# Patient Record
Sex: Male | Born: 1950 | Race: White | Hispanic: No | Smoking: Former smoker
Health system: Southern US, Community
[De-identification: ages and names within clinical notes are randomized; demographics above are authoritative.]

## PROBLEM LIST (undated history)

## (undated) DIAGNOSIS — I2510 Atherosclerotic heart disease of native coronary artery without angina pectoris: Secondary | ICD-10-CM

## (undated) DIAGNOSIS — M545 Low back pain, unspecified: Secondary | ICD-10-CM

## (undated) DIAGNOSIS — D751 Secondary polycythemia: Secondary | ICD-10-CM

## (undated) DIAGNOSIS — D471 Chronic myeloproliferative disease: Secondary | ICD-10-CM

## (undated) DIAGNOSIS — I10 Essential (primary) hypertension: Secondary | ICD-10-CM

## (undated) DIAGNOSIS — T7840XA Allergy, unspecified, initial encounter: Secondary | ICD-10-CM

## (undated) DIAGNOSIS — G8929 Other chronic pain: Secondary | ICD-10-CM

## (undated) DIAGNOSIS — M542 Cervicalgia: Secondary | ICD-10-CM

## (undated) DIAGNOSIS — G4733 Obstructive sleep apnea (adult) (pediatric): Secondary | ICD-10-CM

## (undated) DIAGNOSIS — M5030 Other cervical disc degeneration, unspecified cervical region: Secondary | ICD-10-CM

## (undated) DIAGNOSIS — I739 Peripheral vascular disease, unspecified: Secondary | ICD-10-CM

## (undated) HISTORY — DX: Other chronic pain: G89.29

## (undated) HISTORY — DX: Chronic myeloproliferative disease: D47.1

## (undated) HISTORY — PX: OTHER SURGICAL HISTORY: SHX169

## (undated) HISTORY — DX: Atherosclerotic heart disease of native coronary artery without angina pectoris: I25.10

## (undated) HISTORY — DX: Peripheral vascular disease, unspecified: I73.9

## (undated) HISTORY — DX: Secondary polycythemia: D75.1

## (undated) HISTORY — PX: JOINT REPLACEMENT: SHX530

## (undated) HISTORY — DX: Other cervical disc degeneration, unspecified cervical region: M50.30

## (undated) HISTORY — DX: Cervicalgia: M54.2

## (undated) HISTORY — DX: Essential (primary) hypertension: I10

## (undated) HISTORY — DX: Low back pain, unspecified: M54.50

## (undated) HISTORY — PX: SPINE SURGERY: SHX786

## (undated) HISTORY — DX: Low back pain: M54.5

## (undated) HISTORY — DX: Obstructive sleep apnea (adult) (pediatric): G47.33

## (undated) HISTORY — DX: Allergy, unspecified, initial encounter: T78.40XA

---

## 1999-08-22 ENCOUNTER — Encounter: Payer: Self-pay | Admitting: Emergency Medicine

## 1999-08-22 ENCOUNTER — Emergency Department (HOSPITAL_COMMUNITY): Admission: EM | Admit: 1999-08-22 | Discharge: 1999-08-22 | Payer: Self-pay | Admitting: Emergency Medicine

## 2006-09-21 ENCOUNTER — Encounter: Payer: Self-pay | Admitting: Cardiology

## 2006-09-27 ENCOUNTER — Encounter: Payer: Self-pay | Admitting: Cardiology

## 2006-09-28 ENCOUNTER — Encounter: Payer: Self-pay | Admitting: Cardiology

## 2006-10-14 HISTORY — PX: OTHER SURGICAL HISTORY: SHX169

## 2007-08-14 DIAGNOSIS — IMO0002 Reserved for concepts with insufficient information to code with codable children: Secondary | ICD-10-CM | POA: Insufficient documentation

## 2008-02-13 HISTORY — PX: OTHER SURGICAL HISTORY: SHX169

## 2008-02-19 ENCOUNTER — Encounter: Payer: Self-pay | Admitting: Cardiology

## 2008-03-21 ENCOUNTER — Ambulatory Visit: Payer: Self-pay | Admitting: Hematology and Oncology

## 2008-03-27 LAB — COMPREHENSIVE METABOLIC PANEL
ALT: 33 U/L (ref 0–53)
BUN: 17 mg/dL (ref 6–23)
CO2: 24 mEq/L (ref 19–32)
Calcium: 9 mg/dL (ref 8.4–10.5)
Chloride: 102 mEq/L (ref 96–112)
Creatinine, Ser: 1.03 mg/dL (ref 0.40–1.50)

## 2008-03-27 LAB — CBC WITH DIFFERENTIAL/PLATELET
Basophils Absolute: 0.1 10*3/uL (ref 0.0–0.1)
HCT: 54.2 % — ABNORMAL HIGH (ref 38.7–49.9)
HGB: 17.3 g/dL — ABNORMAL HIGH (ref 13.0–17.1)
MONO#: 0.5 10*3/uL (ref 0.1–0.9)
NEUT%: 79.9 % — ABNORMAL HIGH (ref 40.0–75.0)
WBC: 15 10*3/uL — ABNORMAL HIGH (ref 4.0–10.0)
lymph#: 1.8 10*3/uL (ref 0.9–3.3)

## 2008-03-27 LAB — LACTATE DEHYDROGENASE: LDH: 159 U/L (ref 94–250)

## 2008-04-17 LAB — CBC WITH DIFFERENTIAL/PLATELET
BASO%: 0.4 % (ref 0.0–2.0)
HCT: 49.9 % (ref 38.7–49.9)
MCHC: 33.2 g/dL (ref 32.0–35.9)
MONO#: 0.3 10*3/uL (ref 0.1–0.9)
RBC: 5.38 10*6/uL (ref 4.20–5.71)
WBC: 10.6 10*3/uL — ABNORMAL HIGH (ref 4.0–10.0)
lymph#: 1.4 10*3/uL (ref 0.9–3.3)

## 2008-04-18 ENCOUNTER — Encounter: Payer: Self-pay | Admitting: Internal Medicine

## 2008-05-27 ENCOUNTER — Ambulatory Visit: Payer: Self-pay | Admitting: Hematology and Oncology

## 2008-06-06 ENCOUNTER — Encounter: Payer: Self-pay | Admitting: Internal Medicine

## 2008-06-06 LAB — CBC WITH DIFFERENTIAL/PLATELET
BASO%: 0.5 % (ref 0.0–2.0)
LYMPH%: 22.5 % (ref 14.0–49.0)
MCHC: 33.8 g/dL (ref 32.0–36.0)
MONO#: 0.2 10*3/uL (ref 0.1–0.9)
MONO%: 3 % (ref 0.0–14.0)
Platelets: 225 10*3/uL (ref 140–400)
RBC: 3.74 10*6/uL — ABNORMAL LOW (ref 4.20–5.82)
RDW: 26.4 % — ABNORMAL HIGH (ref 11.0–14.6)
WBC: 6.1 10*3/uL (ref 4.0–10.3)

## 2008-06-06 LAB — CONVERTED CEMR LAB
ALT: 20 units/L
AST: 18 units/L
Albumin: 4.1 g/dL
BUN: 25 mg/dL
CO2: 25 meq/L
Calcium: 8.9 mg/dL
Chloride: 105 meq/L
Cholesterol: 139 mg/dL
Creatinine, Ser: 1 mg/dL
Glucose, Bld: 95 mg/dL
HDL: 35 mg/dL
LDL Cholesterol: 89 mg/dL
Potassium: 4.3 meq/L
Sodium: 136 meq/L
Total Bilirubin: 1.4 mg/dL
Total Protein: 6.2 g/dL
Triglyceride fasting, serum: 84 mg/dL

## 2008-07-22 ENCOUNTER — Ambulatory Visit: Payer: Self-pay | Admitting: Hematology and Oncology

## 2008-07-25 LAB — CBC WITH DIFFERENTIAL/PLATELET
EOS%: 2.9 % (ref 0.0–7.0)
Eosinophils Absolute: 0.2 10*3/uL (ref 0.0–0.5)
LYMPH%: 22.8 % (ref 14.0–49.0)
MCH: 42.4 pg — ABNORMAL HIGH (ref 27.2–33.4)
MCHC: 35 g/dL (ref 32.0–36.0)
MCV: 120.9 fL — ABNORMAL HIGH (ref 79.3–98.0)
MONO%: 2.4 % (ref 0.0–14.0)
Platelets: 254 10*3/uL (ref 140–400)
RBC: 2.65 10*6/uL — ABNORMAL LOW (ref 4.20–5.82)

## 2008-09-05 ENCOUNTER — Encounter: Payer: Self-pay | Admitting: Internal Medicine

## 2008-09-05 ENCOUNTER — Ambulatory Visit: Payer: Self-pay | Admitting: Hematology and Oncology

## 2008-09-05 LAB — CBC WITH DIFFERENTIAL/PLATELET
Basophils Absolute: 0.1 10*3/uL (ref 0.0–0.1)
EOS%: 2.8 % (ref 0.0–7.0)
Eosinophils Absolute: 0.2 10*3/uL (ref 0.0–0.5)
HGB: 12.7 g/dL — ABNORMAL LOW (ref 13.0–17.1)
LYMPH%: 23.1 % (ref 14.0–49.0)
MCH: 44.7 pg — ABNORMAL HIGH (ref 27.2–33.4)
MCV: 124.7 fL — ABNORMAL HIGH (ref 79.3–98.0)
MONO%: 3.5 % (ref 0.0–14.0)
NEUT#: 5 10*3/uL (ref 1.5–6.5)
NEUT%: 69.5 % (ref 39.0–75.0)
Platelets: 372 10*3/uL (ref 140–400)

## 2008-10-14 ENCOUNTER — Ambulatory Visit: Payer: Self-pay | Admitting: Hematology and Oncology

## 2008-10-29 DIAGNOSIS — M545 Low back pain: Secondary | ICD-10-CM

## 2008-10-30 ENCOUNTER — Ambulatory Visit: Payer: Self-pay | Admitting: Internal Medicine

## 2008-10-30 DIAGNOSIS — G4733 Obstructive sleep apnea (adult) (pediatric): Secondary | ICD-10-CM | POA: Insufficient documentation

## 2008-10-30 DIAGNOSIS — I10 Essential (primary) hypertension: Secondary | ICD-10-CM

## 2008-10-30 DIAGNOSIS — D751 Secondary polycythemia: Secondary | ICD-10-CM | POA: Insufficient documentation

## 2008-10-30 DIAGNOSIS — I2510 Atherosclerotic heart disease of native coronary artery without angina pectoris: Secondary | ICD-10-CM | POA: Insufficient documentation

## 2008-10-30 LAB — CBC WITH DIFFERENTIAL/PLATELET
Basophils Absolute: 0 10*3/uL (ref 0.0–0.1)
EOS%: 3.1 % (ref 0.0–7.0)
HGB: 13.6 g/dL (ref 13.0–17.1)
MCH: 40.2 pg — ABNORMAL HIGH (ref 27.2–33.4)
MCV: 119.4 fL — ABNORMAL HIGH (ref 79.3–98.0)
MONO%: 3.4 % (ref 0.0–14.0)
NEUT#: 6 10*3/uL (ref 1.5–6.5)
RBC: 3.38 10*6/uL — ABNORMAL LOW (ref 4.20–5.82)
RDW: 14.6 % (ref 11.0–14.6)
lymph#: 1.4 10*3/uL (ref 0.9–3.3)

## 2008-11-13 HISTORY — PX: US ECHOCARDIOGRAPHY: HXRAD669

## 2008-11-14 ENCOUNTER — Encounter: Payer: Self-pay | Admitting: Internal Medicine

## 2008-11-26 ENCOUNTER — Ambulatory Visit: Payer: Self-pay | Admitting: Cardiology

## 2008-11-26 ENCOUNTER — Telehealth (INDEPENDENT_AMBULATORY_CARE_PROVIDER_SITE_OTHER): Payer: Self-pay | Admitting: *Deleted

## 2008-11-26 ENCOUNTER — Ambulatory Visit: Payer: Self-pay | Admitting: Hematology and Oncology

## 2008-11-26 DIAGNOSIS — E785 Hyperlipidemia, unspecified: Secondary | ICD-10-CM

## 2008-12-06 ENCOUNTER — Ambulatory Visit: Payer: Self-pay

## 2008-12-06 ENCOUNTER — Ambulatory Visit: Payer: Self-pay | Admitting: Cardiology

## 2008-12-06 ENCOUNTER — Encounter: Payer: Self-pay | Admitting: Cardiology

## 2008-12-06 ENCOUNTER — Encounter: Payer: Self-pay | Admitting: Internal Medicine

## 2008-12-06 LAB — CBC WITH DIFFERENTIAL/PLATELET
BASO%: 0.1 % (ref 0.0–2.0)
Basophils Absolute: 0 10*3/uL (ref 0.0–0.1)
EOS%: 2.8 % (ref 0.0–7.0)
HCT: 41.6 % (ref 38.4–49.9)
HGB: 14.5 g/dL (ref 13.0–17.1)
LYMPH%: 18 % (ref 14.0–49.0)
MCH: 40.9 pg — ABNORMAL HIGH (ref 27.2–33.4)
MCHC: 34.9 g/dL (ref 32.0–36.0)
NEUT%: 75.4 % — ABNORMAL HIGH (ref 39.0–75.0)
Platelets: 288 10*3/uL (ref 140–400)

## 2008-12-06 LAB — BASIC METABOLIC PANEL
BUN: 15 mg/dL (ref 6–23)
CO2: 23 mEq/L (ref 19–32)
Calcium: 9.4 mg/dL (ref 8.4–10.5)
Creatinine, Ser: 0.84 mg/dL (ref 0.40–1.50)

## 2008-12-11 ENCOUNTER — Telehealth: Payer: Self-pay | Admitting: Cardiology

## 2008-12-11 LAB — CONVERTED CEMR LAB
BUN: 13 mg/dL (ref 6–23)
CO2: 27 meq/L (ref 19–32)
Calcium: 9.1 mg/dL (ref 8.4–10.5)
Chloride: 102 meq/L (ref 96–112)
Cholesterol: 171 mg/dL (ref 0–200)
Creatinine, Ser: 0.9 mg/dL (ref 0.4–1.5)
GFR calc non Af Amer: 91.76 mL/min (ref >60)
Glucose, Bld: 92 mg/dL (ref 70–99)
HDL: 40.5 mg/dL (ref >39.00)
LDL Cholesterol: 113 mg/dL — ABNORMAL HIGH (ref 0–99)
Potassium: 4.4 meq/L (ref 3.5–5.1)
Sodium: 136 meq/L (ref 135–145)
Total CHOL/HDL Ratio: 4
Triglycerides: 89 mg/dL (ref 0.0–149.0)
VLDL: 17.8 mg/dL (ref 0.0–40.0)

## 2009-01-06 ENCOUNTER — Telehealth: Payer: Self-pay | Admitting: Internal Medicine

## 2009-01-07 ENCOUNTER — Ambulatory Visit: Payer: Self-pay | Admitting: Hematology and Oncology

## 2009-01-07 LAB — CBC WITH DIFFERENTIAL/PLATELET
Basophils Absolute: 0.1 10*3/uL (ref 0.0–0.1)
Eosinophils Absolute: 0.2 10*3/uL (ref 0.0–0.5)
HCT: 46.9 % (ref 38.4–49.9)
HGB: 15.9 g/dL (ref 13.0–17.1)
LYMPH%: 10.1 % — ABNORMAL LOW (ref 14.0–49.0)
MCHC: 33.8 g/dL (ref 32.0–36.0)
MONO#: 0.5 10*3/uL (ref 0.1–0.9)
NEUT#: 13.3 10*3/uL — ABNORMAL HIGH (ref 1.5–6.5)
NEUT%: 85.1 % — ABNORMAL HIGH (ref 39.0–75.0)
Platelets: 435 10*3/uL — ABNORMAL HIGH (ref 140–400)
WBC: 15.6 10*3/uL — ABNORMAL HIGH (ref 4.0–10.3)

## 2009-01-30 ENCOUNTER — Ambulatory Visit: Payer: Self-pay | Admitting: Internal Medicine

## 2009-01-30 LAB — CBC WITH DIFFERENTIAL/PLATELET
Eosinophils Absolute: 0.2 10*3/uL (ref 0.0–0.5)
LYMPH%: 15.3 % (ref 14.0–49.0)
MONO#: 0.2 10*3/uL (ref 0.1–0.9)
NEUT#: 8.2 10*3/uL — ABNORMAL HIGH (ref 1.5–6.5)
Platelets: 485 10*3/uL — ABNORMAL HIGH (ref 140–400)
RBC: 3.66 10*6/uL — ABNORMAL LOW (ref 4.20–5.82)
WBC: 10.1 10*3/uL (ref 4.0–10.3)
lymph#: 1.6 10*3/uL (ref 0.9–3.3)

## 2009-02-03 ENCOUNTER — Telehealth: Payer: Self-pay | Admitting: Internal Medicine

## 2009-02-14 ENCOUNTER — Telehealth: Payer: Self-pay | Admitting: Internal Medicine

## 2009-02-28 ENCOUNTER — Ambulatory Visit: Payer: Self-pay | Admitting: Hematology and Oncology

## 2009-02-28 LAB — CBC WITH DIFFERENTIAL/PLATELET
BASO%: 0.2 % (ref 0.0–2.0)
MCHC: 34.3 g/dL (ref 32.0–36.0)
MONO#: 0.4 10*3/uL (ref 0.1–0.9)
NEUT#: 7 10*3/uL — ABNORMAL HIGH (ref 1.5–6.5)
RBC: 3.93 10*6/uL — ABNORMAL LOW (ref 4.20–5.82)
RDW: 16 % — ABNORMAL HIGH (ref 11.0–14.6)
WBC: 8.9 10*3/uL (ref 4.0–10.3)
lymph#: 1.3 10*3/uL (ref 0.9–3.3)

## 2009-04-01 ENCOUNTER — Telehealth: Payer: Self-pay | Admitting: Internal Medicine

## 2009-04-09 ENCOUNTER — Ambulatory Visit: Payer: Self-pay | Admitting: Hematology and Oncology

## 2009-04-28 ENCOUNTER — Telehealth: Payer: Self-pay | Admitting: Internal Medicine

## 2009-04-30 ENCOUNTER — Ambulatory Visit: Payer: Self-pay | Admitting: Internal Medicine

## 2009-04-30 LAB — COMPREHENSIVE METABOLIC PANEL
ALT: 19 U/L (ref 0–53)
Albumin: 4.2 g/dL (ref 3.5–5.2)
CO2: 24 mEq/L (ref 19–32)
Calcium: 9 mg/dL (ref 8.4–10.5)
Chloride: 102 mEq/L (ref 96–112)
Creatinine, Ser: 0.87 mg/dL (ref 0.40–1.50)
Potassium: 3.8 mEq/L (ref 3.5–5.3)
Sodium: 138 mEq/L (ref 135–145)
Total Protein: 6.3 g/dL (ref 6.0–8.3)

## 2009-04-30 LAB — CBC WITH DIFFERENTIAL/PLATELET
BASO%: 0.4 % (ref 0.0–2.0)
HCT: 39.4 % (ref 38.4–49.9)
HGB: 13.6 g/dL (ref 13.0–17.1)
MCHC: 34.5 g/dL (ref 32.0–36.0)
MONO#: 0.1 10*3/uL (ref 0.1–0.9)
NEUT%: 73 % (ref 39.0–75.0)
RDW: 15.5 % — ABNORMAL HIGH (ref 11.0–14.6)
WBC: 4.8 10*3/uL (ref 4.0–10.3)
lymph#: 1 10*3/uL (ref 0.9–3.3)

## 2009-07-29 ENCOUNTER — Ambulatory Visit: Payer: Self-pay | Admitting: Hematology and Oncology

## 2009-07-30 ENCOUNTER — Ambulatory Visit: Payer: Self-pay | Admitting: Internal Medicine

## 2009-07-30 LAB — CONVERTED CEMR LAB
BUN: 21 mg/dL (ref 6–23)
Basophils Absolute: 0 10*3/uL (ref 0.0–0.1)
Basophils Relative: 0.3 % (ref 0.0–3.0)
CO2: 30 meq/L (ref 19–32)
Calcium: 9.6 mg/dL (ref 8.4–10.5)
Chloride: 104 meq/L (ref 96–112)
Cholesterol: 179 mg/dL (ref 0–200)
Creatinine, Ser: 0.8 mg/dL (ref 0.4–1.5)
Eosinophils Absolute: 0.2 10*3/uL (ref 0.0–0.7)
Eosinophils Relative: 3 % (ref 0.0–5.0)
GFR calc non Af Amer: 101.94 mL/min (ref >60)
Glucose, Bld: 94 mg/dL (ref 70–99)
HCT: 41.5 % (ref 39.0–52.0)
HDL: 38.8 mg/dL — ABNORMAL LOW (ref >39.00)
Hemoglobin: 14.3 g/dL (ref 13.0–17.0)
LDL Cholesterol: 125 mg/dL — ABNORMAL HIGH (ref 0–99)
Lymphocytes Relative: 19.1 % (ref 12.0–46.0)
Lymphs Abs: 1.4 10*3/uL (ref 0.7–4.0)
MCHC: 34.3 g/dL (ref 30.0–36.0)
MCV: 118.3 fL — ABNORMAL HIGH (ref 78.0–100.0)
Monocytes Absolute: 0.3 10*3/uL (ref 0.1–1.0)
Monocytes Relative: 3.8 % (ref 3.0–12.0)
Neutro Abs: 5.2 10*3/uL (ref 1.4–7.7)
Neutrophils Relative %: 73.8 % (ref 43.0–77.0)
Platelets: 353 10*3/uL (ref 150.0–400.0)
Potassium: 5.2 meq/L — ABNORMAL HIGH (ref 3.5–5.1)
RBC: 3.51 M/uL — ABNORMAL LOW (ref 4.22–5.81)
RDW: 14.9 % — ABNORMAL HIGH (ref 11.5–14.6)
Sodium: 141 meq/L (ref 135–145)
TSH: 2.01 microintl units/mL (ref 0.35–5.50)
Total CHOL/HDL Ratio: 5
Triglycerides: 76 mg/dL (ref 0.0–149.0)
VLDL: 15.2 mg/dL (ref 0.0–40.0)
WBC: 7.1 10*3/uL (ref 4.5–10.5)

## 2009-07-30 LAB — CBC WITH DIFFERENTIAL/PLATELET
BASO%: 0.1 % (ref 0.0–2.0)
LYMPH%: 19.8 % (ref 14.0–49.0)
MCHC: 34.3 g/dL (ref 32.0–36.0)
MONO#: 0.2 10*3/uL (ref 0.1–0.9)
RBC: 3.6 10*6/uL — ABNORMAL LOW (ref 4.20–5.82)
WBC: 6.1 10*3/uL (ref 4.0–10.3)
lymph#: 1.2 10*3/uL (ref 0.9–3.3)

## 2009-08-01 ENCOUNTER — Telehealth: Payer: Self-pay | Admitting: Internal Medicine

## 2009-08-05 ENCOUNTER — Telehealth: Payer: Self-pay | Admitting: Internal Medicine

## 2009-11-03 ENCOUNTER — Ambulatory Visit: Payer: Self-pay | Admitting: Hematology and Oncology

## 2009-11-05 LAB — BASIC METABOLIC PANEL
BUN: 13 mg/dL (ref 6–23)
Calcium: 9.2 mg/dL (ref 8.4–10.5)
Glucose, Bld: 94 mg/dL (ref 70–99)

## 2009-11-05 LAB — CBC WITH DIFFERENTIAL/PLATELET
Basophils Absolute: 0 10*3/uL (ref 0.0–0.1)
Eosinophils Absolute: 0.3 10*3/uL (ref 0.0–0.5)
HGB: 15.5 g/dL (ref 13.0–17.1)
LYMPH%: 16.6 % (ref 14.0–49.0)
MCV: 112.1 fL — ABNORMAL HIGH (ref 79.3–98.0)
MONO%: 3.7 % (ref 0.0–14.0)
NEUT#: 5.8 10*3/uL (ref 1.5–6.5)
Platelets: 329 10*3/uL (ref 140–400)

## 2009-12-23 ENCOUNTER — Encounter: Payer: Self-pay | Admitting: Internal Medicine

## 2009-12-30 ENCOUNTER — Ambulatory Visit: Payer: Self-pay | Admitting: Cardiology

## 2009-12-30 DIAGNOSIS — I739 Peripheral vascular disease, unspecified: Secondary | ICD-10-CM

## 2010-01-01 ENCOUNTER — Telehealth: Payer: Self-pay | Admitting: Internal Medicine

## 2010-01-01 ENCOUNTER — Ambulatory Visit: Payer: Self-pay | Admitting: Cardiology

## 2010-01-07 ENCOUNTER — Encounter: Payer: Self-pay | Admitting: Internal Medicine

## 2010-01-13 ENCOUNTER — Encounter: Payer: Self-pay | Admitting: Internal Medicine

## 2010-01-15 ENCOUNTER — Encounter: Payer: Self-pay | Admitting: Internal Medicine

## 2010-01-27 ENCOUNTER — Ambulatory Visit: Payer: Self-pay | Admitting: Hematology and Oncology

## 2010-01-28 ENCOUNTER — Ambulatory Visit: Payer: Self-pay | Admitting: Internal Medicine

## 2010-01-28 ENCOUNTER — Ambulatory Visit: Payer: Self-pay

## 2010-01-28 ENCOUNTER — Encounter: Payer: Self-pay | Admitting: Cardiology

## 2010-01-28 DIAGNOSIS — IMO0002 Reserved for concepts with insufficient information to code with codable children: Secondary | ICD-10-CM | POA: Insufficient documentation

## 2010-01-28 DIAGNOSIS — N529 Male erectile dysfunction, unspecified: Secondary | ICD-10-CM | POA: Insufficient documentation

## 2010-01-28 LAB — CBC WITH DIFFERENTIAL/PLATELET
BASO%: 0.3 % (ref 0.0–2.0)
EOS%: 2.5 % (ref 0.0–7.0)
MCH: 38.5 pg — ABNORMAL HIGH (ref 27.2–33.4)
MCHC: 34.2 g/dL (ref 32.0–36.0)
MONO#: 0.3 10*3/uL (ref 0.1–0.9)
RBC: 3.97 10*6/uL — ABNORMAL LOW (ref 4.20–5.82)
RDW: 15.2 % — ABNORMAL HIGH (ref 11.0–14.6)
WBC: 7.2 10*3/uL (ref 4.0–10.3)
lymph#: 1.2 10*3/uL (ref 0.9–3.3)
nRBC: 0 % (ref 0–0)

## 2010-02-02 LAB — CONVERTED CEMR LAB
Cholesterol: 143 mg/dL (ref 0–200)
HDL: 40.8 mg/dL (ref >39.00)
LDL Cholesterol: 94 mg/dL (ref 0–99)
Total CHOL/HDL Ratio: 4
Triglycerides: 39 mg/dL (ref 0.0–149.0)
VLDL: 7.8 mg/dL (ref 0.0–40.0)

## 2010-02-03 ENCOUNTER — Telehealth: Payer: Self-pay | Admitting: Cardiology

## 2010-03-25 ENCOUNTER — Telehealth: Payer: Self-pay | Admitting: Internal Medicine

## 2010-03-26 ENCOUNTER — Encounter: Payer: Self-pay | Admitting: Internal Medicine

## 2010-04-07 ENCOUNTER — Encounter: Payer: Self-pay | Admitting: Internal Medicine

## 2010-04-16 NOTE — Progress Notes (Signed)
  Phone Note Refill Request  on April 28, 2009 8:50 AM  Refills Requested: Medication #1:  MECLIZINE HCL 12.5 MG TABS 1 -2 by mouth three times a day as needed for dizziness.   Dosage confirmed as above?Dosage Confirmed   Notes: [facility] Initial call taken by: Scharlene Gloss,  April 28, 2009 8:50 AM    Prescriptions: MECLIZINE HCL 12.5 MG TABS (MECLIZINE HCL) 1 -2 by mouth three times a day as needed for dizziness  #50 x 1   Entered by:   Scharlene Gloss   Authorized by:   Corwin Levins MD   Signed by:   Scharlene Gloss on 04/28/2009   Method used:   Faxed to ...       [facility] (retail)       [address] [place]       [address]       [place], [place]  [address]       Ph: [phone]       Fax: [phone]   RxID:   [id number]

## 2010-04-16 NOTE — Letter (Signed)
Summary: [address]   Imported By: Sherian Rein 04/10/2010 10:10:19  _____________________________________________________________________  External Attachment:    Type:   Image     Comment:   External Document

## 2010-04-16 NOTE — Letter (Signed)
Summary: CMN/[facility]  CMN/[facility]   Imported By: Lester [place] 03/31/2010 07:03:25  _____________________________________________________________________  External Attachment:    Type:   Image     Comment:   External Document

## 2010-04-16 NOTE — Letter (Signed)
Summary: CMN for CPAP Supplies/[facility]  CMN for CPAP Supplies/[facility]   Imported By: Sherian Rein 01/15/2010 07:34:41  _____________________________________________________________________  External Attachment:    Type:   Image     Comment:   External Document

## 2010-04-16 NOTE — Assessment & Plan Note (Signed)
Summary: PER CHECKOUT/SF   Visit Type:  Follow-up Primary [address]:  Fernando Lukes MD   History of Present Illness: 60 yo with history of HTN, OSA, obesity presents for cardiology followup.  He has had no cardiac problems since I last saw him.  No chest pain.  He gets pain in his left shoulder at times when using his left arm but thinks this is arthritis.  No significant exertional dyspnea but exertion is limited by knee and back pain.  He can get up a flight of steps but it does bother his knee a lot.  Patient also reports that his feet feel cold all the time.  After last appointment in 9/10, we did an echo, showing normal LV systolic function and no significant valvular abnormalities.   ECG:  NSR, normal  Labs (3/10): creatinine 1.0, LDL 89 Labs (5/11): creatinine 0.8, LDL 125, HDL 39, TSH normal  Current Medications (verified): 1)  Bayer Low Strength 81 Mg Tbec (Aspirin) .Marland Kitchen.. 1 Tab Daily 2)  Ultram 50 Mg Tabs (Tramadol Hcl) .Marland Kitchen.. 1 By Mouth Every 6 Hours As Needed For Moderate Pain 3)  Hydrea 500 Mg Caps (Hydroxyurea) .Marland Kitchen.. 1 Capsule By Mouth Every Other Day Alternating With 2 Capsule By Mouth Every Other Day 4)  Naprosyn 500 Mg Tabs (Naproxen) .Marland Kitchen.. 1 Tab Qam 5)  Toprol Xl 25 Mg Xr24h-Tab (Metoprolol Succinate) .Marland Kitchen.. 1 Tab By Mouth  Every Morning 6)  Lisinopril-Hydrochlorothiazide 20-12.5 Mg Tabs (Lisinopril-Hydrochlorothiazide) .Marland Kitchen.. 1 By Mouth Once Daily 7)  Lortab 7.5 7.5-500 Mg Tabs (Hydrocodone-Acetaminophen) .Marland Kitchen.. 1 Tab By Mouth Q 6 Hours As Needed 8)  Meclizine Hcl 12.5 Mg Tabs (Meclizine Hcl) .Marland Kitchen.. 1 -2 By Mouth Three Times A Day As Needed For Dizziness  Allergies (verified): No Known Drug Allergies  Past History:  Past Medical History: 1. Low back pain 2. HTN  3. OSA - CPAP at bedtime 4. 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 Stress myoview (7/08) in [place], [place]: small  reversible inferior perfusion defect.  EF 65%.  Possible attenuation.  7. Chronic neck pain with history of c-spine surgery. 8. Obesity.  9. Lower extremity doppler US 2008: no evidence for PAD.  10.  Echo (9/10): EF 55-60%, normal valves, mild LAE  MD rooster: heme -odogwu  NSurg -[facility]  Family History: Reviewed history from 11/26/2008 and no changes required. mom - died late 5s dad - died late 63s or early 69s due to massive MI  no cancer hx  Social History: Reviewed history from 11/26/2008 and no changes required. lives alone (divorced).  daughter lives in town. disabled  former management/construction quit cigars/chew - 1990 quit cigarettes 1970s  Review of Systems       All systems reviewed and negative except as per HPI.   Vital Signs:  Patient profile:   60 year old male Height:      65 inches Weight:      253 pounds BMI:     42.25 Pulse rate:   67 / minute BP sitting:   118 / 72  (right arm)  Vitals Entered By: Laurance Flatten CMA (December 30, 2009 8:20 AM)  Physical Exam  General:  Well developed, well nourished, in no acute distress.  Obese.  Neck:  Neck thick, no JVD. No masses, thyromegaly or abnormal cervical nodes. Lungs:  Clear bilaterally to auscultation and percussion. Heart:  Non-displaced PMI, chest non-tender; regular rate and rhythm, S1, S2  without murmurs, rubs or gallops. Carotid upstroke normal, no bruit. Pedals difficult to palpate. No edema, no varicosities. Abdomen:  Bowel sounds positive; abdomen soft and non-tender without masses, organomegaly, or hernias noted. No hepatosplenomegaly. Extremities:  No clubbing or cyanosis. Neurologic:  Alert and oriented x 3. Psych:  Normal affect.   Impression & Recommendations:  Problem # 1:  HYPERLIPIDEMIA-MIXED (ICD-272.4) LDL 125.  Given family history of premature CAD, think it would be reasonable to further risk stratify him.  Will get a Lipomed profile to look more closely at his lipids.     Problem # 2:  PERIPHERAL VASCULAR DISEASE (ICD-443.9) Feet feel cold "all the time."  Difficult to palpate pedal pulses.  Will get arterial dopplers to screen for PAD.   Problem # 3:  CAD RISK Myoview with small reversible inferior defect in 2008 may have been due to attenuation.  He does have risk factors including a family history of premature CAD.  Continue ASA 81 mg daily.   Problem # 4:  HYPERTENSION (ICD-401.9) BP under good control today.   Other Orders: EKG w/ Interpretation (93000) Arterial Duplex Lower Extremity (Arterial Duplex Low)  Patient Instructions: 1)  Your physician recommends that you schedule a follow-up appointment in: 1 year 2)  You have been referred to--please call dr Bayard Hugger at [facility] and have them add lipo-profile to blood to be drawn thursday 10/20 at 800am 3)  Your physician has requested that you have a lower or upper extremity arterial duplex.  This test is an ultrasound of the arteries in the legs or arms.  It looks at arterial blood flow in the legs and arms.  Allow one hour for Lower and Upper Arterial scans. There are no restrictions or special instructions.

## 2010-04-16 NOTE — Letter (Signed)
Summary: LMN for CPAP Compliance/[facility]  LMN for CPAP Compliance/[facility]   Imported By: Sherian Rein 12/24/2009 09:30:45  _____________________________________________________________________  External Attachment:    Type:   Image     Comment:   External Document

## 2010-04-16 NOTE — Assessment & Plan Note (Signed)
Summary: 3 mos f/u #/cd   Vital Signs:  Patient profile:   61 year old male Height:      65 inches (165.10 cm) Weight:      250.12 pounds (113.69 kg) O2 Sat:      97 % on Room air Temp:     97.7 degrees F (36.50 degrees C) oral Pulse rate:   66 / minute BP sitting:   122 / 76  (left arm) Cuff size:   regular  Vitals Entered By: Orlan Leavens (Jul 30, 2009 8:36 AM)  O2 Flow:  Room air CC: 3 month follow-up Is Patient Diabetic? No Pain Assessment Patient in pain? no        Primary Care Provider:  Newt Lukes MD  CC:  3 month follow-up.  History of Present Illness: here for 3 month f/u:  HTN -  reports compliance with ongoing medical treatment and no changes in medication dose or frequency. denies adverse side effects related to current therapy.  still feels occ dizzy and reports BP variable readings at home - uses meclizine for same no syncope  low back pain, chronic - worse symptoms with notable weight gain in past 3 months (>15lbs!)  pain better s/p ESI by dr. Venetia Maxon fall 2010 - but no f/u since due to loss of insurance going back for opinion about neck and NCS once medicare card effective med use of lortab and ultram reviewed - requests 3 mo supply to hold until next OV  OSA - still on CPAP without change - sleeping reasonable well - no snoring or change in sleep -  PCV - taking hydroxyurea without change - no mucosal bleeding or rectal bleeding - no f/c or weight loss - labs done every 3 weeks (to tx) and OV q 6 mo   Clinical Review Panels:  Lipid Management   Cholesterol:  171 (12/06/2008)   LDL (bad choesterol):  113 (12/06/2008)   HDL (good cholesterol):  40.50 (12/06/2008)   Triglycerides:  84 (06/06/2008)  Complete Metabolic Panel   Glucose:  92 (12/06/2008)   Sodium:  136 (12/06/2008)   Potassium:  4.4 (12/06/2008)   Chloride:  102 (12/06/2008)   CO2:  27 (12/06/2008)   BUN:  13 (12/06/2008)   Creatinine:  0.9 (12/06/2008)   Albumin:   4.1 (06/06/2008)   Total Protein:  6.2 (06/06/2008)   Calcium:  9.1 (12/06/2008)   Total Bili:  1.4 (06/06/2008)   SGPT (ALT):  20 (06/06/2008)   SGOT (AST):  18 (06/06/2008)   Current Medications (verified): 1)  Bayer Low Strength 81 Mg Tbec (Aspirin) .Marland Kitchen.. 1 Tab Daily 2)  Ultram 50 Mg Tabs (Tramadol Hcl) .Marland Kitchen.. 1 By Mouth Every 6 Hours As Needed For Moderate Pain 3)  Hydrea 500 Mg Caps (Hydroxyurea) .Marland Kitchen.. 1 Capsule By Mouth Every Other Day Alternating With 2 Capsule By Mouth Every Other Day 4)  Naprosyn 500 Mg Tabs (Naproxen) .Marland Kitchen.. 1 Tab Qam 5)  Toprol Xl 25 Mg Xr24h-Tab (Metoprolol Succinate) .Marland Kitchen.. 1 Tab By Mouth  Every Morning 6)  Lisinopril-Hydrochlorothiazide 20-12.5 Mg Tabs (Lisinopril-Hydrochlorothiazide) .Marland Kitchen.. 1 By Mouth Once Daily 7)  Lortab 7.5 7.5-500 Mg Tabs (Hydrocodone-Acetaminophen) .Marland Kitchen.. 1 Tab By Mouth Q 6 Hours As Needed 8)  Meclizine Hcl 12.5 Mg Tabs (Meclizine Hcl) .Marland Kitchen.. 1 -2 By Mouth Three Times A Day As Needed For Dizziness  Allergies (verified): No Known Drug Allergies  Past History:  Past Medical History: 1. Low back pain 2. HTN  3. OSA -  CPAP at bedtime 4. 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 Nuclear stress test in [facility] in 1/10.  Per patient, study was normal.  7. Chronic neck pain with history of c-spine surgery. 8. Obesity.  9. Lower extremity doppler US 2008: no evidence for PAD.   MD rooster: heme -odogwu  NSurg -[facility]  Review of Systems       The patient complains of weight gain.  The patient denies chest pain, syncope, peripheral edema, headaches, and abdominal pain.    Physical Exam  General:  overweight-appearing.  alert, well-developed, well-nourished, and cooperative to examination.    Lungs:  normal respiratory effort, no intercostal retractions or use of accessory muscles; normal breath sounds bilaterally - no crackles and no wheezes.      Heart:  distant but normal rate, regular rhythm, no murmur, and no rub. BLE without edema.  Psych:  Oriented X3, memory intact for recent and remote, normally interactive, good eye contact, not anxious appearing, not depressed appearing, and not agitated.      Impression & Recommendations:  Problem # 1:  HYPERLIPIDEMIA-MIXED (ICD-272.4)  recheck labs today - expect may be up with noted weight gain - reminded of need to maiantain diet and weight control Labs Reviewed: SGOT: 18 (06/06/2008)   SGPT: 20 (06/06/2008)   HDL:40.50 (12/06/2008), 35 (06/06/2008)  LDL:113 (12/06/2008), 89 (16/12/9602)  Chol:171 (12/06/2008), 139 (06/06/2008)  Trig:89.0 (12/06/2008), 84 (06/06/2008)  Orders: TLB-Lipid Panel (80061-LIPID)  Problem # 2:  HYPERTENSION (ICD-401.9)  His updated medication list for this problem includes:    Toprol Xl 25 Mg Xr24h-tab (Metoprolol succinate) .Marland Kitchen... 1 tab by mouth  every morning    Lisinopril-hydrochlorothiazide 20-12.5 Mg Tabs (Lisinopril-hydrochlorothiazide) .Marland Kitchen... 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 1 tab daily    Ultram 50 Mg Tabs (Tramadol hcl) .Marland Kitchen... 1 by mouth every 6 hours as needed for moderate pain    Naprosyn 500 Mg Tabs (Naproxen) .Marland Kitchen... 1 tab qam    Lortab 7.5 7.5-500 Mg Tabs (Hydrocodone-acetaminophen) .Marland Kitchen... 1 tab by mouth q 6 hours as needed  will f/u with [facility] when medicare card "kicks in"  Complete Medication List: 1)  Bayer Low Strength 81 Mg Tbec (Aspirin) .Marland Kitchen.. 1 tab daily 2)  Ultram 50 Mg Tabs (Tramadol hcl) .Marland Kitchen.. 1 by mouth every 6 hours as needed for moderate pain 3)  Hydrea 500 Mg Caps (Hydroxyurea) .Marland Kitchen.. 1 capsule by mouth every other day alternating  with 2 capsule by mouth every other day 4)  Naprosyn 500 Mg Tabs (Naproxen) .Marland Kitchen.. 1 tab qam 5)  Toprol Xl 25 Mg Xr24h-tab (Metoprolol succinate) .Marland Kitchen.. 1 tab by mouth  every morning 6)  Lisinopril-hydrochlorothiazide 20-12.5 Mg Tabs (Lisinopril-hydrochlorothiazide) .Marland Kitchen.. 1 by mouth once daily 7)  Lortab 7.5 7.5-500 Mg Tabs (Hydrocodone-acetaminophen) .Marland Kitchen.. 1 tab by mouth q 6 hours as needed 8)  Meclizine Hcl 12.5 Mg Tabs (Meclizine hcl) .Marland Kitchen.. 1 -2 by mouth three times a day as needed for dizziness  Patient Instructions: 1)  it was good to see you today. 2)  test(s) ordered today - your results will be posted on the phone tree for review in 48-72 hours from the time of test completion; call [phone] and enter your 9 digit MRN (listed above on this page, just below your name); if any changes need to be made or there are abnormal results, you will be contacted directly.  3)  refill on medications as discussed - 4)  it is important that you work on losing weight - monitor your diet and consume fewer calories such as less carbohydrates (sugar) and less fat. you also need to increase your physical activity level - start by walking for 10-20 minutes 3 times per week and work up to 30 minutes 4-5 times each week. 5)  Please schedule a follow-up  appointment in 4 months, sooner if problems.  Prescriptions: LORTAB 7.5 7.5-500 MG TABS (HYDROCODONE-ACETAMINOPHEN) 1 tab by mouth q 6 hours as needed  #90 x 1   Entered and Authorized by:   Newt Lukes MD   Signed by:   Newt Lukes MD on 07/30/2009   Method used:   Print then Give to Patient   RxID:   [id number]

## 2010-04-16 NOTE — Progress Notes (Signed)
Summary: PT RTN YOUR CALL  Phone Note Call from Patient Call back at Home Phone [phone]   Caller: Patient Reason for Call: Talk to Nurse, Talk to Doctor Summary of Call: PT RTN YOUR CALL Initial call taken by: Omer Jack,  February 03, 2010 11:38 AM  Follow-up for Phone Call        I talked with pt yesterday about recent lipid panel--LMTCB Katina Dung, RN, BSN  February 03, 2010 11:54 AM   pt returned call -pls call [phone] Glynda Jaeger  February 03, 2010 11:57 AM--I talked with pt

## 2010-04-16 NOTE — Progress Notes (Signed)
Summary: Rx replacement  Phone Note Call from Patient Call back at Home Phone [phone]   Caller: Patient Summary of Call: pt called stating that his Rx for Lortab has been lost. I advised pt that he would need to report prescription lost and provide office with report if VAL would replace Rx. Pt then stated that he has [employer] transportation and it would very difficult to get to [employer]. After discussing with VAL, MD decided to allow replacement of Rx this thime ONLY and if this happened again, MD will NOT replace. Pt advised of same. Rx telephoned to pharmacy per pt request.  Initial call taken by: Margaret Pyle, CMA,  Aug 01, 2009 10:40 AM    Prescriptions: LORTAB 7.5 7.5-500 MG TABS (HYDROCODONE-ACETAMINOPHEN) 1 tab by mouth q 6 hours as needed  #90 x 1   Entered and Authorized by:   Margaret Pyle, CMA   Signed by:   Margaret Pyle, CMA on 08/01/2009   Method used:   Telephoned to ...       [facility]       [address]       [place], [place]  [address]       Ph: [phone]       Fax: [phone]   RxID:   [id number]

## 2010-04-16 NOTE — Progress Notes (Signed)
Summary: Rf Requests  Phone Note Call from Patient   Caller: Patient Summary of Call: pt is requesting Rf on Tramadol and Hydrocodone.  He states he had to R/S his appt to 01-28-2010.  Please advise on RF Initial call taken by: Lanier Prude, [facility]),  January 01, 2010 11:31 AM  Follow-up for Phone Call        ok to fill as prev rx'd - thanks Follow-up by: Newt Lukes MD,  January 01, 2010 12:30 PM    Prescriptions: LORTAB 7.5 7.5-500 MG TABS (HYDROCODONE-ACETAMINOPHEN) 1 tab by mouth q 6 hours as needed  #90 x 0   Entered by:   Lanier Prude, CMA(AAMA)   Authorized by:   Newt Lukes MD   Signed by:   Lanier Prude, CMA(AAMA) on 01/01/2010   Method used:   Telephoned to ...       [facility]       [address]       [place], [place]  [address]       Ph: [phone]       Fax: [phone]   RxID:   [id number] ULTRAM 50 MG TABS (TRAMADOL HCL) 1 by mouth every 6 hours as needed for moderate pain  #120 Each x 2   Entered by:   Lanier Prude, CMA(AAMA)   Authorized by:   Newt Lukes MD   Signed by:   Lanier Prude, CMA(AAMA) on 01/01/2010   Method used:   Telephoned to ...       [facility]       [address]       [place], [place]  [address]       Ph: [phone]       Fax: [phone]   RxID:   [id number]

## 2010-04-16 NOTE — Progress Notes (Signed)
Summary: walker req  Phone Note Call from Patient   Caller: Patient [phone] Summary of Call: Pt called requesting order for new walker, previous is broken. Pt is requesting a "Rollator with four wheels, brake handles and seat" to [employer]. Okay to generate order? Initial call taken by: Margaret Pyle, CMA,  March 25, 2010 1:56 PM  Follow-up for Phone Call        yes - thx -Newt Lukes MD  March 25, 2010 3:51 PM     New/Updated Medications: * ROLLATOR WALKER WITH 4 WHEELS, HAND BRAKES AND A SEAT use as directed Prescriptions: ROLLATOR WALKER WITH 4 WHEELS, HAND BRAKES AND A SEAT use as directed  #1 x 0   Entered by:   Margaret Pyle, CMA   Authorized by:   Newt Lukes MD   Signed by:   Margaret Pyle, CMA on 03/25/2010   Method used:   Faxed to ...       [facility] (retail)       [address]       [place], [place]  [address]       Ph: [phone]       Fax: [phone]   RxID:   [id number]

## 2010-04-16 NOTE — Letter (Signed)
Summary: CMN for CPAP Supplies/[facility]  CMN for CPAP Supplies/[facility]   Imported By: Sherian Rein 01/19/2010 09:25:03  _____________________________________________________________________  External Attachment:    Type:   Image     Comment:   External Document

## 2010-04-16 NOTE — Progress Notes (Signed)
Summary: pt request  Phone Note Call from Patient Call back at Home Phone [phone]   Caller: Patient Summary of Call: pt called requesting a Rx for Naproxen 500mg  be sent to [facility] pharmacy Initial call taken by: Margaret Pyle, CMA,  April 01, 2009 3:09 PM    Prescriptions: NAPROSYN 500 MG TABS (NAPROXEN) 1 tab qam  #30 x 10   Entered by:   Margaret Pyle, CMA   Authorized by:   Newt Lukes MD   Signed by:   Margaret Pyle, CMA on 04/01/2009   Method used:   Faxed to ...       [facility] (retail)       [address] [place]       [address]       [place], [place]  [address]       Ph: [phone]       Fax: [phone]   RxID:   [id number]

## 2010-04-16 NOTE — Assessment & Plan Note (Signed)
Summary: 3 MTH FU  STC   Vital Signs:  Patient profile:   60 year old Lane Height:      65 inches (165.10 cm) Weight:      232.8 pounds (105.82 kg) O2 Sat:      98 % on Room air Temp:     98.2 degrees F (36.78 degrees C) oral Pulse rate:   55 / minute BP sitting:   120 / 72  (left arm) Cuff size:   large  Vitals Entered By: Orlan Leavens (April 30, 2009 8:39 AM)  O2 Flow:  Room air CC: 3 month follow-up/ Need refills on all med pt want rx's printed out Is Patient Diabetic? No Pain Assessment Patient in pain? no        Primary Care Provider:  Newt Lukes MD  CC:  3 month follow-up/ Need refills on all med pt want rx's printed out.  History of Present Illness: here for 3 month f/u:  HTN -  reports compliance with ongoing medical treatment and no changes in medication dose or frequency. denies adverse side effects related to current therapy.  still feels occ dizzy and reports BP variable readings at home - uses meclizine for same no syncope  low back pain -  locally better s/p ESI by dr. Venetia Maxon last fall - but no f/u since due to loss of insurance unsure about PT needs going back for opinion about neck and  NCS once medicare card effective med use of lortab and ultram reviewed - requests 3 mo supply to hold until next OV  OSA - still on CPAP without change - sleeping reasonable well -  PCV - due for heme f/u today - taking hydroxyurea without change - +inc spont bruising on B forearms - deneis trauma - no mucosal bleeding or rectal bleeding - no f/c or weight changes - labs done yest for heme-- ?results   Clinical Review Panels:  Lipid Management   Cholesterol:  171 (12/06/2008)   LDL (bad choesterol):  113 (12/06/2008)   HDL (good cholesterol):  40.50 (12/06/2008)   Triglycerides:  84 (06/06/2008)  Complete Metabolic Panel   Glucose:  92 (12/06/2008)   Sodium:  136 (12/06/2008)   Potassium:  4.4 (12/06/2008)   Chloride:  102 (12/06/2008)  CO2:  27 (12/06/2008)   BUN:  13 (12/06/2008)   Creatinine:  0.9 (12/06/2008)   Albumin:  4.1 (06/06/2008)   Total Protein:  6.2 (06/06/2008)   Calcium:  9.1 (12/06/2008)   Total Bili:  1.4 (06/06/2008)   SGPT (ALT):  20 (06/06/2008)   SGOT (AST):  18 (06/06/2008)   Current Medications (verified): 1)  Bayer Low Strength 81 Mg Tbec (Aspirin) .Marland Kitchen.. 1 Tab Daily 2)  Ultram 50 Mg Tabs (Tramadol Hcl) .Marland Kitchen.. 1 By Mouth Every 6 Hours As Needed For Moderate Pain 3)  Hydrea 500 Mg Caps (Hydroxyurea) .Marland Kitchen.. 1 Capsule By Mouth Every Other Day Alternating With 2 Capsule By Mouth Every Other Day 4)  Naprosyn 500 Mg Tabs (Naproxen) .Marland Kitchen.. 1 Tab Qam 5)  Toprol Xl 25 Mg Xr24h-Tab (Metoprolol Succinate) .Marland Kitchen.. 1 Tab By Mouth  Every Morning 6)  Lisinopril-Hydrochlorothiazide 20-12.5 Mg Tabs (Lisinopril-Hydrochlorothiazide) .Marland Kitchen.. 1 By Mouth Once Daily 7)  Lortab 7.5 7.5-500 Mg Tabs (Hydrocodone-Acetaminophen) .Marland Kitchen.. 1 Tab By Mouth Q 6 Hours As Needed 8)  Meclizine Hcl 12.5 Mg Tabs (Meclizine Hcl) .Marland Kitchen.. 1 -2 By Mouth Three Times A Day As Needed For Dizziness  Allergies (verified): No Known Drug Allergies  Past  History:  Past Medical History: 1. Low back pain 2. HTN 3. OSA - CPAP at bedtime 4. 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 Nuclear stress test in [facility] in 1/10.  Per patient, study was normal.  7. Chronic neck pain with history of c-spine surgery. 8. Obesity.  9. Lower extremity doppler US 2008: no evidence for PAD.   MD rooster: heme -[facility] -[facility]  Past Surgical History: Reviewed history from 10/30/2008 and no changes required. Left knee replacement- 10/2006 cervical Spine surg- 02/2008  Review of Systems  The patient denies anorexia, fever, chest pain, syncope, peripheral edema, and headaches.    Physical Exam  General:  overweight-appearing.  alert, well-developed,  well-nourished, and cooperative to examination.    Lungs:  normal respiratory effort, no intercostal retractions or use of accessory muscles; normal breath sounds bilaterally - no crackles and no wheezes.    Heart:  distant but normal rate, regular rhythm, no murmur, and no rub. BLE without edema.  Neurologic:  alert & oriented X3 and cranial nerves II-XII symetrically intact.  strength normal in all extremities, sensation intact to light touch, and gait normal. speech fluent without dysarthria or aphasia; follows commands with good comprehension.  Skin:  brusing on B forearms - large and small petichea - no skin tears or hematomas   Impression & Recommendations:  Problem # 1:  HYPERLIPIDEMIA-MIXED (ICD-272.4) diet mgmt ongoing -  check annually Labs Reviewed: SGOT: 18 (06/06/2008)   SGPT: 20 (06/06/2008)   HDL:40.50 (12/06/2008), 35 (06/06/2008)  LDL:113 (12/06/2008), 89 (16/12/9602)  Chol:171 (12/06/2008), 139 (06/06/2008)  Trig:89.0 (12/06/2008), 84 (06/06/2008)  Problem # 2:  HYPERTENSION (ICD-401.9) Assessment: Unchanged  His updated medication list for this problem includes:    Toprol Xl 25 Mg Xr24h-tab (Metoprolol succinate) .Marland Kitchen... 1 tab by mouth  every morning    Lisinopril-hydrochlorothiazide 20-12.5 Mg Tabs (Lisinopril-hydrochlorothiazide) .Marland Kitchen... 1 by mouth once daily  BP today: 120/72 Prior BP: 92/70 (01/30/2009)  Labs Reviewed: K+: 4.4 (12/06/2008) Creat: : 0.9 (12/06/2008)   Chol: 171 (12/06/2008)   HDL: 40.50 (12/06/2008)   LDL: 113 (12/06/2008)   TG: 89.0 (12/06/2008)  Problem # 3:  POLYCYTHEMIA (ICD-289.0) for f/u with heme today - labd and med mgmt per [facility] ?relation to new bruising/petichea  Problem # 4:  LOW BACK PAIN (ICD-724.2) will f/u with [facility] when medicare card "kicks in" cont med mgmt as ongoing until that time  His updated medication list for this problem includes:    Bayer Low Strength 81 Mg Tbec (Aspirin) .Marland Kitchen... 1 tab daily    Ultram 50 Mg Tabs  (Tramadol hcl) .Marland Kitchen... 1 by mouth every 6 hours as needed for moderate pain    Naprosyn 500 Mg Tabs (Naproxen) .Marland Kitchen... 1 tab qam    Lortab 7.5 7.5-500 Mg Tabs (Hydrocodone-acetaminophen) .Marland Kitchen... 1 tab by mouth q 6 hours as needed  Problem # 5:  OBSTRUCTIVE SLEEP APNEA (ICD-327.23) cont CPAP as ongoing  Complete Medication List: 1)  Bayer Low Strength 81 Mg Tbec (Aspirin) .Marland Kitchen.. 1 tab daily 2)  Ultram 50 Mg Tabs (Tramadol hcl) .Marland Kitchen.. 1 by mouth every 6 hours as needed for moderate pain 3)  Hydrea 500 Mg Caps (Hydroxyurea) .Marland Kitchen.. 1 capsule by mouth every other day alternating with 2 capsule by mouth every other day 4)  Naprosyn 500 Mg Tabs (Naproxen) .Marland Kitchen.. 1 tab qam 5)  Toprol Xl 25 Mg Xr24h-tab (Metoprolol succinate) .Marland Kitchen.. 1 tab by mouth  every morning 6)  Lisinopril-hydrochlorothiazide 20-12.5 Mg Tabs (Lisinopril-hydrochlorothiazide) .Marland Kitchen.. 1 by mouth once daily 7)  Lortab 7.5 7.5-500 Mg Tabs (Hydrocodone-acetaminophen) .Marland Kitchen.. 1 tab by mouth q 6 hours as needed 8)  Meclizine Hcl 12.5 Mg Tabs (Meclizine hcl) .Marland Kitchen.. 1 -2 by mouth three times a day as needed for dizziness  Patient Instructions: 1)  it was good to see you today. 2)  no medication changes recommmended today - keep doing what you are doing - 3)  3 month supply of meds written and given to you today as requested - 4)  Please schedule a follow-up appointment in 3-4 months, sooner if problems.  Prescriptions: MECLIZINE HCL 12.5 MG TABS (MECLIZINE HCL) 1 -2 by mouth three times a day as needed for dizziness  #120 x 0   Entered and Authorized by:   Newt Lukes MD   Signed by:   Newt Lukes MD on 04/30/2009   Method used:   Print then Give to Patient   RxID:   [id number] LORTAB 7.5 7.5-500 MG TABS (HYDROCODONE-ACETAMINOPHEN) 1 tab by mouth q 6 hours as needed  #90 x 0   Entered and Authorized by:   Newt Lukes MD   Signed by:   Newt Lukes MD on 04/30/2009   Method used:   Print then Give to Patient   RxID:    [id number] NAPROSYN 500 MG TABS (NAPROXEN) 1 tab qam  #90 x 0   Entered and Authorized by:   Newt Lukes MD   Signed by:   Newt Lukes MD on 04/30/2009   Method used:   Print then Give to Patient   RxID:   [id number] ULTRAM 50 MG TABS (TRAMADOL HCL) 1 by mouth every 6 hours as needed for moderate pain  #120 x 0   Entered and Authorized by:   Newt Lukes MD   Signed by:   Newt Lukes MD on 04/30/2009   Method used:   Print then Give to Patient   RxID:   [id number] LISINOPRIL-HYDROCHLOROTHIAZIDE 20-12.5 MG TABS (LISINOPRIL-HYDROCHLOROTHIAZIDE) 1 by mouth once daily  #90 x 3   Entered by:   Orlan Leavens   Authorized by:   Newt Lukes MD   Signed by:   Orlan Leavens on 04/30/2009   Method used:   Print then Give to Patient   RxID:   [id number] TOPROL XL 25 MG XR24H-TAB (METOPROLOL SUCCINATE) 1 tab by mouth  every morning  #90 x 3   Entered by:   Orlan Leavens   Authorized by:   Newt Lukes MD   Signed by:   Orlan Leavens on 04/30/2009   Method used:   Print then Give to Patient   RxID:   [id number]

## 2010-04-16 NOTE — Progress Notes (Signed)
Summary: Original Rx  Phone Note Call from Patient Call back at Home Phone [phone]   Caller: Patient Summary of Call: pt called stating that he found original Rx for Lortab written by MD 07/30/2009. I advised pt to bring Rx into office to be discarded but pt states that he has transportation problem. I then told pt to hold on to Rx and bring it in next OV for VAL. Pt agreed. Initial call taken by: Margaret Pyle, CMA,  Aug 05, 2009 3:48 PM  Follow-up for Phone Call        noted - thanks Follow-up by: Newt Lukes MD,  Aug 05, 2009 4:04 PM

## 2010-04-16 NOTE — Assessment & Plan Note (Signed)
Summary: ANNUAL WELLNESS/WILL COME FASTING/#/CD   Vital Signs:  Patient profile:   60 year old male Height:      65 inches (165.10 cm) Weight:      255.12 pounds (115.96 kg) O2 Sat:      97 % on Room air Temp:     97.0 degrees F (36.11 degrees C) oral Pulse rate:   70 / minute BP sitting:   128 / 78  (left arm) Cuff size:   large  Vitals Entered By: Orlan Leavens RMA (January 28, 2010 8:20 AM)  O2 Flow:  Room air CC: Yearly CPX Is Patient Diabetic? No Comments Req refill on pain meds. Did'nt do EKG pt states already had one done last month    Primary Care Provider:  Newt Lukes MD  CC:  Yearly CPX.  History of Present Illness: I have personally reviewed the Medicare Annual Wellness questionnaire and have noted 1.   The patient's medical and social history 2.   Their use of alcohol, tobacco or illicit drugs 3.   Their current medications and supplements 4.   The patient's functional ability including ADL's, fall risks, home safety risks and hearing or visual impairment. 5.   Diet and physical activities 6.   Evidence for depression or mood disorders  The patients weight, height, BMI and visual acuity have been recorded in the chart I have made referrals, counseling and provided education to the patient based review of the above and I have provided the pt with a written personalized care plan for preventive services.   also reviewed chronic med issues today: HTN -  reports compliance with ongoing medical treatment and no changes in medication dose or frequency. denies adverse side effects related to current therapy.  still feels occ dizzy and reports BP variable readings at home - uses meclizine for same- no syncope  low back pain, chronic - worse symptoms with notable weight gain pain better s/p ESI by dr. Venetia Maxon fall 2010 - but no f/u since due to loss of insurance med use of lortab and ultram reviewed - requests 3 mo supply to hold until next OV  OSA - still on CPAP  without change - sleeping reasonable well - no snoring or change in sleep -  PCV - taking hydroxyurea without change - no mucosal bleeding or rectal bleeding - no f/c or weight loss - labs done every 3 weeks (to tx) and OV q 6 mo  dyslipidemia - in setting of CAD/PVD cards would like tighter LDL - fasting for recheck today  c/o knee wound left patella s/p fall 4 weeks ago - healing but slow, very painful - requiring addl pain med use  c/o ED - no angina or CP - would like trial of viagra   Preventive Screening-Counseling & Management  Alcohol-Tobacco     Alcohol drinks/day: 0     Alcohol Counseling: not indicated; patient does not drink     Smoking Status: quit     Year Quit: 1974     Tobacco Counseling: to remain off tobacco products  Caffeine-Diet-Exercise     Does Patient Exercise: no     Exercise Counseling: to improve exercise regimen     Depression Counseling: not indicated; screening negative for depression  Safety-Violence-Falls     Seat Belt Counseling: not indicated; patient wears seat belts     Helmet Counseling: not applicable     Violence Counseling: not indicated; no violence risk noted     Fall  Risk Counseling: counseling provided; falls with injury noted  Clinical Review Panels:  Prevention   Last Colonoscopy:  Adenomatous Polyp (04/17/2004)  Immunizations   Last Tetanus Booster:  Historical (03/17/2007)   Last Flu Vaccine:  Fluvax 3+ (01/28/2010)  Lipid Management   Cholesterol:  179 (07/30/2009)   LDL (bad choesterol):  125 (07/30/2009)   HDL (good cholesterol):  38.80 (07/30/2009)   Triglycerides:  84 (06/06/2008)  CBC   WBC:  7.1 (07/30/2009)   RBC:  3.51 (07/30/2009)   Hgb:  14.3 (07/30/2009)   Hct:  41.5 (07/30/2009)   Platelets:  353.0 (07/30/2009)   MCV  118.3 (07/30/2009)   MCHC  34.3 (07/30/2009)   RDW  14.9 (07/30/2009)   PMN:  73.8 (07/30/2009)   Lymphs:  19.1 (07/30/2009)   Monos:  3.8 (07/30/2009)   Eosinophils:  3.0  (07/30/2009)   Basophil:  0.3 (07/30/2009)  Complete Metabolic Panel   Glucose:  94 (07/30/2009)   Sodium:  141 (07/30/2009)   Potassium:  5.2 (07/30/2009)   Chloride:  104 (07/30/2009)   CO2:  30 (07/30/2009)   BUN:  21 (07/30/2009)   Creatinine:  0.8 (07/30/2009)   Albumin:  4.1 (06/06/2008)   Total Protein:  6.2 (06/06/2008)   Calcium:  9.6 (07/30/2009)   Total Bili:  1.4 (06/06/2008)   SGPT (ALT):  20 (06/06/2008)   SGOT (AST):  18 (06/06/2008)   Current Medications (verified): 1)  Bayer Low Strength 81 Mg Tbec (Aspirin) .Marland Kitchen.. 1 Tab Daily 2)  Ultram 50 Mg Tabs (Tramadol Hcl) .Marland Kitchen.. 1 By Mouth Every 6 Hours As Needed For Moderate Pain 3)  Hydrea 500 Mg Caps (Hydroxyurea) .Marland Kitchen.. 1 Capsule By Mouth Every Other Day Alternating With 2 Capsule By Mouth Every Other Day 4)  Naprosyn 500 Mg Tabs (Naproxen) .Marland Kitchen.. 1 Tab Qam 5)  Toprol Xl 25 Mg Xr24h-Tab (Metoprolol Succinate) .Marland Kitchen.. 1 Tab By Mouth  Every Morning 6)  Lisinopril-Hydrochlorothiazide 20-12.5 Mg Tabs (Lisinopril-Hydrochlorothiazide) .Marland Kitchen.. 1 By Mouth Once Daily 7)  Lortab 7.5 7.5-500 Mg Tabs (Hydrocodone-Acetaminophen) .Marland Kitchen.. 1 Tab By Mouth Q 6 Hours As Needed 8)  Meclizine Hcl 12.5 Mg Tabs (Meclizine Hcl) .Marland Kitchen.. 1 -2 By Mouth Three Times A Day As Needed For Dizziness  Allergies (verified): No Known Drug Allergies  Past History:  Past medical, surgical, family and social histories (including risk factors) reviewed, and no changes noted (except as noted below).  Past Medical History: 1. Low back pain, chronic 2. HTN  3. OSA - CPAP at bedtime 4. 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 Stress myoview (7/08) in [place], [place]: small reversible inferior perfusion defect.  EF 65%.  Possible attenuation.  7. Chronic neck pain with history of c-spine surgery. 8. Obesity 9. Lower extremity doppler US 2008: no evidence for PAD.  10.  Echo (9/10): EF  55-60%, normal valves, mild LAE  MD roster: heme -odogwu  NSurg -stern cards- [facility]  Past Surgical History: Reviewed history from 10/30/2008 and no changes required. Left knee replacement- 10/2006 cervical Spine surg- 02/2008  Family History: Reviewed history from 11/26/2008 and no changes required. mom - died late 37s dad - died late 45s or early 13s due to massive MI  no cancer hx  Social History: Reviewed history from 11/26/2008 and no changes required. lives alone (divorced).  daughter lives in town. disabled  former management/construction quit cigars/chew - 1990 quit cigarettes 1970sSmoking Status:  quit Does Patient Exercise:  no  Review of Systems       see HPI above. I have reviewed all other systems and they were negative.   Physical Exam  General:  overweight-appearing.  alert, well-developed, well-nourished, and cooperative to examination.    Head:  Normocephalic and atraumatic without obvious abnormalities. No apparent alopecia or balding. Eyes:  vision grossly intact; pupils equal, round and reactive to light.  conjunctiva and lids normal.    Ears:  normal pinnae bilaterally, without erythema, swelling, or tenderness to palpation. TMs clear, without effusion, or cerumen impaction. Hearing grossly normal bilaterally  Mouth:  teeth and gums in good repair; mucous membranes moist, without lesions or ulcers. oropharynx clear without exudate, no erythema.  Neck:  thick, supple, full ROM, no masses, no thyromegaly; no thyroid nodules or tenderness. no JVD or carotid bruits.   Lungs:  normal respiratory effort, no intercostal retractions or use of accessory muscles; normal breath sounds bilaterally - no crackles and no wheezes.    Heart:  distant but normal rate, regular rhythm, no murmur, and no rub. BLE without edema.  Abdomen:  obeses but soft, non-tender, normal bowel sounds, no distention; no masses and no appreciable hepatomegaly or splenomegaly.   Msk:   chonic mild effusion L knee with scarring - not hot or erythematous. +crepitus but FROM. no gross deformities Neurologic:  alert & oriented X3 and cranial nerves II-XII symetrically intact.  strength normal in all extremities, sensation intact to light touch, and gait normal. speech fluent without dysarthria or aphasia; follows commands with good comprehension.  Skin:  2cm round ulceration, shallow base from subacute abrasion - no cellullitis or drainage -  Psych:  Oriented X3, memory intact for recent and remote, normally interactive, good eye contact, not anxious appearing, not depressed appearing, and not agitated.      Impression & Recommendations:  Problem # 1:  PREVENTIVE HEALTH CARE (ICD-V70.0) Patient has been counseled on age-appropriate routine health concerns for screening and prevention. These are reviewed and up-to-date. Immunizations are up-to-date or declined. Labs ordered and reviewed.   Problem # 2:  ERECTILE DYSFUNCTION, ORGANIC (ICD-607.84) ok for cialis trial - rx provided His updated medication list for this problem includes:    Cialis 5 Mg Tabs (Tadalafil) .Marland Kitchen... 1 by mouth once daily as needed 1 hour before sexual activity  Problem # 3:  HYPERLIPIDEMIA-MIXED (ICD-272.4)  Orders: TLB-Lipid Panel (80061-LIPID) per cards 12/2009 OV: LDL 125 07/2009.  Given family history of premature CAD, think it would be reasonable to further risk stratify him.   Labs Reviewed: SGOT: 18 (06/06/2008)   SGPT: 20 (06/06/2008)   HDL:38.80 (07/30/2009), 40.50 (12/06/2008)  LDL:125 (07/30/2009), 113 (12/06/2008)  Chol:179 (07/30/2009), 171 (12/06/2008)  Trig:76.0 (07/30/2009), 89.0 (12/06/2008)  Problem # 4:  CORONARY ARTERY DISEASE, VESSEL TYPE UNSPECIFIED (ICD-414.00)  His updated medication list for this problem includes:    Bayer Low Strength 81 Mg Tbec (Aspirin) .Marland Kitchen... 1 tab daily    Toprol Xl 25 Mg Xr24h-tab (Metoprolol succinate) .Marland Kitchen... 1 tab by mouth  every morning     Lisinopril-hydrochlorothiazide 20-12.5 Mg Tabs (Lisinopril-hydrochlorothiazide) .Marland Kitchen... 1 by mouth once daily  Labs Reviewed: Chol: 179 (07/30/2009)   HDL: 38.80 (07/30/2009)   LDL: 125 (07/30/2009)   TG: 76.0 (07/30/2009)  Problem # 5:  HYPERTENSION (ICD-401.9)  His updated medication list for this problem includes:    Toprol Xl 25 Mg Xr24h-tab (Metoprolol succinate) .Marland Kitchen... 1 tab by mouth  every morning    Lisinopril-hydrochlorothiazide 20-12.5 Mg Tabs (Lisinopril-hydrochlorothiazide) .Marland KitchenMarland KitchenMarland KitchenMarland Kitchen 1  by mouth once daily  BP today: 128/78 Prior BP: 118/72 (12/30/2009)  Labs Reviewed: K+: 5.2 (07/30/2009) Creat: : 0.8 (07/30/2009)   Chol: 179 (07/30/2009)   HDL: 38.80 (07/30/2009)   LDL: 125 (07/30/2009)   TG: 76.0 (07/30/2009)  Problem # 6:  ABRASION, KNEE, LEFT (ICD-916.0)  bactroban and gauze rec to cover until healed - noted addl use or lortab due to this pain  Orders: Prescription Created Electronically [phone])  Problem # 7:  DEGENERATIVE DISC DISEASE (ICD-722.6) pt requests letter to [facility] medical excusing him from required seatbelt use due to pressure it places on neck and subsquent pain - i will investigate this possiblity but no letter was given today - advised use of lapbelt without shoulder strap touching neck  Complete Medication List: 1)  Bayer Low Strength 81 Mg Tbec (Aspirin) .Marland Kitchen.. 1 tab daily 2)  Ultram 50 Mg Tabs (Tramadol hcl) .Marland Kitchen.. 1 by mouth every 6 hours as needed for moderate pain 3)  Hydrea 500 Mg Caps (Hydroxyurea) .Marland Kitchen.. 1 capsule by mouth every other day alternating with 2 capsule by mouth every other day 4)  Naprosyn 500 Mg Tabs (Naproxen) .Marland Kitchen.. 1 tab qam 5)  Toprol Xl 25 Mg Xr24h-tab (Metoprolol succinate) .Marland Kitchen.. 1 tab by mouth  every morning 6)  Lisinopril-hydrochlorothiazide 20-12.5 Mg Tabs (Lisinopril-hydrochlorothiazide) .Marland Kitchen.. 1 by mouth once daily 7)  Lortab 7.5 7.5-500 Mg Tabs (Hydrocodone-acetaminophen) .Marland Kitchen.. 1 tab by mouth q 6 hours as needed 8)  Meclizine Hcl 12.5  Mg Tabs (Meclizine hcl) .Marland Kitchen.. 1 -2 by mouth three times a day as needed for dizziness 9)  Cialis 5 Mg Tabs (Tadalafil) .Marland Kitchen.. 1 by mouth once daily as needed 1 hour before sexual activity 10)  Bactroban 2 % Oint (Mupirocin) .... Apply to affeted skin once daily and cover with gauze until healed  Other Orders: Flu Vaccine 70yrs + MEDICARE PATIENTS (F6213) Administration Flu vaccine - MCR (Y8657)  Patient Instructions: 1)  it was good to see you today. 2)  I have provided you with a copy of your personalized plan for preventive services. Please take the time to review along with your updated medication list.  3)  test(s) ordered today - your results will be posted on the phone tree for review in 48-72 hours from the time of test completion; call [phone] and enter your 9 digit MRN (listed above on this page, just below your name); if any changes need to be made or there are abnormal results, you will be contacted directly.  4)  trial cialis as discussed 5)  keep followup with Dr. Shirlee Latch as planned 6)  flu shot today 7)  refill on pain medications as discussed - 8)  ointment for your knee - keep covered with gauze until healed 9)  will look into letter for [facility] about shoulder strap and neck pain but should minimally wear your lapbelt for protection while driving 10)  it is important that you work on losing weight - monitor your diet and consume fewer calories such as less carbohydrates (sugar) and less fat. you also need to increase your physical activity level - start by walking for 10-20 minutes 3 times per week and work up to 30 minutes 4-5 times each week. 11)  Please schedule a follow-up appointment in 4-6 months, sooner if problems.  Prescriptions: BACTROBAN 2 % OINT (MUPIROCIN) apply to affeted skin once daily and cover with gauze until healed  #1 x 1   Entered and Authorized by:   Newt Lukes  MD   Signed by:   Newt Lukes MD on 01/28/2010   Method used:   Electronically to          [facility] (retail)       [address]       [place], [place]  [address]       Ph: [phone]       Fax: [phone]   RxID:   [id number] LORTAB 7.5 7.5-500 MG TABS (HYDROCODONE-ACETAMINOPHEN) 1 tab by mouth q 6 hours as needed  #90 x 1   Entered and Authorized by:   Newt Lukes MD   Signed by:   Newt Lukes MD on 01/28/2010   Method used:   Print then Give to Patient   RxID:   [id number] CIALIS 5 MG TABS (TADALAFIL) 1 by mouth once daily as needed 1 hour before sexual activity  #30 x 0   Entered and Authorized by:   Newt Lukes MD   Signed by:   Newt Lukes MD on 01/28/2010   Method used:   Print then Give to Patient   RxID:   [id number]    Orders Added: 1)  TLB-Lipid Panel [80061-LIPID] 2)  Flu Vaccine 47yrs + MEDICARE PATIENTS [Q2039] 3)  Administration Flu vaccine - MCR [G0008] 4)  Est. Patient 65& > [99397] 5)  Est. Patient Level III [29518] 6)  Prescription Created Electronically [A4166] Flu Vaccine Consent Questions     Do you have a history of severe allergic reactions to this vaccine? no    Any prior history of allergic reactions to egg and/or gelatin? no    Do you have a sensitivity to the preservative Thimersol? no    Do you have a past history of Guillan-Barre Syndrome? no    Do you currently have an acute febrile illness? no    Have you ever had a severe reaction to latex? no    Vaccine information given and explained to patient? yes    Are you currently pregnant? no    Lot Number:[id number]   Exp Date:09/12/2010   Site Given  Left Deltoid IMtration Flu vaccine - MCR [G0008]     .lbmedflu

## 2010-04-16 NOTE — Progress Notes (Signed)
  Phone Note Refill Request   Refills Requested: Medication #1:  HAND BRAKES AND A SEAT use as directed.  Method Requested: Fax to Mail Away Pharmacy Initial call taken by: Margaret Pyle, CMA,  March 25, 2010 4:27 PM    Prescriptions: ROLLATOR WALKER WITH 4 WHEELS, HAND BRAKES AND A SEAT use as directed  #1 x 0   Entered by:   Margaret Pyle, CMA   Authorized by:   Newt Lukes MD   Signed by:   Margaret Pyle, CMA on 03/25/2010   Method used:   Faxed to ...       [facility] (retail)       [address]       [place], [place]  [address]       Ph: [phone]       Fax: [phone]   RxID:   [id number]

## 2010-05-04 ENCOUNTER — Telehealth: Payer: Self-pay | Admitting: Internal Medicine

## 2010-05-06 ENCOUNTER — Other Ambulatory Visit: Payer: Self-pay | Admitting: Hematology and Oncology

## 2010-05-06 ENCOUNTER — Encounter: Payer: Self-pay | Admitting: Internal Medicine

## 2010-05-06 ENCOUNTER — Ambulatory Visit (INDEPENDENT_AMBULATORY_CARE_PROVIDER_SITE_OTHER): Payer: Medicare Other | Admitting: Internal Medicine

## 2010-05-06 ENCOUNTER — Encounter (HOSPITAL_BASED_OUTPATIENT_CLINIC_OR_DEPARTMENT_OTHER): Payer: Medicare Other | Admitting: Hematology and Oncology

## 2010-05-06 DIAGNOSIS — D473 Essential (hemorrhagic) thrombocythemia: Secondary | ICD-10-CM

## 2010-05-06 DIAGNOSIS — D47Z9 Other specified neoplasms of uncertain behavior of lymphoid, hematopoietic and related tissue: Secondary | ICD-10-CM

## 2010-05-06 DIAGNOSIS — IMO0002 Reserved for concepts with insufficient information to code with codable children: Secondary | ICD-10-CM

## 2010-05-06 DIAGNOSIS — D45 Polycythemia vera: Secondary | ICD-10-CM

## 2010-05-06 LAB — CBC WITH DIFFERENTIAL/PLATELET
Basophils Absolute: 0 10*3/uL (ref 0.0–0.1)
EOS%: 2.7 % (ref 0.0–7.0)
Eosinophils Absolute: 0.2 10*3/uL (ref 0.0–0.5)
HGB: 16.3 g/dL (ref 13.0–17.1)
NEUT#: 6 10*3/uL (ref 1.5–6.5)
RBC: 4.04 10*6/uL — ABNORMAL LOW (ref 4.20–5.82)
RDW: 14.9 % — ABNORMAL HIGH (ref 11.0–14.6)
lymph#: 1.2 10*3/uL (ref 0.9–3.3)

## 2010-05-06 LAB — BASIC METABOLIC PANEL
CO2: 23 mEq/L (ref 19–32)
Calcium: 10 mg/dL (ref 8.4–10.5)
Chloride: 98 mEq/L (ref 96–112)
Creatinine, Ser: 0.94 mg/dL (ref 0.40–1.50)
Sodium: 134 mEq/L — ABNORMAL LOW (ref 135–145)

## 2010-05-11 ENCOUNTER — Encounter (HOSPITAL_BASED_OUTPATIENT_CLINIC_OR_DEPARTMENT_OTHER): Payer: Medicare Other

## 2010-05-12 NOTE — Progress Notes (Signed)
Summary: Wound?  Phone Note Call from Patient   Caller: Patient [phone] Summary of Call: Pt called stating the wound on his leg had started draining after he re-injured it. Pt has been using cream Rxd but was told by his neighbor who is a retired Engineer, civil (consulting) that he should contact physician regarding thus. Initial call taken by: Margaret Pyle, CMA,  May 04, 2010 2:04 PM  Follow-up for Phone Call        OV please to eval and tx same - thx Follow-up by: Newt Lukes MD,  May 04, 2010 2:26 PM  Additional Follow-up for Phone Call Additional follow up Details #1::        Pt informed and transferred to scheduled appt. Additional Follow-up by: Margaret Pyle, CMA,  May 04, 2010 2:36 PM

## 2010-05-12 NOTE — Assessment & Plan Note (Signed)
Summary: KNEE INJURY /NWS   Vital Signs:  Patient profile:   60 year old male Weight:      247.12 pounds (112.33 kg) O2 Sat:      98 % on Room air Temp:     98.5 degrees F (36.94 degrees C) oral Pulse rate:   65 / minute BP sitting:   130 / 80  (left arm) Cuff size:   large  Vitals Entered By: Orlan Leavens RMA (May 06, 2010 8:41 AM)  O2 Flow:  Room air CC: Knee injury Is Patient Diabetic? No Pain Assessment Patient in pain? yes     Location: both knees Type: aching Onset of pain  Pt states he fell at hurt both knees   Primary Care Provider:  Newt Lukes MD  CC:  Knee injury.  History of Present Illness:  c/o knee wound located left patella  trauma s/p initial fall 01/2010 - was healing but slowly, very painful requiring addl pain meds now s/p repeat fall, injury and new abrasion to same knee 4 days ago wound with open draining and increasing redness around wound over knee cap no odor, no fever  also reviewed chronic med issues today: HTN -  reports compliance with ongoing medical treatment and no changes in medication dose or frequency. denies adverse side effects related to current therapy.  still feels occ dizzy and reports BP variable readings at home - uses meclizine for same- no syncope  low back pain, chronic - worse symptoms with notable weight gain pain better s/p ESI by dr. Venetia Maxon fall 2010 - but no f/u since due to loss of insurance med use of lortab and ultram reviewed - requests 3 mo supply to hold until next OV  OSA - still on CPAP without change - sleeping reasonable well - no snoring or change in sleep -  PCV - taking hydroxyurea without change - no mucosal bleeding or rectal bleeding - no f/c or weight loss - labs done every 3 weeks (to tx) and OV q 6 mo  dyslipidemia - in setting of CAD/PVD cards would like tighter LDL - fasting for recheck today  c/o ED - no angina or CP - would like trial of viagra   Current Medications  (verified): 1)  Bayer Low Strength 81 Mg Tbec (Aspirin) .Marland Kitchen.. 1 Tab Daily 2)  Ultram 50 Mg Tabs (Tramadol Hcl) .Marland Kitchen.. 1 By Mouth Every 6 Hours As Needed For Moderate Pain 3)  Hydrea 500 Mg Caps (Hydroxyurea) .Marland Kitchen.. 1 Capsule By Mouth Every Other Day Alternating With 2 Capsule By Mouth Every Other Day 4)  Naprosyn 500 Mg Tabs (Naproxen) .Marland Kitchen.. 1 Tab Qam 5)  Toprol Xl 25 Mg Xr24h-Tab (Metoprolol Succinate) .Marland Kitchen.. 1 Tab By Mouth  Every Morning 6)  Lisinopril-Hydrochlorothiazide 20-12.5 Mg Tabs (Lisinopril-Hydrochlorothiazide) .Marland Kitchen.. 1 By Mouth Once Daily 7)  Lortab 7.5 7.5-500 Mg Tabs (Hydrocodone-Acetaminophen) .Marland Kitchen.. 1 Tab By Mouth Q 6 Hours As Needed 8)  Meclizine Hcl 12.5 Mg Tabs (Meclizine Hcl) .Marland Kitchen.. 1 -2 By Mouth Three Times A Day As Needed For Dizziness 9)  Cialis 5 Mg Tabs (Tadalafil) .Marland Kitchen.. 1 By Mouth Once Daily As Needed 1 Hour Before Sexual Activity 10)  Bactroban 2 % Oint (Mupirocin) .... Apply To Affeted Skin Once Daily and Cover With Gauze Until Healed 11)  Rollator Walker With 4 Wheels, Hand Brakes and A Seat .... Use As Directed  Allergies (verified): No Known Drug Allergies  Past History:  Past Medical History: 1. Low back pain,  chronic  2. HTN  3. OSA - CPAP at bedtime 4. 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 Stress myoview (7/08) in [place], [place]: small reversible inferior perfusion defect.  EF 65%.  Possible attenuation.  7. Chronic neck pain with history of c-spine surgery. 8. Obesity 9. Lower extremity doppler US 2008: no evidence for PAD.  10.  Echo (9/10): EF 55-60%, normal valves, mild LAE  MD roster: heme -odogwu  NSurg -stern cards- mclean  Review of Systems       The patient complains of suspicious skin lesions and difficulty walking.  The patient denies fever and weight loss.    Physical Exam  General:  overweight-appearing.  alert, well-developed, well-nourished, and  cooperative to examination.    Msk:  chonic mild effusion L knee with scarring - not hot or erythematous. +crepitus but FROM. no gross deformities Skin:  4cmx4.5cm round ulceration from abrasion over left patella, prev (01/29/11) 2cm round ulceration, shallow base from subacute abrasion - now with surrounding cellullitis and scant yellow drainage - no odor -- mild scabbed abrasions to right patella region, no cellulitis Psych:  Oriented X3, memory intact for recent and remote, normally interactive, good eye contact, not anxious appearing, not depressed appearing, and not agitated.      Impression & Recommendations:  Problem # 1:  ABRASION, KNEE, LEFT (ICD-916.0)  injury 01/2010, now reinjured 04/2010 with repeated falls - reviewed wound care - wash and cover with bactroban and gauze 2-3x/d add septra due to new cellulitis and refer to wound clinic given progressive inc in wound size cont same pain pills - increase dose per mo given 01/2010 for same - no further increase now  Orders: Prescription Created Electronically [phone]) Wound Care Center Referral (Wound Care)  Complete Medication List: 1)  Bayer Low Strength 81 Mg Tbec (Aspirin) .Marland Kitchen.. 1 tab daily 2)  Ultram 50 Mg Tabs (Tramadol hcl) .Marland Kitchen.. 1 by mouth every 6 hours as needed for moderate pain 3)  Hydrea 500 Mg Caps (Hydroxyurea) .Marland Kitchen.. 1 capsule by mouth every other day alternating with 2 capsule by mouth every other day 4)  Naprosyn 500 Mg Tabs (Naproxen) .Marland Kitchen.. 1 tab qam 5)  Toprol Xl 25 Mg Xr24h-tab (Metoprolol succinate) .Marland Kitchen.. 1 tab by mouth  every morning 6)  Lisinopril-hydrochlorothiazide 20-12.5 Mg Tabs (Lisinopril-hydrochlorothiazide) .Marland Kitchen.. 1 by mouth once daily 7)  Lortab 7.5 7.5-500 Mg Tabs (Hydrocodone-acetaminophen) .Marland Kitchen.. 1 tab by mouth q 6 hours as needed 8)  Meclizine Hcl 12.5 Mg Tabs (Meclizine hcl) .Marland Kitchen.. 1 -2 by mouth three times a day as needed for dizziness 9)  Cialis 5 Mg Tabs (Tadalafil) .Marland Kitchen.. 1 by mouth once daily as needed 1  hour before sexual activity 10)  Bactroban 2 % Oint (Mupirocin) .... Apply to affeted skin once daily and cover with gauze until healed 11)  Rollator Walker With 4 Wheels, Dance movement psychotherapist and A Seat  .... Use as directed 12)  Septra Ds 800-160 Mg Tabs (Sulfamethoxazole-trimethoprim) .Marland Kitchen.. 1 by mouth two times a day x 10days  Patient Instructions: 1)  it was good to see you today 2)  wash and clean wound 2-3x/day with warm soapy water, then cover with bactroban ointment and gauze - 3)  use septa antibiotics two times a day for infection - your prescription has been electronically submitted to your pharmacy. Please take as directed. Contact our office if you believe you're having problems with the medication(s).  4)  we'll make referral to wound care clinic for furether evaluation and treatment of wound. Our office will contact you regarding this appointment once made.  5)  Please keep scheduled follow-up appointment as planned to review, call sooner if problems.  Prescriptions: SEPTRA DS 800-160 MG TABS (SULFAMETHOXAZOLE-TRIMETHOPRIM) 1 by mouth two times a day x 10days  #20 x 0   Entered and Authorized by:   Newt Lukes MD   Signed by:   Newt Lukes MD on 05/06/2010   Method used:   Electronically to        [facility] (retail)       [address]       [place], [place]  [address]       Ph: [phone]       Fax: [phone]   RxID:   [id number]    Orders Added: 1)  Est. Patient Level IV [401[address]] 2)  Prescription Created Electronically [phone] 3)  Wound Care Center Referral [Wound Care]

## 2010-05-18 ENCOUNTER — Encounter (HOSPITAL_BASED_OUTPATIENT_CLINIC_OR_DEPARTMENT_OTHER): Payer: Medicare Other | Admitting: Hematology and Oncology

## 2010-05-18 ENCOUNTER — Encounter (HOSPITAL_BASED_OUTPATIENT_CLINIC_OR_DEPARTMENT_OTHER): Payer: Medicare Other | Attending: Internal Medicine

## 2010-05-18 DIAGNOSIS — Z79899 Other long term (current) drug therapy: Secondary | ICD-10-CM | POA: Insufficient documentation

## 2010-05-18 DIAGNOSIS — D45 Polycythemia vera: Secondary | ICD-10-CM

## 2010-05-18 DIAGNOSIS — L97809 Non-pressure chronic ulcer of other part of unspecified lower leg with unspecified severity: Secondary | ICD-10-CM | POA: Insufficient documentation

## 2010-05-18 DIAGNOSIS — Z9181 History of falling: Secondary | ICD-10-CM | POA: Insufficient documentation

## 2010-05-18 DIAGNOSIS — E669 Obesity, unspecified: Secondary | ICD-10-CM | POA: Insufficient documentation

## 2010-05-18 DIAGNOSIS — M4800 Spinal stenosis, site unspecified: Secondary | ICD-10-CM | POA: Insufficient documentation

## 2010-05-18 DIAGNOSIS — I10 Essential (primary) hypertension: Secondary | ICD-10-CM | POA: Insufficient documentation

## 2010-05-18 DIAGNOSIS — Z7982 Long term (current) use of aspirin: Secondary | ICD-10-CM | POA: Insufficient documentation

## 2010-05-26 ENCOUNTER — Encounter: Payer: Self-pay | Admitting: Internal Medicine

## 2010-05-26 NOTE — Letter (Signed)
Summary: [facility]  [facility]   Imported By: Lennie Odor 05/20/2010 11:19:40  _____________________________________________________________________  External Attachment:    Type:   Image     Comment:   External Document

## 2010-05-28 ENCOUNTER — Ambulatory Visit: Payer: Self-pay | Admitting: Internal Medicine

## 2010-06-02 ENCOUNTER — Encounter: Payer: Self-pay | Admitting: Internal Medicine

## 2010-06-03 ENCOUNTER — Encounter: Payer: Self-pay | Admitting: Internal Medicine

## 2010-06-03 ENCOUNTER — Ambulatory Visit (INDEPENDENT_AMBULATORY_CARE_PROVIDER_SITE_OTHER): Payer: Medicare Other | Admitting: Internal Medicine

## 2010-06-03 DIAGNOSIS — N529 Male erectile dysfunction, unspecified: Secondary | ICD-10-CM

## 2010-06-03 DIAGNOSIS — S81009A Unspecified open wound, unspecified knee, initial encounter: Secondary | ICD-10-CM

## 2010-06-03 DIAGNOSIS — D751 Secondary polycythemia: Secondary | ICD-10-CM

## 2010-06-03 DIAGNOSIS — E785 Hyperlipidemia, unspecified: Secondary | ICD-10-CM

## 2010-06-03 DIAGNOSIS — S81809A Unspecified open wound, unspecified lower leg, initial encounter: Secondary | ICD-10-CM

## 2010-06-03 DIAGNOSIS — I10 Essential (primary) hypertension: Secondary | ICD-10-CM

## 2010-06-03 DIAGNOSIS — G4733 Obstructive sleep apnea (adult) (pediatric): Secondary | ICD-10-CM

## 2010-06-03 DIAGNOSIS — M545 Low back pain: Secondary | ICD-10-CM

## 2010-06-03 MED ORDER — HYDROCODONE-ACETAMINOPHEN 7.5-500 MG PO TABS: 1.0000 | ORAL_TABLET | Freq: Four times a day (QID) | ORAL | Status: DC | PRN

## 2010-06-03 MED ORDER — METOPROLOL SUCCINATE ER 25 MG PO TB24: 25.0000 mg | ORAL_TABLET | ORAL | Status: DC

## 2010-06-03 MED ORDER — TADALAFIL 5 MG PO TABS: 5.0000 mg | ORAL_TABLET | Freq: Every day | ORAL | Status: DC | PRN

## 2010-06-03 MED ORDER — LISINOPRIL-HYDROCHLOROTHIAZIDE 20-12.5 MG PO TABS: 1.0000 | ORAL_TABLET | Freq: Every day | ORAL | Status: DC

## 2010-06-03 MED ORDER — MECLIZINE HCL 12.5 MG PO TABS: 12.5000 mg | ORAL_TABLET | ORAL | Status: DC | PRN

## 2010-06-03 MED ORDER — TRAMADOL HCL 50 MG PO TABS: 50.0000 mg | ORAL_TABLET | Freq: Four times a day (QID) | ORAL | Status: DC | PRN

## 2010-06-03 MED ORDER — MUPIROCIN 2 % EX OINT: TOPICAL_OINTMENT | Freq: Three times a day (TID) | CUTANEOUS | Status: DC

## 2010-06-03 MED ORDER — COLLAGENASE 250 UNIT/GM EX OINT: TOPICAL_OINTMENT | CUTANEOUS | Status: DC

## 2010-06-03 NOTE — Assessment & Plan Note (Signed)
meds and labs reviewed - at goal - continue same Filed Vitals:   06/03/10 0900  BP: 130/80  Pulse: 65  Temp: 98.8 F (37.1 C)

## 2010-06-03 NOTE — Assessment & Plan Note (Signed)
Chronic narcotic use to control same symptoms C subst registry reviewed - cont same - refills done

## 2010-06-03 NOTE — Assessment & Plan Note (Signed)
Last lipids reviewed - acceptable per cards review 01/2010 Cont diet/lifestyle control of same Lab Results  Component Value Date   LDLCALC 94 01/28/2010

## 2010-06-03 NOTE — Assessment & Plan Note (Signed)
Cont CPAP as ongoing, weight loss encouragde

## 2010-06-03 NOTE — Patient Instructions (Signed)
Good to see you today.  Refill on medication(s) as discussed today.

## 2010-06-03 NOTE — Assessment & Plan Note (Signed)
Follows with heme for same - blood donation every 4-6 weeks as needed, labs q2w and OV q42mo - cont hydroxyurea Lab Results  Component Value Date   WBC 7.1 07/30/2009   HGB 16.3 05/06/2010   HCT 48.3 05/06/2010   MCV 119.6* 05/06/2010   PLT 353 05/06/2010

## 2010-06-03 NOTE — Progress Notes (Signed)
Subjective:    Patient ID: Fernando Lane, male    DOB: 09-30-1950, 60 y.o.   MRN: [id number]  HPI Here for followup   knee wound Located over left patella  trauma s/p initial fall 01/2010 - was healing but slowly, very painful requiring addl pain meds then s/p repeat fall, injury and new abrasion to same knee 04/2010. wound without open draining and increasing redness around wound over knee cap no odor, no fever - seen by wound care clinic 05/26/10  HTN -  reports compliance with ongoing medical treatment and no changes in medication dose or frequency. denies adverse side effects related to current therapy.  still feels occ dizzy and reports BP variable readings at home - uses meclizine for same- no syncope  low back pain, chronic - worse symptoms with notable weight gain pain better s/p ESI by dr. Venetia Maxon fall 2010 - but no f/u since due to loss of insurance med use of lortab and ultram reviewed - requests supply to hold until next OV  OSA - still on CPAP without change - sleeping reasonable well - no snoring or change in sleep -  PCV - taking hydroxyurea without change - no mucosal bleeding or rectal bleeding - no f/c or weight loss - labs done every 3 weeks (to tx) and OV q 6 mo  dyslipidemia - not on statin - watching closely with cards due to hx of CAD/PVD cards -?tighter LDL -  Lab Results  Component Value Date   LDLCALC 94 01/28/2010     c/o ED - no angina or CP - would like trial of viagra   Past Medical History  Diagnosis Date  . Hypertension   . Chronic low back pain   . OSA (obstructive sleep apnea)     CPAP @ bedtime  . 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 Chronic neck pain    History   Social History  . Marital Status: Single    Spouse Name: N/A    Number of Children: N/A  . Years of Education: N/A   Occupational History  . Not on file.   Social History Main Topics  . Smoking status: Former Smoker      Types: Cigarettes, Pipe    Quit date: 03/15/1988  . Smokeless tobacco: Not on file   Comment: Pt quit Cigarettes 1970's- and cigars 1990  . Alcohol Use: No  . Drug Use: No  . Sexually Active: Not on file   Other Topics Concern  . Not on file   Social History Narrative   Divorced, lives alone. Daughter lives in town. Disable/Former management/construction   No family history on file.  Review of Systems Patient denies chest pain, edema or dyspnea on exertion. No headache, rash or abdominal pain. No specific complaints in a complete review of systems (except as listed above).     Objective:   Physical Exam  Constitutional: He is oriented to person, place, and time. He appears well-developed and well-nourished. No distress.  HENT:  Head: Normocephalic and atraumatic.  Nose: Nose normal.  Mouth/Throat: Oropharynx is clear and moist.       Hearing grossly normal.  Eyes: Conjunctivae and EOM are normal. Pupils are equal, round, and reactive to light. No scleral icterus.  Neck: Normal range of motion. Neck supple. No JVD present. No thyromegaly present.  Cardiovascular: Normal rate, regular rhythm, normal heart sounds and intact distal pulses.  Exam reveals no friction rub.  No murmur heard.      No edema.  Pulmonary/Chest: Effort normal and breath sounds normal. No respiratory distress. He has no wheezes.  Abdominal: Soft. Bowel sounds are normal. He exhibits no distension and no mass. There is no tenderness. There is no guarding.  Genitourinary:       defer  Musculoskeletal: Normal range of motion. He exhibits no edema and no tenderness.  Lymphadenopathy:    He has no cervical adenopathy.  Neurological: He is alert and oriented to person, place, and time. He has normal reflexes. No cranial nerve deficit.  Skin: Skin is warm and dry. No rash noted. No erythema.  Psychiatric: He has a normal mood and affect. His behavior is normal. Thought content normal.           Assessment & Plan:  See problem list; last labs reviewed. Time spent with patient today 35 minutes, greater than 50% time spent counseling patient on chronic back/neck pain, wound care and medication review. Also review of prior records

## 2010-06-08 ENCOUNTER — Ambulatory Visit (HOSPITAL_BASED_OUTPATIENT_CLINIC_OR_DEPARTMENT_OTHER): Payer: Medicare Other

## 2010-06-08 ENCOUNTER — Encounter (HOSPITAL_BASED_OUTPATIENT_CLINIC_OR_DEPARTMENT_OTHER): Payer: Medicare Other | Attending: Internal Medicine

## 2010-06-08 ENCOUNTER — Encounter (HOSPITAL_BASED_OUTPATIENT_CLINIC_OR_DEPARTMENT_OTHER): Payer: Medicare Other

## 2010-06-08 DIAGNOSIS — Z7982 Long term (current) use of aspirin: Secondary | ICD-10-CM | POA: Insufficient documentation

## 2010-06-08 DIAGNOSIS — E669 Obesity, unspecified: Secondary | ICD-10-CM | POA: Insufficient documentation

## 2010-06-08 DIAGNOSIS — W19XXXA Unspecified fall, initial encounter: Secondary | ICD-10-CM | POA: Insufficient documentation

## 2010-06-08 DIAGNOSIS — M4800 Spinal stenosis, site unspecified: Secondary | ICD-10-CM | POA: Insufficient documentation

## 2010-06-08 DIAGNOSIS — Y929 Unspecified place or not applicable: Secondary | ICD-10-CM | POA: Insufficient documentation

## 2010-06-08 DIAGNOSIS — Z79899 Other long term (current) drug therapy: Secondary | ICD-10-CM | POA: Insufficient documentation

## 2010-06-08 DIAGNOSIS — S81009A Unspecified open wound, unspecified knee, initial encounter: Secondary | ICD-10-CM | POA: Insufficient documentation

## 2010-06-08 DIAGNOSIS — S91009A Unspecified open wound, unspecified ankle, initial encounter: Secondary | ICD-10-CM | POA: Insufficient documentation

## 2010-06-08 DIAGNOSIS — D45 Polycythemia vera: Secondary | ICD-10-CM | POA: Insufficient documentation

## 2010-06-08 DIAGNOSIS — Z96659 Presence of unspecified artificial knee joint: Secondary | ICD-10-CM | POA: Insufficient documentation

## 2010-06-09 ENCOUNTER — Telehealth: Payer: Self-pay | Admitting: Internal Medicine

## 2010-06-09 NOTE — Telephone Encounter (Signed)
Pt advised that per VAL, MD is not comfortable stating that pt does not need to wear a seat belt due to "medical reasons. Pt understood

## 2010-06-11 NOTE — Letter (Signed)
Summary: Wound Care/[place]  [facility]/[place]   Imported By: Lester Tuttle 06/01/2010 08:11:19  _____________________________________________________________________  External Attachment:    Type:   Image     Comment:   External Document

## 2010-06-22 ENCOUNTER — Encounter (HOSPITAL_BASED_OUTPATIENT_CLINIC_OR_DEPARTMENT_OTHER): Payer: Medicare Other | Attending: Internal Medicine

## 2010-06-22 DIAGNOSIS — I10 Essential (primary) hypertension: Secondary | ICD-10-CM | POA: Insufficient documentation

## 2010-06-22 DIAGNOSIS — D45 Polycythemia vera: Secondary | ICD-10-CM | POA: Insufficient documentation

## 2010-06-22 DIAGNOSIS — M4800 Spinal stenosis, site unspecified: Secondary | ICD-10-CM | POA: Insufficient documentation

## 2010-06-22 DIAGNOSIS — E669 Obesity, unspecified: Secondary | ICD-10-CM | POA: Insufficient documentation

## 2010-06-22 DIAGNOSIS — L97809 Non-pressure chronic ulcer of other part of unspecified lower leg with unspecified severity: Secondary | ICD-10-CM | POA: Insufficient documentation

## 2010-06-22 DIAGNOSIS — Z9181 History of falling: Secondary | ICD-10-CM | POA: Insufficient documentation

## 2010-06-22 DIAGNOSIS — Z7982 Long term (current) use of aspirin: Secondary | ICD-10-CM | POA: Insufficient documentation

## 2010-06-22 DIAGNOSIS — Z79899 Other long term (current) drug therapy: Secondary | ICD-10-CM | POA: Insufficient documentation

## 2010-07-06 ENCOUNTER — Ambulatory Visit (HOSPITAL_BASED_OUTPATIENT_CLINIC_OR_DEPARTMENT_OTHER): Payer: Medicare Other

## 2010-07-20 ENCOUNTER — Other Ambulatory Visit: Payer: Self-pay | Admitting: *Deleted

## 2010-07-20 DIAGNOSIS — M545 Low back pain: Secondary | ICD-10-CM

## 2010-07-20 MED ORDER — HYDROCODONE-ACETAMINOPHEN 7.5-500 MG PO TABS: 1.0000 | ORAL_TABLET | Freq: Four times a day (QID) | ORAL | Status: DC | PRN

## 2010-07-20 NOTE — Telephone Encounter (Signed)
Faxed script back to [facility]...07/20/10@4 :33pm..07/20/10@4 :34pm/LMB

## 2010-07-27 ENCOUNTER — Encounter (HOSPITAL_BASED_OUTPATIENT_CLINIC_OR_DEPARTMENT_OTHER): Payer: Medicare Other | Attending: Internal Medicine

## 2010-07-27 ENCOUNTER — Encounter (HOSPITAL_BASED_OUTPATIENT_CLINIC_OR_DEPARTMENT_OTHER): Payer: Medicare Other | Admitting: Hematology and Oncology

## 2010-07-27 ENCOUNTER — Other Ambulatory Visit: Payer: Self-pay | Admitting: Hematology and Oncology

## 2010-07-27 DIAGNOSIS — L97809 Non-pressure chronic ulcer of other part of unspecified lower leg with unspecified severity: Secondary | ICD-10-CM | POA: Insufficient documentation

## 2010-07-27 DIAGNOSIS — I10 Essential (primary) hypertension: Secondary | ICD-10-CM | POA: Insufficient documentation

## 2010-07-27 DIAGNOSIS — D473 Essential (hemorrhagic) thrombocythemia: Secondary | ICD-10-CM

## 2010-07-27 DIAGNOSIS — Z96659 Presence of unspecified artificial knee joint: Secondary | ICD-10-CM | POA: Insufficient documentation

## 2010-07-27 DIAGNOSIS — D47Z9 Other specified neoplasms of uncertain behavior of lymphoid, hematopoietic and related tissue: Secondary | ICD-10-CM

## 2010-07-27 DIAGNOSIS — Z79899 Other long term (current) drug therapy: Secondary | ICD-10-CM | POA: Insufficient documentation

## 2010-07-27 DIAGNOSIS — D45 Polycythemia vera: Secondary | ICD-10-CM

## 2010-07-27 LAB — CBC WITH DIFFERENTIAL/PLATELET
BASO%: 0.3 % (ref 0.0–2.0)
LYMPH%: 17.5 % (ref 14.0–49.0)
MCHC: 34.7 g/dL (ref 32.0–36.0)
MCV: 115.1 fL — ABNORMAL HIGH (ref 79.3–98.0)
MONO%: 4.1 % (ref 0.0–14.0)
Platelets: 300 10*3/uL (ref 140–400)
RBC: 3.91 10*6/uL — ABNORMAL LOW (ref 4.20–5.82)
RDW: 15 % — ABNORMAL HIGH (ref 11.0–14.6)
WBC: 6.6 10*3/uL (ref 4.0–10.3)

## 2010-08-17 ENCOUNTER — Encounter (HOSPITAL_BASED_OUTPATIENT_CLINIC_OR_DEPARTMENT_OTHER): Payer: Medicare Other | Attending: Internal Medicine

## 2010-08-17 DIAGNOSIS — L97809 Non-pressure chronic ulcer of other part of unspecified lower leg with unspecified severity: Secondary | ICD-10-CM | POA: Insufficient documentation

## 2010-08-17 DIAGNOSIS — I10 Essential (primary) hypertension: Secondary | ICD-10-CM | POA: Insufficient documentation

## 2010-08-17 DIAGNOSIS — Z79899 Other long term (current) drug therapy: Secondary | ICD-10-CM | POA: Insufficient documentation

## 2010-08-17 DIAGNOSIS — Z96659 Presence of unspecified artificial knee joint: Secondary | ICD-10-CM | POA: Insufficient documentation

## 2010-08-30 ENCOUNTER — Other Ambulatory Visit: Payer: Self-pay | Admitting: Internal Medicine

## 2010-09-04 ENCOUNTER — Other Ambulatory Visit: Payer: Self-pay | Admitting: Internal Medicine

## 2010-09-21 ENCOUNTER — Encounter (HOSPITAL_BASED_OUTPATIENT_CLINIC_OR_DEPARTMENT_OTHER): Payer: Medicare Other | Attending: Internal Medicine

## 2010-09-21 DIAGNOSIS — L97809 Non-pressure chronic ulcer of other part of unspecified lower leg with unspecified severity: Secondary | ICD-10-CM | POA: Insufficient documentation

## 2010-09-30 ENCOUNTER — Ambulatory Visit: Payer: Medicare Other | Admitting: Internal Medicine

## 2010-10-05 ENCOUNTER — Encounter: Payer: Self-pay | Admitting: Internal Medicine

## 2010-10-05 ENCOUNTER — Ambulatory Visit (INDEPENDENT_AMBULATORY_CARE_PROVIDER_SITE_OTHER): Payer: Medicare Other | Admitting: Internal Medicine

## 2010-10-05 DIAGNOSIS — N529 Male erectile dysfunction, unspecified: Secondary | ICD-10-CM

## 2010-10-05 DIAGNOSIS — IMO0002 Reserved for concepts with insufficient information to code with codable children: Secondary | ICD-10-CM

## 2010-10-05 DIAGNOSIS — M25562 Pain in left knee: Secondary | ICD-10-CM

## 2010-10-05 DIAGNOSIS — I10 Essential (primary) hypertension: Secondary | ICD-10-CM

## 2010-10-05 DIAGNOSIS — M25569 Pain in unspecified knee: Secondary | ICD-10-CM

## 2010-10-05 DIAGNOSIS — D751 Secondary polycythemia: Secondary | ICD-10-CM

## 2010-10-05 DIAGNOSIS — E785 Hyperlipidemia, unspecified: Secondary | ICD-10-CM

## 2010-10-05 MED ORDER — MUPIROCIN 2 % EX OINT: TOPICAL_OINTMENT | Freq: Two times a day (BID) | CUTANEOUS | Status: DC

## 2010-10-05 MED ORDER — TADALAFIL 5 MG PO TABS: 5.0000 mg | ORAL_TABLET | Freq: Every day | ORAL | Status: DC | PRN

## 2010-10-05 NOTE — Assessment & Plan Note (Signed)
Last lipids reviewed - acceptable per cards review 01/2010 Resume work on diet/lifestyle control of same Lab Results  Component Value Date   LDLCALC 94 01/28/2010

## 2010-10-05 NOTE — Patient Instructions (Signed)
It was good to see you today. Medications reviewed, no changes at this time. Work on lifestyle changes as discussed (low fat, low carb, increased protein diet; improved exercise efforts; weight loss) to control sugar, blood pressure and cholesterol levels and/or reduce risk of developing other medical problems. Look into [email or web address] or other type of food journal to assist you in this process. we'll make referral to physical therapy for your knee and possible brace. Our office will contact you regarding appointment(s) once made. Please schedule followup in 4-6 months, call sooner if problems.

## 2010-10-05 NOTE — Assessment & Plan Note (Signed)
Follows with heme for same - blood donation every 4-6 weeks as needed, labs q12w and OV q61mo - cont hydroxyurea  Lab Results  Component Value Date   WBC 7.1 07/30/2009   HGB 15.6 07/27/2010   HCT 45.0 07/27/2010   MCV 115.1* 07/27/2010   PLT 300 07/27/2010

## 2010-10-05 NOTE — Assessment & Plan Note (Signed)
Initial injury 01/2010, then re-injury 04/2010 Following with wound care center since 05/2010 - slow progress due to hydroxyurea use (per pt) Will refer to PT for asst with left knee stiffness, ?brace

## 2010-10-05 NOTE — Progress Notes (Signed)
Subjective:    Patient ID: Fernando Lane, male    DOB: June 20, 1950, 60 y.o.   MRN: [id number]  HPI  Here for follow up - reviewed chronic medical issues:  knee wound - Located over left patella  trauma s/p initial fall 01/2010 - was healing but slowly, very painful requiring addl pain meds then s/p repeat fall, injury and new abrasion to same knee 04/2010. no odor, no fever - following with wound care clinic since 05/26/10 - slow healing  HTN - reports compliance with ongoing medical treatment and no changes in medication dose or frequency. denies adverse side effects related to current therapy.  still feels occ dizzy and reports BP variable readings at home - uses meclizine for same- no syncope  low back pain, chronic - worse symptoms with notable weight gain pain better s/p ESI by dr. Venetia Maxon fall 2010 - but no follow up due to loss of insurance med use of lortab and ultram reviewed - requests supply to hold until next OV  OSA - continues qhs CPAP without change - sleeping reasonable well - no snoring or change in sleep - no daytime fatigue  PCV - taking hydroxyurea without change - no mucosal bleeding or rectal bleeding - no f/c or weight loss - labs done every 12 weeks and OV q 6 mo  dyslipidemia - not on statin - watching closely with cards due to hx of CAD/PVD cards -   Past Medical History  Diagnosis Date  . Chronic low back pain   . OSA (obstructive sleep apnea)     CPAP @ bedtime  . 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 Chronic neck pain   . Hypertension     Review of Systems  Respiratory: Negative for shortness of breath.   Cardiovascular: Negative for chest pain.  Neurological: Negative for headaches.        Objective:   Physical Exam BP 132/80  Pulse 72  Temp(Src) 97.8 F (36.6 C) (Oral)  Ht 5\' 5"  (1.651 m)  Wt 259 lb 12.8 oz (117.845 kg)  BMI 43.23 kg/m2  SpO2 97% Wt Readings from Last 3 Encounters:    10/05/10 259 lb 12.8 oz (117.845 kg)  06/03/10 254 lb (115.214 kg)  05/06/10 247 lb 1.9 oz (112.092 kg)   Constitutional: Obese; oriented to person, place, and time. appears well-developed and well-nourished. No distress.  Neck: Normal range of motion. Neck supple. No JVD present. No thyromegaly present.  Cardiovascular: Normal rate, regular rhythm and normal heart sounds.  No murmur heard.  Chronic 1+ BLE edema Pulmonary/Chest: Effort normal and breath sounds normal. No respiratory distress. no wheezes.  Psychiatric: he has a normal mood and affect. behavior is normal. Judgment and thought content normal.   Lab Results  Component Value Date   WBC 7.1 07/30/2009   HGB 15.6 07/27/2010   HCT 45.0 07/27/2010   PLT 300 07/27/2010   CHOL 143 01/28/2010   TRIG 39.0 01/28/2010   HDL 40.80 01/28/2010   ALT 19 04/30/2009   AST 15 04/30/2009   NA 134* 05/06/2010   K 5.1 05/06/2010   CL 98 05/06/2010   CREATININE 0.94 05/06/2010   BUN 15 05/06/2010   CO2 23 05/06/2010   TSH 2.01 07/30/2009   Lab Results  Component Value Date   LDLCALC 94 01/28/2010       Assessment & Plan:  See problem list; last labs reviewed. Time spent with patient today 25 minutes,  greater than 50% time spent counseling patient on chronic back/neck pain, wound care L knee and medication review. Also review of prior records

## 2010-10-05 NOTE — Assessment & Plan Note (Signed)
The current medical regimen is effective;  continue present plan and medications.  Encouraged to also work on diet and exercise  BP Readings from Last 3 Encounters:  10/05/10 132/80  06/03/10 130/80  05/06/10 130/80

## 2010-10-13 ENCOUNTER — Telehealth: Payer: Self-pay

## 2010-10-13 NOTE — Telephone Encounter (Signed)
A user error has taken place: encounter opened in error, closed for administrative reasons.

## 2010-10-19 ENCOUNTER — Encounter (HOSPITAL_BASED_OUTPATIENT_CLINIC_OR_DEPARTMENT_OTHER): Payer: Medicare Other | Attending: Internal Medicine

## 2010-10-19 DIAGNOSIS — L97809 Non-pressure chronic ulcer of other part of unspecified lower leg with unspecified severity: Secondary | ICD-10-CM | POA: Insufficient documentation

## 2010-11-06 ENCOUNTER — Other Ambulatory Visit: Payer: Self-pay | Admitting: *Deleted

## 2010-11-06 DIAGNOSIS — M545 Low back pain: Secondary | ICD-10-CM

## 2010-11-06 MED ORDER — HYDROCODONE-ACETAMINOPHEN 7.5-500 MG PO TABS: 1.0000 | ORAL_TABLET | Freq: Four times a day (QID) | ORAL | Status: DC | PRN

## 2010-11-06 NOTE — Telephone Encounter (Signed)
Faxed script back to [facility] pharmacy @ [phone]....11/06/10@11 :26am/LMB

## 2010-11-20 ENCOUNTER — Encounter (HOSPITAL_BASED_OUTPATIENT_CLINIC_OR_DEPARTMENT_OTHER): Payer: Medicare Other | Admitting: Hematology and Oncology

## 2010-11-20 ENCOUNTER — Other Ambulatory Visit: Payer: Self-pay | Admitting: Hematology and Oncology

## 2010-11-20 DIAGNOSIS — D47Z9 Other specified neoplasms of uncertain behavior of lymphoid, hematopoietic and related tissue: Secondary | ICD-10-CM

## 2010-11-20 DIAGNOSIS — D473 Essential (hemorrhagic) thrombocythemia: Secondary | ICD-10-CM

## 2010-11-20 DIAGNOSIS — D45 Polycythemia vera: Secondary | ICD-10-CM

## 2010-11-20 LAB — CBC WITH DIFFERENTIAL/PLATELET
BASO%: 0.4 % (ref 0.0–2.0)
Basophils Absolute: 0 10*3/uL (ref 0.0–0.1)
EOS%: 3.5 % (ref 0.0–7.0)
HCT: 45.4 % (ref 38.4–49.9)
HGB: 15.4 g/dL (ref 13.0–17.1)
MCH: 38.1 pg — ABNORMAL HIGH (ref 27.2–33.4)
MCHC: 33.9 g/dL (ref 32.0–36.0)
MONO#: 0.3 10*3/uL (ref 0.1–0.9)
NEUT%: 77.6 % — ABNORMAL HIGH (ref 39.0–75.0)
RDW: 13.6 % (ref 11.0–14.6)
WBC: 8.1 10*3/uL (ref 4.0–10.3)
lymph#: 1.2 10*3/uL (ref 0.9–3.3)

## 2010-11-20 LAB — BASIC METABOLIC PANEL
CO2: 21 mEq/L (ref 19–32)
Chloride: 103 mEq/L (ref 96–112)
Potassium: 4.7 mEq/L (ref 3.5–5.3)

## 2010-11-23 ENCOUNTER — Encounter (HOSPITAL_BASED_OUTPATIENT_CLINIC_OR_DEPARTMENT_OTHER): Payer: Medicare Other

## 2010-11-23 ENCOUNTER — Telehealth: Payer: Self-pay | Admitting: *Deleted

## 2010-11-23 DIAGNOSIS — M545 Low back pain: Secondary | ICD-10-CM

## 2010-11-23 NOTE — Telephone Encounter (Signed)
Pt left msg requesting another referral to see Neurosurgeon was seeing Dr. Venetia Maxon. Want to see Dr. Marikay Alar.Fernando KitchenMarland KitchenMarland Kitchen9/10/12@11 :40am/LMB

## 2010-11-23 NOTE — Telephone Encounter (Signed)
Notified pt with md response. Did inform pt once referrral has been set-up will be contacted by [facility] with appt, date and time.Marland KitchenMarland Kitchen9/10/12@1 :43pm/LMB

## 2010-11-23 NOTE — Telephone Encounter (Signed)
Ok - refer to dr. Yetta Barre nsurg done as requested

## 2010-12-01 ENCOUNTER — Other Ambulatory Visit: Payer: Self-pay | Admitting: *Deleted

## 2010-12-01 DIAGNOSIS — I10 Essential (primary) hypertension: Secondary | ICD-10-CM

## 2010-12-01 MED ORDER — TRAMADOL HCL 50 MG PO TABS: 50.0000 mg | ORAL_TABLET | Freq: Four times a day (QID) | ORAL | Status: DC | PRN

## 2010-12-01 MED ORDER — METOPROLOL SUCCINATE ER 25 MG PO TB24: 25.0000 mg | ORAL_TABLET | ORAL | Status: DC

## 2010-12-01 MED ORDER — LISINOPRIL-HYDROCHLOROTHIAZIDE 20-12.5 MG PO TABS: 1.0000 | ORAL_TABLET | Freq: Every day | ORAL | Status: DC

## 2010-12-01 MED ORDER — NAPROXEN 500 MG PO TABS: 500.0000 mg | ORAL_TABLET | Freq: Every day | ORAL | Status: DC

## 2010-12-14 ENCOUNTER — Ambulatory Visit: Payer: Medicare Other | Admitting: Cardiology

## 2010-12-21 ENCOUNTER — Encounter (HOSPITAL_BASED_OUTPATIENT_CLINIC_OR_DEPARTMENT_OTHER): Payer: Medicare Other

## 2011-01-04 ENCOUNTER — Telehealth: Payer: Self-pay | Admitting: *Deleted

## 2011-01-04 NOTE — Telephone Encounter (Signed)
I will review once here - thanks

## 2011-01-04 NOTE — Telephone Encounter (Signed)
Received vm from pt stating md will be getting some forms from [employer]. Wanted to inform md that he is wanting her to fill-out...01/04/11@4 :28pm/LMB

## 2011-01-05 ENCOUNTER — Other Ambulatory Visit: Payer: Self-pay | Admitting: Internal Medicine

## 2011-01-26 ENCOUNTER — Other Ambulatory Visit: Payer: Self-pay | Admitting: *Deleted

## 2011-01-26 MED ORDER — NYSTATIN 100000 UNIT/GM EX POWD: CUTANEOUS | Status: DC

## 2011-01-26 NOTE — Telephone Encounter (Signed)
Nystatin powder - erx done

## 2011-01-26 NOTE — Telephone Encounter (Signed)
Notified pt with md recommendation. rx already sent to pharmacy...01/26/11@3 :54pm/LMB

## 2011-01-26 NOTE — Telephone Encounter (Signed)
Pt states he has jock itch between testicles area. Was told by pharmacist he can use Tinactn spray ovc, but pt states rash is burning like fire, Recommending md to rx something else...01/26/11@3 :12pm/LMB

## 2011-02-01 ENCOUNTER — Ambulatory Visit: Payer: Medicare Other | Admitting: Cardiology

## 2011-02-03 ENCOUNTER — Other Ambulatory Visit: Payer: Self-pay | Admitting: *Deleted

## 2011-02-03 DIAGNOSIS — D45 Polycythemia vera: Secondary | ICD-10-CM

## 2011-02-03 MED ORDER — HYDROXYUREA 500 MG PO CAPS: 500.0000 mg | ORAL_CAPSULE | Freq: Every day | ORAL | Status: DC

## 2011-02-08 ENCOUNTER — Other Ambulatory Visit: Payer: Self-pay | Admitting: Internal Medicine

## 2011-02-08 ENCOUNTER — Other Ambulatory Visit: Payer: Self-pay | Admitting: Neurological Surgery

## 2011-02-08 DIAGNOSIS — M545 Low back pain, unspecified: Secondary | ICD-10-CM

## 2011-02-08 DIAGNOSIS — M542 Cervicalgia: Secondary | ICD-10-CM

## 2011-02-09 ENCOUNTER — Other Ambulatory Visit: Payer: Self-pay | Admitting: *Deleted

## 2011-02-09 DIAGNOSIS — M545 Low back pain, unspecified: Secondary | ICD-10-CM

## 2011-02-09 NOTE — Telephone Encounter (Signed)
Pt states he has switch his pharmacy from [facility] to [facility] on [address]. Requesting refill on his lortab for December. He states his f/u appt is not until 04/01/11....02/09/11@2 :17pm/LMB

## 2011-02-10 ENCOUNTER — Other Ambulatory Visit: Payer: Self-pay | Admitting: Internal Medicine

## 2011-02-10 MED ORDER — HYDROCODONE-ACETAMINOPHEN 7.5-500 MG PO TABS: 1.0000 | ORAL_TABLET | Freq: Three times a day (TID) | ORAL | Status: DC | PRN

## 2011-02-10 NOTE — Telephone Encounter (Signed)
Caleld pt no answer x's 10 rings....02/10/11@10 :28am/LMB

## 2011-02-10 NOTE — Telephone Encounter (Signed)
Patient should not yet be out of Lortab as last prescription had 4 refills provided August 23 - remind patient that he is not to take more than 3 daily and no additional refills will be provided prior to his office visit in January.

## 2011-02-10 NOTE — Telephone Encounter (Signed)
Notified pt with md response...02/10/11@3 :47pm/LMB

## 2011-02-17 ENCOUNTER — Telehealth: Payer: Self-pay | Admitting: *Deleted

## 2011-02-17 ENCOUNTER — Other Ambulatory Visit: Payer: Self-pay | Admitting: Internal Medicine

## 2011-02-17 NOTE — Telephone Encounter (Signed)
Notified pt with md response. Pt states they are going to fax [employer] forms back to md...02/17/11@2 :55pm/LMB

## 2011-02-17 NOTE — Telephone Encounter (Signed)
I am not certain what forms he is specifically requesting. I have completed and signed the orders for his orthopedic back supplies

## 2011-02-17 NOTE — Telephone Encounter (Signed)
Pt is requesting [employer] forms to be completed...02/17/11@9 :12am/LMB

## 2011-02-19 ENCOUNTER — Other Ambulatory Visit: Payer: Medicare Other | Admitting: Lab

## 2011-02-19 ENCOUNTER — Telehealth: Payer: Self-pay | Admitting: *Deleted

## 2011-02-19 NOTE — Telephone Encounter (Signed)
patient called in and rescheduled his lab appointment to 03-04-2011 at 8:30am patient confirmed over the phone the new date and time

## 2011-03-02 ENCOUNTER — Other Ambulatory Visit: Payer: Self-pay | Admitting: *Deleted

## 2011-03-02 DIAGNOSIS — I10 Essential (primary) hypertension: Secondary | ICD-10-CM

## 2011-03-02 DIAGNOSIS — G4733 Obstructive sleep apnea (adult) (pediatric): Secondary | ICD-10-CM

## 2011-03-02 MED ORDER — MECLIZINE HCL 12.5 MG PO TABS: 12.5000 mg | ORAL_TABLET | ORAL | Status: DC | PRN

## 2011-03-02 MED ORDER — MUPIROCIN 2 % EX OINT: TOPICAL_OINTMENT | Freq: Two times a day (BID) | CUTANEOUS | Status: DC

## 2011-03-03 ENCOUNTER — Other Ambulatory Visit: Payer: Medicare Other

## 2011-03-04 ENCOUNTER — Ambulatory Visit: Payer: Medicare Other | Admitting: Cardiology

## 2011-03-04 ENCOUNTER — Other Ambulatory Visit: Payer: Medicare Other | Admitting: Lab

## 2011-03-06 ENCOUNTER — Other Ambulatory Visit: Payer: Medicare Other

## 2011-03-08 ENCOUNTER — Other Ambulatory Visit: Payer: Self-pay

## 2011-03-08 DIAGNOSIS — M545 Low back pain: Secondary | ICD-10-CM

## 2011-03-08 MED ORDER — HYDROCODONE-ACETAMINOPHEN 7.5-500 MG PO TABS: 1.0000 | ORAL_TABLET | Freq: Three times a day (TID) | ORAL | Status: DC | PRN

## 2011-03-08 NOTE — Telephone Encounter (Signed)
Rx faxed to pharmacy  

## 2011-03-13 ENCOUNTER — Other Ambulatory Visit: Payer: Medicare Other

## 2011-03-15 ENCOUNTER — Telehealth: Payer: Self-pay | Admitting: Hematology and Oncology

## 2011-03-15 NOTE — Telephone Encounter (Signed)
Returned pt's call and r/s 12/20 lb to 1/17 per pt request. Pt also given march appts for 3/20 adn 3/22. Dates/times per pt due to transportation and money situation.

## 2011-03-30 ENCOUNTER — Other Ambulatory Visit: Payer: Medicare Other

## 2011-04-01 ENCOUNTER — Encounter: Payer: Self-pay | Admitting: Internal Medicine

## 2011-04-01 ENCOUNTER — Other Ambulatory Visit (HOSPITAL_BASED_OUTPATIENT_CLINIC_OR_DEPARTMENT_OTHER): Payer: Medicare Other | Admitting: Lab

## 2011-04-01 ENCOUNTER — Ambulatory Visit (INDEPENDENT_AMBULATORY_CARE_PROVIDER_SITE_OTHER): Payer: Medicare Other | Admitting: Internal Medicine

## 2011-04-01 ENCOUNTER — Other Ambulatory Visit (INDEPENDENT_AMBULATORY_CARE_PROVIDER_SITE_OTHER): Payer: Medicare Other

## 2011-04-01 DIAGNOSIS — E785 Hyperlipidemia, unspecified: Secondary | ICD-10-CM

## 2011-04-01 DIAGNOSIS — D47Z9 Other specified neoplasms of uncertain behavior of lymphoid, hematopoietic and related tissue: Secondary | ICD-10-CM

## 2011-04-01 DIAGNOSIS — M545 Low back pain, unspecified: Secondary | ICD-10-CM

## 2011-04-01 DIAGNOSIS — D45 Polycythemia vera: Secondary | ICD-10-CM

## 2011-04-01 DIAGNOSIS — I10 Essential (primary) hypertension: Secondary | ICD-10-CM

## 2011-04-01 DIAGNOSIS — N529 Male erectile dysfunction, unspecified: Secondary | ICD-10-CM | POA: Insufficient documentation

## 2011-04-01 DIAGNOSIS — D473 Essential (hemorrhagic) thrombocythemia: Secondary | ICD-10-CM

## 2011-04-01 DIAGNOSIS — IMO0002 Reserved for concepts with insufficient information to code with codable children: Secondary | ICD-10-CM

## 2011-04-01 LAB — CBC WITH DIFFERENTIAL/PLATELET
Basophils Absolute: 0 10*3/uL (ref 0.0–0.1)
EOS%: 2.3 % (ref 0.0–7.0)
Eosinophils Absolute: 0.2 10*3/uL (ref 0.0–0.5)
HGB: 16.2 g/dL (ref 13.0–17.1)
LYMPH%: 12.4 % — ABNORMAL LOW (ref 14.0–49.0)
MCH: 38.6 pg — ABNORMAL HIGH (ref 27.2–33.4)
MCV: 112.1 fL — ABNORMAL HIGH (ref 79.3–98.0)
MONO%: 3.2 % (ref 0.0–14.0)
NEUT#: 7.9 10*3/uL — ABNORMAL HIGH (ref 1.5–6.5)
Platelets: 452 10*3/uL — ABNORMAL HIGH (ref 140–400)
RBC: 4.2 10*6/uL (ref 4.20–5.82)

## 2011-04-01 LAB — LIPID PANEL
Cholesterol: 193 mg/dL (ref 0–200)
Triglycerides: 148 mg/dL (ref 0.0–149.0)
VLDL: 29.6 mg/dL (ref 0.0–40.0)

## 2011-04-01 MED ORDER — TADALAFIL 5 MG PO TABS: 5.0000 mg | ORAL_TABLET | Freq: Every day | ORAL | Status: AC | PRN

## 2011-04-01 MED ORDER — HYDROCODONE-ACETAMINOPHEN 7.5-500 MG PO TABS: 1.0000 | ORAL_TABLET | Freq: Three times a day (TID) | ORAL | Status: DC | PRN

## 2011-04-01 NOTE — Assessment & Plan Note (Signed)
Uses Cialis prn with good results

## 2011-04-01 NOTE — Assessment & Plan Note (Signed)
The current medical regimen is effective;  continue present plan and medications.  Encouraged to also work on diet and exercise  BP Readings from Last 3 Encounters:  04/01/11 110/62  10/05/10 132/80  06/03/10 130/80

## 2011-04-01 NOTE — Assessment & Plan Note (Signed)
Initial injury 01/2010, then re-injury 04/2010 Following with wound care center since 05/2010-12/2010 - slow progress due to hydroxyurea use (per pt) Continue home self care of wound as ongoing

## 2011-04-01 NOTE — Assessment & Plan Note (Signed)
Last lipids reviewed, check annually - acceptable per cards review 01/2010 Resume work on diet/lifestyle control of same Lab Results  Component Value Date   LDLCALC 94 01/28/2010

## 2011-04-01 NOTE — Progress Notes (Signed)
Subjective:    Patient ID: Fernando Lane, male    DOB: 1950/10/31, 61 y.o.   MRN: [id number]  HPI  Here for follow up - reviewed chronic medical issues:  knee wound - Located over left patella  trauma s/p initial fall 01/2010 - was healing but slowly, very painful requiring addl pain meds then s/p repeat fall, injury and new abrasion to same knee 04/2010. no odor, no fever - following with wound care clinic since 05/26/10 - slow healing  HTN - reports compliance with ongoing medical treatment and no changes in medication dose or frequency. denies adverse side effects related to current therapy.  still feels occ dizzy and reports BP variable readings at home - uses meclizine for same- no syncope  low back pain, chronic - worse symptoms with notable weight gain pain better s/p ESI by dr. Venetia Maxon fall 2010 -  Saw Dr Yetta Barre 01/2011 > planning repeat MRI c-spine this month for increase L shoulder pain symptoms  med use of lortab and ultram reviewed -   OSA - continues qhs CPAP without change - sleeping reasonable well - no snoring or change in sleep - no daytime fatigue  PCV - taking hydroxyurea without change - no mucosal bleeding or rectal bleeding - no f/c or weight loss - labs done every 12 weeks and OV q 6 mo  dyslipidemia - not on statin - watching closely with cards due to hx of CAD/PVD cards -   Past Medical History  Diagnosis Date  . Chronic low back pain   . OSA (obstructive sleep apnea)     CPAP @ bedtime  . 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 Chronic neck pain   . Hypertension     Review of Systems  Respiratory: Negative for cough and shortness of breath.   Cardiovascular: Negative for chest pain and leg swelling.  Neurological: Negative for headaches.        Objective:   Physical Exam  BP 110/62  Pulse 84  Temp(Src) 98.9 F (37.2 C) (Oral)  Wt 254 lb (115.214 kg)  SpO2 96% Wt Readings from Last 3  Encounters:  04/01/11 254 lb (115.214 kg)  10/05/10 259 lb 12.8 oz (117.845 kg)  06/03/10 254 lb (115.214 kg)   Constitutional: Obese; appears well-developed and well-nourished. No distress.  Neck: Normal range of motion. Neck supple. No JVD present. No thyromegaly present.  Cardiovascular: Normal rate, regular rhythm and normal heart sounds.  No murmur heard.  Chronic 1+ BLE edema Pulmonary/Chest: Effort normal and breath sounds normal. No respiratory distress. no wheezes.  Skin: L knee stage 1 ulceration over patella - 3.6cm round, no eschar or infection Psychiatric: he has a normal mood and affect. behavior is normal. Judgment and thought content normal.   Lab Results  Component Value Date   WBC 8.1 11/20/2010   HGB 15.4 11/20/2010   HCT 45.4 11/20/2010   PLT 348 11/20/2010   CHOL 143 01/28/2010   TRIG 39.0 01/28/2010   HDL 40.80 01/28/2010   ALT 19 04/30/2009   AST 15 04/30/2009   NA 137 11/20/2010   K 4.7 11/20/2010   CL 103 11/20/2010   CREATININE 0.87 11/20/2010   BUN 21 11/20/2010   CO2 21 11/20/2010   TSH 2.01 07/30/2009   Lab Results  Component Value Date   LDLCALC 94 01/28/2010       Assessment & Plan:  See problem list. Medications and labs reviewed today.

## 2011-04-01 NOTE — Patient Instructions (Signed)
It was good to see you today. Medications reviewed, no changes at this time. Refill on medication(s) as discussed today.  Test(s) ordered today. Your results will be called to you after review (48-72hours after test completion). If any changes need to be made, you will be notified at that time. Continue work on lifestyle changes as discussed (low fat, low carb, increased protein diet; improved exercise efforts; weight loss) to control sugar, blood pressure and cholesterol levels and/or reduce risk of developing other medical problems.  Please schedule followup in 6 months, call sooner if problems.

## 2011-04-01 NOTE — Assessment & Plan Note (Signed)
Chronic narcotic use to control same symptoms C subst registry reviewed - cont same - refills done 

## 2011-04-02 ENCOUNTER — Telehealth: Payer: Self-pay

## 2011-04-02 ENCOUNTER — Telehealth: Payer: Self-pay | Admitting: *Deleted

## 2011-04-02 NOTE — Telephone Encounter (Signed)
[facility] pharmacy called stating pt is transferring Rx from [facility] to their pharmacy and they needed clarification on Lortab direction. Pt told pharmacy 1q6h prn. Pharmacy [facility] advised that directions per MD at OV 04/01/2011 1q8h prn

## 2011-04-02 NOTE — Telephone Encounter (Signed)
Dr. Dalene Carrow reviewed lab results from 04/01/11.   Attempted to contact pt  Several times  Unsuccessfully.    Unable to leave message on home voice mail due to no voice mail set up.   Called pt on cell phone as well as daughter's phone listed  -  Incorrect  Phone numbers.

## 2011-04-05 ENCOUNTER — Ambulatory Visit: Payer: Medicare Other | Admitting: Internal Medicine

## 2011-04-05 ENCOUNTER — Other Ambulatory Visit: Payer: Self-pay | Admitting: *Deleted

## 2011-04-05 ENCOUNTER — Telehealth: Payer: Self-pay | Admitting: *Deleted

## 2011-04-05 DIAGNOSIS — D751 Secondary polycythemia: Secondary | ICD-10-CM

## 2011-04-05 NOTE — Telephone Encounter (Signed)
Spoke with pt today and instructed pt to continue with  Hydrea  500 mg  Alternate with  1000 mg  As per md's instructions.   Gave pt date and time for next lab on 05/05/11  At  0800 am.    Pt voiced understanding. Pt's   Phone     [phone].

## 2011-04-06 ENCOUNTER — Ambulatory Visit
Admission: RE | Admit: 2011-04-06 | Discharge: 2011-04-06 | Disposition: A | Discharge status: Home / Self Care | Payer: Medicare Other | Source: Ambulatory Visit | Attending: Neurological Surgery | Admitting: Neurological Surgery

## 2011-04-06 DIAGNOSIS — M542 Cervicalgia: Secondary | ICD-10-CM

## 2011-04-06 DIAGNOSIS — M545 Low back pain: Secondary | ICD-10-CM

## 2011-04-07 ENCOUNTER — Ambulatory Visit: Payer: Medicare Other | Admitting: Cardiology

## 2011-04-08 ENCOUNTER — Ambulatory Visit: Payer: Medicare Other | Admitting: Cardiology

## 2011-04-23 ENCOUNTER — Other Ambulatory Visit: Payer: Self-pay | Admitting: Internal Medicine

## 2011-04-26 ENCOUNTER — Other Ambulatory Visit: Payer: Self-pay | Admitting: Internal Medicine

## 2011-04-27 NOTE — Telephone Encounter (Signed)
Faxed script back to [facility]... 04/27/11@2 :10pm/LMB

## 2011-04-29 ENCOUNTER — Encounter: Payer: Self-pay | Admitting: *Deleted

## 2011-05-05 ENCOUNTER — Other Ambulatory Visit: Payer: Medicare Other | Admitting: Lab

## 2011-05-05 ENCOUNTER — Ambulatory Visit: Payer: Medicare Other | Admitting: Cardiology

## 2011-05-06 ENCOUNTER — Other Ambulatory Visit: Payer: Self-pay | Admitting: Internal Medicine

## 2011-05-07 ENCOUNTER — Ambulatory Visit: Payer: Medicare Other | Admitting: Cardiology

## 2011-05-07 ENCOUNTER — Ambulatory Visit: Payer: Self-pay | Admitting: Family Medicine

## 2011-05-07 ENCOUNTER — Other Ambulatory Visit: Payer: Medicare Other | Admitting: Lab

## 2011-05-10 ENCOUNTER — Other Ambulatory Visit: Payer: Medicare Other | Admitting: Lab

## 2011-05-14 ENCOUNTER — Other Ambulatory Visit: Payer: Medicare Other | Admitting: Lab

## 2011-05-14 ENCOUNTER — Telehealth: Payer: Self-pay | Admitting: Hematology and Oncology

## 2011-05-14 ENCOUNTER — Telehealth: Payer: Self-pay | Admitting: Nurse Practitioner

## 2011-05-14 NOTE — Telephone Encounter (Signed)
cx'd 3/1 lab per pt due to transport issues - per pt will call back monday to r/s when he gets his own personal car back - pt is aware of 3/20 lb/fu appt - lm for desk nurse.

## 2011-05-14 NOTE — Telephone Encounter (Signed)
Received notice from Melissa that pt has rescheduled lab appointment several times.  Lab appt originally scheduled for 2/22, now schedule for 3/20 per pt request.  States pt reports transportation and financial difficulties.  RN attempted to call pt to relay importance of keeping lab appointments as scheduled.  Was unable to reach patient.  No answer at number listed.

## 2011-05-27 ENCOUNTER — Ambulatory Visit (INDEPENDENT_AMBULATORY_CARE_PROVIDER_SITE_OTHER): Payer: Medicare Other | Admitting: Cardiology

## 2011-05-27 ENCOUNTER — Encounter: Payer: Self-pay | Admitting: Cardiology

## 2011-05-27 VITALS — BP 120/78 | HR 70 | Ht 66.0 in | Wt 259.0 lb

## 2011-05-27 DIAGNOSIS — E785 Hyperlipidemia, unspecified: Secondary | ICD-10-CM

## 2011-05-27 DIAGNOSIS — E7800 Pure hypercholesterolemia, unspecified: Secondary | ICD-10-CM

## 2011-05-27 DIAGNOSIS — I10 Essential (primary) hypertension: Secondary | ICD-10-CM

## 2011-05-27 MED ORDER — ATORVASTATIN CALCIUM 20 MG PO TABS: 20.0000 mg | ORAL_TABLET | Freq: Every day | ORAL | Status: DC

## 2011-05-27 NOTE — Patient Instructions (Signed)
Start atorvastatin (lipitor) 20mg  daily for your cholesterol.   Your physician recommends that you return for a FASTING lipid profile /liver profile in 2 months.    Your physician wants you to follow-up in: 1 year with Dr Shirlee Latch. You will receive a reminder letter in the mail two months in advance. If you don't receive a letter, please call our office to schedule the follow-up appointment.

## 2011-05-31 NOTE — Assessment & Plan Note (Signed)
BP is under good control.  

## 2011-05-31 NOTE — Assessment & Plan Note (Signed)
Elevated LDL. I spoke to Mr Eckert for a while today about his cardiac risk and lipid level.  I am most concerned that his father had an MI in his 73s.  We decided to start atorvastatin 20 mg daily.  Would like to see LDL < 70.  Check lipids/LFTs in 2 months.

## 2011-05-31 NOTE — Progress Notes (Signed)
PCP: Dr. Felicity Coyer  61 yo with history of HTN, OSA,and polycythemia vera presents for cardiology followup. He has had no cardiac problems since I last saw him. No chest pain.  No significant exertional dyspnea but exertion is limited by knee and back pain. He can get up a flight of steps but it does bother his knee a lot. BP has been well-controlled on current regimen.    ECG: NSR, normal   Labs (3/10): creatinine 1.0, LDL 89  Labs (5/11): creatinine 0.8, LDL 125, HDL 39, TSH normal  Labs (1/61): K 4.7, creatinine 0.87 Labs (1/13): LDL 125, HDL 38  No Known Drug Allergies   Past Medical History:  1. Low back pain  2. HTN  3. OSA - CPAP at bedtime  4. 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 L knee replacement in 2007 with chronic L knee pain  6. Stress myoview (7/08) in [place], [place]: small reversible inferior perfusion defect. EF 65%. Possible attenuation.  7. Chronic neck pain with history of c-spine surgery.  8. Obesity.  9. Lower extremity doppler US 2008: no evidence for PAD.  ABIs (11/11): normal.  10. Echo (9/10): EF 55-60%, normal valves, mild LAE  11. S/p left TKR  Family History:  mom - died late 20s  dad - died late 54s or early 41s due to massive MI  no cancer hx   Social History:  lives alone (divorced).  daughter lives in town.  disabled  former management/construction  quit cigars/chew - 1990  quit cigarettes 1970s   Review of Systems  All systems reviewed and negative except as per HPI.   Current Outpatient Prescriptions  Medication Sig Dispense Refill  . aspirin 81 MG tablet Take 81 mg by mouth daily.        Marland Kitchen HYDROcodone-acetaminophen (LORTAB) 7.5-500 MG per tablet TAKE ONE TABLET BY MOUTH EVERY 8 HOURS AS NEEDED FOR PAIN  90 tablet  3  . hydroxyurea (HYDREA) 500 MG capsule Take 1 capsule (500 mg total) by mouth daily. Take 1 capsule by mouth every other day alternating with 2 capsule every other day. May take with food to minimize GI side  effects.  100 capsule  2  . lisinopril-hydrochlorothiazide (PRINZIDE,ZESTORETIC) 20-12.5 MG per tablet TAKE ONE TABLET BY MOUTH EVERY DAY  90 tablet  3  . meclizine (ANTIVERT) 12.5 MG tablet Take 1 tablet (12.5 mg total) by mouth every 4 (four) hours as needed.  30 tablet  5  . metoprolol succinate (TOPROL-XL) 25 MG 24 hr tablet TAKE ONE TABLET BY MOUTH EVERY DAY IN THE MORNING  30 tablet  5  . mupirocin ointment (BACTROBAN) 2 % APPLY TOPICALLY TWICE DAILY  22 g  1  . naproxen (NAPROSYN) 500 MG tablet TAKE ONE TABLET BY MOUTH EVERY DAY  30 tablet  1  . nystatin (MYCOSTATIN) powder Apply to affected area 3 times daily  15 g  0  . traMADol (ULTRAM) 50 MG tablet TAKE ONE TABLET BY MOUTH EVERY 6 HOURS AS NEEDED FOR MODERATE PAIN  120 tablet  0  . atorvastatin (LIPITOR) 20 MG tablet Take 1 tablet (20 mg total) by mouth daily.  30 tablet  3   BP 120/78  Pulse 70  Ht 5\' 6"  (1.676 m)  Wt 259 lb (117.482 kg)  BMI 41.80 kg/m2 General: NAD Neck: Thick, no JVD, no thyromegaly or thyroid nodule.  Lungs: Clear to auscultation bilaterally with normal respiratory effort. CV: Nondisplaced PMI.  Heart regular S1/S2, no  S3/S4, no murmur.  1+ ankle edema bilaterally.  No carotid bruit.  Normal pedal pulses.  Abdomen: Soft, nontender, no hepatosplenomegaly, no distention.  Neurologic: Alert and oriented x 3.  Psych: Normal affect. Extremities: No clubbing or cyanosis.

## 2011-06-02 ENCOUNTER — Telehealth: Payer: Self-pay | Admitting: *Deleted

## 2011-06-02 ENCOUNTER — Other Ambulatory Visit (HOSPITAL_BASED_OUTPATIENT_CLINIC_OR_DEPARTMENT_OTHER): Payer: Medicare Other | Admitting: Lab

## 2011-06-02 DIAGNOSIS — D45 Polycythemia vera: Secondary | ICD-10-CM

## 2011-06-02 LAB — BASIC METABOLIC PANEL
BUN: 12 mg/dL (ref 6–23)
CO2: 26 mEq/L (ref 19–32)
Chloride: 100 mEq/L (ref 96–112)
Glucose, Bld: 107 mg/dL — ABNORMAL HIGH (ref 70–99)
Potassium: 5.4 mEq/L — ABNORMAL HIGH (ref 3.5–5.3)

## 2011-06-02 LAB — CBC WITH DIFFERENTIAL/PLATELET
Basophils Absolute: 0 10*3/uL (ref 0.0–0.1)
Eosinophils Absolute: 0.2 10*3/uL (ref 0.0–0.5)
HGB: 15.2 g/dL (ref 13.0–17.1)
MCV: 114.5 fL — ABNORMAL HIGH (ref 79.3–98.0)
MONO#: 0.2 10*3/uL (ref 0.1–0.9)
MONO%: 2.9 % (ref 0.0–14.0)
NEUT#: 4.7 10*3/uL (ref 1.5–6.5)
RDW: 14.8 % — ABNORMAL HIGH (ref 11.0–14.6)
WBC: 6.1 10*3/uL (ref 4.0–10.3)

## 2011-06-02 NOTE — Telephone Encounter (Signed)
Spoke with pt today and confirmed date and time for f/u appt on 3/22  At  0930 am.   Informed pt that he needed to keep this appt .   Pt voiced understanding.

## 2011-06-04 ENCOUNTER — Telehealth: Payer: Self-pay | Admitting: Hematology and Oncology

## 2011-06-04 ENCOUNTER — Ambulatory Visit (HOSPITAL_BASED_OUTPATIENT_CLINIC_OR_DEPARTMENT_OTHER): Payer: Medicare Other | Admitting: Physician Assistant

## 2011-06-04 ENCOUNTER — Ambulatory Visit: Payer: Medicare Other | Admitting: Hematology and Oncology

## 2011-06-04 VITALS — BP 148/83 | HR 75 | Temp 98.2°F | Ht 66.0 in | Wt 262.9 lb

## 2011-06-04 DIAGNOSIS — D696 Thrombocytopenia, unspecified: Secondary | ICD-10-CM

## 2011-06-04 DIAGNOSIS — D45 Polycythemia vera: Secondary | ICD-10-CM

## 2011-06-04 DIAGNOSIS — D751 Secondary polycythemia: Secondary | ICD-10-CM

## 2011-06-04 DIAGNOSIS — D47Z9 Other specified neoplasms of uncertain behavior of lymphoid, hematopoietic and related tissue: Secondary | ICD-10-CM

## 2011-06-04 NOTE — Progress Notes (Signed)
CC:   Valerie A. Felicity Coyer, MD Marca Ancona, MD  IDENTIFYING STATEMENT:  Mr. Keymarion Bearman is a 61 year old white male with polycythemia vera who presents for followup.  INTERIM HISTORY:  Mr. Fiumara reports since his last clinic visit in September of 2012 he has had occasional fatigue, but able to complete ADLs.  He continues to have issues with unsteady gait and uses a cane and walker for assistance with ambulation.  He also states he has some intermittent knee discomfort and this is controlled with p.r.n. pain medications.  He was recently evaluated by his cardiologist about a week ago and was started on Lipitor for cholesterol.  He states she will have repeat labs in a couple of months for further evaluation after starting this new medication.  He has had no other changes in medication.  He reports normal appetite, and is having no issues with nausea, vomiting, constipation, or diarrhea.  No dysuria, no frequency, or hematuria.  No increased swelling of extremities.  He does continue to report occasional lower extremity swelling bilaterally, and he elevates his extremities whenever he is at home resting with some relief of symptoms.  PHYSICAL EXAM:  Temperature is 98.2, heart rate 75, respirations 20, blood pressure 148/83, weight is 262.9 pounds.  General:  This is a well- developed, well-nourished, white male in no acute distress.  HEENT: Sclerae nonicteric.  There is no oral thrush or mucositis.  Skin:  No rashes or lesions.  He does have a few excoriated areas noted on his arms.  Lymph:  No cervical, supraclavicular, axillary, or inguinal lymphadenopathy.  Cardiac:  Regular rate and rhythm without murmurs or gallops.  Peripheral pulses are palpable.  Chest:  Lungs clear to auscultation.  Abdomen:  Positive bowel sounds, soft, nontender, nondistended.  No organomegaly.  Extremities:  Bilateral dependent lower extremity edema without cyanosis or calf tenderness.  Neurologic:   Alert and oriented x3 with unsteady gait noted.  Strength and sensation is intact.  LABORATORY DATA:  Laboratory data from June 02, 2011:  CBC with diff reveals white blood count of 6.1, hemoglobin 15.2, hematocrit 44.2, platelets of 383, ANC of 4.7, MCV of 114.5.  Chemistries reveal a sodium of 135, potassium 5.4, chloride 100, BUN of 12, creatinine 0.87, glucose of 107.  IMPRESSION/PLAN: 1. Mr. Reichen Hutzler is a 61 year old white male with     myeloproliferative disorder with polycythemia vera and essential     thrombocytosis with JAK2 positivity.  He is currently maintained on     hydroxyurea 500 mg alternating with 1000 mg p.o. daily as well as     daily baby aspirin.  He has not required phlebotomy since March of     2012 and has continued to have stable labs on current regimen.  We     will continue current dose of hydroxyurea.  The patient will be     scheduled for followup labs consisting of CBC with diff in 3     month's time and will follow up with Dr. Dalene Carrow in 6 month's time     at which time we will reassess CBC with diff and BMET.  He is     advised to call in the interim if any questions or problems. 2. Also of note, the patient had slightly elevated potassium on his     last labs.  He is not on any oral potassium at this time and will     follow up with his primary for further evaluation and  management.   ______________________________ Michail Sermon, MSN, ANP, BC RH/MEDQ  D:  06/04/2011  T:  06/04/2011  Job:  [id number]

## 2011-06-04 NOTE — Progress Notes (Signed)
This office note has been dictated.

## 2011-06-04 NOTE — Telephone Encounter (Signed)
Gv pt appt for [id number]

## 2011-06-11 ENCOUNTER — Other Ambulatory Visit: Payer: Self-pay | Admitting: Internal Medicine

## 2011-07-10 ENCOUNTER — Other Ambulatory Visit: Payer: Self-pay | Admitting: Internal Medicine

## 2011-07-13 ENCOUNTER — Telehealth: Payer: Self-pay | Admitting: *Deleted

## 2011-07-13 NOTE — Telephone Encounter (Signed)
Left msg on vm want to let md know should be receiving order from [employer] for a (L) knee brace... 07/13/11@4 :31pm/LMB

## 2011-07-14 NOTE — Telephone Encounter (Signed)
Will review when it arrives

## 2011-07-19 ENCOUNTER — Telehealth: Payer: Self-pay | Admitting: *Deleted

## 2011-07-19 NOTE — Telephone Encounter (Signed)
Pt call back [facility] apologizing and states he spoke with the office manager... 07/19/11@4 :53pm/LMB

## 2011-07-19 NOTE — Telephone Encounter (Signed)
I will complete forms for knee brace when i receive them - (prior wound from accidental fall/injury) thanks for the msg and update

## 2011-07-19 NOTE — Telephone Encounter (Signed)
Pt called [facility] wanting to know has md received knee brace order. Called pt back inform he md hasn't received order. Gave fax # to have them to fax brace order to md. Then patient stated have something to say can relay to md if i want. He states he call about leg being affected and was told that he would need appt. Pt states he couldn't make appt had to wait until he get is disability so he come pay. The doctors is more concern about money than helping the patient. Patient became very aggressive and argumentive. He states when he call he always get someone with an attitude. Ask pt would he like to speak with manager because he wasn't going to be yelling and talking to me. Transferred pt to [place]....07/19/11@3 :47pm/LMB

## 2011-07-28 ENCOUNTER — Ambulatory Visit: Payer: Medicare Other | Admitting: Internal Medicine

## 2011-08-02 ENCOUNTER — Other Ambulatory Visit: Payer: Self-pay | Admitting: Neurological Surgery

## 2011-08-02 ENCOUNTER — Other Ambulatory Visit (INDEPENDENT_AMBULATORY_CARE_PROVIDER_SITE_OTHER): Payer: Medicare Other

## 2011-08-02 DIAGNOSIS — E7800 Pure hypercholesterolemia, unspecified: Secondary | ICD-10-CM

## 2011-08-02 DIAGNOSIS — I10 Essential (primary) hypertension: Secondary | ICD-10-CM

## 2011-08-02 LAB — HEPATIC FUNCTION PANEL
Alkaline Phosphatase: 55 U/L (ref 39–117)
Bilirubin, Direct: 0.1 mg/dL (ref 0.0–0.3)
Total Bilirubin: 0.9 mg/dL (ref 0.3–1.2)

## 2011-08-02 LAB — LIPID PANEL
LDL Cholesterol: 64 mg/dL (ref 0–99)
Total CHOL/HDL Ratio: 3
VLDL: 23.4 mg/dL (ref 0.0–40.0)

## 2011-08-18 ENCOUNTER — Telehealth: Payer: Self-pay | Admitting: Cardiology

## 2011-08-18 NOTE — Telephone Encounter (Signed)
Pt to have surgery, spinal surgery, does pt need stress test prior?

## 2011-08-19 NOTE — Telephone Encounter (Signed)
Will forward to Dr McLean for review. 

## 2011-08-19 NOTE — Telephone Encounter (Signed)
Does not need a stress test prior unless he has been having chest pain.

## 2011-08-20 ENCOUNTER — Telehealth: Payer: Self-pay | Admitting: *Deleted

## 2011-08-20 NOTE — Telephone Encounter (Signed)
Pt was advised he did not need stress test before surgery and denies any recent chest pain

## 2011-08-20 NOTE — Telephone Encounter (Signed)
NA

## 2011-08-20 NOTE — Telephone Encounter (Signed)
Unable to get ahold of pt, as # has changed--called daughter's # and LM for them to tell Fernando Lane to give [place] a call

## 2011-08-23 NOTE — Telephone Encounter (Signed)
Pt notified. See phone note dated 08/20/11.

## 2011-08-26 ENCOUNTER — Inpatient Hospital Stay (HOSPITAL_COMMUNITY): Admission: RE | Admit: 2011-08-26 | Payer: Medicare Other | Source: Ambulatory Visit

## 2011-08-26 ENCOUNTER — Encounter (HOSPITAL_COMMUNITY): Payer: Self-pay | Admitting: *Deleted

## 2011-08-26 NOTE — Progress Notes (Signed)
Pt can't remember the name of the hospital he had a Sleep Study done. Stated it was done in [place]. He uses a CPAP at night.

## 2011-08-26 NOTE — Pre-Procedure Instructions (Signed)
Fernando Lane  08/26/2011   Your procedure is scheduled on:  September 30, 2011 Thursday at 0730 AM  Report to [facility] at 0530 AM.  Call this number if you have problems the morning of surgery: [phone]   Remember:   Do not eat food or drink.:After Midnight.Wednesday    Take these medicines the morning of surgery with A SIP OF WATER: Hydrea, Antivert, Toprol-XL, Ultram or Lortab   Do not wear jewelry,   Do not wear lotions, powders, or perfumes. You may wear deodorant.  Do not shave 48 hours prior to surgery. Men may shave face and neck.  Do not bring valuables to the hospital.  Contacts, dentures or bridgework may not be worn into surgery.  Leave suitcase in the car. After surgery it may be brought to your room.  For patients admitted to the hospital, checkout time is 11:00 AM the day of discharge.   Patients discharged the day of surgery will not be allowed to drive home.    Special Instructions: CHG Shower Use Special Wash: 1/2 bottle night before surgery and 1/2 bottle morning of surgery.   Please read over the following fact sheets that you were given: Pain Booklet, Coughing and Deep Breathing, MRSA Information and Surgical Site Infection Prevention

## 2011-08-30 ENCOUNTER — Other Ambulatory Visit (HOSPITAL_COMMUNITY): Payer: Medicare Other

## 2011-09-03 ENCOUNTER — Other Ambulatory Visit: Payer: Medicare Other

## 2011-09-06 ENCOUNTER — Other Ambulatory Visit: Payer: Medicare Other | Admitting: Lab

## 2011-09-07 ENCOUNTER — Other Ambulatory Visit: Payer: Self-pay | Admitting: *Deleted

## 2011-09-07 DIAGNOSIS — I10 Essential (primary) hypertension: Secondary | ICD-10-CM

## 2011-09-07 DIAGNOSIS — E7800 Pure hypercholesterolemia, unspecified: Secondary | ICD-10-CM

## 2011-09-07 DIAGNOSIS — G4733 Obstructive sleep apnea (adult) (pediatric): Secondary | ICD-10-CM

## 2011-09-07 MED ORDER — ATORVASTATIN CALCIUM 20 MG PO TABS: 20.0000 mg | ORAL_TABLET | Freq: Every day | ORAL | Status: DC

## 2011-09-07 MED ORDER — NAPROXEN 500 MG PO TABS: 500.0000 mg | ORAL_TABLET | Freq: Every day | ORAL | Status: DC

## 2011-09-07 MED ORDER — MECLIZINE HCL 12.5 MG PO TABS: 12.5000 mg | ORAL_TABLET | ORAL | Status: DC | PRN

## 2011-09-10 ENCOUNTER — Other Ambulatory Visit: Payer: Medicare Other | Admitting: Lab

## 2011-09-15 ENCOUNTER — Other Ambulatory Visit: Payer: Medicare Other | Admitting: Lab

## 2011-09-20 ENCOUNTER — Other Ambulatory Visit: Payer: Medicare Other | Admitting: Lab

## 2011-09-22 ENCOUNTER — Telehealth: Payer: Self-pay | Admitting: Hematology and Oncology

## 2011-09-22 NOTE — Telephone Encounter (Signed)
pt called in to r/s missed june labs    [facility]

## 2011-09-23 ENCOUNTER — Inpatient Hospital Stay (HOSPITAL_COMMUNITY): Admission: RE | Admit: 2011-09-23 | Payer: Medicare Other | Source: Ambulatory Visit

## 2011-09-24 ENCOUNTER — Other Ambulatory Visit: Payer: Medicare Other | Admitting: Lab

## 2011-09-27 ENCOUNTER — Other Ambulatory Visit: Payer: Medicare Other | Admitting: Lab

## 2011-09-29 ENCOUNTER — Other Ambulatory Visit: Payer: Medicare Other | Admitting: Lab

## 2011-09-30 ENCOUNTER — Encounter (HOSPITAL_COMMUNITY): Admission: RE | Payer: Self-pay | Source: Ambulatory Visit

## 2011-09-30 ENCOUNTER — Inpatient Hospital Stay (HOSPITAL_COMMUNITY): Admission: RE | Admit: 2011-09-30 | Payer: Medicare Other | Source: Ambulatory Visit | Admitting: Neurological Surgery

## 2011-09-30 ENCOUNTER — Ambulatory Visit: Payer: Medicare Other | Admitting: Internal Medicine

## 2011-09-30 SURGERY — POSTERIOR CERVICAL FUSION/FORAMINOTOMY LEVEL 3
Anesthesia: General

## 2011-10-08 ENCOUNTER — Telehealth: Payer: Self-pay | Admitting: Hematology and Oncology

## 2011-10-08 ENCOUNTER — Telehealth: Payer: Self-pay | Admitting: *Deleted

## 2011-10-08 ENCOUNTER — Other Ambulatory Visit (HOSPITAL_BASED_OUTPATIENT_CLINIC_OR_DEPARTMENT_OTHER): Payer: Medicare Other

## 2011-10-08 DIAGNOSIS — D751 Secondary polycythemia: Secondary | ICD-10-CM

## 2011-10-08 LAB — CBC WITH DIFFERENTIAL/PLATELET
Basophils Absolute: 0.1 10*3/uL (ref 0.0–0.1)
EOS%: 1.7 % (ref 0.0–7.0)
Eosinophils Absolute: 0.2 10*3/uL (ref 0.0–0.5)
HCT: 44.6 % (ref 38.4–49.9)
HGB: 14.9 g/dL (ref 13.0–17.1)
MCH: 37.3 pg — ABNORMAL HIGH (ref 27.2–33.4)
MCV: 111.8 fL — ABNORMAL HIGH (ref 79.3–98.0)
MONO%: 3.3 % (ref 0.0–14.0)
NEUT#: 8.8 10*3/uL — ABNORMAL HIGH (ref 1.5–6.5)
NEUT%: 86.5 % — ABNORMAL HIGH (ref 39.0–75.0)

## 2011-10-08 NOTE — Telephone Encounter (Signed)
Dr. Arline Asp reviewed lab results today.  Spoke with pt and instructed pt to continue   Hydrea  500 mg  Alternate  With   1000 mg  Daily,  And continue with  Aspirin 81 mg  Daily  As per Dr. Arline Asp.   Confirmed next appts on  12/07/11 .   Pt voiced understanding.

## 2011-10-08 NOTE — Telephone Encounter (Signed)
Pt came in today after lb to moved 9/20 lb to 9/24. Pt has had trouble getting to appts and needs lb/fu same day. Pt given new schedule for lb/fu 9/24.

## 2011-11-08 ENCOUNTER — Telehealth: Payer: Self-pay | Admitting: *Deleted

## 2011-11-08 NOTE — Telephone Encounter (Signed)
Received call from pt wanting to know re:  If pt had any indications of being a diabetic at his last office visit.   Spoke with pt and was informed that pt developed sore on both sides of legs  with burning sensation.  Pt noticed the sores for about 2 months ;  Pt was advised by his primary to apply  Silvadene cream to the sores.   Instructed pt to f/u with his primary soon due to sores burning more.   Pt stated he has appt with his primary in am on 11/09/11. Message to md for review. Pt's   Phone    [phone].

## 2011-11-09 ENCOUNTER — Telehealth: Payer: Self-pay

## 2011-11-09 NOTE — Telephone Encounter (Signed)
S/w pt that we do not test for DM here. His sugars on cmet were good but there are more specific tests for DM that his PCP can order. His appt to PCP is postponed to next mon b/c his PCP is out of office. Pt did not want to see other MD in office. I instructed if legs get worse to go in and see other MD in the office.

## 2011-11-19 ENCOUNTER — Ambulatory Visit (INDEPENDENT_AMBULATORY_CARE_PROVIDER_SITE_OTHER): Payer: Medicare Other | Admitting: Family Medicine

## 2011-11-19 ENCOUNTER — Ambulatory Visit: Payer: Self-pay

## 2011-11-19 ENCOUNTER — Other Ambulatory Visit: Payer: Self-pay | Admitting: *Deleted

## 2011-11-19 VITALS — BP 130/82 | HR 74 | Temp 98.0°F | Resp 16 | Ht <= 58 in | Wt 245.0 lb

## 2011-11-19 DIAGNOSIS — S81009A Unspecified open wound, unspecified knee, initial encounter: Secondary | ICD-10-CM

## 2011-11-19 DIAGNOSIS — S81809A Unspecified open wound, unspecified lower leg, initial encounter: Secondary | ICD-10-CM

## 2011-11-19 MED ORDER — CEPHALEXIN 500 MG PO CAPS: 500.0000 mg | ORAL_CAPSULE | Freq: Three times a day (TID) | ORAL | Status: AC

## 2011-11-19 NOTE — Progress Notes (Signed)
[facility] [address], [place] [address] [phone]  Date:  11/19/2011   Name:  Fernando Lane   DOB:  [birth year]   MRN:  [id number]  PCP:  Dema Severin, NP    Chief Complaint: Leg Swelling   History of Present Illness:  Fernando Lane is a 61 y.o. very pleasant male patient who presents with the following:  He has not been to see [place] in some time.   He has a chronic wound on his left knee- he went to wound care for over a year but it never did heal.  However, he fell repeatedly on the knee which may be why it never healed.  He now uses a walker so he does not fall as much.   He then fell outdoors about 5 months ago and cut his right leg on the lateral calf- it has failed to heal.  He used silvadene cream but it has not healed.    He visits the cancer center every 3 months for therapeutic phlebotomy due to polycythemia.  He also uses hydroxyurea for this problem.    Patient Active Problem List  Diagnosis  . HYPERLIPIDEMIA-MIXED  . MORBID OBESITY  . POLYCYTHEMIA  . OBSTRUCTIVE SLEEP APNEA  . HYPERTENSION  . CORONARY ARTERY DISEASE, VESSEL TYPE UNSPECIFIED  . PERIPHERAL VASCULAR DISEASE  . ERECTILE DYSFUNCTION, ORGANIC  . DEGENERATIVE DISC DISEASE  . LOW BACK PAIN  . ABRASION, KNEE, LEFT    Past Medical History  Diagnosis Date  . Chronic low back pain   . 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 Chronic neck pain   . Hypertension   . OSA (obstructive sleep apnea)     CPAP @ bedtime    Past Surgical History  Procedure Date  . Lower extrmity doppler     [place] 2008; no evidence for PAD  . US echocardiography 11/2008    Normal valves, mild LAE  . Left knee replacement 10/2006  . Cervical spine surgery 02/2008  . Joint replacement     History  Substance Use Topics  . Smoking status: Former Smoker -- 0 years    Types: Cigarettes, Pipe  . Smokeless tobacco: Former Neurosurgeon   Comment: Pt quit  Cigarettes 1970's- and cigars & chew 1990  . Alcohol Use: No    Family History  Problem Relation Age of Onset  . Heart attack Father     No Known Allergies  Medication list has been reviewed and updated.  Current Outpatient Prescriptions on File Prior to Visit  Medication Sig Dispense Refill  . aspirin 81 MG tablet Take 81 mg by mouth daily.        Marland Kitchen atorvastatin (LIPITOR) 20 MG tablet Take 1 tablet (20 mg total) by mouth daily.  30 tablet  1  . HYDROcodone-acetaminophen (LORTAB) 7.5-500 MG per tablet TAKE ONE TABLET BY MOUTH EVERY 8 HOURS AS NEEDED FOR PAIN  90 tablet  3  . hydroxyurea (HYDREA) 500 MG capsule Take 1 capsule (500 mg total) by mouth daily. Take 1 capsule by mouth every other day alternating with 2 capsule every other day. May take with food to minimize GI side effects.  100 capsule  2  . lisinopril-hydrochlorothiazide (PRINZIDE,ZESTORETIC) 20-12.5 MG per tablet TAKE ONE TABLET BY MOUTH EVERY DAY  90 tablet  3  . meclizine (ANTIVERT) 12.5 MG tablet Take 1 tablet (12.5 mg total) by mouth every 4 (four) hours as  needed.  30 tablet  1  . metoprolol succinate (TOPROL-XL) 25 MG 24 hr tablet TAKE ONE TABLET BY MOUTH EVERY DAY IN THE MORNING  30 tablet  5  . naproxen (NAPROSYN) 500 MG tablet Take 1 tablet (500 mg total) by mouth daily.  30 tablet  1  . nystatin (MYCOSTATIN) powder Apply to affected area 3 times daily  15 g  0  . traMADol (ULTRAM) 50 MG tablet TAKE ONE TABLET BY MOUTH EVERY 6 HOURS AS NEEDED FOR MODERATE PAIN  120 tablet  0  . mupirocin ointment (BACTROBAN) 2 % APPLY TO AFFECTED AREA(S) TWICE DAILY  22 g  1    Review of Systems:  As per HPI- otherwise negative.   Physical Examination: Filed Vitals:   11/19/11 1630  BP: 130/82  Pulse: 74  Temp: 98 F (36.7 C)  Resp: 16   Filed Vitals:   11/19/11 1630  Height: 4\' 9"  (1.448 m)  Weight: 245 lb (111.131 kg)   Body mass index is 53.02 kg/(m^2). Ideal Body Weight: Weight in (lb) to have BMI = 25: 115.3    GEN: WDWN, NAD, Non-toxic, A & O x 3 HEENT: Atraumatic, Normocephalic. Neck supple. No masses, No LAD. Ears and Nose: No external deformity. CV: RRR, No M/G/R. No JVD. No thrill. No extra heart sounds. PULM: CTA B, no wheezes, crackles, rhonchi. No retractions. No resp. distress. No accessory muscle use. ABD: S, NT, ND, +BS. No rebound. No HSM. EXTR: No c/c/e NEURO Normal gait.  PSYCH: Normally interactive. Conversant. Not depressed or anxious appearing.  Calm demeanor.  Left knee; anterior knee with chronic appearing wound, weeping.  Does not appear acutely infected Right leg: there is a weeping, ulcerated wound on the lateral lower leg, and a smaller wound on the anteriomedial part of the leg.    Right leg wound cleaned with peroxide, placed non- stick pad with silvadene over wound and dressed with tube gauze and coban.    Results for orders placed in visit on 11/19/11  POCT GLYCOSYLATED HEMOGLOBIN (HGB A1C)      Component Value Range   Hemoglobin A1C 5.2      Assessment and Plan: Results for orders placed in visit on 11/19/11  POCT GLYCOSYLATED HEMOGLOBIN (HGB A1C)      Component Value Range   Hemoglobin A1C 5.2      Fernando Lane has ulcerated wound on his right leg and over his left knee- likely related to poor circulation.  Appear to be mildly superinfected at this time.  Will add keflex TID, and he will follow- up by phone (or RTC if not better) on Monday   Abbe Amsterdam, MD

## 2011-11-23 ENCOUNTER — Other Ambulatory Visit: Payer: Self-pay | Admitting: Neurological Surgery

## 2011-11-23 ENCOUNTER — Telehealth: Payer: Self-pay | Admitting: Hematology and Oncology

## 2011-11-23 NOTE — Telephone Encounter (Signed)
appt for 9/24 moved to 9/27 w/JH per 9/6 pof. S/w pt he is aware.

## 2011-12-03 ENCOUNTER — Other Ambulatory Visit: Payer: Medicare Other | Admitting: Lab

## 2011-12-04 ENCOUNTER — Telehealth: Payer: Self-pay

## 2011-12-04 NOTE — Telephone Encounter (Signed)
Pt states that his leg is still burning and would like a call back from Dr.Copland regarding this issue. Best# [phone]

## 2011-12-05 NOTE — Telephone Encounter (Signed)
Has finished abx given - states his leg is still burning, slightly red. Since pt was seen on 9/6 I advised pt that he needed to return due to him still having the redness and burning.  Pt understood and will be returning with Copland.  He also wanted to know about getting medication refills (i.e. Cholesterol, bp med) I explained to him not to eat after midnight on the day he chooses to come in, if he comes in the morning. Pt will be coming in Monday 12/06/11 and see Copland.

## 2011-12-07 ENCOUNTER — Other Ambulatory Visit: Payer: Medicare Other | Admitting: Lab

## 2011-12-07 ENCOUNTER — Ambulatory Visit: Payer: Medicare Other | Admitting: Hematology and Oncology

## 2011-12-10 ENCOUNTER — Encounter: Payer: Self-pay | Admitting: Family Medicine

## 2011-12-10 ENCOUNTER — Ambulatory Visit (INDEPENDENT_AMBULATORY_CARE_PROVIDER_SITE_OTHER): Payer: Medicare Other | Admitting: Family Medicine

## 2011-12-10 ENCOUNTER — Ambulatory Visit: Payer: Medicare Other | Admitting: Family

## 2011-12-10 ENCOUNTER — Other Ambulatory Visit: Payer: Medicare Other | Admitting: Lab

## 2011-12-10 VITALS — BP 140/90 | HR 72 | Temp 97.9°F | Resp 18 | Ht 65.5 in | Wt 243.0 lb

## 2011-12-10 DIAGNOSIS — I10 Essential (primary) hypertension: Secondary | ICD-10-CM

## 2011-12-10 DIAGNOSIS — G894 Chronic pain syndrome: Secondary | ICD-10-CM

## 2011-12-10 DIAGNOSIS — Z760 Encounter for issue of repeat prescription: Secondary | ICD-10-CM

## 2011-12-10 DIAGNOSIS — Z5189 Encounter for other specified aftercare: Secondary | ICD-10-CM

## 2011-12-10 MED ORDER — HYDROCODONE-ACETAMINOPHEN 7.5-500 MG PO TABS: 1.0000 | ORAL_TABLET | Freq: Three times a day (TID) | ORAL | Status: DC | PRN

## 2011-12-10 MED ORDER — NYSTATIN 100000 UNIT/GM EX POWD: CUTANEOUS | Status: DC

## 2011-12-10 MED ORDER — LISINOPRIL-HYDROCHLOROTHIAZIDE 20-12.5 MG PO TABS: 1.0000 | ORAL_TABLET | Freq: Every day | ORAL | Status: DC

## 2011-12-10 MED ORDER — TRAMADOL HCL 50 MG PO TABS: 50.0000 mg | ORAL_TABLET | Freq: Four times a day (QID) | ORAL | Status: DC | PRN

## 2011-12-10 MED ORDER — MUPIROCIN 2 % EX OINT: TOPICAL_OINTMENT | Freq: Two times a day (BID) | CUTANEOUS | Status: DC

## 2011-12-10 MED ORDER — NAPROXEN 500 MG PO TABS: 500.0000 mg | ORAL_TABLET | Freq: Every day | ORAL | Status: DC

## 2011-12-10 MED ORDER — ATORVASTATIN CALCIUM 20 MG PO TABS: 20.0000 mg | ORAL_TABLET | Freq: Every day | ORAL | Status: DC

## 2011-12-10 NOTE — Patient Instructions (Signed)
Continue wound care as instructed at home; finish the antibiotic. You will return to our clinic in 10 days (sooner if wound looks worse). If you need another round of antibiotics, I will prescribe it at that time.  Re: chronic pain medication- as discussed, you will be referred to Pain Management for continued use of Narcotics. That is our clinic policy.

## 2011-12-14 ENCOUNTER — Telehealth: Payer: Self-pay

## 2011-12-14 ENCOUNTER — Other Ambulatory Visit: Payer: Medicare Other | Admitting: Lab

## 2011-12-14 ENCOUNTER — Ambulatory Visit: Payer: Medicare Other | Admitting: Family

## 2011-12-14 NOTE — Telephone Encounter (Signed)
PT NEEDS REFILLS ON THE FOLLOWING MEDICATIONS WAS SUPPOSED TO BE REFILLED Friday BUT WAS NOT CALLED IN PHARMACY ALSO HAS CALLED AND SENT OVER FAX REQUESTING THESE MEDS   NAPROXEN, MECLIZINE ALSO WOULD LIKE AMPICILLIN REFILLED AS LEG IS STILL INFLAMED OR HE HAS SOME LEFT OVER CEPHALEXIN AND HE CAN JUST TAKE THAT PLEASE ADVISE     [facility] [phone]    PT PH# [phone]

## 2011-12-15 ENCOUNTER — Other Ambulatory Visit: Payer: Self-pay | Admitting: Radiology

## 2011-12-15 ENCOUNTER — Other Ambulatory Visit: Payer: Self-pay | Admitting: Family Medicine

## 2011-12-15 ENCOUNTER — Telehealth: Payer: Self-pay | Admitting: Family Medicine

## 2011-12-15 ENCOUNTER — Encounter: Payer: Self-pay | Admitting: Family Medicine

## 2011-12-15 DIAGNOSIS — G4733 Obstructive sleep apnea (adult) (pediatric): Secondary | ICD-10-CM

## 2011-12-15 DIAGNOSIS — I10 Essential (primary) hypertension: Secondary | ICD-10-CM

## 2011-12-15 MED ORDER — MECLIZINE HCL 12.5 MG PO TABS: 12.5000 mg | ORAL_TABLET | ORAL | Status: DC | PRN

## 2011-12-15 MED ORDER — AMPICILLIN 500 MG PO CAPS: 500.0000 mg | ORAL_CAPSULE | Freq: Four times a day (QID) | ORAL | Status: DC

## 2011-12-15 MED ORDER — NAPROXEN 500 MG PO TABS: 500.0000 mg | ORAL_TABLET | Freq: Every day | ORAL | Status: DC

## 2011-12-15 MED ORDER — METOPROLOL SUCCINATE ER 25 MG PO TB24: 25.0000 mg | ORAL_TABLET | Freq: Every day | ORAL | Status: DC

## 2011-12-15 NOTE — Telephone Encounter (Signed)
I routed refills on requested meds to [facility].

## 2011-12-15 NOTE — Progress Notes (Signed)
S: This 61 y.o. Cauc male is new to me at [facility], having been seen at [facility] on 11/19/11 for treatment of leg wound (this is a chronic issue). Since that time, he sought care elsewhere and was prescribed Ampicillin which he is still taking.  He has PAD with chronic swelling and skin breakdown requiring antibiotics and follow-up with vascular specialist. He has DDD and is s/p C-spine surgery; he in taking Tramadol and Hydrocodone. He is requesting that responsibility for narcotic refills be assumed by this practice. He needs refills for HTN and statin medication (labs last checked in May 2013).  ROS: Negative for diaphoresis, fatigue, unexplained weight change, fever/chills, CP or tightness, cough or SOB, palpitations, HA, dizziness/lightheadedness, numbness, weakness or syncope. Activity  Is limited by chronic neck and back pain.  O:  Filed Vitals:   12/10/11 1127  BP: 140/90                                          Weight is down 2 lbs from 11/19/11  Pulse: 72  Temp: 97.9 F (36.6 C)  Resp: 18   GEN: In NAD; WN,WD (obese). HENT: [place]/AT; EOMI with clear conj/scl NECK: well healed anterior scar; No JVD or LAN. COR: RRR; pretibial edema. LUNGS: Normal resp rate and effort. SKIN: Lower ext erythema with superficial ulcerative lesion on medial calf; 3 x 4 cm ulcerative wound with scant purulent matter at edges (healthy granulation tissue in middle) NEURO: A&O x 3; CNs intact; otherwise grossly nonfocal.   A/P: 1. Unspecified essential hypertension  RF: Lisinopril-HCTZ and Metoprolol Advised elevation of legs for 30 minutes twice daily to help reduce swelling.  2. Encounter for wound care  Wound redressed; finish antibiotic  3. Encounter for medication refill  As above; also refilled Atorvastatin.  4. Chronic pain disorder  Continue Tramadol RF: Hydrocodone-APAP x 1; advised pt that he will be referred to Pain Management for long term treatment of chronic pain; he voiced understanding.

## 2011-12-16 ENCOUNTER — Other Ambulatory Visit: Payer: Self-pay | Admitting: Radiology

## 2011-12-21 ENCOUNTER — Ambulatory Visit: Payer: Medicare Other | Admitting: Family Medicine

## 2011-12-22 ENCOUNTER — Ambulatory Visit: Payer: Self-pay | Admitting: Family Medicine

## 2011-12-23 ENCOUNTER — Encounter (HOSPITAL_COMMUNITY): Payer: Self-pay | Admitting: Pharmacy Technician

## 2011-12-31 ENCOUNTER — Encounter (HOSPITAL_COMMUNITY): Payer: Medicare Other

## 2012-01-04 ENCOUNTER — Other Ambulatory Visit (HOSPITAL_COMMUNITY): Payer: Medicare Other

## 2012-01-05 ENCOUNTER — Telehealth: Payer: Self-pay | Admitting: *Deleted

## 2012-01-05 ENCOUNTER — Ambulatory Visit: Payer: Medicare Other | Admitting: Hematology and Oncology

## 2012-01-05 ENCOUNTER — Other Ambulatory Visit: Payer: Medicare Other | Admitting: Lab

## 2012-01-05 NOTE — Telephone Encounter (Signed)
Received message from pt wanting to reschedule f/u appt.   Pt  FTKA for appt today with md.   Spoke with pt , and was informed that pt was at urgent care this am for infection of right leg.   Pt wished to reschedule appt with md.   Informed pt that pt will be contacted with further instructions from md. Pt's  Phone     [phone].

## 2012-01-06 ENCOUNTER — Inpatient Hospital Stay (HOSPITAL_COMMUNITY): Admission: RE | Admit: 2012-01-06 | Payer: Medicare Other | Source: Ambulatory Visit | Admitting: Neurological Surgery

## 2012-01-06 ENCOUNTER — Other Ambulatory Visit: Payer: Self-pay | Admitting: *Deleted

## 2012-01-06 ENCOUNTER — Encounter (HOSPITAL_COMMUNITY): Admission: RE | Payer: Self-pay | Source: Ambulatory Visit

## 2012-01-06 SURGERY — POSTERIOR CERVICAL FUSION/FORAMINOTOMY LEVEL 3
Anesthesia: General

## 2012-01-07 ENCOUNTER — Telehealth: Payer: Self-pay | Admitting: *Deleted

## 2012-01-07 NOTE — Telephone Encounter (Signed)
Mailed out calendar to inform the patient of the new date and time on 01-18-2012 starting at 2:30pm  Called the patient's cell phone and the voicemail has not been set up yet was unable to leave a message

## 2012-01-10 ENCOUNTER — Telehealth: Payer: Self-pay | Admitting: Hematology and Oncology

## 2012-01-10 NOTE — Telephone Encounter (Signed)
Pt called today re new d/t for f/u. Returned pt's call and gv him new appt for 11/5.

## 2012-01-16 ENCOUNTER — Ambulatory Visit (INDEPENDENT_AMBULATORY_CARE_PROVIDER_SITE_OTHER): Payer: Medicare Other | Admitting: Family Medicine

## 2012-01-16 ENCOUNTER — Ambulatory Visit: Payer: Medicare Other

## 2012-01-16 VITALS — BP 124/78 | HR 67 | Temp 97.4°F | Resp 16 | Ht 66.0 in | Wt 245.6 lb

## 2012-01-16 DIAGNOSIS — G894 Chronic pain syndrome: Secondary | ICD-10-CM

## 2012-01-16 DIAGNOSIS — M545 Low back pain: Secondary | ICD-10-CM

## 2012-01-16 DIAGNOSIS — L97919 Non-pressure chronic ulcer of unspecified part of right lower leg with unspecified severity: Secondary | ICD-10-CM

## 2012-01-16 DIAGNOSIS — L97909 Non-pressure chronic ulcer of unspecified part of unspecified lower leg with unspecified severity: Secondary | ICD-10-CM

## 2012-01-16 MED ORDER — CYCLOBENZAPRINE HCL 5 MG PO TABS: 5.0000 mg | ORAL_TABLET | Freq: Three times a day (TID) | ORAL | Status: DC | PRN

## 2012-01-16 MED ORDER — LEVOFLOXACIN 500 MG PO TABS: 500.0000 mg | ORAL_TABLET | Freq: Every day | ORAL | Status: DC

## 2012-01-16 NOTE — Progress Notes (Signed)
[address]   [place], [place]  [address]   [phone]  Subjective:    Patient ID: Fernando Lane, male    DOB: 1950-04-14, 61 y.o.   MRN: [id number]  HPIThis 61 y.o. male presents for evaluation of the following:  1. Low back pain:  Onset of R lower back pain; occurred overnight; awoke this morning in pain.  Radiation all over; radiates into shoulder; radiates into R leg.  No n/t.  Last night did notice that R foot was swollen.  No weakness in legs; normal b/b function.  No saddle paresthesias.  S/p C-spine surgery 2009.  Scheduled to have cervical surgery before end of year.   Last low back xray at [facility] a few months ago.  Spine Specialist is Marikay Alar, MD with [facility].  Disability for DDD lumbar spine 2009.  Dr. Yetta Barre also follows lower back.  Will need lower back surgery after neck surgery.  Dr. Audria Nine referred pt to Pain Clinic but would like rx for pain medication.  2.  Red rash leg R: has suffered with leg redness for several months.  Cannot get rid of it.  Previous wound L knee; followed by [facility] Dr. Ned Clines for 15 months; wound on L still present.  Lives in [place]; started seeing physician in [place].  S/p evaluation by Dr. Orson Ape; prescribed Ampicillin; just completed Ampicillin.  Hot poker in it.  Wound improves; pain goes away but pain will recur.  Keeps covered normally; applying Bactroban on it daily.  Has Silvadene and applies it.  S/p evaluation by vascular surgery to evaluate for PAD; studies looked good.  S/p culture of wound by Dr. Patsy Lager; no staph in wound.  Worried about MRSA.  S/p culture by Dr. Elyn Peers in [place]; no MRSA.  Reluctant to undergo evaluation by Wound Center due to prolonged healing with L knee wound.      Review of Systems  Constitutional: Negative for fever, chills, diaphoresis and fatigue.  Respiratory: Negative for shortness of breath.   Cardiovascular: Positive for leg swelling. Negative for chest pain and palpitations.    Genitourinary: Negative for decreased urine volume.  Musculoskeletal: Positive for myalgias, back pain, arthralgias and gait problem.  Skin: Positive for color change and wound. Negative for pallor.  Neurological: Negative for weakness and numbness.        Past Medical History  Diagnosis Date  . Chronic low back pain   . 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 Chronic neck pain   . Hypertension   . OSA (obstructive sleep apnea)     CPAP @ bedtime  . Allergy     Past Surgical History  Procedure Date  . Lower extrmity doppler     [place] 2008; no evidence for PAD  . US echocardiography 11/2008    Normal valves, mild LAE  . Left knee replacement 10/2006  . Cervical spine surgery 02/2008  . Joint replacement   . Spine surgery     Prior to Admission medications   Medication Sig Start Date End Date Taking? Authorizing Provider  aspirin 81 MG tablet Take 81 mg by mouth daily.     Yes Historical Provider, MD  hydroxyurea (HYDREA) 500 MG capsule Take 1 capsule (500 mg total) by mouth daily. Take 1 capsule by mouth every other day alternating with 2 capsule every other day. May take with food to minimize GI side effects. 02/03/11  Yes Quita Skye, NP  lisinopril-hydrochlorothiazide (  PRINZIDE,ZESTORETIC) 20-12.5 MG per tablet Take 1 tablet by mouth daily. 12/10/11  Yes Maurice March, MD  ampicillin (PRINCIPEN) 500 MG capsule Take 1 capsule (500 mg total) by mouth 4 (four) times daily. 01/28/12   Newt Lukes, MD  cyclobenzaprine (FLEXERIL) 5 MG tablet Take 1 tablet (5 mg total) by mouth 3 (three) times daily as needed for muscle spasms. 01/16/12   Ethelda Chick, MD  HYDROcodone-acetaminophen (LORTAB) 7.5-500 MG per tablet Take 1 tablet by mouth every 8 (eight) hours as needed for pain. 04/17/12   Newt Lukes, MD  meclizine (ANTIVERT) 12.5 MG tablet Take 1 tablet (12.5 mg total) by mouth every 4 (four) hours as needed. 04/10/12    Newt Lukes, MD  metoprolol succinate (TOPROL-XL) 25 MG 24 hr tablet Take 1 tablet (25 mg total) by mouth daily. 04/10/12   Newt Lukes, MD  mupirocin ointment (BACTROBAN) 2 % APPLY TO AFFECTED AREA TOPICALLY TWICE ADAY 03/31/12   Newt Lukes, MD  naproxen (NAPROSYN) 500 MG tablet Take 500 mg by mouth 2 (two) times daily with a meal. Take with food. 12/15/11   Maurice March, MD  pravastatin (PRAVACHOL) 20 MG tablet Take 1 tablet (20 mg total) by mouth daily. 01/28/12   Newt Lukes, MD  traMADol (ULTRAM) 50 MG tablet TAKE ONE (1) TABLET BY MOUTH EVERY 6 HOURS AS NEEDED FOR PAIN 04/03/12   Newt Lukes, MD    No Known Allergies  History   Social History  . Marital Status: Single    Spouse Name: N/A    Number of Children: 1  . Years of Education: N/A   Occupational History  .     Social History Main Topics  . Smoking status: Former Smoker -- 0 years    Types: Cigarettes, Pipe  . Smokeless tobacco: Former Neurosurgeon     Comment: Pt quit Cigarettes 1970's- and cigars & chew 1990  . Alcohol Use: 3.6 oz/week    6 Cans of beer per week  . Drug Use: No  . Sexually Active: Not on file   Other Topics Concern  . Not on file   Social History Narrative   Divorced, lives alone. Daughter lives in town. Disable/Former management/construction    Family History  Problem Relation Age of Onset  . Heart attack Father     Objective:   Physical Exam  Nursing note and vitals reviewed. Constitutional: He is oriented to person, place, and time. He appears well-developed and well-nourished. No distress.  Cardiovascular: Normal rate and regular rhythm.   Pulmonary/Chest: Effort normal and breath sounds normal. He has no wheezes. He has no rales.  Musculoskeletal: He exhibits edema.       Lumbar back: He exhibits decreased range of motion, tenderness, pain and spasm. He exhibits no bony tenderness, no swelling and no edema.       LUMBAR SPINE: NO MIDLINE TTP;  +PARASPINAL TTP; STRAIGHT LEG RAISE NEGATIVE; MOTOR 5/5 BLE; TOE AND HEEL WALKING INTACT; MARCHING INTACT.  Neurological: He is alert and oriented to person, place, and time. No cranial nerve deficit. He exhibits normal muscle tone. Coordination normal.  Skin: He is not diaphoretic.       RLE:  1+ PITTING EDEMA R>L LEG; diffuse erythema RLE at ankle; +large 1 cm ulcerative lesion without active drainage, odor.  No fluctuants or streaking.    Psychiatric: He has a normal mood and affect. His behavior is normal. Judgment and thought content normal.  [facility] reading (PRIMARY) by  Dr. Katrinka Blazing.  LS SPINE: NAD; diffuse degenerative changes with spurring.    Assessment & Plan:   1. Ulcer of leg, chronic, right  Wound culture, AMB referral to wound care center, DISCONTINUED: levofloxacin (LEVAQUIN) 500 MG tablet  2. Low back pain  DG Lumbar Spine Complete, cyclobenzaprine (FLEXERIL) 5 MG tablet, DISCONTINUED: cyclobenzaprine (FLEXERIL) 5 MG tablet  3. Chronic pain disorder      1.  RLE chronic ulcer:  Persistent without change since last visit; refer to wound center.  Obtain wound culture.  Rx for Levaquin provided.  Keep clean and covered with daily activity; can remove bandage at night only. 2.  Low back pain: New.  With known DDD.  Rx for Flexeril 5mg  tid; recommend home exercise program. Avoid heavy lifting or repetitive bending, twisting, rotating for the next two weeks.  If no improvement,will need follow-up with NS.   Refused to provide with narcotic prescription; needs to obtain from NS or pain specialist as advised by Dr. Audria Nine.  Pt non-compliant with consultation with Pain Specialist. 3. Chronic pain disorder: Persistent; warrants management by pain specialist.   Meds ordered this encounter  Medications  . DISCONTD: levofloxacin (LEVAQUIN) 500 MG tablet    Sig: Take 1 tablet (500 mg total) by mouth daily.    Dispense:  10 tablet    Refill:  0  . DISCONTD: cyclobenzaprine (FLEXERIL) 5  MG tablet    Sig: Take 1 tablet (5 mg total) by mouth 3 (three) times daily as needed for muscle spasms.    Dispense:  30 tablet    Refill:  1  . cyclobenzaprine (FLEXERIL) 5 MG tablet    Sig: Take 1 tablet (5 mg total) by mouth 3 (three) times daily as needed for muscle spasms.    Dispense:  30 tablet    Refill:  1

## 2012-01-16 NOTE — Patient Instructions (Addendum)
1. Ulcer of leg, chronic, right  Wound culture, levofloxacin (LEVAQUIN) 500 MG tablet, AMB referral to wound care center  2. Low back pain  DG Lumbar Spine Complete, cyclobenzaprine (FLEXERIL) 5 MG tablet, DISCONTINUED: cyclobenzaprine (FLEXERIL) 5 MG tablet   Low Back Sprain with Rehab  A sprain is an injury in which a ligament is torn. The ligaments of the lower back are vulnerable to sprains. However, they are strong and require great force to be injured. These ligaments are important for stabilizing the spinal column. Sprains are classified into three categories. Grade 1 sprains cause pain, but the tendon is not lengthened. Grade 2 sprains include a lengthened ligament, due to the ligament being stretched or partially ruptured. With grade 2 sprains there is still function, although the function may be decreased. Grade 3 sprains involve a complete tear of the tendon or muscle, and function is usually impaired. SYMPTOMS   Severe pain in the lower back.  Sometimes, a feeling of a "pop," "snap," or tear, at the time of injury.  Tenderness and sometimes swelling at the injury site.  Uncommonly, bruising (contusion) within 48 hours of injury.  Muscle spasms in the back. CAUSES  Low back sprains occur when a force is placed on the ligaments that is greater than they can handle. Common causes of injury include:  Performing a stressful act while off-balance.  Repetitive stressful activities that involve movement of the lower back.  Direct hit (trauma) to the lower back. RISK INCREASES WITH:  Contact sports (football, wrestling).  Collisions (major skiing accidents).  Sports that require throwing or lifting (baseball, weightlifting).  Sports involving twisting of the spine (gymnastics, diving, tennis, golf).  Poor strength and flexibility.  Inadequate protection.  Previous back injury or surgery (especially fusion). PREVENTION  Wear properly fitted and padded protective  equipment.  Warm up and stretch properly before activity.  Allow for adequate recovery between workouts.  Maintain physical fitness:  Strength, flexibility, and endurance.  Cardiovascular fitness.  Maintain a healthy body weight. PROGNOSIS  If treated properly, low back sprains usually heal with non-surgical treatment. The length of time for healing depends on the severity of the injury.  RELATED COMPLICATIONS   Recurring symptoms, resulting in a chronic problem.  Chronic inflammation and pain in the low back.  Delayed healing or resolution of symptoms, especially if activity is resumed too soon.  Prolonged impairment.  Unstable or arthritic joints of the low back. TREATMENT  Treatment first involves the use of ice and medicine, to reduce pain and inflammation. The use of strengthening and stretching exercises may help reduce pain with activity. These exercises may be performed at home or with a therapist. Severe injuries may require referral to a therapist for further evaluation and treatment, such as ultrasound. Your caregiver may advise that you wear a back brace or corset, to help reduce pain and discomfort. Often, prolonged bed rest results in greater harm then benefit. Corticosteroid injections may be recommended. However, these should be reserved for the most serious cases. It is important to avoid using your back when lifting objects. At night, sleep on your back on a firm mattress, with a pillow placed under your knees. If non-surgical treatment is unsuccessful, surgery may be needed.  MEDICATION   If pain medicine is needed, nonsteroidal anti-inflammatory medicines (aspirin and ibuprofen), or other minor pain relievers (acetaminophen), are often advised.  Do not take pain medicine for 7 days before surgery.  Prescription pain relievers may be given, if your caregiver  thinks they are needed. Use only as directed and only as much as you need.  Ointments applied to the skin  may be helpful.  Corticosteroid injections may be given by your caregiver. These injections should be reserved for the most serious cases, because they may only be given a certain number of times. HEAT AND COLD  Cold treatment (icing) should be applied for 10 to 15 minutes every 2 to 3 hours for inflammation and pain, and immediately after activity that aggravates your symptoms. Use ice packs or an ice massage.  Heat treatment may be used before performing stretching and strengthening activities prescribed by your caregiver, physical therapist, or athletic trainer. Use a heat pack or a warm water soak. SEEK MEDICAL CARE IF:   Symptoms get worse or do not improve in 2 to 4 weeks, despite treatment.  You develop numbness or weakness in either leg.  You lose bowel or bladder function.  Any of the following occur after surgery: fever, increased pain, swelling, redness, drainage of fluids, or bleeding in the affected area.  New, unexplained symptoms develop. (Drugs used in treatment may produce side effects.) EXERCISES  RANGE OF MOTION (ROM) AND STRETCHING EXERCISES - Low Back Sprain Most people with lower back pain will find that their symptoms get worse with excessive bending forward (flexion) or arching at the lower back (extension). The exercises that will help resolve your symptoms will focus on the opposite motion.  Your physician, physical therapist or athletic trainer will help you determine which exercises will be most helpful to resolve your lower back pain. Do not complete any exercises without first consulting with your caregiver. Discontinue any exercises which make your symptoms worse, until you speak to your caregiver. If you have pain, numbness or tingling which travels down into your buttocks, leg or foot, the goal of the therapy is for these symptoms to move closer to your back and eventually resolve. Sometimes, these leg symptoms will get better, but your lower back pain may  worsen. This is often an indication of progress in your rehabilitation. Be very alert to any changes in your symptoms and the activities in which you participated in the 24 hours prior to the change. Sharing this information with your caregiver will allow him or her to most efficiently treat your condition. These exercises may help you when beginning to rehabilitate your injury. Your symptoms may resolve with or without further involvement from your physician, physical therapist or athletic trainer. While completing these exercises, remember:   Restoring tissue flexibility helps normal motion to return to the joints. This allows healthier, less painful movement and activity.  An effective stretch should be held for at least 30 seconds.  A stretch should never be painful. You should only feel a gentle lengthening or release in the stretched tissue. FLEXION RANGE OF MOTION AND STRETCHING EXERCISES: STRETCH  Flexion, Single Knee to Chest   Lie on a firm bed or floor with both legs extended in front of you.  Keeping one leg in contact with the floor, bring your opposite knee to your chest. Hold your leg in place by either grabbing behind your thigh or at your knee.  Pull until you feel a gentle stretch in your low back. Hold __________ seconds.  Slowly release your grasp and repeat the exercise with the opposite side. Repeat __________ times. Complete this exercise __________ times per day.  STRETCH  Flexion, Double Knee to Chest  Lie on a firm bed or floor with both  legs extended in front of you.  Keeping one leg in contact with the floor, bring your opposite knee to your chest.  Tense your stomach muscles to support your back and then lift your other knee to your chest. Hold your legs in place by either grabbing behind your thighs or at your knees.  Pull both knees toward your chest until you feel a gentle stretch in your low back. Hold __________ seconds.  Tense your stomach muscles and  slowly return one leg at a time to the floor. Repeat __________ times. Complete this exercise __________ times per day.  STRETCH  Low Trunk Rotation  Lie on a firm bed or floor. Keeping your legs in front of you, bend your knees so they are both pointed toward the ceiling and your feet are flat on the floor.  Extend your arms out to the side. This will stabilize your upper body by keeping your shoulders in contact with the floor.  Gently and slowly drop both knees together to one side until you feel a gentle stretch in your low back. Hold for __________ seconds.  Tense your stomach muscles to support your lower back as you bring your knees back to the starting position. Repeat the exercise to the other side. Repeat __________ times. Complete this exercise __________ times per day  EXTENSION RANGE OF MOTION AND FLEXIBILITY EXERCISES: STRETCH  Extension, Prone on Elbows   Lie on your stomach on the floor, a bed will be too soft. Place your palms about shoulder width apart and at the height of your head.  Place your elbows under your shoulders. If this is too painful, stack pillows under your chest.  Allow your body to relax so that your hips drop lower and make contact more completely with the floor.  Hold this position for __________ seconds.  Slowly return to lying flat on the floor. Repeat __________ times. Complete this exercise __________ times per day.  RANGE OF MOTION  Extension, Prone Press Ups  Lie on your stomach on the floor, a bed will be too soft. Place your palms about shoulder width apart and at the height of your head.  Keeping your back as relaxed as possible, slowly straighten your elbows while keeping your hips on the floor. You may adjust the placement of your hands to maximize your comfort. As you gain motion, your hands will come more underneath your shoulders.  Hold this position __________ seconds.  Slowly return to lying flat on the floor. Repeat __________  times. Complete this exercise __________ times per day.  RANGE OF MOTION- Quadruped, Neutral Spine   Assume a hands and knees position on a firm surface. Keep your hands under your shoulders and your knees under your hips. You may place padding under your knees for comfort.  Drop your head and point your tailbone toward the ground below you. This will round out your lower back like an angry cat. Hold this position for __________ seconds.  Slowly lift your head and release your tail bone so that your back sags into a large arch, like an old horse.  Hold this position for __________ seconds.  Repeat this until you feel limber in your low back.  Now, find your "sweet spot." This will be the most comfortable position somewhere between the two previous positions. This is your neutral spine. Once you have found this position, tense your stomach muscles to support your low back.  Hold this position for __________ seconds. Repeat __________ times. Complete  this exercise __________ times per day.  STRENGTHENING EXERCISES - Low Back Sprain These exercises may help you when beginning to rehabilitate your injury. These exercises should be done near your "sweet spot." This is the neutral, low-back arch, somewhere between fully rounded and fully arched, that is your least painful position. When performed in this safe range of motion, these exercises can be used for people who have either a flexion or extension based injury. These exercises may resolve your symptoms with or without further involvement from your physician, physical therapist or athletic trainer. While completing these exercises, remember:   Muscles can gain both the endurance and the strength needed for everyday activities through controlled exercises.  Complete these exercises as instructed by your physician, physical therapist or athletic trainer. Increase the resistance and repetitions only as guided.  You may experience muscle soreness  or fatigue, but the pain or discomfort you are trying to eliminate should never worsen during these exercises. If this pain does worsen, stop and make certain you are following the directions exactly. If the pain is still present after adjustments, discontinue the exercise until you can discuss the trouble with your caregiver. STRENGTHENING Deep Abdominals, Pelvic Tilt   Lie on a firm bed or floor. Keeping your legs in front of you, bend your knees so they are both pointed toward the ceiling and your feet are flat on the floor.  Tense your lower abdominal muscles to press your low back into the floor. This motion will rotate your pelvis so that your tail bone is scooping upwards rather than pointing at your feet or into the floor. With a gentle tension and even breathing, hold this position for __________ seconds. Repeat __________ times. Complete this exercise __________ times per day.  STRENGTHENING  Abdominals, Crunches   Lie on a firm bed or floor. Keeping your legs in front of you, bend your knees so they are both pointed toward the ceiling and your feet are flat on the floor. Cross your arms over your chest.  Slightly tip your chin down without bending your neck.  Tense your abdominals and slowly lift your trunk high enough to just clear your shoulder blades. Lifting higher can put excessive stress on the lower back and does not further strengthen your abdominal muscles.  Control your return to the starting position. Repeat __________ times. Complete this exercise __________ times per day.  STRENGTHENING  Quadruped, Opposite UE/LE Lift   Assume a hands and knees position on a firm surface. Keep your hands under your shoulders and your knees under your hips. You may place padding under your knees for comfort.  Find your neutral spine and gently tense your abdominal muscles so that you can maintain this position. Your shoulders and hips should form a rectangle that is parallel with the  floor and is not twisted.  Keeping your trunk steady, lift your right hand no higher than your shoulder and then your left leg no higher than your hip. Make sure you are not holding your breath. Hold this position for __________ seconds.  Continuing to keep your abdominal muscles tense and your back steady, slowly return to your starting position. Repeat with the opposite arm and leg. Repeat __________ times. Complete this exercise __________ times per day.  STRENGTHENING  Abdominals and Quadriceps, Straight Leg Raise   Lie on a firm bed or floor with both legs extended in front of you.  Keeping one leg in contact with the floor, bend the other knee so  that your foot can rest flat on the floor.  Find your neutral spine, and tense your abdominal muscles to maintain your spinal position throughout the exercise.  Slowly lift your straight leg off the floor about 6 inches for a count of 15, making sure to not hold your breath.  Still keeping your neutral spine, slowly lower your leg all the way to the floor. Repeat this exercise with each leg __________ times. Complete this exercise __________ times per day. POSTURE AND BODY MECHANICS CONSIDERATIONS - Low Back Sprain Keeping correct posture when sitting, standing or completing your activities will reduce the stress put on different body tissues, allowing injured tissues a chance to heal and limiting painful experiences. The following are general guidelines for improved posture. Your physician or physical therapist will provide you with any instructions specific to your needs. While reading these guidelines, remember:  The exercises prescribed by your provider will help you have the flexibility and strength to maintain correct postures.  The correct posture provides the best environment for your joints to work. All of your joints have less wear and tear when properly supported by a spine with good posture. This means you will experience a  healthier, less painful body.  Correct posture must be practiced with all of your activities, especially prolonged sitting and standing. Correct posture is as important when doing repetitive low-stress activities (typing) as it is when doing a single heavy-load activity (lifting). RESTING POSITIONS Consider which positions are most painful for you when choosing a resting position. If you have pain with flexion-based activities (sitting, bending, stooping, squatting), choose a position that allows you to rest in a less flexed posture. You would want to avoid curling into a fetal position on your side. If your pain worsens with extension-based activities (prolonged standing, working overhead), avoid resting in an extended position such as sleeping on your stomach. Most people will find more comfort when they rest with their spine in a more neutral position, neither too rounded nor too arched. Lying on a non-sagging bed on your side with a pillow between your knees, or on your back with a pillow under your knees will often provide some relief. Keep in mind, being in any one position for a prolonged period of time, no matter how correct your posture, can still lead to stiffness. PROPER SITTING POSTURE In order to minimize stress and discomfort on your spine, you must sit with correct posture. Sitting with good posture should be effortless for a healthy body. Returning to good posture is a gradual process. Many people can work toward this most comfortably by using various supports until they have the flexibility and strength to maintain this posture on their own. When sitting with proper posture, your ears will fall over your shoulders and your shoulders will fall over your hips. You should use the back of the chair to support your upper back. Your lower back will be in a neutral position, just slightly arched. You may place a small pillow or folded towel at the base of your lower back for  support.  When  working at a desk, create an environment that supports good, upright posture. Without extra support, muscles tire, which leads to excessive strain on joints and other tissues. Keep these recommendations in mind: CHAIR:  A chair should be able to slide under your desk when your back makes contact with the back of the chair. This allows you to work closely.  The chair's height should allow your eyes to  be level with the upper part of your monitor and your hands to be slightly lower than your elbows. BODY POSITION  Your feet should make contact with the floor. If this is not possible, use a foot rest.  Keep your ears over your shoulders. This will reduce stress on your neck and low back. INCORRECT SITTING POSTURES  If you are feeling tired and unable to assume a healthy sitting posture, do not slouch or slump. This puts excessive strain on your back tissues, causing more damage and pain. Healthier options include:  Using more support, like a lumbar pillow.  Switching tasks to something that requires you to be upright or walking.  Talking a brief walk.  Lying down to rest in a neutral-spine position. PROLONGED STANDING WHILE SLIGHTLY LEANING FORWARD  When completing a task that requires you to lean forward while standing in one place for a long time, place either foot up on a stationary 2-4 inch high object to help maintain the best posture. When both feet are on the ground, the lower back tends to lose its slight inward curve. If this curve flattens (or becomes too large), then the back and your other joints will experience too much stress, tire more quickly, and can cause pain. CORRECT STANDING POSTURES Proper standing posture should be assumed with all daily activities, even if they only take a few moments, like when brushing your teeth. As in sitting, your ears should fall over your shoulders and your shoulders should fall over your hips. You should keep a slight tension in your abdominal  muscles to brace your spine. Your tailbone should point down to the ground, not behind your body, resulting in an over-extended swayback posture.  INCORRECT STANDING POSTURES  Common incorrect standing postures include a forward head, locked knees and/or an excessive swayback. WALKING Walk with an upright posture. Your ears, shoulders and hips should all line-up. PROLONGED ACTIVITY IN A FLEXED POSITION When completing a task that requires you to bend forward at your waist or lean over a low surface, try to find a way to stabilize 3 out of 4 of your limbs. You can place a hand or elbow on your thigh or rest a knee on the surface you are reaching across. This will provide you more stability, so that your muscles do not tire as quickly. By keeping your knees relaxed, or slightly bent, you will also reduce stress across your lower back. CORRECT LIFTING TECHNIQUES DO :  Assume a wide stance. This will provide you more stability and the opportunity to get as close as possible to the object which you are lifting.  Tense your abdominals to brace your spine. Bend at the knees and hips. Keeping your back locked in a neutral-spine position, lift using your leg muscles. Lift with your legs, keeping your back straight.  Test the weight of unknown objects before attempting to lift them.  Try to keep your elbows locked down at your sides in order get the best strength from your shoulders when carrying an object.  Always ask for help when lifting heavy or awkward objects. INCORRECT LIFTING TECHNIQUES DO NOT:   Lock your knees when lifting, even if it is a small object.  Bend and twist. Pivot at your feet or move your feet when needing to change directions.  Assume that you can safely pick up even a paperclip without proper posture. Document Released: 03/01/2005 Document Revised: 05/24/2011 Document Reviewed: 06/13/2008 [facility] Patient Information 2013 [place], [place].

## 2012-01-18 ENCOUNTER — Telehealth: Payer: Self-pay | Admitting: Hematology and Oncology

## 2012-01-18 ENCOUNTER — Ambulatory Visit (HOSPITAL_BASED_OUTPATIENT_CLINIC_OR_DEPARTMENT_OTHER): Payer: Medicare Other | Admitting: Hematology and Oncology

## 2012-01-18 ENCOUNTER — Other Ambulatory Visit (HOSPITAL_BASED_OUTPATIENT_CLINIC_OR_DEPARTMENT_OTHER): Payer: Medicare Other

## 2012-01-18 ENCOUNTER — Ambulatory Visit (HOSPITAL_BASED_OUTPATIENT_CLINIC_OR_DEPARTMENT_OTHER): Payer: Medicare Other

## 2012-01-18 ENCOUNTER — Encounter: Payer: Self-pay | Admitting: Hematology and Oncology

## 2012-01-18 VITALS — BP 125/66 | HR 81 | Temp 97.5°F | Resp 20 | Ht 66.0 in | Wt 239.5 lb

## 2012-01-18 DIAGNOSIS — D45 Polycythemia vera: Secondary | ICD-10-CM

## 2012-01-18 DIAGNOSIS — D751 Secondary polycythemia: Secondary | ICD-10-CM

## 2012-01-18 LAB — CBC WITH DIFFERENTIAL/PLATELET
Eosinophils Absolute: 0.2 10*3/uL (ref 0.0–0.5)
HCT: 51.8 % — ABNORMAL HIGH (ref 38.4–49.9)
LYMPH%: 12.9 % — ABNORMAL LOW (ref 14.0–49.0)
MCHC: 33.8 g/dL (ref 32.0–36.0)
MCV: 104.9 fL — ABNORMAL HIGH (ref 79.3–98.0)
MONO%: 3.4 % (ref 0.0–14.0)
NEUT#: 6.2 10*3/uL (ref 1.5–6.5)
NEUT%: 81.1 % — ABNORMAL HIGH (ref 39.0–75.0)
Platelets: 413 10*3/uL — ABNORMAL HIGH (ref 140–400)
RBC: 4.94 10*6/uL (ref 4.20–5.82)
nRBC: 0 % (ref 0–0)

## 2012-01-18 LAB — BASIC METABOLIC PANEL (CC13)
BUN: 20 mg/dL (ref 7.0–26.0)
CO2: 25 mEq/L (ref 22–29)
Calcium: 9.5 mg/dL (ref 8.4–10.4)
Creatinine: 0.8 mg/dL (ref 0.7–1.3)

## 2012-01-18 NOTE — Progress Notes (Signed)
Therapeutic phlebotomy performed over 15 minutes. 575 mls obtained (90 mls in bag prior to tx). Pt tolerated well. Ate snacks after.

## 2012-01-18 NOTE — Patient Instructions (Signed)
Evelina Dun  [id number]   [facility] - AFTER VISIT SUMMARY   **RECOMMENDATIONS MADE BY THE CONSULTANT AND ANY TEST    RESULTS WILL BE SENT TO YOUR REFERRING DOCTORS.   YOUR EXAM FINDINGS, LABS AND RESULTS WERE DISCUSSED BY YOUR MD TODAY.  YOU CAN GO TO THE [facility] WEB SITE FOR INSTRUCTIONS ON HOW TO ASSESS MY CHART FOR ADDITIONAL INFORMATION AS NEEDED.  Your Updated drug allergies are: Allergies as of 01/18/2012  . (No Known Allergies)    Your current list of medications are: Current Outpatient Prescriptions  Medication Sig Dispense Refill  . aspirin 81 MG tablet Take 81 mg by mouth daily.        Marland Kitchen atorvastatin (LIPITOR) 20 MG tablet Take 1 tablet (20 mg total) by mouth daily.  30 tablet  3  . cyclobenzaprine (FLEXERIL) 5 MG tablet Take 1 tablet (5 mg total) by mouth 3 (three) times daily as needed for muscle spasms.  30 tablet  1  . HYDROcodone-acetaminophen (LORTAB) 7.5-500 MG per tablet Take 1 tablet by mouth every 8 (eight) hours as needed for pain.  90 tablet  0  . hydroxyurea (HYDREA) 500 MG capsule Take 1 capsule (500 mg total) by mouth daily. Take 1 capsule by mouth every other day alternating with 2 capsule every other day. May take with food to minimize GI side effects.  100 capsule  2  . levofloxacin (LEVAQUIN) 500 MG tablet Take 1 tablet (500 mg total) by mouth daily.  10 tablet  0  . lisinopril-hydrochlorothiazide (PRINZIDE,ZESTORETIC) 20-12.5 MG per tablet Take 1 tablet by mouth daily.  90 tablet  1  . meclizine (ANTIVERT) 12.5 MG tablet Take 1 tablet (12.5 mg total) by mouth every 4 (four) hours as needed.  40 tablet  1  . metoprolol succinate (TOPROL-XL) 25 MG 24 hr tablet Take 1 tablet (25 mg total) by mouth daily.  30 tablet  2  . mupirocin ointment (BACTROBAN) 2 % Apply topically 2 (two) times daily.  22 g  1  . naproxen (NAPROSYN) 500 MG tablet Take 1 tablet (500 mg total) by mouth daily. Take with food.  60 tablet  2  . traMADol (ULTRAM) 50 MG  tablet Take 1 tablet (50 mg total) by mouth every 6 (six) hours as needed for pain.  120 tablet  0     INSTRUCTIONS GIVEN AND DISCUSSED:  See attached schedule   SPECIAL INSTRUCTIONS/FOLLOW-UP:  See above.  I acknowledge that I have been informed and understand all the instructions given to me and received a copy.I know to contact the clinic, my physician, or go to the emergency Department if any problems should occur.   I do not have any more questions at this time, but understand that I may call the [facility] at [phone] during business hours should I have any further questions or need assistance in obtaining follow-up care.

## 2012-01-18 NOTE — Telephone Encounter (Signed)
gv and printed pt appt schedule for Dec and August.

## 2012-01-18 NOTE — Patient Instructions (Signed)

## 2012-01-18 NOTE — Progress Notes (Signed)
This office note has been dictated.

## 2012-01-19 NOTE — Progress Notes (Signed)
CC:   Valerie A. 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  INTERVAL HISTORY:  The patient was seen 9 months ago.  In that time he has had a skin infection.  He has been to the Wound Center.  He is also on antibiotics.  He is afebrile.  He denies chills.  He is tolerating Hydrea.  Denies mucositis.  Denies GI complaints specifically diarrhea.  MEDICATIONS:  Hydrea 500 mg alternating with 1000 mg.  PHYSICAL EXAM:  General:  The patient is alert and oriented x3.  Vitals: Pulse 81, blood pressure 125/66, temperature 97.5, respirations 20, weight 239.5 pounds.  HEENT:  Head is atraumatic, normocephalic. Sclerae anicteric.  Mouth moist.  Neck:  Supple.  Chest:  Clear. Abdomen:  Soft.  Extremities:  Plus/plus edema with erythema involving the right lower leg.  LAB DATA:  01/18/2012:  White cell count 7.7, hemoglobin 17.5, hematocrit 52, platelets 413.  BMET pending.  IMPRESSION AND PLAN:  Mr. Mangiaracina is a 61 year old man with myeloproliferative disorder with polycythemia vera, and essential thrombocytosis with JAK2  mutation positivity.  He is currently on Hydrea 500 mg alternating with 1000 mg p.o. daily.  He also takes a baby aspirin.  His last phlebotomy was in March 2012.  His hemoglobin and hematocrit are slightly higher than usual, so I will have him proceed with a phlebotomy after the clinic visit.  I have asked that he continue with the current dose of Hydrea.  He will be scheduled for labs in a month's time.  He has a clinic visit in 9 months' time.  We will be telephoning him with results of his upcoming CBC.    ______________________________ Laurice Record, M.D. LIO/MEDQ  D:  01/18/2012  T:  01/19/2012  Job:  [id number]

## 2012-01-20 LAB — WOUND CULTURE
Gram Stain: NONE SEEN
Gram Stain: NONE SEEN

## 2012-01-24 ENCOUNTER — Telehealth: Payer: Self-pay

## 2012-01-24 NOTE — Telephone Encounter (Signed)
Called pt, voice mail box not set up yet. Unable to leave mssg. Need to know the nature of the call, to see if I can help or get mssg to Dr Katrinka Blazing.
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
## 2012-01-25 ENCOUNTER — Telehealth: Payer: Self-pay

## 2012-01-25 NOTE — Telephone Encounter (Signed)
Please see MRN [id number]...this account is a duplicate.

## 2012-01-25 NOTE — Telephone Encounter (Signed)
Patient wanted to let Dr. Katrinka Blazing know that he is still having burning sensation in his legs. Please  Call [phone]

## 2012-01-25 NOTE — Telephone Encounter (Signed)
Still unable to leave message/ VM not set up
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
## 2012-01-25 NOTE — Telephone Encounter (Signed)
Duplicate chart, how do we deal with this issue?

## 2012-01-25 NOTE — Telephone Encounter (Signed)
Dr. Katrinka Blazing - looks like he has been seen at the wound care center, do you think he should go back? Or would you like to refill his antibiotic?
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
## 2012-01-26 NOTE — Telephone Encounter (Signed)
Patient called back to advise wound care center has called back and offered another appt.

## 2012-01-26 NOTE — Telephone Encounter (Signed)
Thanks, called patient about wound center. Left message for him to call me back.

## 2012-01-26 NOTE — Telephone Encounter (Signed)
Patient has appt in Dec. I will call wound center and see if they can move the appt up sooner.

## 2012-01-28 ENCOUNTER — Ambulatory Visit (INDEPENDENT_AMBULATORY_CARE_PROVIDER_SITE_OTHER): Payer: Medicare Other | Admitting: Internal Medicine

## 2012-01-28 ENCOUNTER — Encounter: Payer: Self-pay | Admitting: Internal Medicine

## 2012-01-28 VITALS — BP 110/60 | HR 64 | Temp 97.6°F | Resp 12 | Ht 65.5 in | Wt 238.0 lb

## 2012-01-28 DIAGNOSIS — L02419 Cutaneous abscess of limb, unspecified: Secondary | ICD-10-CM

## 2012-01-28 DIAGNOSIS — M545 Low back pain: Secondary | ICD-10-CM

## 2012-01-28 DIAGNOSIS — S81801A Unspecified open wound, right lower leg, initial encounter: Secondary | ICD-10-CM

## 2012-01-28 DIAGNOSIS — S81009A Unspecified open wound, unspecified knee, initial encounter: Secondary | ICD-10-CM

## 2012-01-28 DIAGNOSIS — E785 Hyperlipidemia, unspecified: Secondary | ICD-10-CM

## 2012-01-28 MED ORDER — HYDROCODONE-ACETAMINOPHEN 7.5-500 MG PO TABS: 1.0000 | ORAL_TABLET | Freq: Three times a day (TID) | ORAL | Status: DC | PRN

## 2012-01-28 MED ORDER — AMPICILLIN 500 MG PO CAPS: 500.0000 mg | ORAL_CAPSULE | Freq: Four times a day (QID) | ORAL | Status: DC

## 2012-01-28 MED ORDER — PRAVASTATIN SODIUM 20 MG PO TABS: 20.0000 mg | ORAL_TABLET | Freq: Every day | ORAL | Status: DC

## 2012-01-28 MED ORDER — MUPIROCIN 2 % EX OINT: TOPICAL_OINTMENT | Freq: Two times a day (BID) | CUTANEOUS | Status: DC

## 2012-01-28 MED ORDER — TRAMADOL HCL 50 MG PO TABS: 50.0000 mg | ORAL_TABLET | Freq: Four times a day (QID) | ORAL | Status: DC | PRN

## 2012-01-28 NOTE — Assessment & Plan Note (Signed)
Last lipids reviewed, check annually - acceptable per cards review 01/2010 Pt requests change in statin - change atorva to prava Encouraged to resume work on diet/lifestyle control of same Lab Results  Component Value Date   LDLCALC 64 08/02/2011
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
## 2012-01-28 NOTE — Assessment & Plan Note (Signed)
Chronic narcotic use to control same symptoms  controlled subst registry reviewed - cont same - refills done

## 2012-01-28 NOTE — Patient Instructions (Signed)
It was good to see you today. We have reviewed your prior records including labs and tests today Resume ampicillin antibiotics - continue until your wound care appointment in December we'll make referral to home health nurse for wound care . Our office will contact you regarding appointment(s) once made. Change Lipitor to Pravastatin for cholesterol Refill on pain meds Your prescription(s) have been submitted to your pharmacy. Please take as directed and contact our office if you believe you are having problem(s) with the medication(s). Please schedule followup in 3-4 months, call sooner if problems.

## 2012-02-02 NOTE — Telephone Encounter (Signed)
Call to have merged.

## 2012-02-07 ENCOUNTER — Telehealth: Payer: Self-pay | Admitting: *Deleted

## 2012-02-07 NOTE — Telephone Encounter (Signed)
Wanting to let md know been trying to get in contact with patient for a week now. He left msg today on her vm stating he think his leg is ok & his daughter was taking care of it, and he was going back to work. Will be discharging pt...Raechel Chute

## 2012-02-07 NOTE — Telephone Encounter (Signed)
Noted - thanks for update - apparently no need for [facility]

## 2012-02-09 ENCOUNTER — Other Ambulatory Visit: Payer: Self-pay | Admitting: Family Medicine

## 2012-02-15 ENCOUNTER — Encounter (HOSPITAL_BASED_OUTPATIENT_CLINIC_OR_DEPARTMENT_OTHER): Payer: Medicare Other | Attending: General Surgery

## 2012-02-15 ENCOUNTER — Other Ambulatory Visit (HOSPITAL_BASED_OUTPATIENT_CLINIC_OR_DEPARTMENT_OTHER): Payer: Medicare Other | Admitting: Lab

## 2012-02-15 DIAGNOSIS — D751 Secondary polycythemia: Secondary | ICD-10-CM

## 2012-02-15 DIAGNOSIS — L02419 Cutaneous abscess of limb, unspecified: Secondary | ICD-10-CM | POA: Insufficient documentation

## 2012-02-15 DIAGNOSIS — D45 Polycythemia vera: Secondary | ICD-10-CM

## 2012-02-15 DIAGNOSIS — Z79899 Other long term (current) drug therapy: Secondary | ICD-10-CM | POA: Insufficient documentation

## 2012-02-15 DIAGNOSIS — L03119 Cellulitis of unspecified part of limb: Secondary | ICD-10-CM | POA: Insufficient documentation

## 2012-02-15 DIAGNOSIS — L97909 Non-pressure chronic ulcer of unspecified part of unspecified lower leg with unspecified severity: Secondary | ICD-10-CM | POA: Insufficient documentation

## 2012-02-15 DIAGNOSIS — I872 Venous insufficiency (chronic) (peripheral): Secondary | ICD-10-CM | POA: Insufficient documentation

## 2012-02-15 DIAGNOSIS — E669 Obesity, unspecified: Secondary | ICD-10-CM | POA: Insufficient documentation

## 2012-02-15 LAB — CBC WITH DIFFERENTIAL/PLATELET
BASO%: 0.3 % (ref 0.0–2.0)
EOS%: 2.7 % (ref 0.0–7.0)
HCT: 49.7 % (ref 38.4–49.9)
MCH: 35.3 pg — ABNORMAL HIGH (ref 27.2–33.4)
MCHC: 33.7 g/dL (ref 32.0–36.0)
MONO#: 0.2 10*3/uL (ref 0.1–0.9)
RBC: 4.75 10*6/uL (ref 4.20–5.82)
RDW: 14.6 % (ref 11.0–14.6)
WBC: 9.1 10*3/uL (ref 4.0–10.3)
lymph#: 0.9 10*3/uL (ref 0.9–3.3)

## 2012-02-16 NOTE — Progress Notes (Cosign Needed)
[facility]  NAME:  Fernando Lane, Fernando Lane NO.:  [id number]  MEDICAL RECORD NO.:  [id number]      DATE OF BIRTH:  02-Jul-1950  PHYSICIAN:  Ardath Sax, M.D.           VISIT DATE:                                  OFFICE VISIT   This is a 61 year old gentleman who is not a stranger to our Wound Clinic.  He is come here before for treatment of leg ulcers that he has in largely due to trauma in the past and chronic venous stasis, plus he has polycythemia rubra vera and is on hydroxyurea as a chemotherapeutic agent.  On his right lateral and right medial leg, he has ulcers which look all to be like regular venous stasis ulcers, but I am sure they are influenced by the fact he is on hydroxyurea.  He also has a same ulcer over his left patella that he has had and has been treated here many times for in the past.  His blood pressure was 130/80, pulse 80, respirations 18, temperature 98.6.  He is only 5 feet and 4 inches and weighs 235 pounds.  He really other than these wounds and his obesity, his abdomen is soft and his lungs are okay and he does complain of back pain and leg pain, and this was due to spinal stenosis he has had and he has had surgery on his back also.  I debrided these wounds today and we are treating him with Santyl and Unna boots and we will have him come back in a week, hopefully we can start putting on something like Oasis in the future.  His diagnosis is chronic venous stasis with traumatic ulcerations of both of his legs and we are going to treat him with Santyl for now.  Other diagnoses are spinal stenosis and polycythemia rubra vera.     Ardath Sax, M.D.     PP/MEDQ  D:  02/15/2012  T:  02/16/2012  Job:  [id number]

## 2012-02-18 ENCOUNTER — Other Ambulatory Visit: Payer: Medicare Other | Admitting: Lab

## 2012-02-24 ENCOUNTER — Other Ambulatory Visit: Payer: Self-pay | Admitting: Family Medicine

## 2012-02-24 ENCOUNTER — Other Ambulatory Visit: Payer: Self-pay | Admitting: *Deleted

## 2012-02-24 MED ORDER — HYDROCODONE-ACETAMINOPHEN 7.5-500 MG PO TABS: 1.0000 | ORAL_TABLET | Freq: Three times a day (TID) | ORAL | Status: DC | PRN

## 2012-02-24 NOTE — Telephone Encounter (Signed)
[facility]ed [facility].../lmb 

## 2012-03-20 ENCOUNTER — Encounter (HOSPITAL_BASED_OUTPATIENT_CLINIC_OR_DEPARTMENT_OTHER): Payer: Medicare Other | Attending: General Surgery

## 2012-03-20 ENCOUNTER — Telehealth: Payer: Self-pay | Admitting: Internal Medicine

## 2012-03-20 DIAGNOSIS — I87319 Chronic venous hypertension (idiopathic) with ulcer of unspecified lower extremity: Secondary | ICD-10-CM | POA: Insufficient documentation

## 2012-03-20 DIAGNOSIS — L97909 Non-pressure chronic ulcer of unspecified part of unspecified lower leg with unspecified severity: Secondary | ICD-10-CM | POA: Insufficient documentation

## 2012-03-20 DIAGNOSIS — L02419 Cutaneous abscess of limb, unspecified: Secondary | ICD-10-CM | POA: Insufficient documentation

## 2012-03-20 NOTE — Telephone Encounter (Signed)
A fax is coming from [facility] for a Knee Brace for his left knee.  He wanted [place] to be aware.

## 2012-03-20 NOTE — Telephone Encounter (Signed)
Md already received form & has been fax back...lmb

## 2012-03-21 ENCOUNTER — Encounter (HOSPITAL_BASED_OUTPATIENT_CLINIC_OR_DEPARTMENT_OTHER): Payer: Medicare Other

## 2012-03-23 ENCOUNTER — Other Ambulatory Visit: Payer: Self-pay | Admitting: *Deleted

## 2012-03-23 MED ORDER — HYDROCODONE-ACETAMINOPHEN 7.5-500 MG PO TABS: 1.0000 | ORAL_TABLET | Freq: Three times a day (TID) | ORAL | Status: DC | PRN

## 2012-03-23 NOTE — Telephone Encounter (Signed)
Faxed script back to [facility]...lmb

## 2012-03-23 NOTE — Telephone Encounter (Signed)
Ok to refill Fernando Lane 

## 2012-03-30 ENCOUNTER — Telehealth: Payer: Self-pay | Admitting: *Deleted

## 2012-03-30 NOTE — Telephone Encounter (Signed)
Pt called regarding lab appointment and follow up with new MD. Pt was seen by Dr Dalene Carrow on 01/18/12, he did have a phlebotomy on 01/18/12. I gave pt his lab results from 02/15/12- Hct 49.7 Dr Lonell Face note indicates another office visit in "9 months" which would be ~ August 2014. Told pt we will be in touch to schedule that appointment, if he has questions in the meantime-asked him to call us back.

## 2012-03-31 ENCOUNTER — Other Ambulatory Visit: Payer: Self-pay | Admitting: Internal Medicine

## 2012-04-03 ENCOUNTER — Other Ambulatory Visit: Payer: Self-pay | Admitting: Internal Medicine

## 2012-04-03 ENCOUNTER — Other Ambulatory Visit: Payer: Self-pay | Admitting: Physician Assistant

## 2012-04-10 ENCOUNTER — Other Ambulatory Visit: Payer: Self-pay | Admitting: *Deleted

## 2012-04-10 MED ORDER — MECLIZINE HCL 12.5 MG PO TABS: 12.5000 mg | ORAL_TABLET | ORAL | Status: DC | PRN

## 2012-04-10 MED ORDER — METOPROLOL SUCCINATE ER 25 MG PO TB24: 25.0000 mg | ORAL_TABLET | Freq: Every day | ORAL | Status: DC

## 2012-04-17 ENCOUNTER — Other Ambulatory Visit: Payer: Self-pay | Admitting: *Deleted

## 2012-04-17 ENCOUNTER — Encounter (HOSPITAL_BASED_OUTPATIENT_CLINIC_OR_DEPARTMENT_OTHER): Payer: Medicare Other | Attending: General Surgery

## 2012-04-17 DIAGNOSIS — L97909 Non-pressure chronic ulcer of unspecified part of unspecified lower leg with unspecified severity: Secondary | ICD-10-CM | POA: Insufficient documentation

## 2012-04-17 NOTE — Telephone Encounter (Signed)
R'cd fax from [facility] for refill of Hydrocodone-APAP 7.5-500mg -last written 03/23/2012 #90 with 0 refills-please advise.

## 2012-04-18 MED ORDER — HYDROCODONE-ACETAMINOPHEN 7.5-500 MG PO TABS: 1.0000 | ORAL_TABLET | Freq: Three times a day (TID) | ORAL | Status: DC | PRN

## 2012-04-18 NOTE — Telephone Encounter (Signed)
[facility]ed [facility].../lmb 

## 2012-04-20 ENCOUNTER — Telehealth: Payer: Self-pay | Admitting: Medical Oncology

## 2012-04-20 ENCOUNTER — Other Ambulatory Visit: Payer: Self-pay | Admitting: Medical Oncology

## 2012-04-20 DIAGNOSIS — D751 Secondary polycythemia: Secondary | ICD-10-CM

## 2012-04-20 NOTE — Telephone Encounter (Signed)
I spoke with pt to let him that I spoke with Dr.Murinson regarding lab appointments and follow up. Because he is taking Hydrea Dr. Arline Asp would like to check monthly labs and then move his follow up visit to May. He stated he was worried because he was getting labs every 3 months and MD visit every 6 months. He is pleased and is aware the schedulers will call him with dates and times.

## 2012-04-24 NOTE — Progress Notes (Signed)
Reviewed and agree.

## 2012-05-15 ENCOUNTER — Encounter (HOSPITAL_BASED_OUTPATIENT_CLINIC_OR_DEPARTMENT_OTHER): Payer: Medicare Other | Attending: General Surgery

## 2012-05-15 ENCOUNTER — Ambulatory Visit: Payer: Self-pay | Admitting: Internal Medicine

## 2012-05-15 ENCOUNTER — Other Ambulatory Visit: Payer: Self-pay | Admitting: Internal Medicine

## 2012-05-15 DIAGNOSIS — X58XXXA Exposure to other specified factors, initial encounter: Secondary | ICD-10-CM | POA: Insufficient documentation

## 2012-05-15 DIAGNOSIS — L97909 Non-pressure chronic ulcer of unspecified part of unspecified lower leg with unspecified severity: Secondary | ICD-10-CM | POA: Insufficient documentation

## 2012-05-15 DIAGNOSIS — IMO0002 Reserved for concepts with insufficient information to code with codable children: Secondary | ICD-10-CM | POA: Insufficient documentation

## 2012-06-05 ENCOUNTER — Ambulatory Visit: Payer: Medicare Other | Admitting: Internal Medicine

## 2012-06-06 ENCOUNTER — Ambulatory Visit: Payer: Medicare Other | Admitting: Internal Medicine

## 2012-06-08 ENCOUNTER — Other Ambulatory Visit: Payer: Self-pay | Admitting: Family Medicine

## 2012-06-08 ENCOUNTER — Other Ambulatory Visit: Payer: Self-pay | Admitting: Internal Medicine

## 2012-06-08 NOTE — Telephone Encounter (Signed)
From chart review, looks like pt has established with Dr. Felicity Coyer, is this correct?  If so, pt should get RFs from that office

## 2012-06-09 ENCOUNTER — Other Ambulatory Visit: Payer: Self-pay

## 2012-06-09 MED ORDER — LISINOPRIL-HYDROCHLOROTHIAZIDE 20-12.5 MG PO TABS: 1.0000 | ORAL_TABLET | Freq: Every day | ORAL | Status: DC

## 2012-06-09 NOTE — Telephone Encounter (Signed)
Pt verified that Dr Felicity Coyer is managing his BP and the RF request should have been sent there. He will contact pharmacy.

## 2012-06-12 ENCOUNTER — Ambulatory Visit: Payer: Medicare Other | Admitting: Internal Medicine

## 2012-06-13 ENCOUNTER — Other Ambulatory Visit: Payer: Self-pay | Admitting: *Deleted

## 2012-06-13 MED ORDER — LISINOPRIL-HYDROCHLOROTHIAZIDE 20-12.5 MG PO TABS: 1.0000 | ORAL_TABLET | Freq: Every day | ORAL | Status: DC

## 2012-06-14 ENCOUNTER — Encounter (HOSPITAL_BASED_OUTPATIENT_CLINIC_OR_DEPARTMENT_OTHER): Payer: Medicare Other | Attending: General Surgery

## 2012-06-14 DIAGNOSIS — L97809 Non-pressure chronic ulcer of other part of unspecified lower leg with unspecified severity: Secondary | ICD-10-CM | POA: Insufficient documentation

## 2012-06-14 DIAGNOSIS — L97909 Non-pressure chronic ulcer of unspecified part of unspecified lower leg with unspecified severity: Secondary | ICD-10-CM | POA: Insufficient documentation

## 2012-06-14 DIAGNOSIS — I87319 Chronic venous hypertension (idiopathic) with ulcer of unspecified lower extremity: Secondary | ICD-10-CM | POA: Insufficient documentation

## 2012-06-15 ENCOUNTER — Telehealth: Payer: Self-pay | Admitting: Oncology

## 2012-06-15 NOTE — Telephone Encounter (Signed)
In checking over reassignments list for patients that were unable be contacted the 2/6 phone from the nurse indicated that DM wanted to change orders to have pt come monthly for lbs and see him in May. Phone note also indicated that pof had been sent. Checked for pof and no pof in inbox for pt check flowsheets from chart and found 2/6 pof listed requesting monthly lb and f/u w/DM 5/30. S/w pt today re appts for 4/7, 5/5, and 5/30. Pt can get new schedule when she comes in 4/7.

## 2012-06-19 ENCOUNTER — Telehealth: Payer: Self-pay | Admitting: Oncology

## 2012-06-19 ENCOUNTER — Other Ambulatory Visit: Payer: Medicare Other | Admitting: Lab

## 2012-06-19 ENCOUNTER — Ambulatory Visit: Payer: Medicare Other | Admitting: Internal Medicine

## 2012-06-26 ENCOUNTER — Ambulatory Visit (INDEPENDENT_AMBULATORY_CARE_PROVIDER_SITE_OTHER): Payer: Medicare Other | Admitting: Internal Medicine

## 2012-06-26 ENCOUNTER — Other Ambulatory Visit (HOSPITAL_BASED_OUTPATIENT_CLINIC_OR_DEPARTMENT_OTHER): Payer: Medicare Other | Admitting: Lab

## 2012-06-26 ENCOUNTER — Encounter: Payer: Self-pay | Admitting: Internal Medicine

## 2012-06-26 ENCOUNTER — Other Ambulatory Visit: Payer: Self-pay | Admitting: Medical Oncology

## 2012-06-26 ENCOUNTER — Other Ambulatory Visit: Payer: Self-pay | Admitting: Oncology

## 2012-06-26 VITALS — BP 118/62 | HR 75 | Temp 97.9°F | Wt 253.8 lb

## 2012-06-26 DIAGNOSIS — D751 Secondary polycythemia: Secondary | ICD-10-CM

## 2012-06-26 DIAGNOSIS — N529 Male erectile dysfunction, unspecified: Secondary | ICD-10-CM

## 2012-06-26 DIAGNOSIS — F3289 Other specified depressive episodes: Secondary | ICD-10-CM

## 2012-06-26 DIAGNOSIS — D45 Polycythemia vera: Secondary | ICD-10-CM

## 2012-06-26 DIAGNOSIS — F329 Major depressive disorder, single episode, unspecified: Secondary | ICD-10-CM | POA: Insufficient documentation

## 2012-06-26 DIAGNOSIS — IMO0002 Reserved for concepts with insufficient information to code with codable children: Secondary | ICD-10-CM

## 2012-06-26 DIAGNOSIS — E785 Hyperlipidemia, unspecified: Secondary | ICD-10-CM

## 2012-06-26 DIAGNOSIS — F32A Depression, unspecified: Secondary | ICD-10-CM | POA: Insufficient documentation

## 2012-06-26 LAB — CBC WITH DIFFERENTIAL/PLATELET
Basophils Absolute: 0 10*3/uL (ref 0.0–0.1)
Eosinophils Absolute: 0.3 10*3/uL (ref 0.0–0.5)
HGB: 15.8 g/dL (ref 13.0–17.1)
MCV: 106.9 fL — ABNORMAL HIGH (ref 79.3–98.0)
MONO#: 0.2 10*3/uL (ref 0.1–0.9)
MONO%: 3.3 % (ref 0.0–14.0)
NEUT#: 5.1 10*3/uL (ref 1.5–6.5)
RDW: 17.6 % — ABNORMAL HIGH (ref 11.0–14.6)
WBC: 6.7 10*3/uL (ref 4.0–10.3)
lymph#: 1.1 10*3/uL (ref 0.9–3.3)

## 2012-06-26 MED ORDER — SILDENAFIL CITRATE 100 MG PO TABS: 50.0000 mg | ORAL_TABLET | Freq: Every day | ORAL | Status: DC | PRN

## 2012-06-26 MED ORDER — DULOXETINE HCL 30 MG PO CPEP: 30.0000 mg | ORAL_CAPSULE | Freq: Every day | ORAL | Status: DC

## 2012-06-26 MED ORDER — AMPICILLIN 500 MG PO CAPS: 500.0000 mg | ORAL_CAPSULE | Freq: Four times a day (QID) | ORAL | Status: DC

## 2012-06-26 NOTE — Assessment & Plan Note (Signed)
Last lipids reviewed, check annually - acceptable per cards review 01/2010 changed atorva to prava 01/2012 Encouraged to resume work on diet/lifestyle control of same Lab Results  Component Value Date   LDLCALC 64 08/02/2011

## 2012-06-26 NOTE — Assessment & Plan Note (Signed)
Previously on Cialis prn with good results , but now "not working" Requests Viagra trial - rx done

## 2012-06-26 NOTE — Assessment & Plan Note (Signed)
Chronic low grade symptoms - exac by med illness and chronic pain Start Cymbalta now - we reviewed potential risk/benefit and possible side effects - pt understands and agrees to same  Verified no SI/HI follow up 6 weeks on same, sooner if worse

## 2012-06-26 NOTE — Progress Notes (Signed)
Subjective:    Patient ID: Fernando Lane, male    DOB: [birth year], 62 y.o.   MRN: [id number]  HPI  Here for follow up - reviewed chronic medical issues:  HTN - reports compliance with ongoing medical treatment and no changes in medication dose or frequency. denies adverse side effects related to current therapy.    low back pain, chronic - worse symptoms with notable weight gain pain better s/p ESI by dr. Venetia Maxon fall 2010 -   neck pain, chronic Saw Dr Yetta Barre 01/2011 > MRI c-spine with cervical radiculopathy with L shoulder pain symptoms  chronic use of lortab and ultram reviewed -   OSA - continues qhs CPAP without change - sleeping reasonable well - no snoring or change in sleep - no daytime fatigue  PCV - taking hydroxyurea without change - no mucosal bleeding or rectal bleeding - no fevers or weight loss - labs and OV with heme q 6 mo  dyslipidemia - variable compliance with statin due to myalgia and ?memory loss - follows closely with cards due to hx of CAD/PVD cards -  Increase in depression symptoms - ?meds for same - denies SI/HI   Past Medical History  Diagnosis Date  . Chronic low back pain   . 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 Chronic neck pain   . Hypertension   . OSA (obstructive sleep apnea)     CPAP @ bedtime  . Allergy     Review of Systems  Constitutional: Positive for fatigue. Negative for unexpected weight change.  Respiratory: Negative for cough and shortness of breath.   Cardiovascular: Negative for chest pain and palpitations.  Musculoskeletal: Positive for back pain.  Neurological: Negative for dizziness and headaches.       Objective:   Physical Exam  BP 118/62  Pulse 75  Temp(Src) 97.9 F (36.6 C) (Oral)  Wt 253 lb 12.8 oz (115.123 kg)  BMI 41.58 kg/m2  SpO2 98% Wt Readings from Last 3 Encounters:  06/26/12 253 lb 12.8 oz (115.123 kg)  01/28/12 238 lb (107.956 kg)  01/18/12 239  lb 8 oz (108.636 kg)   Constitutional: Obese; appears well-developed and well-nourished. No distress.  Neck: Thick. Normal range of motion. Neck supple. No JVD present. No thyromegaly present.  Cardiovascular: Normal rate, regular rhythm and normal heart sounds.  No murmur heard.  Chronic 1+ BLE edema Pulmonary/Chest: Effort normal and breath sounds normal. No respiratory distress. no wheezes.  Skin: distal RLE with redness (sunburn, not cellulitis) below knee to foot: red, swollen and warm to touch. RLE with 3 ulcers, stage 1: largest 5 cm round lateral posterior side of calf, 2nd on posterior calf 15mm and smallest 5 mm on anterior shin -L knee with chronic stage 1 ulceration over patella - 5mm round, no eschar or infection Psychiatric: he has a dysphoric mood and affect. behavior is normal. Judgment and thought content normal.   Lab Results  Component Value Date   WBC 9.1 02/15/2012   HGB 16.8 02/15/2012   HCT 49.7 02/15/2012   PLT 391 02/15/2012   CHOL 134 08/02/2011   TRIG 117.0 08/02/2011   HDL 47.10 08/02/2011   ALT 34 08/02/2011   AST 23 08/02/2011   NA 134* 01/18/2012   K 4.0 01/18/2012   CL 101 01/18/2012   CREATININE 0.8 01/18/2012   BUN 20.0 01/18/2012   CO2 25 01/18/2012   TSH 2.01 07/30/2009   HGBA1C 5.2 11/19/2011  Assessment & Plan:   see problem list. Medications and labs reviewed today.  RLE cellulitis and open leg wounds -  resume ampicillin antibiotics given poor response to [facility] for wound care ongoing with [facility] clinic since 02/15/12 continue pain mgmt - same as ongoing for chronic back and neck pain symptoms
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
## 2012-06-26 NOTE — Patient Instructions (Signed)
It was good to see you today. We have reviewed your prior records including labs and tests today Resume ampicillin antibiotics -and take Pravastatin for cholesterol Try Viagra in place of Cialis Start Cymbalta for depression symptoms an pain management  Your prescription(s) have been given to you to submit to your pharmacy. Please take as directed and contact our office if you believe you are having problem(s) with the medication(s). Please schedule followup in 6 months, call sooner if problems.

## 2012-06-27 ENCOUNTER — Telehealth: Payer: Self-pay

## 2012-06-27 ENCOUNTER — Telehealth: Payer: Self-pay | Admitting: *Deleted

## 2012-06-27 ENCOUNTER — Telehealth: Payer: Self-pay | Admitting: Oncology

## 2012-06-27 ENCOUNTER — Other Ambulatory Visit: Payer: Self-pay

## 2012-06-27 NOTE — Telephone Encounter (Signed)
S/w pt and answered many questions about phlebotomy and procedure here. Confirmed appt dates and times.

## 2012-06-27 NOTE — Telephone Encounter (Signed)
Pt called re message left for him about his appts and wanted to confirm. Confirmed appts for 5/12 and 5/30. Per pt message mentioned a phleb. Per 4/14 pof monthly cbc w/poss phleb. Added phleb to 5/12 lb appt. Pt aware. Remaining appt if needed can be added after pt see DM 5/30.

## 2012-06-27 NOTE — Telephone Encounter (Signed)
lvm that Dr Arline Asp wants pt to have a phlebotomy this week from labs drawn 06/26/12. The appt is 115 pm on Thursday the 17th.

## 2012-06-27 NOTE — Telephone Encounter (Signed)
Pt already spoke with Dr Murinson's nurse, had left VM for Dr Precious Reel nurse.Marland KitchenMarland KitchenMarland Kitchen

## 2012-06-28 ENCOUNTER — Telehealth: Payer: Self-pay | Admitting: *Deleted

## 2012-06-28 NOTE — Telephone Encounter (Signed)
Patient called and moved his appt from tomorrow to Monday.  JMW

## 2012-06-29 ENCOUNTER — Telehealth: Payer: Self-pay | Admitting: Internal Medicine

## 2012-06-29 NOTE — Telephone Encounter (Signed)
Patient wants a call back informing him of his blood type

## 2012-07-03 ENCOUNTER — Ambulatory Visit (HOSPITAL_BASED_OUTPATIENT_CLINIC_OR_DEPARTMENT_OTHER): Payer: Medicare Other

## 2012-07-03 DIAGNOSIS — D45 Polycythemia vera: Secondary | ICD-10-CM

## 2012-07-03 NOTE — Telephone Encounter (Signed)
Called pt no answer LMOM with regina response...lmb

## 2012-07-03 NOTE — Patient Instructions (Signed)
Therapeutic Phlebotomy Therapeutic phlebotomy is the controlled removal of blood from your body for the purpose of treating a medical condition. It is similar to donating blood. Usually, about a pint (470 mL) of blood is removed. The average adult has 9 to 12 pints (4.3 to 5.7 L) of blood. Therapeutic phlebotomy may be used to treat the following medical conditions:  Hemochromatosis. This is a condition in which there is too much iron in the blood.  Polycythemia vera. This is a condition in which there are too many red cells in the blood.  Porphyria cutanea tarda. This is a disease usually passed from one generation to the next (inherited). It is a condition in which an important part of hemoglobin is not made properly. This results in the build up of abnormal amounts of porphyrins in the body.  Sickle cell disease. This is an inherited disease. It is a condition in which the red blood cells form an abnormal crescent shape rather than a round shape. LET YOUR CAREGIVER KNOW ABOUT:  Allergies.  Medicines taken including herbs, eyedrops, over-the-counter medicines, and creams.  Use of steroids (by mouth or creams).  Previous problems with anesthetics or numbing medicine.  History of blood clots.  History of bleeding or blood problems.  Previous surgery.  Possibility of pregnancy, if this applies. RISKS AND COMPLICATIONS This is a simple and safe procedure. Problems are unlikely. However, problems can occur and may include:  Nausea or lightheadedness.  Low blood pressure.  Soreness, bleeding, swelling, or bruising at the needle insertion site.  Infection. BEFORE THE PROCEDURE  This is a procedure that can be done as an outpatient. Confirm the time that you need to arrive for your procedure. Confirm whether there is a need to fast or withhold any medications. It is helpful to wear clothing with sleeves that can be raised above the elbow. A blood sample may be done to determine the  amount of red blood cells or iron in your blood. Plan ahead of time to have someone drive you home after the procedure. PROCEDURE The entire procedure from preparation through recovery takes about 1 hour. The actual collection takes about 10 to 15 minutes.  A needle will be inserted into your vein.  Tubing and a collection bag will be attached to that needle.  Blood will flow through the needle and tubing into the collection bag.  You may be asked to open and close your hand slowly and continuously during the entire collection.  Once the specified amount of blood has been removed from your body, the collection bag and tubing will be clamped.  The needle will be removed.  Pressure will be held on the site of the needle insertion to stop the bleeding. Then a bandage will be placed over the needle insertion site. AFTER THE PROCEDURE  Your recovery will be assessed and monitored. If there are no problems, as an outpatient, you should be able to go home shortly after the procedure.  Document Released: 08/03/2010 Document Revised: 05/24/2011 Document Reviewed: 08/03/2010 ExitCare Patient Information 2013 [employer].  

## 2012-07-03 NOTE — Telephone Encounter (Signed)
Lucy, I can't see where he has has his blood type drawn. If he would like I can put an order in for him and he can come to the lab and we can call him with the results. Rene Kocher

## 2012-07-03 NOTE — Progress Notes (Signed)
503 units removed using a 16 gauge phlebotomy set. Patient tolerated well. Nourishment provided.

## 2012-07-05 ENCOUNTER — Other Ambulatory Visit: Payer: Self-pay | Admitting: Family Medicine

## 2012-07-06 ENCOUNTER — Other Ambulatory Visit: Payer: Self-pay | Admitting: Internal Medicine

## 2012-07-17 ENCOUNTER — Ambulatory Visit: Payer: Medicare Other | Admitting: Internal Medicine

## 2012-07-17 ENCOUNTER — Encounter (HOSPITAL_BASED_OUTPATIENT_CLINIC_OR_DEPARTMENT_OTHER): Payer: Medicare Other | Attending: General Surgery

## 2012-07-17 ENCOUNTER — Other Ambulatory Visit: Payer: Medicare Other

## 2012-07-17 DIAGNOSIS — I872 Venous insufficiency (chronic) (peripheral): Secondary | ICD-10-CM | POA: Insufficient documentation

## 2012-07-17 DIAGNOSIS — L97909 Non-pressure chronic ulcer of unspecified part of unspecified lower leg with unspecified severity: Secondary | ICD-10-CM | POA: Insufficient documentation

## 2012-07-17 DIAGNOSIS — I87309 Chronic venous hypertension (idiopathic) without complications of unspecified lower extremity: Secondary | ICD-10-CM | POA: Insufficient documentation

## 2012-07-18 ENCOUNTER — Telehealth: Payer: Self-pay | Admitting: Cardiology

## 2012-07-18 NOTE — Telephone Encounter (Signed)
New Prob     Calling in inquiring about arterial specialist and Dr. Shirlee Latch deals with that. Would like to speak to nurse.

## 2012-07-18 NOTE — Telephone Encounter (Signed)
Spoke with patient about a referral the wound doctor made for him to Dr Tresa Endo.

## 2012-07-24 ENCOUNTER — Other Ambulatory Visit: Payer: Medicare Other | Admitting: Lab

## 2012-07-26 ENCOUNTER — Ambulatory Visit: Payer: Medicare Other | Admitting: Cardiovascular Disease

## 2012-07-28 ENCOUNTER — Telehealth: Payer: Self-pay | Admitting: Oncology

## 2012-07-28 NOTE — Telephone Encounter (Signed)
Returned pt's call and lmonvm w/new appt d/t for lb/phleb 5/19. appt r/s from 5/12 per pt.

## 2012-07-31 ENCOUNTER — Other Ambulatory Visit: Payer: Medicare Other | Admitting: Lab

## 2012-07-31 ENCOUNTER — Ambulatory Visit: Payer: Medicare Other

## 2012-07-31 NOTE — Progress Notes (Signed)
NO SHOW

## 2012-08-01 ENCOUNTER — Telehealth: Payer: Self-pay | Admitting: *Deleted

## 2012-08-01 ENCOUNTER — Other Ambulatory Visit: Payer: Self-pay | Admitting: Internal Medicine

## 2012-08-01 NOTE — Telephone Encounter (Signed)
The hydrocodone 7.5 / 500 is no longer made.  Could it be changed to 7.5 / 325.

## 2012-08-01 NOTE — Telephone Encounter (Signed)
Left msg on triage stating he has a heat rash under his arm. Wanting to know can he use the nystatin cream that was rx for his leg...lmb

## 2012-08-01 NOTE — Telephone Encounter (Signed)
Called pt no answer LMOM md response...lmb 

## 2012-08-01 NOTE — Telephone Encounter (Signed)
I would recommend over-the-counter cortisone cream Okay to try nystatin cream as well

## 2012-08-02 ENCOUNTER — Telehealth: Payer: Self-pay | Admitting: *Deleted

## 2012-08-02 ENCOUNTER — Ambulatory Visit: Payer: Medicare Other | Admitting: Cardiology

## 2012-08-02 MED ORDER — HYDROCODONE-ACETAMINOPHEN 7.5-325 MG PO TABS: 1.0000 | ORAL_TABLET | Freq: Three times a day (TID) | ORAL | Status: DC | PRN

## 2012-08-02 NOTE — Telephone Encounter (Signed)
Received fax stating pt wanting refill on his hydrocodone 7.5/500 mg. That strength no longer available. Requesting to change to 7.5/325 mg...Raechel Chute

## 2012-08-02 NOTE — Telephone Encounter (Signed)
Ok done

## 2012-08-02 NOTE — Telephone Encounter (Signed)
sw pt gv appt for lab and phlebotomy on 08/04/12 @ 1pm. Pt is aware...td

## 2012-08-02 NOTE — Telephone Encounter (Signed)
[facility]ed [facility].../lmb 

## 2012-08-04 ENCOUNTER — Other Ambulatory Visit: Payer: Self-pay | Admitting: Medical Oncology

## 2012-08-04 ENCOUNTER — Other Ambulatory Visit (HOSPITAL_BASED_OUTPATIENT_CLINIC_OR_DEPARTMENT_OTHER): Payer: Medicare Other

## 2012-08-04 ENCOUNTER — Ambulatory Visit (HOSPITAL_BASED_OUTPATIENT_CLINIC_OR_DEPARTMENT_OTHER): Payer: Medicare Other

## 2012-08-04 ENCOUNTER — Telehealth: Payer: Self-pay | Admitting: Medical Oncology

## 2012-08-04 VITALS — BP 120/64 | HR 80 | Temp 97.6°F | Resp 18

## 2012-08-04 DIAGNOSIS — D45 Polycythemia vera: Secondary | ICD-10-CM

## 2012-08-04 DIAGNOSIS — D751 Secondary polycythemia: Secondary | ICD-10-CM

## 2012-08-04 LAB — CBC WITH DIFFERENTIAL/PLATELET
EOS%: 4.4 % (ref 0.0–7.0)
Eosinophils Absolute: 0.3 10*3/uL (ref 0.0–0.5)
MCV: 108.5 fL — ABNORMAL HIGH (ref 79.3–98.0)
MONO%: 1.6 % (ref 0.0–14.0)
NEUT#: 6.5 10*3/uL (ref 1.5–6.5)
RBC: 4.31 10*6/uL (ref 4.20–5.82)
RDW: 17.6 % — ABNORMAL HIGH (ref 11.0–14.6)
WBC: 7.7 10*3/uL (ref 4.0–10.3)
lymph#: 0.8 10*3/uL — ABNORMAL LOW (ref 0.9–3.3)

## 2012-08-04 LAB — COMPREHENSIVE METABOLIC PANEL (CC13)
AST: 20 U/L (ref 5–34)
Albumin: 3.6 g/dL (ref 3.5–5.0)
Alkaline Phosphatase: 100 U/L (ref 40–150)
Glucose: 164 mg/dl — ABNORMAL HIGH (ref 70–99)
Potassium: 3.4 mEq/L — ABNORMAL LOW (ref 3.5–5.1)
Sodium: 139 mEq/L (ref 136–145)
Total Protein: 6.7 g/dL (ref 6.4–8.3)

## 2012-08-04 NOTE — Patient Instructions (Addendum)
Therapeutic Phlebotomy Therapeutic phlebotomy is the controlled removal of blood from your body for the purpose of treating a medical condition. It is similar to donating blood. Usually, about a pint (470 mL) of blood is removed. The average adult has 9 to 12 pints (4.3 to 5.7 L) of blood. Therapeutic phlebotomy may be used to treat the following medical conditions:  Hemochromatosis. This is a condition in which there is too much iron in the blood.  Polycythemia vera. This is a condition in which there are too many red cells in the blood.  Porphyria cutanea tarda. This is a disease usually passed from one generation to the next (inherited). It is a condition in which an important part of hemoglobin is not made properly. This results in the build up of abnormal amounts of porphyrins in the body.  Sickle cell disease. This is an inherited disease. It is a condition in which the red blood cells form an abnormal crescent shape rather than a round shape. LET YOUR CAREGIVER KNOW ABOUT:  Allergies.  Medicines taken including herbs, eyedrops, over-the-counter medicines, and creams.  Use of steroids (by mouth or creams).  Previous problems with anesthetics or numbing medicine.  History of blood clots.  History of bleeding or blood problems.  Previous surgery.  Possibility of pregnancy, if this applies. RISKS AND COMPLICATIONS This is a simple and safe procedure. Problems are unlikely. However, problems can occur and may include:  Nausea or lightheadedness.  Low blood pressure.  Soreness, bleeding, swelling, or bruising at the needle insertion site.  Infection. BEFORE THE PROCEDURE  This is a procedure that can be done as an outpatient. Confirm the time that you need to arrive for your procedure. Confirm whether there is a need to fast or withhold any medications. It is helpful to wear clothing with sleeves that can be raised above the elbow. A blood sample may be done to determine the  amount of red blood cells or iron in your blood. Plan ahead of time to have someone drive you home after the procedure. PROCEDURE The entire procedure from preparation through recovery takes about 1 hour. The actual collection takes about 10 to 15 minutes.  A needle will be inserted into your vein.  Tubing and a collection bag will be attached to that needle.  Blood will flow through the needle and tubing into the collection bag.  You may be asked to open and close your hand slowly and continuously during the entire collection.  Once the specified amount of blood has been removed from your body, the collection bag and tubing will be clamped.  The needle will be removed.  Pressure will be held on the site of the needle insertion to stop the bleeding. Then a bandage will be placed over the needle insertion site. AFTER THE PROCEDURE  Your recovery will be assessed and monitored. If there are no problems, as an outpatient, you should be able to go home shortly after the procedure.  Document Released: 08/03/2010 Document Revised: 05/24/2011 Document Reviewed: 08/03/2010 ExitCare Patient Information 2014 [employer].  

## 2012-08-04 NOTE — Progress Notes (Signed)
Hct  46.7 ;  Hgb  15.8  Today.   Phlebotomy performed in right anterior hand with 18G x 1.16" angiocath without difficulty.   532 gms of blood obtained and wasted.  Pt tolerated procedure without problems.  Nourishments given to pt.  AVS printed and gave to pt.  Pt was stable at discharge via ambulation with walker. Pt did not eat prior to phlebotomy procedure.  Nourishments given to pt prior to phlebotomy.

## 2012-08-08 NOTE — Telephone Encounter (Signed)
Opened in error

## 2012-08-09 ENCOUNTER — Other Ambulatory Visit: Payer: Self-pay | Admitting: Oncology

## 2012-08-09 DIAGNOSIS — D751 Secondary polycythemia: Secondary | ICD-10-CM

## 2012-08-10 ENCOUNTER — Telehealth: Payer: Self-pay | Admitting: Oncology

## 2012-08-10 ENCOUNTER — Other Ambulatory Visit: Payer: Self-pay

## 2012-08-10 NOTE — Telephone Encounter (Signed)
Pt called today to r/s 5/30 appt to next wk. No avail and pt forwarded to desk nurse to lm. Per pt appt for 5/30 cx'd. Also s/w desk nurse re this and when pt can be seen and does he need additional lb f/u appts.

## 2012-08-10 NOTE — Progress Notes (Signed)
S/w Dr Arline Asp, he reviewed pt chart and said to schedule CBC and phlebotomy monthly and pt will be seen by another MD in a few months. Prime importance is the lab/phlebotomy appts. POF sent and phone message left with Melissa.

## 2012-08-11 ENCOUNTER — Ambulatory Visit: Payer: Medicare Other | Admitting: Oncology

## 2012-08-11 ENCOUNTER — Telehealth: Payer: Self-pay | Admitting: *Deleted

## 2012-08-11 ENCOUNTER — Other Ambulatory Visit: Payer: Medicare Other

## 2012-08-11 NOTE — Telephone Encounter (Signed)
Per staff phone call and POF I have schedueld appts.  JMW  

## 2012-08-14 ENCOUNTER — Telehealth: Payer: Self-pay | Admitting: Oncology

## 2012-08-14 ENCOUNTER — Encounter (HOSPITAL_BASED_OUTPATIENT_CLINIC_OR_DEPARTMENT_OTHER): Payer: Medicare Other | Attending: General Surgery

## 2012-08-14 DIAGNOSIS — L97809 Non-pressure chronic ulcer of other part of unspecified lower leg with unspecified severity: Secondary | ICD-10-CM | POA: Insufficient documentation

## 2012-08-14 DIAGNOSIS — I87309 Chronic venous hypertension (idiopathic) without complications of unspecified lower extremity: Secondary | ICD-10-CM | POA: Insufficient documentation

## 2012-08-14 DIAGNOSIS — I872 Venous insufficiency (chronic) (peripheral): Secondary | ICD-10-CM | POA: Insufficient documentation

## 2012-08-16 ENCOUNTER — Telehealth: Payer: Self-pay

## 2012-08-16 NOTE — Telephone Encounter (Signed)
Pt called asking about glucose level. He is concerned b/c he has a wound that is not healing. He is seeing wound clinic regularly for debridement. I encouraged him to talk with wound clinic MD and maybe ask for a second opinion. He stated that Tammi H did a wonderful phlebotomy last visit and wished he could request her every time.  I sent copy of POF to [facility] to get him OV scheduled in July because the May 30 appt was cancelled by RN.

## 2012-08-31 ENCOUNTER — Telehealth: Payer: Self-pay

## 2012-08-31 ENCOUNTER — Telehealth: Payer: Self-pay | Admitting: Oncology

## 2012-08-31 NOTE — Telephone Encounter (Signed)
Patient called lmovm requesting a call back,  has question

## 2012-09-01 ENCOUNTER — Other Ambulatory Visit: Payer: Medicare Other | Admitting: Lab

## 2012-09-01 ENCOUNTER — Telehealth: Payer: Self-pay

## 2012-09-01 DIAGNOSIS — D751 Secondary polycythemia: Secondary | ICD-10-CM

## 2012-09-01 MED ORDER — PRAVASTATIN SODIUM 20 MG PO TABS: 20.0000 mg | ORAL_TABLET | Freq: Every day | ORAL | Status: DC

## 2012-09-01 MED ORDER — NAPROXEN 500 MG PO TABS: 500.0000 mg | ORAL_TABLET | Freq: Two times a day (BID) | ORAL | Status: DC

## 2012-09-01 MED ORDER — HYDROCODONE-ACETAMINOPHEN 7.5-325 MG PO TABS: 1.0000 | ORAL_TABLET | Freq: Three times a day (TID) | ORAL | Status: DC | PRN

## 2012-09-01 MED ORDER — TRAMADOL HCL 50 MG PO TABS: ORAL_TABLET | ORAL | Status: DC

## 2012-09-01 MED ORDER — MUPIROCIN 2 % EX OINT: TOPICAL_OINTMENT | CUTANEOUS | Status: DC

## 2012-09-01 MED ORDER — MECLIZINE HCL 12.5 MG PO TABS: ORAL_TABLET | ORAL | Status: DC

## 2012-09-01 MED ORDER — METOPROLOL SUCCINATE ER 25 MG PO TB24: 25.0000 mg | ORAL_TABLET | Freq: Every day | ORAL | Status: DC

## 2012-09-01 MED ORDER — HYDROXYUREA 500 MG PO CAPS: ORAL_CAPSULE | ORAL | Status: DC

## 2012-09-01 NOTE — Telephone Encounter (Signed)
Patient called advising that he no longer uses [facility] and request 8 medication to be filled at [facility]. I advised that I will send  supply due to being out this weekend and MD not here. Called [facility] to requested all remaining refills cancelled, pt last filled hydrocone on 08/02/12 per rep

## 2012-09-06 ENCOUNTER — Telehealth: Payer: Self-pay | Admitting: Oncology

## 2012-09-07 ENCOUNTER — Telehealth: Payer: Self-pay | Admitting: *Deleted

## 2012-09-07 NOTE — Telephone Encounter (Signed)
Per staff message I have moved appt from 7/2 to 7/7.  JMW

## 2012-09-08 ENCOUNTER — Telehealth: Payer: Self-pay | Admitting: Oncology

## 2012-09-13 ENCOUNTER — Encounter (HOSPITAL_BASED_OUTPATIENT_CLINIC_OR_DEPARTMENT_OTHER): Payer: Medicare Other | Attending: General Surgery

## 2012-09-13 DIAGNOSIS — L97909 Non-pressure chronic ulcer of unspecified part of unspecified lower leg with unspecified severity: Secondary | ICD-10-CM | POA: Insufficient documentation

## 2012-09-13 DIAGNOSIS — L97809 Non-pressure chronic ulcer of other part of unspecified lower leg with unspecified severity: Secondary | ICD-10-CM | POA: Insufficient documentation

## 2012-09-13 DIAGNOSIS — I87339 Chronic venous hypertension (idiopathic) with ulcer and inflammation of unspecified lower extremity: Secondary | ICD-10-CM | POA: Insufficient documentation

## 2012-09-18 ENCOUNTER — Other Ambulatory Visit: Payer: Self-pay | Admitting: *Deleted

## 2012-09-24 ENCOUNTER — Other Ambulatory Visit: Payer: Self-pay | Admitting: Internal Medicine

## 2012-09-26 ENCOUNTER — Telehealth: Payer: Self-pay | Admitting: *Deleted

## 2012-09-26 NOTE — Telephone Encounter (Signed)
sw pt gv appts for 10/02/12. Pt is aware that i will mail him a schedule as well...td

## 2012-09-29 ENCOUNTER — Other Ambulatory Visit: Payer: Medicare Other | Admitting: Lab

## 2012-10-02 ENCOUNTER — Other Ambulatory Visit: Payer: Medicare Other

## 2012-10-02 ENCOUNTER — Other Ambulatory Visit: Payer: Medicare Other | Admitting: Lab

## 2012-10-17 ENCOUNTER — Ambulatory Visit: Payer: Medicare Other | Admitting: Hematology and Oncology

## 2012-10-17 ENCOUNTER — Ambulatory Visit: Payer: Self-pay | Admitting: Oncology

## 2012-10-17 ENCOUNTER — Other Ambulatory Visit: Payer: Self-pay | Admitting: Lab

## 2012-10-18 ENCOUNTER — Encounter (HOSPITAL_BASED_OUTPATIENT_CLINIC_OR_DEPARTMENT_OTHER): Payer: Medicare Other | Attending: General Surgery

## 2012-10-18 DIAGNOSIS — L97909 Non-pressure chronic ulcer of unspecified part of unspecified lower leg with unspecified severity: Secondary | ICD-10-CM | POA: Insufficient documentation

## 2012-10-18 DIAGNOSIS — L97809 Non-pressure chronic ulcer of other part of unspecified lower leg with unspecified severity: Secondary | ICD-10-CM | POA: Insufficient documentation

## 2012-10-19 ENCOUNTER — Other Ambulatory Visit: Payer: Self-pay | Admitting: Internal Medicine

## 2012-10-27 ENCOUNTER — Other Ambulatory Visit: Payer: Medicare Other

## 2012-10-27 ENCOUNTER — Ambulatory Visit: Payer: Medicare Other

## 2012-10-30 ENCOUNTER — Telehealth: Payer: Self-pay | Admitting: Hematology and Oncology

## 2012-10-30 ENCOUNTER — Other Ambulatory Visit: Payer: Medicare Other | Admitting: Lab

## 2012-10-30 ENCOUNTER — Telehealth: Payer: Self-pay | Admitting: *Deleted

## 2012-10-30 ENCOUNTER — Ambulatory Visit: Payer: Medicare Other

## 2012-10-30 ENCOUNTER — Other Ambulatory Visit: Payer: Self-pay | Admitting: *Deleted

## 2012-10-30 NOTE — Telephone Encounter (Signed)
Per staff message and POF I have scheduled appts.  JMW  

## 2012-10-30 NOTE — Telephone Encounter (Signed)
pt called to r/s appt....pt could not come to nxt week.Marland KitchenMarland KitchenDone

## 2012-10-30 NOTE — Telephone Encounter (Signed)
Patient called leaving  message that he has car issues and can't make appt. Patient wants to reschedule. Patient has MD appt, message forwarded to the desk RN.   JMW

## 2012-10-31 ENCOUNTER — Ambulatory Visit: Payer: Medicare Other | Admitting: Cardiology

## 2012-11-01 ENCOUNTER — Telehealth: Payer: Self-pay | Admitting: *Deleted

## 2012-11-01 NOTE — Telephone Encounter (Signed)
Ok to use OTC generic neosporin in place of Bactroban as needed (does not come in a larger size!)

## 2012-11-01 NOTE — Telephone Encounter (Signed)
Pt called states the Bactroban Ointment 22g tube is not enough.  Pt states he receives 2 tubes and they last for 2 dressing changes and he is only prescribed 2 a month.  Please advise

## 2012-11-01 NOTE — Telephone Encounter (Signed)
Spoke with pt advised of MDs message 

## 2012-11-08 ENCOUNTER — Ambulatory Visit: Payer: Medicare Other

## 2012-11-08 ENCOUNTER — Other Ambulatory Visit: Payer: Medicare Other | Admitting: Lab

## 2012-11-08 ENCOUNTER — Telehealth: Payer: Self-pay | Admitting: *Deleted

## 2012-11-08 NOTE — Telephone Encounter (Signed)
Patietn called and canceled appts for today. He wants to reschedule to next week. The message forwarded to the desk RN. Patient wants to be called back at [phone].  JMW

## 2012-11-14 ENCOUNTER — Other Ambulatory Visit: Payer: Self-pay | Admitting: Internal Medicine

## 2012-11-15 ENCOUNTER — Other Ambulatory Visit: Payer: Self-pay | Admitting: *Deleted

## 2012-11-15 ENCOUNTER — Telehealth: Payer: Self-pay | Admitting: Internal Medicine

## 2012-11-15 ENCOUNTER — Encounter (HOSPITAL_BASED_OUTPATIENT_CLINIC_OR_DEPARTMENT_OTHER): Payer: Medicare Other | Attending: General Surgery

## 2012-11-15 DIAGNOSIS — I87339 Chronic venous hypertension (idiopathic) with ulcer and inflammation of unspecified lower extremity: Secondary | ICD-10-CM | POA: Insufficient documentation

## 2012-11-15 DIAGNOSIS — L97809 Non-pressure chronic ulcer of other part of unspecified lower leg with unspecified severity: Secondary | ICD-10-CM | POA: Insufficient documentation

## 2012-11-15 MED ORDER — MUPIROCIN 2 % EX OINT: TOPICAL_OINTMENT | CUTANEOUS | Status: DC

## 2012-11-15 MED ORDER — TRAMADOL HCL 50 MG PO TABS: ORAL_TABLET | ORAL | Status: DC

## 2012-11-15 NOTE — Telephone Encounter (Signed)
Pt called to r/s 8/27 f/ua ppt. Not abel to add f/u to 9/15 lb/phleb due to pt restrictions and other appts. F/u added w/10/13 lb/phleb and pt has appt d/t's for 9/15 and 10/13 both @ 10:30am

## 2012-11-15 NOTE — Telephone Encounter (Signed)
Faxed script back to [facility].../lmb 

## 2012-11-24 ENCOUNTER — Other Ambulatory Visit: Payer: Medicare Other | Admitting: Lab

## 2012-11-27 ENCOUNTER — Other Ambulatory Visit: Payer: Medicare Other | Admitting: Lab

## 2012-11-27 ENCOUNTER — Telehealth: Payer: Self-pay | Admitting: *Deleted

## 2012-11-27 NOTE — Telephone Encounter (Signed)
Patient called and moved his appts from today to Wednesday

## 2012-11-28 ENCOUNTER — Other Ambulatory Visit: Payer: Self-pay

## 2012-11-28 DIAGNOSIS — D751 Secondary polycythemia: Secondary | ICD-10-CM

## 2012-11-29 ENCOUNTER — Other Ambulatory Visit: Payer: Self-pay

## 2012-11-29 ENCOUNTER — Other Ambulatory Visit (HOSPITAL_BASED_OUTPATIENT_CLINIC_OR_DEPARTMENT_OTHER): Payer: Medicare Other | Admitting: Lab

## 2012-11-29 DIAGNOSIS — D751 Secondary polycythemia: Secondary | ICD-10-CM

## 2012-11-29 DIAGNOSIS — D45 Polycythemia vera: Secondary | ICD-10-CM

## 2012-11-29 LAB — CBC WITH DIFFERENTIAL/PLATELET
Basophils Absolute: 0 10*3/uL (ref 0.0–0.1)
Eosinophils Absolute: 0.1 10*3/uL (ref 0.0–0.5)
HCT: 44.8 % (ref 38.4–49.9)
HGB: 15 g/dL (ref 13.0–17.1)
LYMPH%: 15.9 % (ref 14.0–49.0)
MCHC: 33.5 g/dL (ref 32.0–36.0)
MONO#: 0.1 10*3/uL (ref 0.1–0.9)
NEUT%: 78 % — ABNORMAL HIGH (ref 39.0–75.0)
Platelets: 323 10*3/uL (ref 140–400)
WBC: 4.1 10*3/uL (ref 4.0–10.3)
lymph#: 0.7 10*3/uL — ABNORMAL LOW (ref 0.9–3.3)

## 2012-12-04 ENCOUNTER — Telehealth: Payer: Self-pay | Admitting: Internal Medicine

## 2012-12-11 ENCOUNTER — Ambulatory Visit: Payer: Medicare Other | Admitting: Internal Medicine

## 2012-12-13 ENCOUNTER — Encounter (HOSPITAL_BASED_OUTPATIENT_CLINIC_OR_DEPARTMENT_OTHER): Payer: Medicare Other | Attending: General Surgery

## 2012-12-13 DIAGNOSIS — L97809 Non-pressure chronic ulcer of other part of unspecified lower leg with unspecified severity: Secondary | ICD-10-CM | POA: Insufficient documentation

## 2012-12-13 DIAGNOSIS — I87339 Chronic venous hypertension (idiopathic) with ulcer and inflammation of unspecified lower extremity: Secondary | ICD-10-CM | POA: Insufficient documentation

## 2012-12-13 DIAGNOSIS — L97909 Non-pressure chronic ulcer of unspecified part of unspecified lower leg with unspecified severity: Secondary | ICD-10-CM | POA: Insufficient documentation

## 2012-12-18 ENCOUNTER — Other Ambulatory Visit (HOSPITAL_COMMUNITY): Payer: Self-pay | Admitting: General Surgery

## 2012-12-18 DIAGNOSIS — I739 Peripheral vascular disease, unspecified: Secondary | ICD-10-CM

## 2012-12-19 ENCOUNTER — Other Ambulatory Visit: Payer: Self-pay | Admitting: Internal Medicine

## 2012-12-19 DIAGNOSIS — D751 Secondary polycythemia: Secondary | ICD-10-CM

## 2012-12-20 ENCOUNTER — Ambulatory Visit (HOSPITAL_BASED_OUTPATIENT_CLINIC_OR_DEPARTMENT_OTHER): Payer: Medicare Other | Admitting: Internal Medicine

## 2012-12-20 ENCOUNTER — Ambulatory Visit: Payer: Medicare Other | Admitting: Internal Medicine

## 2012-12-20 ENCOUNTER — Other Ambulatory Visit: Payer: Medicare Other | Admitting: Lab

## 2012-12-20 ENCOUNTER — Encounter: Payer: Self-pay | Admitting: Internal Medicine

## 2012-12-20 ENCOUNTER — Telehealth: Payer: Self-pay | Admitting: Internal Medicine

## 2012-12-20 ENCOUNTER — Ambulatory Visit (INDEPENDENT_AMBULATORY_CARE_PROVIDER_SITE_OTHER): Payer: Medicare Other | Admitting: Internal Medicine

## 2012-12-20 ENCOUNTER — Other Ambulatory Visit (HOSPITAL_BASED_OUTPATIENT_CLINIC_OR_DEPARTMENT_OTHER): Payer: Medicare Other | Admitting: Lab

## 2012-12-20 ENCOUNTER — Other Ambulatory Visit (INDEPENDENT_AMBULATORY_CARE_PROVIDER_SITE_OTHER): Payer: Medicare Other

## 2012-12-20 VITALS — BP 120/67 | HR 74 | Temp 98.3°F | Resp 19 | Ht 65.5 in | Wt 233.8 lb

## 2012-12-20 VITALS — BP 120/68 | HR 72 | Temp 98.0°F | Wt 233.0 lb

## 2012-12-20 DIAGNOSIS — D45 Polycythemia vera: Secondary | ICD-10-CM

## 2012-12-20 DIAGNOSIS — M545 Low back pain, unspecified: Secondary | ICD-10-CM

## 2012-12-20 DIAGNOSIS — R7309 Other abnormal glucose: Secondary | ICD-10-CM

## 2012-12-20 DIAGNOSIS — I739 Peripheral vascular disease, unspecified: Secondary | ICD-10-CM

## 2012-12-20 DIAGNOSIS — L97913 Non-pressure chronic ulcer of unspecified part of right lower leg with necrosis of muscle: Secondary | ICD-10-CM

## 2012-12-20 DIAGNOSIS — L97909 Non-pressure chronic ulcer of unspecified part of unspecified lower leg with unspecified severity: Secondary | ICD-10-CM | POA: Insufficient documentation

## 2012-12-20 DIAGNOSIS — R739 Hyperglycemia, unspecified: Secondary | ICD-10-CM

## 2012-12-20 DIAGNOSIS — Z23 Encounter for immunization: Secondary | ICD-10-CM

## 2012-12-20 DIAGNOSIS — E785 Hyperlipidemia, unspecified: Secondary | ICD-10-CM

## 2012-12-20 DIAGNOSIS — D751 Secondary polycythemia: Secondary | ICD-10-CM

## 2012-12-20 LAB — CBC WITH DIFFERENTIAL/PLATELET
BASO%: 0.7 % (ref 0.0–2.0)
Basophils Absolute: 0 10*3/uL (ref 0.0–0.1)
EOS%: 2.6 % (ref 0.0–7.0)
HCT: 44.2 % (ref 38.4–49.9)
HGB: 14.9 g/dL (ref 13.0–17.1)
LYMPH%: 14 % (ref 14.0–49.0)
MCH: 36.2 pg — ABNORMAL HIGH (ref 27.2–33.4)
MCHC: 33.7 g/dL (ref 32.0–36.0)
MCV: 107.2 fL — ABNORMAL HIGH (ref 79.3–98.0)
MONO%: 2.5 % (ref 0.0–14.0)
NEUT%: 80.2 % — ABNORMAL HIGH (ref 39.0–75.0)
Platelets: 369 10*3/uL (ref 140–400)
lymph#: 0.9 10*3/uL (ref 0.9–3.3)

## 2012-12-20 LAB — LIPID PANEL
Cholesterol: 133 mg/dL (ref 0–200)
HDL: 44.1 mg/dL (ref >39.00)
Total CHOL/HDL Ratio: 3
Triglycerides: 71 mg/dL (ref 0.0–149.0)

## 2012-12-20 LAB — BASIC METABOLIC PANEL (CC13)
Anion Gap: 9 mEq/L (ref 3–11)
BUN: 11.7 mg/dL (ref 7.0–26.0)
Creatinine: 0.8 mg/dL (ref 0.7–1.3)
Glucose: 97 mg/dl (ref 70–140)
Potassium: 4.4 mEq/L (ref 3.5–5.1)

## 2012-12-20 NOTE — Assessment & Plan Note (Addendum)
Chronic narcotic use to control same symptoms  ESI with [facility] 02/2009 for same -   Also chronic neck pain - prior ACDF reviewed ?myelopathy contributing to gait and balance problems with frequent falls - follows with Dr Yetta Barre NS for same but "not planning surgery until these ulcers heal"

## 2012-12-20 NOTE — Telephone Encounter (Signed)
Gave pt appt for lab and md until 11/12 ok per Felicita Gage, pt prefers Wednesday am only

## 2012-12-20 NOTE — Patient Instructions (Signed)
Polycythemia Vera  Polycythemia Vera is a condition in which the body makes too many red blood cells and there is no known cause. The red blood cells (erythrocytes) are the cells which carry the oxygen in your blood stream to the cells of your body. Because of the increased red blood cells, the blood becomes thicker and does not circulate as well. It would be similar to your car having oil which is too thick so it cannot start and circulate as well. When the blood is too thick it often causes headaches and dizziness. It may also cause blood clots. Even though the blood clots easier, these patients bleed easier. The bleeding is caused because the blood cells which help stop bleeding (platelets) do not function normally. It occurs in all age groups but is more common in the 50 to 70 year age range. TREATMENT  The treatment of polycythemia vera for many years has been blood removal (phlebotomy) which is similar to blood removal in a blood bank, however this blood is not used for donation. Hydroxyurea is used to supplement phlebotomy. Aspirin is commonly given to thin the blood as long as the patient does not have a problem with bleeding. Other drugs are used based on the progression of the disease. Document Released: 11/24/2000 Document Revised: 05/24/2011 Document Reviewed: 05/31/2008 ExitCare Patient Information 2014 [employer]. Hydroxyurea capsules What is this medicine? HYDROXYUREA (hye drox ee yoor EE a) is a chemotherapy drug. It slows the growth of cancer cells. This medicine is used to treat certain leukemias, skin cancer, head and neck cancer, and advanced ovarian cancer. It is also used to control the painful crises of sickle cell anemia. This medicine may be used for other purposes; ask your health care provider or pharmacist if you have questions. What should I tell my health care provider before I take this medicine? They need to know if you have any of these conditions: -immune system  problems -infection (especially a virus infection such as chickenpox, cold sores, or herpes) -kidney disease -low blood counts, like low white cell, platelet, or red cell counts -previous or ongoing radiation therapy -an unusual or allergic reaction to hydroxyurea, other chemotherapy, other medicines, foods, dyes, or preservatives -pregnant or trying to get pregnant -breast-feeding How should I use this medicine? Take this medicine by mouth with a glass of water. Follow the directions on the prescription label. Take your medicine at regular intervals. Do not take it more often than directed. Do not stop taking except on your doctor's advice. People who are not taking this medicine should not be exposed to it. Wash your hands before and after handling your bottle or medicine. Caregivers should wear disposable gloves if they must touch the bottle or medicine. Clean up any medicine powder that spills with a damp disposable towel and throw the towel away in a closed container, such as a plastic bag. Talk to your pediatrician regarding the use of this medicine in children. Special care may be needed. Patients over 65 years old may have a stronger reaction and need a smaller dose. Overdosage: If you think you have taken too much of this medicine contact a poison control center or emergency room at once. NOTE: This medicine is only for you. Do not share this medicine with others. What if I miss a dose? If you miss a dose, take it as soon as you can. If it is almost time for your next dose, take only that dose. Do not take double or   extra doses. What may interact with this medicine? -didanosine -other chemotherapy agents -stavudine -tenofovir -vaccines This list may not describe all possible interactions. Give your health care provider a list of all the medicines, herbs, non-prescription drugs, or dietary supplements you use. Also tell them if you smoke, drink alcohol, or use illegal drugs. Some items  may interact with your medicine. What should I watch for while using this medicine? This drug may make you feel generally unwell. This is not uncommon, as chemotherapy can affect healthy cells as well as cancer cells. Report any side effects. Continue your course of treatment even though you feel ill unless your doctor tells you to stop. You will receive regular blood tests during your treatment. Call your doctor or health care professional for advice if you get a fever, chills or sore throat, or other symptoms of a cold or flu. Do not treat yourself. This drug decreases your body's ability to fight infections. Try to avoid being around people who are sick. This medicine may increase your risk to bruise or bleed. Call your doctor or health care professional if you notice any unusual bleeding. Be careful brushing and flossing your teeth or using a toothpick because you may get an infection or bleed more easily. If you have any dental work done, tell your dentist you are receiving this medicine. Avoid taking products that contain aspirin, acetaminophen, ibuprofen, naproxen, or ketoprofen unless instructed by your doctor. These medicines may hide a fever. Do not become pregnant while taking this medicine. Women should inform their doctor if they wish to become pregnant or think they might be pregnant. There is a potential for serious side effects to an unborn child. Men should inform their doctors if they wish to father a child. This medicine may lower sperm counts. Talk to your health care professional or pharmacist for more information. Do not breast-feed an infant while taking this medicine. What side effects may I notice from receiving this medicine? Side effects that you should report to your doctor or health care professional as soon as possible: -allergic reactions like skin rash, itching or hives, swelling of the face, lips, or tongue -low blood counts - this medicine may decrease the number of white  blood cells, red blood cells and platelets. You may be at increased risk for infections and bleeding. -signs of infection - fever or chills, cough, sore throat, pain or difficulty passing urine -signs of decreased platelets or bleeding - bruising, pinpoint red spots on the skin, black, tarry stools, blood in the urine -signs of decreased red blood cells - unusually weak or tired, fainting spells, lightheadedness -breathing problems -burning, redness or pain at the site of any radiation therapy -changes in skin color -confusion -mouth sores -pain, tingling, numbness in the hands or feet -seizures -skin ulcers -trouble passing urine or change in the amount of urine -vomiting Side effects that usually do not require medical attention (report to your doctor or health care professional if they continue or are bothersome): -headache -loss of appetite -red color to the face This list may not describe all possible side effects. Call your doctor for medical advice about side effects. You may report side effects to FDA at 1-800-FDA-1088. Where should I keep my medicine? Keep out of the reach of children. Store at room temperature between 15 and 30 degrees C (59 and 86 degrees F). Keep tightly closed. Throw away any unused medicine after the expiration date. NOTE: This sheet is a summary. It may not   cover all possible information. If you have questions about this medicine, talk to your doctor, pharmacist, or health care provider.  2013, Elsevier/Gold Standard. (07/14/2007 3:03:29 PM)  

## 2012-12-20 NOTE — Assessment & Plan Note (Signed)
Last lipids reviewed, check annually -  acceptable per cards review 01/2010 changed atorva to prava 01/2012 Encouraged to resume work on diet/lifestyle control of same Lab Results  Component Value Date   LDLCALC 64 08/02/2011

## 2012-12-20 NOTE — Patient Instructions (Signed)
It was good to see you today. We have reviewed your prior records including labs and tests today Medications reviewed and updated, no changes recommended at this time. Refill on medication(s) as discussed today. Test(s) ordered today. Your results will be released to MyChart (or called to you) after review, usually within 72hours after test completion. If any changes need to be made, you will be notified at that same time. Please schedule followup in 6 months, call sooner if problems.

## 2012-12-20 NOTE — Progress Notes (Signed)
  Subjective:    Patient ID: Fernando Lane, male    DOB: 07-28-1950, 62 y.o.   MRN: [id number]  HPI Here for follow up - reviewed chronic medical issues and interval medical events   Past Medical History  Diagnosis Date  . Chronic low back pain   . 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 Chronic neck pain   . OSA (obstructive sleep apnea)     CPAP @ bedtime  . Allergy   . Unspecified essential hypertension     Resistant, 2D Echo - [id number]  . Polycythemia   . CAD (coronary artery disease)     Vessel type unspecified  . PVD (peripheral vascular disease)   . DDD (degenerative disc disease), cervical     Review of Systems  Constitutional: Positive for fatigue. Negative for fever and unexpected weight change.  Respiratory: Negative for cough and shortness of breath.   Cardiovascular: Negative for chest pain and palpitations.  Musculoskeletal: Positive for back pain and gait problem.  Skin: Positive for wound (L knee, RLE - in [facility] clinic since 02/2012). Negative for rash.  Neurological: Negative for dizziness and headaches.  Hematological: Does not bruise/bleed easily.  Psychiatric/Behavioral: Negative for dysphoric mood. The patient is not nervous/anxious.        Objective:   Physical Exam BP 120/68  Pulse 72  Temp(Src) 98 F (36.7 C) (Oral)  Wt 233 lb (105.688 kg)  BMI 38.17 kg/m2  SpO2 96% Wt Readings from Last 3 Encounters:  12/20/12 233 lb (105.688 kg)  12/20/12 233 lb 12.8 oz (106.051 kg)  06/26/12 253 lb 12.8 oz (115.123 kg)   Constitutional: Obese; appears well-developed and well-nourished. No distress.  Neck: Thick. Normal range of motion. Neck supple. No JVD present. No thyromegaly present.  Cardiovascular: Normal rate, regular rhythm and normal heart sounds.  No murmur heard.  Chronic 1+ BLE edema Pulmonary/Chest: Effort normal and breath sounds normal. No respiratory distress. no wheezes.  Skin:  RLE wrapped  and not examined today -L knee with chronic stage 1 ulceration over patella - 5mm round, no eschar or infection Psychiatric: he has a dysphoric mood and affect. behavior is normal. Judgment and thought content normal.   Lab Results  Component Value Date   WBC 6.2 12/20/2012   HGB 14.9 12/20/2012   HCT 44.2 12/20/2012   PLT 369 12/20/2012   CHOL 134 08/02/2011   TRIG 117.0 08/02/2011   HDL 47.10 08/02/2011   ALT 46 08/04/2012   AST 20 08/04/2012   NA 138 12/20/2012   K 4.4 12/20/2012   CL 108* 08/04/2012   CREATININE 0.8 12/20/2012   BUN 11.7 12/20/2012   CO2 28 12/20/2012   TSH 2.01 07/30/2009   HGBA1C 5.2 11/19/2011       Assessment & Plan:   see problem list. Medications and labs reviewed today.  Reported hyperglycemia at wound care clinic - random check 58 -  Will check A1c now to exclude diabetes

## 2012-12-20 NOTE — Assessment & Plan Note (Signed)
RLE open leg wounds since 10/2011 -  Wound care clinic mgmt reviewed - Dr Jimmey Ralph - weekly OV Recent change ampicillin to doxy antibiotics given poor response to I&D Heme to DC hydroxyurea due to possible side effects causing ulcers wound care ongoing with [facility] clinic since 02/15/12 continue pain mgmt - same as ongoing for chronic back and neck pain symptoms

## 2012-12-20 NOTE — Progress Notes (Signed)
[facility] OFFICE PROGRESS NOTE  [name], MD [address] [place] [address]  DIAGNOSIS: POLYCYTHEMIA - Plan: CBC with Differential, CBC with Differential, CBC with Differential in 1 month, Comprehensive metabolic panel  Morbid obesity - Plan: CBC with Differential, CBC with Differential, CBC with Differential in 1 month, Comprehensive metabolic panel  Leg ulcer, right, with necrosis of muscle  PERIPHERAL VASCULAR DISEASE  Chief Complaint  Patient presents with  . Polycythemia    CURRENT THERAPY:  Hydrea 500 mg alternating with 1000 mg  INTERVAL HISTORY: Fernando Lane 62 y.o. male with a history of polycythemia vera presents for followup. He was last seen by Dr. Dalene Carrow. On 01/18/2012. At time he was having ongoing skin infections to his right lower extremity and being followed by home center reports that he has been on multiple antibiotics including ampicillin, doxycycline, clindamycin without resolution. When this time he reports he is always taking Hydrea. He remains afebrile he denies any chills. Also denies mucositis or any GI complaints such as diarrhea. His last phlebotomy was 2 months ago. He also reports that he had a lower extremity vascular study done that was reported to be within normal limits. He is scheduled to see the wound center today. He denies any history of strokes or myocardial infarctions or blood clots. He has his complete blood count checked every month and is generally followed every 6 months. His last phlebotomy was 2 months ago.  MEDICAL HISTORY: Past Medical History  Diagnosis Date  . Chronic low back pain   . 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 Chronic neck pain   . OSA (obstructive sleep apnea)     CPAP @ bedtime  . Allergy   . Unspecified essential hypertension     Resistant, 2D Echo - [id number]  . Polycythemia   . CAD (coronary artery disease)      Vessel type unspecified  . PVD (peripheral vascular disease)   . DDD (degenerative disc disease), cervical     INTERIM HISTORY: has HYPERLIPIDEMIA-MIXED; MORBID OBESITY; POLYCYTHEMIA; OBSTRUCTIVE SLEEP APNEA; HYPERTENSION; CORONARY ARTERY DISEASE, VESSEL TYPE UNSPECIFIED; PERIPHERAL VASCULAR DISEASE; ERECTILE DYSFUNCTION, ORGANIC; DEGENERATIVE DISC DISEASE; LOW BACK PAIN; ABRASION, KNEE, LEFT; Depression; and Leg ulcer on his problem list.    ALLERGIES:  has No Known Allergies.  MEDICATIONS: has a current medication list which includes the following prescription(s): aspirin, doxycycline, hydrocodone-acetaminophen, meclizine, mupirocin ointment, tramadol, cyclobenzaprine, duloxetine, lisinopril-hydrochlorothiazide, metoprolol succinate, naproxen, pravastatin, and sildenafil.  SURGICAL HISTORY:  Past Surgical History  Procedure Laterality Date  . Lower extrmity doppler      [place] 2008; no evidence for PAD  . US echocardiography  11/2008    Normal valves, mild LAE  . Left knee replacement  10/2006  . Cervical spine surgery  02/2008  . Joint replacement    . Spine surgery      REVIEW OF SYSTEMS:   Constitutional: Denies fevers, chills or abnormal weight loss Eyes: Denies blurriness of vision Ears, nose, mouth, throat, and face: Denies mucositis or sore throat Respiratory: Denies cough, dyspnea or wheezes Cardiovascular: Denies palpitation, chest discomfort or lower extremity swelling Gastrointestinal:  Denies nausea, heartburn or change in bowel habits Skin: Denies abnormal skin rashes Lymphatics: Denies new lymphadenopathy or easy bruising Neurological:Denies numbness, tingling or new weaknesses Behavioral/Psych: Mood is stable, no new changes  All other systems were reviewed with the patient and are negative.  PHYSICAL EXAMINATION: ECOG  PERFORMANCE STATUS: 1 - Symptomatic but completely ambulatory  Blood pressure 120/67, pulse 74, temperature 98.3 F (36.8 C), temperature  source Oral, resp. rate 19, height 5' 5.5" (1.664 m), weight 233 lb 12.8 oz (106.051 kg), SpO2 99.00%.  GENERAL:alert, no distress and comfortable; moderately obese gentleman SKIN: skin color, texture, turgor are normal, no rashes or significant lesions EYES: normal, Conjunctiva are pink and non-injected, sclera clear OROPHARYNX:no exudate, no erythema and lips, buccal mucosa, and tongue normal  NECK: supple, thyroid normal size, non-tender, without nodularity LYMPH:  no palpable lymphadenopathy in the cervical, axillary or supraclavicular LUNGS: clear to auscultation and percussion with normal breathing effort HEART: regular rate & rhythm and no murmurs and no lower extremity edema ABDOMEN:abdomen soft, non-tender and normal bowel sounds Musculoskeletal:no cyanosis of digits and no clubbing  NEURO: alert & oriented x 3 with fluent speech, no focal motor/sensory deficits EXTREMITIES: One large ulcer on the right lateral surface of his right lower extremity covered with dressing; one smaller half a dollar sized ulcer in the medial surface of his right lower extremity.   LABORATORY DATA: Results for orders placed in visit on 12/20/12 (from the past 48 hour(s))  CBC WITH DIFFERENTIAL     Status: Abnormal   Collection Time    12/20/12  9:23 AM      Result Value Range   WBC 6.2  4.0 - 10.3 10e3/uL   NEUT# 5.0  1.5 - 6.5 10e3/uL   HGB 14.9  13.0 - 17.1 g/dL   HCT 11.9  14.7 - 82.9 %   Platelets 369  140 - 400 10e3/uL   MCV 107.2 (*) 79.3 - 98.0 fL   MCH 36.2 (*) 27.2 - 33.4 pg   MCHC 33.7  32.0 - 36.0 g/dL   RBC 5.62 (*) 1.30 - 8.65 10e6/uL   RDW 18.3 (*) 11.0 - 14.6 %   lymph# 0.9  0.9 - 3.3 10e3/uL   MONO# 0.2  0.1 - 0.9 10e3/uL   Eosinophils Absolute 0.2  0.0 - 0.5 10e3/uL   Basophils Absolute 0.0  0.0 - 0.1 10e3/uL   NEUT% 80.2 (*) 39.0 - 75.0 %   LYMPH% 14.0  14.0 - 49.0 %   MONO% 2.5  0.0 - 14.0 %   EOS% 2.6  0.0 - 7.0 %   BASO% 0.7  0.0 - 2.0 %  FERRITIN [facility]     Status: None    Collection Time    12/20/12  9:23 AM      Result Value Range   Ferritin 155  22 - 316 ng/ml  BASIC METABOLIC PANEL ([facility])     Status: None   Collection Time    12/20/12  9:24 AM      Result Value Range   Sodium 138  136 - 145 mEq/L   Potassium 4.4  3.5 - 5.1 mEq/L   Chloride 101  98 - 109 mEq/L   CO2 28  22 - 29 mEq/L   Glucose 97  70 - 140 mg/dl   BUN 78.4  7.0 - 69.6 mg/dL   Creatinine 0.8  0.7 - 1.3 mg/dL   Calcium 9.3  8.4 - 29.5 mg/dL   Anion Gap 9  3 - 11 mEq/L       Labs:  Lab Results  Component Value Date   WBC 6.2 12/20/2012   HGB 14.9 12/20/2012   HCT 44.2 12/20/2012   MCV 107.2* 12/20/2012   PLT 369 12/20/2012   NEUTROABS 5.0 12/20/2012  Chemistry      Component Value Date/Time   NA 138 12/20/2012 0924   NA 135 06/02/2011 1020   K 4.4 12/20/2012 0924   K 5.4* 06/02/2011 1020   CL 108* 08/04/2012 1302   CL 100 06/02/2011 1020   CO2 28 12/20/2012 0924   CO2 26 06/02/2011 1020   BUN 11.7 12/20/2012 0924   BUN 12 06/02/2011 1020   CREATININE 0.8 12/20/2012 0924   CREATININE 0.87 06/02/2011 1020      Component Value Date/Time   CALCIUM 9.3 12/20/2012 0924   CALCIUM 9.2 06/02/2011 1020   ALKPHOS 100 08/04/2012 1302   ALKPHOS 55 08/02/2011 0919   AST 20 08/04/2012 1302   AST 23 08/02/2011 0919   ALT 46 08/04/2012 1302   ALT 34 08/02/2011 0919   BILITOT 0.86 08/04/2012 1302   BILITOT 0.9 08/02/2011 0919      CBC:  Recent Labs Lab 12/20/12 0923  WBC 6.2  NEUTROABS 5.0  HGB 14.9  HCT 44.2  MCV 107.2*  PLT 369    Recent Labs  12/20/12 0923  FERRITIN 155    RADIOGRAPHIC STUDIES: No results found.  ASSESSMENT: Fernando Lane 62 y.o. male with a history of POLYCYTHEMIA - Plan: CBC with Differential, CBC with Differential, CBC with Differential in 1 month, Comprehensive metabolic panel  Morbid obesity - Plan: CBC with Differential, CBC with Differential, CBC with Differential in 1 month, Comprehensive metabolic panel  Leg ulcer, right, with necrosis of  muscle  PERIPHERAL VASCULAR DISEASE   PLAN:  1. Polycythemia vera --Patient counseled to stop Hydrea given #2. He will continue phlebotomy to maintain hematocrit less than 45% and his daily aspirin 81 mg.  He was advised  on symptoms of polycythemia including blood clots, strokes, myocardial infarction. Given the persistence of his leg ulcers and the risk of further infection which could be limb threatening, he agreed to discontinue the Hydrea. He will have his CBC checked biweekly and he will followup for CBC and chemistries in one month.  2. Multiple chronic R leg ulcers. -- His chronic leg ulcers can in part be explained by hydroxyurea. Other considerations include diabetes and or peripheral vascular disease. However, he reports normal vascular studies.   Some patients on hydroxyurea for greater than 5 years had developed chronic leg ulcers despite no underlying vascular disease (Arch Dermatology. 1999 Jul; 135(7):818-20.  In this retrospective multi-institutional study,  41 patients developed chronic leg ulcers while on Hydrea,  80% of them had complete resolution upon discontinuation of hydroxyurea.  As a result, I feel we should stop Hydrea.  We will continue to support wound care to the effected areas.  We will continue close monitoring for #1 with phlebotomy to maintain a hematocrit less than 45% and aspirin 81 mg daily.  He will check CBCs every 2 weeks.   All questions were answered. The patient knows to call the clinic with any problems, questions or concerns. We can certainly see the patient much sooner if necessary. He was provided a handout on polycythemia and Hydrea-- which we are now discontinuing. He was also provided and after visit summary. He will followup in one month for symptom visit.   I spent 15 minutes counseling the patient face to face. The total time spent in the appointment was 25 minutes.    Sharlie Shreffler, MD 12/21/2012 4:08 PM

## 2012-12-21 ENCOUNTER — Other Ambulatory Visit: Payer: Self-pay | Admitting: *Deleted

## 2012-12-21 DIAGNOSIS — M545 Low back pain: Secondary | ICD-10-CM

## 2012-12-21 MED ORDER — DULOXETINE HCL 30 MG PO CPEP: 30.0000 mg | ORAL_CAPSULE | Freq: Every day | ORAL | Status: DC

## 2012-12-21 MED ORDER — NAPROXEN 500 MG PO TABS: ORAL_TABLET | ORAL | Status: DC

## 2012-12-21 MED ORDER — SILDENAFIL CITRATE 100 MG PO TABS: 50.0000 mg | ORAL_TABLET | Freq: Every day | ORAL | Status: DC | PRN

## 2012-12-21 MED ORDER — METOPROLOL SUCCINATE ER 25 MG PO TB24: 25.0000 mg | ORAL_TABLET | Freq: Every day | ORAL | Status: DC

## 2012-12-21 MED ORDER — PRAVASTATIN SODIUM 20 MG PO TABS: 20.0000 mg | ORAL_TABLET | Freq: Every day | ORAL | Status: DC

## 2012-12-21 MED ORDER — CYCLOBENZAPRINE HCL 5 MG PO TABS: 5.0000 mg | ORAL_TABLET | Freq: Three times a day (TID) | ORAL | Status: DC | PRN

## 2012-12-21 MED ORDER — LISINOPRIL-HYDROCHLOROTHIAZIDE 20-12.5 MG PO TABS: 1.0000 | ORAL_TABLET | Freq: Every day | ORAL | Status: DC

## 2012-12-21 NOTE — Telephone Encounter (Signed)
Pt states he is needing refills on all his medicine. He has change pharmacy again. Pt is using [facility] now. Inform pt will have to pick hydrocodone script up due to state changes...lmb

## 2012-12-22 ENCOUNTER — Other Ambulatory Visit: Payer: Medicare Other | Admitting: Lab

## 2012-12-22 MED ORDER — TRAMADOL HCL 50 MG PO TABS: ORAL_TABLET | ORAL | Status: DC

## 2012-12-22 MED ORDER — HYDROCODONE-ACETAMINOPHEN 7.5-325 MG PO TABS: 1.0000 | ORAL_TABLET | Freq: Three times a day (TID) | ORAL | Status: DC | PRN

## 2012-12-22 NOTE — Telephone Encounter (Signed)
Printed & signed for pickup

## 2012-12-22 NOTE — Telephone Encounter (Signed)
Notified pt rx ready for pick-up.../lmb 

## 2012-12-22 NOTE — Telephone Encounter (Signed)
Tried calling pt no answer can't leave msg due to vm not being set-up.Marland Kitchenlmb

## 2012-12-25 ENCOUNTER — Other Ambulatory Visit: Payer: Medicare Other | Admitting: Lab

## 2012-12-25 ENCOUNTER — Ambulatory Visit: Payer: Medicare Other

## 2012-12-25 ENCOUNTER — Other Ambulatory Visit: Payer: Self-pay | Admitting: *Deleted

## 2012-12-25 ENCOUNTER — Telehealth: Payer: Self-pay | Admitting: *Deleted

## 2012-12-25 ENCOUNTER — Telehealth: Payer: Self-pay

## 2012-12-25 NOTE — Telephone Encounter (Signed)
Pt called to let us know that he is having no side effects, no leg swelling. He stopped his hydrea per Dr Rosie Fate

## 2012-12-25 NOTE — Telephone Encounter (Signed)
I have moved appt from Monday to Wednesday. Patient request and MD order

## 2012-12-27 ENCOUNTER — Encounter (HOSPITAL_COMMUNITY): Payer: Medicare Other

## 2012-12-27 ENCOUNTER — Other Ambulatory Visit: Payer: Medicare Other

## 2012-12-27 ENCOUNTER — Telehealth: Payer: Self-pay | Admitting: Internal Medicine

## 2012-12-27 NOTE — Telephone Encounter (Signed)
pt called to r/s appts to tomorrow...lights are out and alarm clock did not come on.  done

## 2012-12-28 ENCOUNTER — Encounter (HOSPITAL_COMMUNITY): Payer: Self-pay | Admitting: *Deleted

## 2012-12-28 ENCOUNTER — Other Ambulatory Visit: Payer: Medicare Other

## 2013-01-03 ENCOUNTER — Other Ambulatory Visit: Payer: Self-pay | Admitting: Internal Medicine

## 2013-01-03 ENCOUNTER — Other Ambulatory Visit (HOSPITAL_BASED_OUTPATIENT_CLINIC_OR_DEPARTMENT_OTHER): Payer: Medicare Other | Admitting: Lab

## 2013-01-03 ENCOUNTER — Inpatient Hospital Stay (HOSPITAL_COMMUNITY): Admission: RE | Admit: 2013-01-03 | Payer: Medicare Other | Source: Ambulatory Visit

## 2013-01-03 ENCOUNTER — Telehealth: Payer: Self-pay | Admitting: Internal Medicine

## 2013-01-03 DIAGNOSIS — D751 Secondary polycythemia: Secondary | ICD-10-CM

## 2013-01-03 DIAGNOSIS — D45 Polycythemia vera: Secondary | ICD-10-CM

## 2013-01-03 LAB — CBC WITH DIFFERENTIAL/PLATELET
BASO%: 0.9 % (ref 0.0–2.0)
Basophils Absolute: 0.1 10*3/uL (ref 0.0–0.1)
EOS%: 2.8 % (ref 0.0–7.0)
HCT: 41.3 % (ref 38.4–49.9)
HGB: 14 g/dL (ref 13.0–17.1)
LYMPH%: 9.3 % — ABNORMAL LOW (ref 14.0–49.0)
MCH: 36.2 pg — ABNORMAL HIGH (ref 27.2–33.4)
MCHC: 34 g/dL (ref 32.0–36.0)
MCV: 106.6 fL — ABNORMAL HIGH (ref 79.3–98.0)
MONO%: 3.1 % (ref 0.0–14.0)
NEUT%: 83.9 % — ABNORMAL HIGH (ref 39.0–75.0)
Platelets: 533 10*3/uL — ABNORMAL HIGH (ref 140–400)
RDW: 17.6 % — ABNORMAL HIGH (ref 11.0–14.6)

## 2013-01-03 MED ORDER — ANAGRELIDE HCL 0.5 MG PO CAPS: 1.0000 mg | ORAL_CAPSULE | Freq: Two times a day (BID) | ORAL | Status: DC

## 2013-01-03 NOTE — Telephone Encounter (Signed)
I saw patient briefly following his labs.  His hematocrit remains at 41.3 today.  He has been off hydrea with mild resolution of his leg ulcers.  We started him on anagrelide 1mg  bid.   He was advised of the common side effects including headache, palpitations, dyspnea, edema and nausea.  It can bleeding (with aspirin) and arrhythmias and liver toxicity rarely.   We will continue closely monitoring his labs.

## 2013-01-05 ENCOUNTER — Telehealth (HOSPITAL_COMMUNITY): Payer: Self-pay | Admitting: *Deleted

## 2013-01-05 ENCOUNTER — Ambulatory Visit: Payer: Medicare Other | Admitting: Cardiology

## 2013-01-08 ENCOUNTER — Ambulatory Visit: Payer: Medicare Other | Admitting: Internal Medicine

## 2013-01-09 ENCOUNTER — Ambulatory Visit: Payer: Medicare Other | Admitting: Cardiology

## 2013-01-10 ENCOUNTER — Other Ambulatory Visit: Payer: Medicare Other | Admitting: Lab

## 2013-01-10 ENCOUNTER — Ambulatory Visit: Payer: Medicare Other | Admitting: Physician Assistant

## 2013-01-10 ENCOUNTER — Telehealth: Payer: Self-pay | Admitting: Internal Medicine

## 2013-01-10 NOTE — Telephone Encounter (Signed)
pt called to r/s appt to tomorrow due to car trouble.

## 2013-01-11 ENCOUNTER — Other Ambulatory Visit: Payer: Medicare Other

## 2013-01-11 ENCOUNTER — Telehealth: Payer: Self-pay | Admitting: Internal Medicine

## 2013-01-11 NOTE — Telephone Encounter (Signed)
Pt called and r/s lab from today to tomorrow

## 2013-01-12 ENCOUNTER — Other Ambulatory Visit: Payer: Medicare Other | Admitting: Lab

## 2013-01-12 ENCOUNTER — Telehealth: Payer: Self-pay

## 2013-01-12 NOTE — Telephone Encounter (Signed)
Fernando Lane stated that is car is in the shop and cannot make lab appointment today.  He is scheduled for lab 01-17-13 and will keep that appointment if his car is ready.

## 2013-01-17 ENCOUNTER — Telehealth: Payer: Self-pay

## 2013-01-17 ENCOUNTER — Encounter: Payer: Self-pay | Admitting: Internal Medicine

## 2013-01-17 ENCOUNTER — Other Ambulatory Visit (HOSPITAL_BASED_OUTPATIENT_CLINIC_OR_DEPARTMENT_OTHER): Payer: Medicare Other | Admitting: Lab

## 2013-01-17 ENCOUNTER — Encounter (HOSPITAL_BASED_OUTPATIENT_CLINIC_OR_DEPARTMENT_OTHER): Payer: Medicare Other | Attending: General Surgery

## 2013-01-17 DIAGNOSIS — D45 Polycythemia vera: Secondary | ICD-10-CM

## 2013-01-17 DIAGNOSIS — L97809 Non-pressure chronic ulcer of other part of unspecified lower leg with unspecified severity: Secondary | ICD-10-CM | POA: Insufficient documentation

## 2013-01-17 DIAGNOSIS — D751 Secondary polycythemia: Secondary | ICD-10-CM

## 2013-01-17 DIAGNOSIS — I87319 Chronic venous hypertension (idiopathic) with ulcer of unspecified lower extremity: Secondary | ICD-10-CM | POA: Insufficient documentation

## 2013-01-17 DIAGNOSIS — L97909 Non-pressure chronic ulcer of unspecified part of unspecified lower leg with unspecified severity: Secondary | ICD-10-CM | POA: Insufficient documentation

## 2013-01-17 LAB — CBC WITH DIFFERENTIAL/PLATELET
Eosinophils Absolute: 0.3 10*3/uL (ref 0.0–0.5)
HCT: 42.6 % (ref 38.4–49.9)
HGB: 13.8 g/dL (ref 13.0–17.1)
LYMPH%: 10.1 % — ABNORMAL LOW (ref 14.0–49.0)
MCH: 34.4 pg — ABNORMAL HIGH (ref 27.2–33.4)
MCHC: 32.5 g/dL (ref 32.0–36.0)
MONO#: 0.2 10*3/uL (ref 0.1–0.9)
NEUT#: 9.6 10*3/uL — ABNORMAL HIGH (ref 1.5–6.5)
NEUT%: 84 % — ABNORMAL HIGH (ref 39.0–75.0)
Platelets: 782 10*3/uL — ABNORMAL HIGH (ref 140–400)
RBC: 4.02 10*6/uL — ABNORMAL LOW (ref 4.20–5.82)
RDW: 17.1 % — ABNORMAL HIGH (ref 11.0–14.6)
lymph#: 1.2 10*3/uL (ref 0.9–3.3)

## 2013-01-17 LAB — COMPREHENSIVE METABOLIC PANEL (CC13)
ALT: 20 U/L (ref 0–55)
AST: 16 U/L (ref 5–34)
Albumin: 3.4 g/dL — ABNORMAL LOW (ref 3.5–5.0)
Anion Gap: 10 mEq/L (ref 3–11)
CO2: 24 mEq/L (ref 22–29)
Calcium: 10 mg/dL (ref 8.4–10.4)
Chloride: 104 mEq/L (ref 98–109)
Glucose: 93 mg/dl (ref 70–140)
Potassium: 4.7 mEq/L (ref 3.5–5.1)
Sodium: 139 mEq/L (ref 136–145)
Total Bilirubin: 0.47 mg/dL (ref 0.20–1.20)
Total Protein: 7.3 g/dL (ref 6.4–8.3)

## 2013-01-17 NOTE — Telephone Encounter (Signed)
Pt hematocrit goal is <45, today's hematocrit is 42.6. Pt does not need phlebotomy today. Fernando Lane will tell pt.

## 2013-01-17 NOTE — Progress Notes (Signed)
Patient came in to get an application for asst. He is not sure what his balance is with [facility] and I gave him application and ph to call and check. He knows need proof of income and bank statements for app

## 2013-01-17 NOTE — Telephone Encounter (Signed)
Returning pt call, he still has his anagrelide Rx in hand. He is having hard time finding pharmacy who will fill it. Told him to go to [facility] outpatient pharmacy. He said he will go by next Wed. He says he only has $2 in his pocket until payday on 19th. Dr Rosie Fate informed. Message left at financial counselors to see if there is any assistance for him.

## 2013-01-18 ENCOUNTER — Telehealth: Payer: Self-pay | Admitting: *Deleted

## 2013-01-18 DIAGNOSIS — IMO0002 Reserved for concepts with insufficient information to code with codable children: Secondary | ICD-10-CM

## 2013-01-18 DIAGNOSIS — I739 Peripheral vascular disease, unspecified: Secondary | ICD-10-CM

## 2013-01-18 DIAGNOSIS — M545 Low back pain: Secondary | ICD-10-CM

## 2013-01-18 DIAGNOSIS — L97909 Non-pressure chronic ulcer of unspecified part of unspecified lower leg with unspecified severity: Secondary | ICD-10-CM

## 2013-01-18 NOTE — Telephone Encounter (Signed)
Pt called requesting Wheel Chair Rx.  Please advise

## 2013-01-19 MED ORDER — HYDROCODONE-ACETAMINOPHEN 7.5-325 MG PO TABS: 1.0000 | ORAL_TABLET | Freq: Three times a day (TID) | ORAL | Status: DC | PRN

## 2013-01-19 NOTE — Telephone Encounter (Signed)
Ok to generate rx and i will sign Refill done

## 2013-01-19 NOTE — Telephone Encounter (Signed)
Spoke with pt advised Rx ready for pick up 

## 2013-01-19 NOTE — Telephone Encounter (Signed)
Will ask HH to assess for same

## 2013-01-19 NOTE — Telephone Encounter (Signed)
Pt requests hard copy of DME for Wheelchair to take to [place] or less expensive company.  Pt also requests Hydrocodone refill

## 2013-01-22 ENCOUNTER — Encounter: Payer: Self-pay | Admitting: Internal Medicine

## 2013-01-22 NOTE — Progress Notes (Signed)
Patient left a message about financial application. I called him back and left a message with [phone] to call with his question.

## 2013-01-23 ENCOUNTER — Telehealth: Payer: Self-pay | Admitting: Internal Medicine

## 2013-01-23 NOTE — Telephone Encounter (Signed)
Pt request phone call from the assistant concern about some med that need to be send into drug store (its not there) and he has a question about uropathy. Please give pt a call.

## 2013-01-23 NOTE — Telephone Encounter (Signed)
Spoke with pt - answered his questions

## 2013-01-24 ENCOUNTER — Ambulatory Visit (HOSPITAL_BASED_OUTPATIENT_CLINIC_OR_DEPARTMENT_OTHER): Payer: Medicare Other | Admitting: Internal Medicine

## 2013-01-24 ENCOUNTER — Ambulatory Visit (INDEPENDENT_AMBULATORY_CARE_PROVIDER_SITE_OTHER): Payer: Medicare Other | Admitting: Physician Assistant

## 2013-01-24 ENCOUNTER — Encounter: Payer: Self-pay | Admitting: Physician Assistant

## 2013-01-24 ENCOUNTER — Other Ambulatory Visit (HOSPITAL_BASED_OUTPATIENT_CLINIC_OR_DEPARTMENT_OTHER): Payer: Medicare Other | Admitting: Lab

## 2013-01-24 ENCOUNTER — Telehealth: Payer: Self-pay | Admitting: Internal Medicine

## 2013-01-24 ENCOUNTER — Ambulatory Visit: Payer: Medicare Other | Admitting: Oncology

## 2013-01-24 ENCOUNTER — Encounter: Payer: Self-pay | Admitting: Internal Medicine

## 2013-01-24 ENCOUNTER — Ambulatory Visit: Payer: Medicare Other

## 2013-01-24 VITALS — BP 138/80 | HR 70 | Temp 97.0°F | Resp 18 | Ht 65.5 in | Wt 230.0 lb

## 2013-01-24 VITALS — BP 125/72 | HR 69 | Ht 65.56 in | Wt 230.0 lb

## 2013-01-24 DIAGNOSIS — D751 Secondary polycythemia: Secondary | ICD-10-CM

## 2013-01-24 DIAGNOSIS — E785 Hyperlipidemia, unspecified: Secondary | ICD-10-CM

## 2013-01-24 DIAGNOSIS — L97909 Non-pressure chronic ulcer of unspecified part of unspecified lower leg with unspecified severity: Secondary | ICD-10-CM

## 2013-01-24 DIAGNOSIS — D45 Polycythemia vera: Secondary | ICD-10-CM

## 2013-01-24 DIAGNOSIS — I10 Essential (primary) hypertension: Secondary | ICD-10-CM

## 2013-01-24 LAB — CBC WITH DIFFERENTIAL/PLATELET
BASO%: 0.9 % (ref 0.0–2.0)
EOS%: 4.3 % (ref 0.0–7.0)
Eosinophils Absolute: 0.4 10*3/uL (ref 0.0–0.5)
HCT: 41.6 % (ref 38.4–49.9)
HGB: 13.6 g/dL (ref 13.0–17.1)
LYMPH%: 12 % — ABNORMAL LOW (ref 14.0–49.0)
MCH: 34.3 pg — ABNORMAL HIGH (ref 27.2–33.4)
MCHC: 32.7 g/dL (ref 32.0–36.0)
MCV: 104.7 fL — ABNORMAL HIGH (ref 79.3–98.0)
MONO%: 1.5 % (ref 0.0–14.0)
NEUT%: 81.3 % — ABNORMAL HIGH (ref 39.0–75.0)
RBC: 3.98 10*6/uL — ABNORMAL LOW (ref 4.20–5.82)
WBC: 10.5 10*3/uL — ABNORMAL HIGH (ref 4.0–10.3)
lymph#: 1.3 10*3/uL (ref 0.9–3.3)

## 2013-01-24 NOTE — Assessment & Plan Note (Signed)
Patient is having phlebotomy once a week because he had a reaction to the [facility] which had to be stopped he is now on anagrelide.

## 2013-01-24 NOTE — Assessment & Plan Note (Signed)
Results for JEAN, SKOW (MRN [id number]) as of 01/24/2013 12:25  Ref. Range 12/20/2012 15:43  Cholesterol Latest Range: 0-200 mg/dL 696  Triglycerides Latest Range: 0.0-149.0 mg/dL 29.5  HDL Latest Range: >39.00 mg/dL 28.41  LDL (calc) Latest Range: 0-99 mg/dL 75  VLDL Latest Range: 0.0-40.0 mg/dL 32.4  Total CHOL/HDL Ratio No range found 3  Lipid panel stable, I will not make any changes

## 2013-01-24 NOTE — Progress Notes (Signed)
[facility] OFFICE PROGRESS NOTE  [name], MD [address] [place] [address]  DIAGNOSIS: Leg ulcer, unspecified laterality, with unspecified severity  POLYCYTHEMIA - Plan: CBC with Differential in 1 month, CBC with Differential, CBC with Differential, CBC with Differential, Comprehensive metabolic panel  Chief Complaint  Patient presents with  . Polycythemia    CURRENT THERAPY:  Hydrea 500 mg alternating with 1000 mg (stopped on 12/20/12) due to severe leg ulcers.  Anagrelide 0.5 mg bid prescribed.  Aspirin 81 mg daily.   INTERVAL HISTORY: Fernando Lane 62 y.o. male with a history of polycythemia vera presents for followup. He was last seen by me on 12/20/2012. Since 01/18/2012,  he has been having ongoing skin infections to his right lower extremity and being followed by wound center reports that he has been on multiple antibiotics including ampicillin, doxycycline, clindamycin without resolution. During this time he reports he is always taking Hydrea. He remains afebrile he denies any chills. Also denies mucositis or any GI complaints such as diarrhea. His last phlebotomy was 3 months ago. He also reports that he had a lower extremity vascular study done that was reported to be within normal limits.   Today, he reports significant improvement in his leg ulcers since stopping hydrea.  The areas have healed about 50-60% per the patient.  He has not started the anagrelide due to financial limitations.  He denies any chest pain, acute shortness of breath.   MEDICAL HISTORY: Past Medical History  Diagnosis Date  . Chronic low back pain   . 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 Chronic neck pain   . OSA (obstructive sleep apnea)     CPAP @ bedtime  . Allergy   . Unspecified essential hypertension     Resistant, 2D Echo - EF-55-60  . Polycythemia   . CAD (coronary artery disease)      Vessel type unspecified  . PVD (peripheral vascular disease)   . DDD (degenerative disc disease), cervical     INTERIM HISTORY: has HYPERLIPIDEMIA-MIXED; MORBID OBESITY; POLYCYTHEMIA; OBSTRUCTIVE SLEEP APNEA; HYPERTENSION; CORONARY ARTERY DISEASE, VESSEL TYPE UNSPECIFIED; PERIPHERAL VASCULAR DISEASE; ERECTILE DYSFUNCTION, ORGANIC; DEGENERATIVE DISC DISEASE; LOW BACK PAIN; ABRASION, KNEE, LEFT; Depression; and Leg ulcer on his problem list.    ALLERGIES:  has No Known Allergies.  MEDICATIONS: has a current medication list which includes the following prescription(s): aspirin, cyclobenzaprine, duloxetine, hydrocodone-acetaminophen, lisinopril-hydrochlorothiazide, meclizine, metoprolol succinate, mupirocin ointment, naproxen, pravastatin, tramadol, and anagrelide.  SURGICAL HISTORY:  Past Surgical History  Procedure Laterality Date  . Lower extrmity doppler      [place] 2008; no evidence for PAD  . US echocardiography  11/2008    Normal valves, mild LAE  . Left knee replacement  10/2006  . Cervical spine surgery  02/2008  . Joint replacement    . Spine surgery      REVIEW OF SYSTEMS:   Constitutional: Denies fevers, chills or abnormal weight loss Eyes: Denies blurriness of vision Ears, nose, mouth, throat, and face: Denies mucositis or sore throat Respiratory: Denies cough, dyspnea or wheezes Cardiovascular: Denies palpitation, chest discomfort or lower extremity swelling Gastrointestinal:  Denies nausea, heartburn or change in bowel habits Skin: Denies abnormal skin rashes Lymphatics: Denies new lymphadenopathy or easy bruising Neurological:Denies numbness, tingling or new weaknesses Behavioral/Psych: Mood is stable, no new changes  All other systems were reviewed with the patient and are negative.  PHYSICAL EXAMINATION:  ECOG PERFORMANCE STATUS: 1 - Symptomatic but completely ambulatory  Blood pressure 138/80, pulse 70, temperature 97 F (36.1 C), temperature source Oral, resp.  rate 18, height 5' 5.5" (1.664 m), weight 230 lb (104.327 kg).  GENERAL:alert, no distress and comfortable; moderately obese gentleman SKIN: skin color, texture, turgor are normal, no rashes or significant lesions EYES: normal, Conjunctiva are pink and non-injected, sclera clear OROPHARYNX:no exudate, no erythema and lips, buccal mucosa, and tongue normal  NECK: supple, thyroid normal size, non-tender, without nodularity LYMPH:  no palpable lymphadenopathy in the cervical, axillary or supraclavicular LUNGS: clear to auscultation and percussion with normal breathing effort HEART: regular rate & rhythm and no murmurs and no lower extremity edema ABDOMEN:abdomen soft, non-tender and normal bowel sounds Musculoskeletal:no cyanosis of digits and no clubbing  NEURO: alert & oriented x 3 with fluent speech, no focal motor/sensory deficits EXTREMITIES: One large ulcer on the right lateral surface of his right lower extremity covered with dressing, much improved in diameter since  His last visit; one smaller half a dollar sized ulcer in the medial surface of his right lower extremity also with significant improvement.   LABORATORY DATA: Results for orders placed in visit on 01/24/13 (from the past 48 hour(s))  CBC WITH DIFFERENTIAL     Status: Abnormal   Collection Time    01/24/13  9:51 AM      Result Value Range   WBC 10.5 (*) 4.0 - 10.3 10e3/uL   NEUT# 8.5 (*) 1.5 - 6.5 10e3/uL   HGB 13.6  13.0 - 17.1 g/dL   HCT 40.9  81.1 - 91.4 %   Platelets 781 (*) 140 - 400 10e3/uL   MCV 104.7 (*) 79.3 - 98.0 fL   MCH 34.3 (*) 27.2 - 33.4 pg   MCHC 32.7  32.0 - 36.0 g/dL   RBC 7.82 (*) 9.56 - 2.13 10e6/uL   RDW 16.9 (*) 11.0 - 14.6 %   lymph# 1.3  0.9 - 3.3 10e3/uL   MONO# 0.2  0.1 - 0.9 10e3/uL   Eosinophils Absolute 0.4  0.0 - 0.5 10e3/uL   Basophils Absolute 0.1  0.0 - 0.1 10e3/uL   NEUT% 81.3 (*) 39.0 - 75.0 %   LYMPH% 12.0 (*) 14.0 - 49.0 %   MONO% 1.5  0.0 - 14.0 %   EOS% 4.3  0.0 - 7.0 %    BASO% 0.9  0.0 - 2.0 %       Labs:  Lab Results  Component Value Date   WBC 10.5* 01/24/2013   HGB 13.6 01/24/2013   HCT 41.6 01/24/2013   MCV 104.7* 01/24/2013   PLT 781* 01/24/2013   NEUTROABS 8.5* 01/24/2013      Chemistry      Component Value Date/Time   NA 139 01/17/2013 0912   NA 135 06/02/2011 1020   K 4.7 01/17/2013 0912   K 5.4* 06/02/2011 1020   CL 108* 08/04/2012 1302   CL 100 06/02/2011 1020   CO2 24 01/17/2013 0912   CO2 26 06/02/2011 1020   BUN 12.1 01/17/2013 0912   BUN 12 06/02/2011 1020   CREATININE 0.8 01/17/2013 0912   CREATININE 0.87 06/02/2011 1020      Component Value Date/Time   CALCIUM 10.0 01/17/2013 0912   CALCIUM 9.2 06/02/2011 1020   ALKPHOS 101 01/17/2013 0912   ALKPHOS 55 08/02/2011 0919   AST 16 01/17/2013 0912   AST 23 08/02/2011 0919   ALT 20 01/17/2013 0912   ALT 34 08/02/2011  0919   BILITOT 0.47 01/17/2013 0912   BILITOT 0.9 08/02/2011 0919      CBC:  Recent Labs Lab 01/24/13 0951  WBC 10.5*  NEUTROABS 8.5*  HGB 13.6  HCT 41.6  MCV 104.7*  PLT 781*   No results found for this basename: VITAMINB12, FOLATE, FERRITIN, TIBC, IRON, RETICCTPCT,  in the last 72 hours  RADIOGRAPHIC STUDIES: No results found.  ASSESSMENT: Fernando Lane 62 y.o. male with a history of Leg ulcer, unspecified laterality, with unspecified severity  POLYCYTHEMIA - Plan: CBC with Differential in 1 month, CBC with Differential, CBC with Differential, CBC with Differential, Comprehensive metabolic panel   PLAN:  1. Polycythemia vera, moderately elevated plts --Patient counseled to stop Hydrea given #2. He will continue phlebotomy to maintain hematocrit less than 45% and his daily aspirin 81 mg.  He was advised  on symptoms of polycythemia including blood clots, strokes, myocardial infarction. Given the persistence of his leg ulcers and the risk of further infection which could be limb threatening, he agreed to discontinue the Hydrea.  We prescribed anagralide and  will explore an assistance program to help him start given the elevation in his plts.  He will have his CBC checked weekly and he will followup for CBC and chemistries in one month.  He was informed of symptoms on elevated plts can results in clots and some cases serious bleeding.  We hope to start him on anagrelide in the next day or two with pharmacy assistance program.   2. Multiple chronic R leg ulcers. --   Some patients on hydroxyurea for greater than 5 years had developed chronic leg ulcers despite no underlying vascular disease (Arch Dermatology. 1999 Jul; 135(7):818-20.  In this retrospective multi-institutional study,  41 patients developed chronic leg ulcers while on Hydrea,  80% of them had complete resolution upon discontinuation of hydroxyurea.  As a result, we stopped his  Hydrea. Today, he notes substantial improvement and overall increased healing to the affected sites.  We will continue to support wound care to the effected areas.  We will continue close monitoring for #1 with phlebotomy to maintain a hematocrit less than 45% and aspirin 81 mg daily and anagrelide.  He will check CBCs every week.   All questions were answered. The patient knows to call the clinic with any problems, questions or concerns. We can certainly see the patient much sooner if necessary. He was provided a handout on polycythemia and Hydrea-- which we are now discontinuing. He was also provided and after visit summary. He will followup in one month for symptom visit.   I spent 15 minutes counseling the patient face to face. The total time spent in the appointment was 25 minutes.    Tariana Moldovan, MD 01/24/2013 11:09 AM

## 2013-01-24 NOTE — Assessment & Plan Note (Signed)
Blood pressure stable ? ?

## 2013-01-24 NOTE — Patient Instructions (Signed)
Anagrelide capsules What is this medicine? ANAGRELIDE (an AG re lide) is used to lower platelet counts. This helps to prevent blood clots from forming. It also reduces the risk of problems like intestinal bleeding, stroke, and heart attack caused by having too many platelets. This medicine may be used for other purposes; ask your health care provider or pharmacist if you have questions. COMMON BRAND NAME(S): Agrylin What should I tell my health care provider before I take this medicine? They need to know if you have any of these conditions: -heart disease -kidney disease -liver disease -an unusual or allergic reaction to anagrelide, other medicines, foods, dyes, or preservatives -pregnant or trying to get pregnant -breast-feeding How should I use this medicine? Take this medicine by mouth with a glass of water. Follow the directions on the prescription label. Take your medicine at regular intervals. Do not take your medicine more often than directed. Do not stop taking except on the advice of your doctor or health care professional. Talk to your pediatrician regarding the use of this medicine in children. While this medicine may be prescribed for children as young as 72 years of age for selected conditions, precautions do apply. Overdosage: If you think you have taken too much of this medicine contact a poison control center or emergency room at once. NOTE: This medicine is only for you. Do not share this medicine with others. What if I miss a dose? If you miss a dose, take it as soon as you can. If it is almost time for your next dose, take only that dose. Do not take double or extra doses. What may interact with this medicine? Do not take this medicine with any of the following medications: -cisapride -dofetilide -dronedarone -pimozide -thioridazine -ziprasidone  This medicine may also interact with the following medications: -aspirin and aspirin-like  drugs -cilostazol -fluvoxamine -medicines called inotropes like milrinone, enoximone, amrinone, and olprinone -medicines that treat or prevent blood clots like warfarin -sucralfate -theophylline This list may not describe all possible interactions. Give your health care provider a list of all the medicines, herbs, non-prescription drugs, or dietary supplements you use. Also tell them if you smoke, drink alcohol, or use illegal drugs. Some items may interact with your medicine. What should I watch for while using this medicine? Visit your doctor or health care professional for regular checks on your progress. This medicine can make you more sensitive to the sun. Keep out of the sun. If you cannot avoid being in the sun, wear protective clothing and use sunscreen. Do not use sun lamps or tanning beds/booths. What side effects may I notice from receiving this medicine? Side effects that you should report to your doctor or health care professional as soon as possible: -allergic reactions like skin rash, itching or hives, swelling of the face, lips, or tongue -dizziness -fast or irregular heartbeat -feeling faint or lightheaded, falls -seizures -signs and symptoms of bleeding such as bloody or black, tarry stools; red or dark-brown urine; spitting up blood or brown material that looks like coffee grounds; red spots on the skin; unusual bruising or bleeding from the eye, gums, or nose -signs and symptoms of a blood clot such as breathing problems; changes in vision; chest pain; severe, sudden headache; pain, swelling, warmth in the leg; trouble speaking; sudden numbness or weakness of the face, arm, or leg -trouble passing urine or change in the amount of urine -unusually weak or tired Side effects that usually do not require medical attention (report to  your doctor or health care professional if they continue or are bothersome): -abdominal pain -back pain -diarrhea -gas -headache -loss of  appetite -nausea, vomiting This list may not describe all possible side effects. Call your doctor for medical advice about side effects. You may report side effects to FDA at 1-800-FDA-1088. Where should I keep my medicine? Keep out of the reach of children. Store at room temperature between 15 and 30 degrees C (59 and 86 degrees F). Protect from light. Throw away any unused medicine after the expiration date. NOTE: This sheet is a summary. It may not cover all possible information. If you have questions about this medicine, talk to your doctor, pharmacist, or health care provider.  2014, [employer]. (2012-06-21 14:57:11)

## 2013-01-24 NOTE — Patient Instructions (Signed)
Your physician wants you to follow-up in: [facility] IN 1 YEAR You will receive a reminder letter in the mail two months in advance. If you don't receive a letter, please call our office to schedule the follow-up appointment.  Your physician recommends that you continue on your current medications as directed. Please refer to the Current Medication list given to you today.

## 2013-01-24 NOTE — Telephone Encounter (Signed)
gv and printed appt sched and avs for pt for NOV and DEC °

## 2013-01-24 NOTE — Progress Notes (Signed)
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 He is being treated for hyperlipidemia and hypertension and also has polycythemia vera and obstructive sleep apnea. He was last seen here in March 2013. He had a 2-D echo in 2010 ejection fraction was 55-60% with normal valve mild left atrial enlargement. Lower extremity venous Dopplers were negative for DVT in September 2014. He has developed ongoing skin infections and ulcers in his right lower extremity treated with multiple antibiotics. He is being followed by the wound Center. This was felt to be a side effect from a Hydrea that he was taking which is now been stopped. He is now treated with Anagrelide and having phlebotomy done weekly.  He denies any chest pain, palpitations, dyspnea, dyspnea on exertion, dizziness, or presyncope. He walks with a walker and is very limited because of his chronic arthritis and now multiple ulcers. He used to walk around [place] but now usually rides a cart. Lipid panel in October 2014 as listed below.  No Known Allergies  Current Outpatient Prescriptions on File Prior to Visit: anagrelide (AGRYLIN) 0.5 MG capsule, Take 2 capsules (1 mg total) by mouth 2 (two) times daily., Disp: 60 capsule, Rfl: 1 aspirin 81 MG tablet, Take 81 mg by mouth daily.  , Disp: , Rfl:  cyclobenzaprine (FLEXERIL) 5 MG tablet, Take 1 tablet (5 mg total) by mouth 3 (three) times daily as needed for muscle spasms., Disp: 30 tablet, Rfl: 1 DULoxetine (CYMBALTA) 30 MG capsule, Take 1 capsule (30 mg total) by mouth daily., Disp: 30 capsule, Rfl: 5 HYDROcodone-acetaminophen (NORCO) 7.5-325 MG per tablet, Take 1 tablet by mouth every 8 (eight) hours as needed. May fill once every 30 days, Disp: 90 tablet, Rfl: 0 lisinopril-hydrochlorothiazide (PRINZIDE,ZESTORETIC) 20-12.5 MG per tablet, Take 1 tablet by mouth daily., Disp: 90 tablet,  Rfl: 1 meclizine (ANTIVERT) 12.5 MG tablet, TAKE ONE TABLET BY MOUTH EVERY 4 HOURS AS NEEDED, Disp: 40 tablet, Rfl: 0 metoprolol succinate (TOPROL-XL) 25 MG 24 hr tablet, Take 1 tablet (25 mg total) by mouth daily., Disp: 90 tablet, Rfl: 1 mupirocin ointment (BACTROBAN) 2 %, APPLY   TOPICALLY TO AFFECTED AREA TWICE DAILY, Disp: 22 g, Rfl: 2 naproxen (NAPROSYN) 500 MG tablet, TAKE ONE TABLET BY MOUTH TWICE DAILY WITH MEALS, Disp: 30 tablet, Rfl: 2 pravastatin (PRAVACHOL) 20 MG tablet, Take 1 tablet (20 mg total) by mouth daily., Disp: 90 tablet, Rfl: 1 traMADol (ULTRAM) 50 MG tablet, TAKE ONE (1) TABLET BY MOUTH EVERY 6 HOURS AS NEEDED FOR PAIN, Disp: 120 tablet, Rfl: 0  No current facility-administered medications on file prior to visit.   Past Medical History:   Chronic low back pain                                        Myeloproliferative disorder                                    Comment:Polycythemia; managed by heme-taking               hydroxyurera   Chronic neck pain  OSA (obstructive sleep apnea)                                  Comment:CPAP @ bedtime   Allergy                                                      Unspecified essential hypertension                             Comment:Resistant, 2D Echo - [id number]   Polycythemia                                                 CAD (coronary artery disease)                                  Comment:Vessel type unspecified   PVD (peripheral vascular disease)                            DDD (degenerative disc disease), cervical                   Past Surgical History:   Lower extrmity doppler                                          Comment:US 2008; no evidence for PAD   US ECHOCARDIOGRAPHY                              11/2008         Comment:Normal valves, mild LAE   Left knee replacement                            10/2006       Cervical spine surgery                           02/2008       JOINT REPLACEMENT                                             SPINE SURGERY                                                Review of patient's family history indicates:   Heart attack                   Father                   Social History  Marital Status: Single              Spouse Name:                      Years of Education:                 Number of children: 1           Occupational History Occupation          Associate Professor            Comment                Social History Main Topics   Smoking Status: Former Smoker                   Packs/Day: 0.00  Years: 0         Types: Cigarettes, Pipe     Quit date: 03/15/1968   Smokeless Status: Former Neurosurgeon                      Comment: Pt quit Cigarettes 1970's- and cigars & chew            1990   Alcohol Use: Yes           3.6 oz/week      6 Cans of beer per week   Drug Use: No             Sexual Activity: Not on file        Other Topics            Concern   None on file  Social History Narrative   Divorced, lives alone. Daughter lives in town. Disable/Former management/construction    ROS: See history of present illness otherwise negative   PHYSICAL EXAM: Obese, in no acute distress. Neck: No JVD, HJR, Bruit, or thyroid enlargement  Lungs: No tachypnea, clear without wheezing, rales, or rhonchi  Cardiovascular: RRR, PMI not displaced, heart sounds normal, no murmurs, gallops, bruit, thrill, or heave.  Abdomen: BS normal. Soft without organomegaly, masses, lesions or tenderness.  Extremities: Multiple wounds that are dressed on the right lower extremity +1 edema bilaterally of the lower extremities with some venous stasis changes, decreased distal pulses bilateral  SKin: Multiple ulcers on his right lower leg bandaged  Musculoskeletal: No deformities  Neuro: no focal signs  BP 125/72  Pulse 69  Ht 5' 5.56" (1.665 m)  Wt 230 lb (104.327 kg)  BMI 37.63 kg/m2    EKG: Normal sinus rhythm  2Decho 11/2008: Study  Conclusions   1. Left ventricle: The cavity size was normal. Wall thickness was     normal. Systolic function was normal. The estimated ejection     fraction was in the range of 55% to 60%.  2. Left atrium: The atrium was mildly dilated.     Ref. Range 12/20/2012 15:43  Cholesterol Latest Range: 0-200 mg/dL 161  Triglycerides Latest Range: 0.0-149.0 mg/dL 09.6  HDL Latest Range: >39.00 mg/dL 04.54  LDL (calc) Latest Range: 0-99 mg/dL 75  VLDL Latest Range: 0.0-40.0 mg/dL 09.8  Total CHOL/HDL Ratio No range found 3

## 2013-01-29 ENCOUNTER — Telehealth: Payer: Self-pay | Admitting: Medical Oncology

## 2013-01-29 ENCOUNTER — Telehealth: Payer: Self-pay | Admitting: Internal Medicine

## 2013-01-29 DIAGNOSIS — M545 Low back pain: Secondary | ICD-10-CM

## 2013-01-29 NOTE — Telephone Encounter (Signed)
Pt called back with a different phone number to call. [phone].  Pt is aware Dr. Felicity Coyer is off part of this week.

## 2013-01-29 NOTE — Telephone Encounter (Signed)
01/29/2013   Pt is requesting a new referral to a back/neuro surgeon.  Pt requests that it not be anyone with [facility].

## 2013-01-29 NOTE — Telephone Encounter (Signed)
Pt called and states that his is having trouble with his right lower back. He is not sure if he has pulled a muscle or has a pinched nerve. He states it is hard for him to lift his foot off the ground. Pt states that he needs to have 2 back surgeries but is trying to get his blood disorder fixed first. I explained this is not related to what Dr. Rosie Fate is treating him for. He has seen Dr. Yetta Barre in this past for his back. I advised him to call Dr. Yetta Barre and speak with his nurse. He states he just wanted to make sure this was not related to his polycythemia and he will call Dr. Yetta Barre.

## 2013-01-30 NOTE — Telephone Encounter (Signed)
Will hold & forward to md when she return back on Thursday.../lmb 

## 2013-01-31 ENCOUNTER — Telehealth: Payer: Self-pay | Admitting: *Deleted

## 2013-01-31 ENCOUNTER — Other Ambulatory Visit: Payer: Medicare Other

## 2013-01-31 ENCOUNTER — Other Ambulatory Visit: Payer: Self-pay | Admitting: Medical Oncology

## 2013-01-31 ENCOUNTER — Telehealth: Payer: Self-pay | Admitting: Internal Medicine

## 2013-01-31 ENCOUNTER — Other Ambulatory Visit (HOSPITAL_COMMUNITY): Payer: Self-pay | Admitting: Pharmacy Technician

## 2013-01-31 DIAGNOSIS — D751 Secondary polycythemia: Secondary | ICD-10-CM

## 2013-01-31 MED ORDER — MECLIZINE HCL 12.5 MG PO TABS: ORAL_TABLET | ORAL | Status: DC

## 2013-01-31 MED ORDER — ANAGRELIDE HCL 0.5 MG PO CAPS: 1.0000 mg | ORAL_CAPSULE | Freq: Two times a day (BID) | ORAL | Status: DC

## 2013-01-31 NOTE — Telephone Encounter (Signed)
Meclizine done erx  Ok to stop the cymbalta if feels not helping or causing side effect,  No wean needed if only taking 30 mg per day

## 2013-01-31 NOTE — Telephone Encounter (Signed)
Gave pt apppt for lab and MD for December 2014, pt will see Dr. Gross 11/15th °

## 2013-01-31 NOTE — Telephone Encounter (Signed)
Fernando Lane called requesting Meclizine refill.  Further states pt wants to stop taking Cymbalta, requesting whether he should be weaned off.  Please advise

## 2013-02-01 NOTE — Telephone Encounter (Signed)
Refer done for spine and scoli specilaists

## 2013-02-01 NOTE — Telephone Encounter (Signed)
Returned call to Fernando Lane and advised no in the office until 9:45.

## 2013-02-01 NOTE — Telephone Encounter (Signed)
Spoke with Delice Bison at pharmacy advised of MDs message

## 2013-02-01 NOTE — Telephone Encounter (Signed)
Notified pt referral has been place will be contacted once appt has been set up with appt, date and time...Raechel Chute

## 2013-02-02 ENCOUNTER — Other Ambulatory Visit: Payer: Medicare Other | Admitting: Lab

## 2013-02-06 ENCOUNTER — Telehealth: Payer: Self-pay | Admitting: Internal Medicine

## 2013-02-06 DIAGNOSIS — IMO0002 Reserved for concepts with insufficient information to code with codable children: Secondary | ICD-10-CM

## 2013-02-06 NOTE — Telephone Encounter (Signed)
Okey Regal called from [facility] stated that she spoke with someone Friday about getting our office to fax the written Rx for wheelchair to her company because Mr. Gill is unable to get the rx to them. Please advise.

## 2013-02-06 NOTE — Telephone Encounter (Signed)
Spoke with Okey Regal from [facility] advised DME written waiting MD signature

## 2013-02-07 ENCOUNTER — Emergency Department (HOSPITAL_COMMUNITY)
Admission: EM | Admit: 2013-02-07 | Discharge: 2013-02-07 | Disposition: A | Discharge status: Home / Self Care | Payer: Medicare Other | Attending: Emergency Medicine | Admitting: Emergency Medicine

## 2013-02-07 ENCOUNTER — Emergency Department (HOSPITAL_COMMUNITY): Payer: Medicare Other

## 2013-02-07 ENCOUNTER — Other Ambulatory Visit (HOSPITAL_BASED_OUTPATIENT_CLINIC_OR_DEPARTMENT_OTHER): Payer: Medicare Other

## 2013-02-07 ENCOUNTER — Telehealth: Payer: Self-pay | Admitting: Internal Medicine

## 2013-02-07 ENCOUNTER — Encounter (HOSPITAL_COMMUNITY): Payer: Self-pay | Admitting: Emergency Medicine

## 2013-02-07 DIAGNOSIS — G4733 Obstructive sleep apnea (adult) (pediatric): Secondary | ICD-10-CM | POA: Insufficient documentation

## 2013-02-07 DIAGNOSIS — D751 Secondary polycythemia: Secondary | ICD-10-CM

## 2013-02-07 DIAGNOSIS — Z87891 Personal history of nicotine dependence: Secondary | ICD-10-CM | POA: Insufficient documentation

## 2013-02-07 DIAGNOSIS — Y929 Unspecified place or not applicable: Secondary | ICD-10-CM | POA: Insufficient documentation

## 2013-02-07 DIAGNOSIS — IMO0002 Reserved for concepts with insufficient information to code with codable children: Secondary | ICD-10-CM | POA: Insufficient documentation

## 2013-02-07 DIAGNOSIS — L97909 Non-pressure chronic ulcer of unspecified part of unspecified lower leg with unspecified severity: Secondary | ICD-10-CM | POA: Insufficient documentation

## 2013-02-07 DIAGNOSIS — Z791 Long term (current) use of non-steroidal anti-inflammatories (NSAID): Secondary | ICD-10-CM | POA: Insufficient documentation

## 2013-02-07 DIAGNOSIS — I10 Essential (primary) hypertension: Secondary | ICD-10-CM | POA: Insufficient documentation

## 2013-02-07 DIAGNOSIS — I739 Peripheral vascular disease, unspecified: Secondary | ICD-10-CM | POA: Insufficient documentation

## 2013-02-07 DIAGNOSIS — Z7982 Long term (current) use of aspirin: Secondary | ICD-10-CM | POA: Insufficient documentation

## 2013-02-07 DIAGNOSIS — Z96659 Presence of unspecified artificial knee joint: Secondary | ICD-10-CM | POA: Insufficient documentation

## 2013-02-07 DIAGNOSIS — L539 Erythematous condition, unspecified: Secondary | ICD-10-CM | POA: Insufficient documentation

## 2013-02-07 DIAGNOSIS — M545 Low back pain, unspecified: Secondary | ICD-10-CM | POA: Insufficient documentation

## 2013-02-07 DIAGNOSIS — M542 Cervicalgia: Secondary | ICD-10-CM | POA: Insufficient documentation

## 2013-02-07 DIAGNOSIS — G8929 Other chronic pain: Secondary | ICD-10-CM | POA: Insufficient documentation

## 2013-02-07 DIAGNOSIS — W19XXXA Unspecified fall, initial encounter: Secondary | ICD-10-CM

## 2013-02-07 DIAGNOSIS — M25562 Pain in left knee: Secondary | ICD-10-CM

## 2013-02-07 DIAGNOSIS — Y9389 Activity, other specified: Secondary | ICD-10-CM | POA: Insufficient documentation

## 2013-02-07 DIAGNOSIS — M25569 Pain in unspecified knee: Secondary | ICD-10-CM | POA: Insufficient documentation

## 2013-02-07 DIAGNOSIS — W1809XA Striking against other object with subsequent fall, initial encounter: Secondary | ICD-10-CM | POA: Insufficient documentation

## 2013-02-07 DIAGNOSIS — Z79899 Other long term (current) drug therapy: Secondary | ICD-10-CM | POA: Insufficient documentation

## 2013-02-07 DIAGNOSIS — D45 Polycythemia vera: Secondary | ICD-10-CM

## 2013-02-07 DIAGNOSIS — I2510 Atherosclerotic heart disease of native coronary artery without angina pectoris: Secondary | ICD-10-CM | POA: Insufficient documentation

## 2013-02-07 DIAGNOSIS — D47Z9 Other specified neoplasms of uncertain behavior of lymphoid, hematopoietic and related tissue: Secondary | ICD-10-CM | POA: Insufficient documentation

## 2013-02-07 DIAGNOSIS — M5030 Other cervical disc degeneration, unspecified cervical region: Secondary | ICD-10-CM | POA: Insufficient documentation

## 2013-02-07 LAB — CBC WITH DIFFERENTIAL/PLATELET
Basophils Absolute: 0 10*3/uL (ref 0.0–0.1)
Eosinophils Absolute: 0.5 10*3/uL (ref 0.0–0.5)
MCH: 33.2 pg (ref 27.2–33.4)
MCV: 102.2 fL — ABNORMAL HIGH (ref 79.3–98.0)
MONO#: 0.3 10*3/uL (ref 0.1–0.9)
MONO%: 2.1 % (ref 0.0–14.0)
NEUT%: 82.7 % — ABNORMAL HIGH (ref 39.0–75.0)
Platelets: 555 10*3/uL — ABNORMAL HIGH (ref 140–400)
RDW: 15.6 % — ABNORMAL HIGH (ref 11.0–14.6)
WBC: 11.9 10*3/uL — ABNORMAL HIGH (ref 4.0–10.3)

## 2013-02-07 MED ORDER — TRAMADOL HCL 50 MG PO TABS
50.0000 mg | ORAL_TABLET | Freq: Once | ORAL | Status: AC
  Administered 2013-02-07: 50 mg via ORAL
  Filled 2013-02-07: qty 1

## 2013-02-07 NOTE — ED Notes (Signed)
Pt to xray

## 2013-02-07 NOTE — Telephone Encounter (Signed)
No answer

## 2013-02-07 NOTE — ED Provider Notes (Signed)
CSN: [id number]     Arrival date & time 02/07/13  1008 History   First MD Initiated Contact with Patient 02/07/13 1024     Chief Complaint  Patient presents with  . Fall   (Consider location/radiation/quality/duration/timing/severity/associated sxs/prior Treatment) The history is provided by the patient. No language interpreter was used.  Fernando Lane is a 62 year old male with past medical history of chronic back pain, chronic neck pain, OSA, hypertension, CAD, PVD, DDD presenting to emergency department with a fall that occurred this morning, prior to arrival to the emergency department. Patient reports that while he was on his way to the wound center clinic this morning for an appointment regarding his ulcers on his right lower extremity, reported that his left leg gave out and he fell backwards. Patient reports that he rolled onto his back, landing on his buttocks and hitting his head on the prior. Patient reports that every once in a while his left knee does give out on him. Patient reports that he has had problems with his left knee in the past, reported knee replacement in 2007. Patient reports that he is feeling a cramping sensation and tightness to the left knee with mild radiation to the left thigh. Patient reports he hit is head on the tire. Denies loss of consciousness, dizziness, blurred vision, back pain, neck pain, confusion, numbness, tingling, loss of sensation. PCP Dr. Willey Blade  Patient reports that he has called the wound clinic regarding the appointment, stated that the wound clinic is aware that he is here in the emergency department to be evaluated and that they will see him once he is discharged.  Past Medical History  Diagnosis Date  . Chronic low back pain   . 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 Chronic neck pain   . OSA (obstructive sleep apnea)     CPAP @ bedtime  . Allergy   . Unspecified essential hypertension      Resistant, 2D Echo - EF-55-60  . Polycythemia   . CAD (coronary artery disease)     Vessel type unspecified  . PVD (peripheral vascular disease)   . DDD (degenerative disc disease), cervical    Past Surgical History  Procedure Laterality Date  . Lower extrmity doppler      [place] 2008; no evidence for PAD  . US echocardiography  11/2008    Normal valves, mild LAE  . Left knee replacement  10/2006  . Cervical spine surgery  02/2008  . Joint replacement    . Spine surgery     Family History  Problem Relation Age of Onset  . Heart attack Father    History  Substance Use Topics  . Smoking status: Former Smoker -- 0 years    Types: Cigarettes, Pipe    Quit date: 03/15/1968  . Smokeless tobacco: Former Neurosurgeon     Comment: Pt quit Cigarettes 1970's- and cigars & chew 1990  . Alcohol Use: 3.6 oz/week    6 Cans of beer per week    Review of Systems  Musculoskeletal: Positive for arthralgias (Left knee). Negative for back pain and neck pain.  Neurological: Negative for dizziness, weakness and numbness.  All other systems reviewed and are negative.    Allergies  Review of patient's allergies indicates no known allergies.  Home Medications   Current Outpatient Rx  Name  Route  Sig  Dispense  Refill  . anagrelide (AGRYLIN) 0.5 MG capsule   Oral  Take 2 capsules (1 mg total) by mouth 2 (two) times daily.   60 capsule   2   . aspirin 81 MG tablet   Oral   Take 81 mg by mouth daily.           . cyclobenzaprine (FLEXERIL) 5 MG tablet   Oral   Take 1 tablet (5 mg total) by mouth 3 (three) times daily as needed for muscle spasms.   30 tablet   1   . HYDROcodone-acetaminophen (NORCO) 7.5-325 MG per tablet   Oral   Take 1 tablet by mouth every 8 (eight) hours as needed. May fill once every 30 days   90 tablet   0   . lisinopril-hydrochlorothiazide (PRINZIDE,ZESTORETIC) 20-12.5 MG per tablet   Oral   Take 1 tablet by mouth daily.   90 tablet   1   . meclizine  (ANTIVERT) 12.5 MG tablet   Oral   Take 12.5 mg by mouth every 4 (four) hours as needed for dizziness.         . metoprolol succinate (TOPROL-XL) 25 MG 24 hr tablet   Oral   Take 1 tablet (25 mg total) by mouth daily.   90 tablet   1   . mupirocin ointment (BACTROBAN) 2 %   Topical   Apply 1 application topically 2 (two) times daily.         . naproxen (NAPROSYN) 500 MG tablet   Oral   Take 500 mg by mouth 2 (two) times daily with a meal.         . pravastatin (PRAVACHOL) 20 MG tablet   Oral   Take 1 tablet (20 mg total) by mouth daily.   90 tablet   1   . traMADol (ULTRAM) 50 MG tablet   Oral   Take 50 mg by mouth every 6 (six) hours as needed (pain).          BP 98/79  Pulse 74  Temp(Src) 98.4 F (36.9 C) (Oral)  Resp 18  SpO2 99% Physical Exam  Nursing note and vitals reviewed. Constitutional: He is oriented to person, place, and time. He appears well-developed and well-nourished. No distress.  HENT:  Head: Normocephalic.  Small abrasion noted to the left parietal region of the back of the head - negative pain upon palpation. Negative hematomas noted.   Eyes: Conjunctivae and EOM are normal. Pupils are equal, round, and reactive to light. Right eye exhibits no discharge. Left eye exhibits no discharge.  Negative nystagmus  Neck: Normal range of motion. Neck supple. No tracheal deviation present.  Negative pain upon palpation to the c-spine  Cardiovascular: Normal rate and normal heart sounds.  Exam reveals no friction rub.   No murmur heard. Pulses:      Radial pulses are 2+ on the right side, and 2+ on the left side.       Dorsalis pedis pulses are 2+ on the right side, and 2+ on the left side.  Cap refill < 3 seconds to the digits of bilateral feet  Pulmonary/Chest: Effort normal and breath sounds normal. No respiratory distress. He has no wheezes. He has no rales.  Musculoskeletal: Normal range of motion. He exhibits tenderness.  Full ROM to upper  extremities bilaterally without difficulty.  Negative swelling, erythema, inflammation, deformities, warmth noted to the left knee. Discomfort upon palpation to the left knee - anterior, patella aspect. Full ROM to the left knee - full flexion and extension. Stable knee joint. Superficial  healing abrasion noted to the left knee.   Neurological: He is alert and oriented to person, place, and time. No cranial nerve deficit. Coordination normal. GCS eye subscore is 4. GCS verbal subscore is 5. GCS motor subscore is 6.  Cranial nerves III-XII grossly intact Sensation intact, mildly decreased to BLE secondary to neuropathy.   Skin: Skin is warm. He is not diaphoretic.  Ulcers noted to the medial and lateral aspect of the RLE - largest noted to be on the lateral aspect. Negative active drainage, bleeding, or weeping noted. Surrounding erythema noted - negative warmth upon palpation. Patient currently seeing wound care clinic for issue.   Psychiatric: He has a normal mood and affect. His behavior is normal. Thought content normal.    ED Course  Procedures (including critical care time)  Ct Head Wo Contrast  02/07/2013   CLINICAL DATA:  Status post fall.  EXAM: CT HEAD WITHOUT CONTRAST  TECHNIQUE: Contiguous axial images were obtained from the base of the skull through the vertex without intravenous contrast.  COMPARISON:  None.  FINDINGS: There is no evidence of intra-axial not extra-axial fluid collections, nor evidence of acute hemorrhage. There is no evidence of subfalcine or tonsillar herniation. There is no evidence of a depressed skull fracture. The visualized paranasal sinuses and mastoid air cells are patent. There is mild diffuse cortical atrophy.  IMPRESSION: No evidence of focal acute abnormalities.   Electronically Signed   By: Salome Holmes M.D.   On: 02/07/2013 11:59   Dg Knee Complete 4 Views Left  02/07/2013   CLINICAL DATA:  Fall.  EXAM: LEFT KNEE - COMPLETE 4+ VIEW  COMPARISON:  None.   FINDINGS: Prior left knee replacement. No acute bony abnormality. Specifically, no fracture, subluxation, or dislocation. Soft tissues are intact. No joint effusion.  IMPRESSION: Left knee replacement.  No hardware or bony complicating feature.   Electronically Signed   By: Charlett Nose M.D.   On: 02/07/2013 11:30    Labs Review Labs Reviewed - No data to display Imaging Review Ct Head Wo Contrast  02/07/2013   CLINICAL DATA:  Status post fall.  EXAM: CT HEAD WITHOUT CONTRAST  TECHNIQUE: Contiguous axial images were obtained from the base of the skull through the vertex without intravenous contrast.  COMPARISON:  None.  FINDINGS: There is no evidence of intra-axial not extra-axial fluid collections, nor evidence of acute hemorrhage. There is no evidence of subfalcine or tonsillar herniation. There is no evidence of a depressed skull fracture. The visualized paranasal sinuses and mastoid air cells are patent. There is mild diffuse cortical atrophy.  IMPRESSION: No evidence of focal acute abnormalities.   Electronically Signed   By: Salome Holmes M.D.   On: 02/07/2013 11:59   Dg Knee Complete 4 Views Left  02/07/2013   CLINICAL DATA:  Fall.  EXAM: LEFT KNEE - COMPLETE 4+ VIEW  COMPARISON:  None.  FINDINGS: Prior left knee replacement. No acute bony abnormality. Specifically, no fracture, subluxation, or dislocation. Soft tissues are intact. No joint effusion.  IMPRESSION: Left knee replacement.  No hardware or bony complicating feature.   Electronically Signed   By: Charlett Nose M.D.   On: 02/07/2013 11:30    EKG Interpretation   None       MDM   1. Fall, initial encounter   2. Left knee pain    Medications  traMADol (ULTRAM) tablet 50 mg (50 mg Oral Given 02/07/13 1205)   Filed Vitals:   02/07/13 1017  BP: 98/79  Pulse: 74  Temp: 98.4 F (36.9 C)  TempSrc: Oral  Resp: 18  SpO2: 99%    Patient presenting to emergency department with a fall that occurred this morning, just prior  to arrival to the emergency department. Patient reports that his left knee gave out which resulted in him falling backwards landing on his buttocks and hitting his head on the tire. Denied loss of consciousness, confusion, visual distortions, dizziness. Alert and oriented. GCS 15. Full range of motion to upper extremities and lower extremities without difficulty. Heart rate and rhythm normal. Pulses palpable and strong-radial and DP 2+ bilaterally. Small abrasion localized to the back of the left parietal aspect of the head-negative pain upon palpation. Negative hematomas palpated. Full range of motion to the neck, supple. Left knee negative swelling, erythema, inflammation noted-healing superficial abrasion identified. Full range of motion to left knee. Discomfort upon palpation to the anterior aspect of the left knee, patellar region. Stable left knee joint. Right lower extremity consisting of ulcers to medial and lateral aspect-largest is lateral aspect-properly healing - negative swelling, drainage, weeping, bleeding noted-mild erythema identified with negative warmth upon palpation. Negative pain upon palpation to ulcers-patient currently being seen by wound care clinic regarding ulcer treatment, has an appointment this morning. Left knee plain film with negative findings for subluxation, fracture, dislocation, negative joint effusion. Left knee replacement noted with no complications to hardware. CT head negative findings for ICH, acute intracranial injuries.  Patient stable, afebrile, hemodynamically stable. Negative findings for acute fractures or injuries. Negative intracranial abnormalities noted on the CT scan. GCS 15. Patient neurovascularly intact. Negative signs of traumatic injury. Discharged patient. Patient placed in knee sleeve for comfort. Referred patient to primary care provider, orthopedic. Discussed with patient upon discharge patient is to go to wound care clinic in order for ulcers to be  attended to. Discussed with patient to rest, ice, elevate. Discussed with patient to closely monitor symptoms and if symptoms are to worsen or change to report back to emergency department - strict return instructions given. Patient agreed to plan of care, understood, all questions answered.    Raymon Mutton, PA-C 02/07/13 1238  Oksana Deberry, PA-C 02/07/13 1240

## 2013-02-07 NOTE — ED Notes (Signed)
Pt reports he was going to wound center for treatment of two ulcers on on right lower leg. Pt reports his left knee gave out. Pt fell backwards, hit his head on a tire, no LOC. Reports his buttocks hurts, 2/10 pain. Alert and oriented x4, denies dizziness or blurry vision.

## 2013-02-09 NOTE — ED Provider Notes (Signed)
Medical screening examination/treatment/procedure(s) were performed by non-physician practitioner and as supervising physician I was immediately available for consultation/collaboration.   Melissaann Dizdarevic T Debera Sterba, MD 02/09/13 1534 

## 2013-02-14 ENCOUNTER — Encounter (HOSPITAL_BASED_OUTPATIENT_CLINIC_OR_DEPARTMENT_OTHER): Payer: Medicare Other | Attending: General Surgery

## 2013-02-14 ENCOUNTER — Other Ambulatory Visit (HOSPITAL_BASED_OUTPATIENT_CLINIC_OR_DEPARTMENT_OTHER): Payer: Medicare Other

## 2013-02-14 ENCOUNTER — Other Ambulatory Visit: Payer: Self-pay

## 2013-02-14 ENCOUNTER — Telehealth: Payer: Self-pay

## 2013-02-14 ENCOUNTER — Telehealth: Payer: Self-pay | Admitting: Internal Medicine

## 2013-02-14 DIAGNOSIS — D45 Polycythemia vera: Secondary | ICD-10-CM

## 2013-02-14 DIAGNOSIS — I87339 Chronic venous hypertension (idiopathic) with ulcer and inflammation of unspecified lower extremity: Secondary | ICD-10-CM | POA: Insufficient documentation

## 2013-02-14 DIAGNOSIS — D751 Secondary polycythemia: Secondary | ICD-10-CM

## 2013-02-14 LAB — CBC WITH DIFFERENTIAL/PLATELET
Basophils Absolute: 0 10*3/uL (ref 0.0–0.1)
EOS%: 3.9 % (ref 0.0–7.0)
Eosinophils Absolute: 0.5 10*3/uL (ref 0.0–0.5)
HCT: 43.7 % (ref 38.4–49.9)
HGB: 14.3 g/dL (ref 13.0–17.1)
LYMPH%: 10.9 % — ABNORMAL LOW (ref 14.0–49.0)
MCH: 33.1 pg (ref 27.2–33.4)
MCV: 100.9 fL — ABNORMAL HIGH (ref 79.3–98.0)
MONO%: 2.7 % (ref 0.0–14.0)
NEUT#: 10.4 10*3/uL — ABNORMAL HIGH (ref 1.5–6.5)
NEUT%: 82.3 % — ABNORMAL HIGH (ref 39.0–75.0)
Platelets: 598 10*3/uL — ABNORMAL HIGH (ref 140–400)
RDW: 16.5 % — ABNORMAL HIGH (ref 11.0–14.6)

## 2013-02-14 NOTE — Telephone Encounter (Signed)
Pt returned Dr Charlesetta Garibaldi call. He is doing OK after his fall at the wound center. His knees are a little more sore. He did mention he is starting to get headachey and a little dizzy. These are symptoms he gets when counts start rising. I asked pt if he wants earlier phlebotomy appt or wait to scheduled 12/10 appt. He opted to keep 12/10 appt and said he would call if he feels he needs it sooner.

## 2013-02-14 NOTE — Telephone Encounter (Signed)
Left message instructing him to call to discuss lab results.

## 2013-02-14 NOTE — Telephone Encounter (Signed)
Kim called from lab asking if Fernando Lane needs phlebotomy. Goal is < 45 and pt is at 43.7. Pt does not need phlebotomy today.

## 2013-02-15 ENCOUNTER — Telehealth: Payer: Self-pay | Admitting: Internal Medicine

## 2013-02-15 NOTE — Telephone Encounter (Signed)
per 12/3 pof added phleb after 12/10 f/u. per desk nurse pt aware phelb being added.

## 2013-02-16 ENCOUNTER — Telehealth: Payer: Self-pay

## 2013-02-16 MED ORDER — HYDROCODONE-ACETAMINOPHEN 7.5-325 MG PO TABS: 1.0000 | ORAL_TABLET | Freq: Three times a day (TID) | ORAL | Status: DC | PRN

## 2013-02-16 NOTE — Telephone Encounter (Signed)
The patient is hoping to get a rx refill of Hydrocodone.  He is hoping to pick up this rx on the 10th.  Thanks!

## 2013-02-16 NOTE — Telephone Encounter (Signed)
Called pt no answer LMOM rx ready for pick-up.../lmb 

## 2013-02-16 NOTE — Telephone Encounter (Signed)
ok 

## 2013-02-21 ENCOUNTER — Ambulatory Visit (INDEPENDENT_AMBULATORY_CARE_PROVIDER_SITE_OTHER): Payer: Medicare Other | Admitting: Internal Medicine

## 2013-02-21 ENCOUNTER — Ambulatory Visit (INDEPENDENT_AMBULATORY_CARE_PROVIDER_SITE_OTHER)
Admission: RE | Admit: 2013-02-21 | Discharge: 2013-02-21 | Disposition: A | Discharge status: Home / Self Care | Payer: Medicare Other | Source: Ambulatory Visit | Attending: Internal Medicine | Admitting: Internal Medicine

## 2013-02-21 ENCOUNTER — Other Ambulatory Visit: Payer: Self-pay

## 2013-02-21 ENCOUNTER — Ambulatory Visit: Payer: Medicare Other

## 2013-02-21 ENCOUNTER — Telehealth: Payer: Self-pay

## 2013-02-21 ENCOUNTER — Telehealth: Payer: Self-pay | Admitting: *Deleted

## 2013-02-21 ENCOUNTER — Other Ambulatory Visit: Payer: Medicare Other

## 2013-02-21 ENCOUNTER — Encounter: Payer: Self-pay | Admitting: Internal Medicine

## 2013-02-21 VITALS — BP 132/70 | HR 101 | Temp 97.8°F

## 2013-02-21 DIAGNOSIS — L97909 Non-pressure chronic ulcer of unspecified part of unspecified lower leg with unspecified severity: Secondary | ICD-10-CM

## 2013-02-21 DIAGNOSIS — M545 Low back pain, unspecified: Secondary | ICD-10-CM

## 2013-02-21 DIAGNOSIS — G609 Hereditary and idiopathic neuropathy, unspecified: Secondary | ICD-10-CM

## 2013-02-21 DIAGNOSIS — R0781 Pleurodynia: Secondary | ICD-10-CM

## 2013-02-21 DIAGNOSIS — IMO0002 Reserved for concepts with insufficient information to code with codable children: Secondary | ICD-10-CM

## 2013-02-21 DIAGNOSIS — R079 Chest pain, unspecified: Secondary | ICD-10-CM

## 2013-02-21 DIAGNOSIS — L97919 Non-pressure chronic ulcer of unspecified part of right lower leg with unspecified severity: Secondary | ICD-10-CM

## 2013-02-21 DIAGNOSIS — G629 Polyneuropathy, unspecified: Secondary | ICD-10-CM

## 2013-02-21 MED ORDER — GABAPENTIN 300 MG PO CAPS: 300.0000 mg | ORAL_CAPSULE | Freq: Three times a day (TID) | ORAL | Status: DC

## 2013-02-21 NOTE — Assessment & Plan Note (Signed)
Chronic narcotic use to control same symptoms  [facility] with [facility] 02/2009 for same - but no longer wishes to follow there  Also chronic neck pain - prior ACDF reviewed ?myelopathy contributing to gait and balance problems with frequent falls - Previously followed with Dr Yetta Barre NS for same but "not planning surgery until these ulcers heal"; again requests new spine specialist -   Referral to ortho spineand scoliosis specialist done November 20, appears no appointment yet scheduled. We'll ask the office to followup on same

## 2013-02-21 NOTE — Patient Instructions (Signed)
It was good to see you today.  We have reviewed your prior records including labs and tests today  xray(s) ordered today. Your results will be released to MyChart (or called to you) after review, usually within 72hours after test completion. If any changes need to be made, you will be notified at that same time.  Begin gabapentin 300mg  at bedtime, up to 3 times daily for neuropathy foot pain - Your prescription(s) have been submitted to your pharmacy. Please take as directed and contact our office if you believe you are having problem(s) with the medication(s).  No other medication changes recommended at this time  Will followup on referral to spine specialist as discussed

## 2013-02-21 NOTE — Telephone Encounter (Signed)
I have called and gave the patient appts

## 2013-02-21 NOTE — Telephone Encounter (Signed)
Per staff message and POF I have scheduled appts.  JMW  

## 2013-02-21 NOTE — Progress Notes (Signed)
Pre-visit discussion using our clinic review tool. No additional management support is needed unless otherwise documented below in the visit note.  

## 2013-02-21 NOTE — Assessment & Plan Note (Signed)
RLE open leg wounds since 10/2011 -  Wound care clinic mgmt reviewed - Dr Jimmey Ralph - weekly OV historically poor response to I&D and antibiotics   New heme DC'd hydroxyurea due to possible side effects causing ulcers - MUCH improved! Continue wound care ongoing with [facility] since 02/15/12  continue pain mgmt - same as ongoing for chronic back and neck pain symptoms

## 2013-02-21 NOTE — Progress Notes (Signed)
  Subjective:    Patient ID: Fernando Lane, male    DOB: 1950/10/17, 62 y.o.   MRN: [id number]  HPI  Here for follow up - reviewed chronic medical issues and interval medical events  fall November 26 with exacerbation of chronic low back pain. Also reports musculoskeletal rib pain L>R since fall  Also complains of numbness and burning sensation in bilateral feet  Also has not heard regarding referral to spine specialist as requested late November   Past Medical History  Diagnosis Date  . Chronic low back pain   . 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 Chronic neck pain   . OSA (obstructive sleep apnea)     CPAP @ bedtime  . Allergy   . Unspecified essential hypertension     Resistant, 2D Echo - EF-55-60  . Polycythemia   . CAD (coronary artery disease)     Vessel type unspecified  . PVD (peripheral vascular disease)   . DDD (degenerative disc disease), cervical     Review of Systems  Constitutional: Positive for fatigue. Negative for fever and unexpected weight change.  HENT: Negative for postnasal drip and sinus pressure.   Respiratory: Positive for cough. Negative for shortness of breath and wheezing.   Cardiovascular: Negative for chest pain, palpitations and leg swelling.  Musculoskeletal: Positive for back pain and gait problem.  Skin: Positive for wound (L knee, RLE - in [facility] since 02/2012). Negative for rash.  Neurological: Negative for dizziness and headaches.  Hematological: Does not bruise/bleed easily.  Psychiatric/Behavioral: Negative for dysphoric mood. The patient is not nervous/anxious.        Objective:   Physical Exam BP 132/70  Pulse 101  Temp(Src) 97.8 F (36.6 C) (Oral)  SpO2 95% Wt Readings from Last 3 Encounters:  01/24/13 230 lb (104.327 kg)  01/24/13 230 lb (104.327 kg)  12/20/12 233 lb (105.688 kg)   Constitutional: Obese; appears well-developed and well-nourished. No distress.   Neck: Thick. Normal range of motion. Neck supple. No JVD present. No thyromegaly present.  Chest wall: tender to palpation over anterior upper rib cage on left and right axillary line over ribs. Cardiovascular: Normal rate, regular rhythm and normal heart sounds.  No murmur heard.  Chronic 1+ BLE edema Pulmonary/Chest: Effort normal and breath sounds normal. No respiratory distress. no wheezes.  Skin:  RLE wrapped and not examined today -L knee with improvement in chronic stage 1 ulceration over patella, no eschar or infection Psychiatric: he has a dysphoric mood and affect. behavior is normal. Judgment and thought content normal.   Lab Results  Component Value Date   WBC 12.6* 02/14/2013   HGB 14.3 02/14/2013   HCT 43.7 02/14/2013   PLT 598* 02/14/2013   CHOL 133 12/20/2012   TRIG 71.0 12/20/2012   HDL 44.10 12/20/2012   ALT 20 01/17/2013   AST 16 01/17/2013   NA 139 01/17/2013   K 4.7 01/17/2013   CL 108* 08/04/2012   CREATININE 0.8 01/17/2013   BUN 12.1 01/17/2013   CO2 24 01/17/2013   TSH 2.01 07/30/2009   HGBA1C 6.0 12/20/2012       Assessment & Plan:   see problem list. Medications and labs reviewed today.  Rib pain: left anterior and right axillary line - fall 02/07/13 reviewed Check DG rib detail r/o fx Continue pain mgmt as chronically ongoing  Peripheral neuropathy - add gabapentin and follow up on spine eval as referred for LBP

## 2013-02-21 NOTE — Telephone Encounter (Signed)
Pt stated he is having car trouble and is at auto shop now. Call forwarded to scheduler and POF put in.

## 2013-02-28 ENCOUNTER — Other Ambulatory Visit: Payer: Self-pay

## 2013-02-28 ENCOUNTER — Ambulatory Visit: Payer: Medicare Other

## 2013-02-28 ENCOUNTER — Telehealth: Payer: Self-pay | Admitting: *Deleted

## 2013-02-28 ENCOUNTER — Ambulatory Visit: Payer: Medicare Other | Admitting: Internal Medicine

## 2013-02-28 ENCOUNTER — Telehealth: Payer: Self-pay | Admitting: Internal Medicine

## 2013-02-28 ENCOUNTER — Other Ambulatory Visit: Payer: Medicare Other

## 2013-02-28 NOTE — Telephone Encounter (Signed)
lmonvm of the pt regarding his r/s appts from 12/17 to 03/02/2013 with adrena at 11:15am for lab,md and phlebotomy. Sent mixhelle a staff message to make the change in the phlebotomy appt.

## 2013-02-28 NOTE — Telephone Encounter (Signed)
Per staff message and POF I have scheduled appts.  JMW  

## 2013-03-01 ENCOUNTER — Other Ambulatory Visit: Payer: Self-pay | Admitting: Internal Medicine

## 2013-03-01 NOTE — Telephone Encounter (Signed)
Faxed script back to [facility]...lmb 

## 2013-03-02 ENCOUNTER — Ambulatory Visit (HOSPITAL_BASED_OUTPATIENT_CLINIC_OR_DEPARTMENT_OTHER): Payer: Medicare Other | Admitting: Physician Assistant

## 2013-03-02 ENCOUNTER — Other Ambulatory Visit (HOSPITAL_BASED_OUTPATIENT_CLINIC_OR_DEPARTMENT_OTHER): Payer: Medicare Other

## 2013-03-02 ENCOUNTER — Telehealth: Payer: Self-pay | Admitting: Internal Medicine

## 2013-03-02 ENCOUNTER — Encounter: Payer: Self-pay | Admitting: Physician Assistant

## 2013-03-02 VITALS — BP 139/82 | HR 88 | Temp 97.6°F | Resp 19 | Ht 65.0 in | Wt 238.1 lb

## 2013-03-02 DIAGNOSIS — D751 Secondary polycythemia: Secondary | ICD-10-CM

## 2013-03-02 DIAGNOSIS — D45 Polycythemia vera: Secondary | ICD-10-CM

## 2013-03-02 DIAGNOSIS — L97909 Non-pressure chronic ulcer of unspecified part of unspecified lower leg with unspecified severity: Secondary | ICD-10-CM

## 2013-03-02 LAB — CBC WITH DIFFERENTIAL/PLATELET
BASO%: 0.3 % (ref 0.0–2.0)
EOS%: 4.9 % (ref 0.0–7.0)
Eosinophils Absolute: 0.5 10*3/uL (ref 0.0–0.5)
LYMPH%: 10.9 % — ABNORMAL LOW (ref 14.0–49.0)
MCH: 30.6 pg (ref 27.2–33.4)
MCHC: 32.5 g/dL (ref 32.0–36.0)
MCV: 94.2 fL (ref 79.3–98.0)
MONO#: 0.2 10*3/uL (ref 0.1–0.9)
MONO%: 2.2 % (ref 0.0–14.0)
NEUT#: 9 10*3/uL — ABNORMAL HIGH (ref 1.5–6.5)
Platelets: 424 10*3/uL — ABNORMAL HIGH (ref 140–400)
RBC: 4.28 10*6/uL (ref 4.20–5.82)
RDW: 16 % — ABNORMAL HIGH (ref 11.0–14.6)
nRBC: 0 % (ref 0–0)

## 2013-03-02 LAB — COMPREHENSIVE METABOLIC PANEL (CC13)
ALT: 13 U/L (ref 0–55)
Albumin: 3.1 g/dL — ABNORMAL LOW (ref 3.5–5.0)
Alkaline Phosphatase: 134 U/L (ref 40–150)
Anion Gap: 8 mEq/L (ref 3–11)
CO2: 22 mEq/L (ref 22–29)
Creatinine: 0.7 mg/dL (ref 0.7–1.3)
Glucose: 111 mg/dl (ref 70–140)
Potassium: 4.2 mEq/L (ref 3.5–5.1)
Total Bilirubin: 0.45 mg/dL (ref 0.20–1.20)

## 2013-03-02 NOTE — Patient Instructions (Signed)
Continue your of weekly care at the wound care Center regarding a right lower for many ulcers. Continue taking the 81 mg aspirin and anagrelide at the current dose We'll check labs in one month he will followup with Dr. Rosie Fate in 2 months also with repeat labs

## 2013-03-02 NOTE — Telephone Encounter (Signed)
Gave pt appt for lab and MD for January and February 2015 °

## 2013-03-02 NOTE — Progress Notes (Signed)
[facility] OFFICE PROGRESS NOTE  [name], MD [address] [facility] [address] [place] [address]  DIAGNOSIS: POLYCYTHEMIA - Plan: CBC with Differential, CBC with Differential, Comprehensive metabolic panel (Cmet) - [facility], Lactate dehydrogenase (LDH) - [facility]  No chief complaint on file.   CURRENT THERAPY:  Hydrea 500 mg alternating with 1000 mg (stopped on 12/20/12) due to severe leg ulcers.  Anagrelide 0.5 mg bid prescribed.  Aspirin 81 mg daily.   INTERVAL HISTORY: Fernando Lane 62 y.o. male with a history of polycythemia vera presents for followup. He was last seen by Dr. Rosie Fate  on 01/24/2013. Since 01/18/2012,  he has been having ongoing skin infections to his right lower extremity and being followed by wound center reports that he has been on multiple antibiotics including ampicillin, doxycycline, clindamycin without resolution. During this time he reports he is always taking Hydrea. It was felt that the Hydrea was the cause of lower extremity right lower extremity ulcerations. His Hydrea therapy was stopped patient was started on anagrelide and 81 mg aspirin. He is tolerating this new medication regime without difficulty. He has noted significant improved healing in the right lower extremity ulcer since discontinuing the Hydrea. He remains afebrile he denies any chills. Also denies mucositis or any GI complaints such as diarrhea. His last phlebotomy was 4 months ago. He also reports that he had a lower extremity vascular study done that was reported to be within normal limits.   Today, he continues to reports significant improvement in his leg ulcers since stopping hydrea.  The areas have healed about 75 -80% per the patient.  He is tolerating the anagrelide with out difficulty.   He denies any chest pain, acute shortness of breath.   MEDICAL HISTORY: Past Medical History  Diagnosis Date  . Chronic low back pain   . Myeloproliferative disorder    Polycythemia; managed by heme-taking hydroxyurera  . Chronic neck pain   . OSA (obstructive sleep apnea)     CPAP @ bedtime  . Allergy   . Unspecified essential hypertension     Resistant, 2D Echo - EF-55-60  . Polycythemia   . CAD (coronary artery disease)     Vessel type unspecified  . PVD (peripheral vascular disease)   . DDD (degenerative disc disease), cervical     INTERIM HISTORY: has HYPERLIPIDEMIA-MIXED; MORBID OBESITY; POLYCYTHEMIA; OBSTRUCTIVE SLEEP APNEA; HYPERTENSION; CORONARY ARTERY DISEASE, VESSEL TYPE UNSPECIFIED; PERIPHERAL VASCULAR DISEASE; ERECTILE DYSFUNCTION, ORGANIC; DEGENERATIVE DISC DISEASE; LOW BACK PAIN; ABRASION, KNEE, LEFT; Depression; and Leg ulcer on his problem list.    ALLERGIES:  has No Known Allergies.  MEDICATIONS: has a current medication list which includes the following prescription(s): anagrelide, aspirin, cyclobenzaprine, gabapentin, hydrocodone-acetaminophen, lisinopril-hydrochlorothiazide, meclizine, metoprolol succinate, mupirocin ointment, naproxen, pravastatin, and tramadol.  SURGICAL HISTORY:  Past Surgical History  Procedure Laterality Date  . Lower extrmity doppler      [place] 2008; no evidence for PAD  . US echocardiography  11/2008    Normal valves, mild LAE  . Left knee replacement  10/2006  . Cervical spine surgery  02/2008  . Joint replacement    . Spine surgery      REVIEW OF SYSTEMS:   Constitutional: Denies fevers, chills or abnormal weight loss Eyes: Denies blurriness of vision Ears, nose, mouth, throat, and face: Denies mucositis or sore throat Respiratory: Denies cough, dyspnea or wheezes Cardiovascular: Denies palpitation, chest discomfort or lower extremity swelling Gastrointestinal:  Denies nausea, heartburn or change  in bowel habits Skin: Denies abnormal skin rashes Lymphatics: Denies new lymphadenopathy or easy bruising Neurological:Denies numbness, tingling or new weaknesses Behavioral/Psych: Mood is stable, no new  changes  All other systems were reviewed with the patient and are negative.  PHYSICAL EXAMINATION: ECOG PERFORMANCE STATUS: 1 - Symptomatic but completely ambulatory  Blood pressure 139/82, pulse 88, temperature 97.6 F (36.4 C), temperature source Oral, resp. rate 19, height 5\' 5"  (1.651 m), weight 238 lb 1.6 oz (108.001 kg).  GENERAL:alert, no distress and comfortable; moderately obese gentleman SKIN: skin color, texture, turgor are normal, no rashes or significant lesions EYES: normal, Conjunctiva are pink and non-injected, sclera clear OROPHARYNX:no exudate, no erythema and lips, buccal mucosa, and tongue normal  NECK: supple, thyroid normal size, non-tender, without nodularity LYMPH:  no palpable lymphadenopathy in the cervical, axillary or supraclavicular LUNGS: clear to auscultation and percussion with normal breathing effort HEART: regular rate & rhythm and no murmurs and no lower extremity edema ABDOMEN:abdomen soft, non-tender and normal bowel sounds Musculoskeletal:no cyanosis of digits and no clubbing  NEURO: alert & oriented x 3 with fluent speech, no focal motor/sensory deficits EXTREMITIES: Unna boot in place on right lower extremity, unable to visualize ulcers.  LABORATORY DATA: Results for orders placed in visit on 03/02/13 (from the past 48 hour(s))  CBC WITH DIFFERENTIAL     Status: Abnormal   Collection Time    03/02/13 10:54 AM      Result Value Range   WBC 11.0 (*) 4.0 - 10.3 10e3/uL   NEUT# 9.0 (*) 1.5 - 6.5 10e3/uL   HGB 13.1  13.0 - 17.1 g/dL   HCT 81.1  91.4 - 78.2 %   Platelets 424 (*) 140 - 400 10e3/uL   MCV 94.2  79.3 - 98.0 fL   MCH 30.6  27.2 - 33.4 pg   MCHC 32.5  32.0 - 36.0 g/dL   RBC 9.56  2.13 - 0.86 10e6/uL   RDW 16.0 (*) 11.0 - 14.6 %   lymph# 1.2  0.9 - 3.3 10e3/uL   MONO# 0.2  0.1 - 0.9 10e3/uL   Eosinophils Absolute 0.5  0.0 - 0.5 10e3/uL   Basophils Absolute 0.0  0.0 - 0.1 10e3/uL   NEUT% 81.7 (*) 39.0 - 75.0 %   LYMPH% 10.9 (*)  14.0 - 49.0 %   MONO% 2.2  0.0 - 14.0 %   EOS% 4.9  0.0 - 7.0 %   BASO% 0.3  0.0 - 2.0 %   nRBC 0  0 - 0 %  COMPREHENSIVE METABOLIC PANEL (CC13)     Status: Abnormal   Collection Time    03/02/13 10:55 AM      Result Value Range   Sodium 136  136 - 145 mEq/L   Potassium 4.2  3.5 - 5.1 mEq/L   Chloride 107  98 - 109 mEq/L   CO2 22  22 - 29 mEq/L   Glucose 111  70 - 140 mg/dl   BUN 57.8  7.0 - 46.9 mg/dL   Creatinine 0.7  0.7 - 1.3 mg/dL   Total Bilirubin 6.29  0.20 - 1.20 mg/dL   Alkaline Phosphatase 134  40 - 150 U/L   AST 12  5 - 34 U/L   ALT 13  0 - 55 U/L   Total Protein 6.9  6.4 - 8.3 g/dL   Albumin 3.1 (*) 3.5 - 5.0 g/dL   Calcium 9.7  8.4 - 52.8 mg/dL   Anion Gap 8  3 -  11 mEq/L       Labs:  Lab Results  Component Value Date   WBC 11.0* 03/02/2013   HGB 13.1 03/02/2013   HCT 40.3 03/02/2013   MCV 94.2 03/02/2013   PLT 424* 03/02/2013   NEUTROABS 9.0* 03/02/2013      Chemistry      Component Value Date/Time   NA 136 03/02/2013 1055   NA 135 06/02/2011 1020   K 4.2 03/02/2013 1055   K 5.4* 06/02/2011 1020   CL 108* 08/04/2012 1302   CL 100 06/02/2011 1020   CO2 22 03/02/2013 1055   CO2 26 06/02/2011 1020   BUN 13.8 03/02/2013 1055   BUN 12 06/02/2011 1020   CREATININE 0.7 03/02/2013 1055   CREATININE 0.87 06/02/2011 1020      Component Value Date/Time   CALCIUM 9.7 03/02/2013 1055   CALCIUM 9.2 06/02/2011 1020   ALKPHOS 134 03/02/2013 1055   ALKPHOS 55 08/02/2011 0919   AST 12 03/02/2013 1055   AST 23 08/02/2011 0919   ALT 13 03/02/2013 1055   ALT 34 08/02/2011 0919   BILITOT 0.45 03/02/2013 1055   BILITOT 0.9 08/02/2011 0919      CBC:  Recent Labs Lab 03/02/13 1054  WBC 11.0*  NEUTROABS 9.0*  HGB 13.1  HCT 40.3  MCV 94.2  PLT 424*   No results found for this basename: VITAMINB12, FOLATE, FERRITIN, TIBC, IRON, RETICCTPCT,  in the last 72 hours  RADIOGRAPHIC STUDIES: No results found.  ASSESSMENT: MATHIS CASHMAN 62 y.o. male with a history  of POLYCYTHEMIA - Plan: CBC with Differential, CBC with Differential, Comprehensive metabolic panel (Cmet) - [facility], Lactate dehydrogenase (LDH) - [facility]   PLAN:  1. Polycythemia vera, moderately elevated plts --He will continue phlebotomy to maintain hematocrit less than 45% and his daily aspirin 81 mg.  He was advised  on symptoms of polycythemia including blood clots, strokes, myocardial infarction. He will continue on the angrelide at the current dose. We'll check a CBC differential in one month and have him followup with Dr. Rosie Fate in 2 months with a repeat CBC differential, C. met and LDH.  He was informed of symptoms on elevated plts can results in clots and some cases serious bleeding.  Patient voiced understanding. His hematocrit is 40.3% today and he will not require phlebotomy.  2. Multiple chronic R leg ulcers. --   Some patients on hydroxyurea for greater than 5 years had developed chronic leg ulcers despite no underlying vascular disease (Arch Dermatology. 1999 Jul; 135(7):818-20.  In this retrospective multi-institutional study,  41 patients developed chronic leg ulcers while on Hydrea,  80% of them had complete resolution upon discontinuation of hydroxyurea.  As a result, we stopped his  Hydrea. Today, he notes substantial improvement and overall increased healing to the affected sites.  We will continue to support wound care to the effected areas. He is currently seen weekly at the wound care Center for his right lower extremity ulcers. We will continue close monitoring for #1 with phlebotomy to maintain a hematocrit less than 45% and aspirin 81 mg daily and anagrelide.    All questions were answered. The patient knows to call the clinic with any problems, questions or concerns. We can certainly see the patient much sooner if necessary. He was provided a handout on polycythemia and Hydrea-- which we are now discontinuing. He was also provided and after visit summary. He will followup in one  month for symptom visit.   I spent  20 minutes counseling the patient face to face. The total time spent in the appointment was 30 minutes.    Conni Slipper, PA-C 03/02/2013 3:25 PM

## 2013-03-07 ENCOUNTER — Telehealth: Payer: Self-pay | Admitting: Internal Medicine

## 2013-03-07 NOTE — Telephone Encounter (Signed)
Pt called and r/s all apptys to Wednesday labs and ML visit for january and February 2015

## 2013-03-16 ENCOUNTER — Encounter (HOSPITAL_BASED_OUTPATIENT_CLINIC_OR_DEPARTMENT_OTHER): Payer: Medicare Other | Attending: General Surgery

## 2013-03-16 DIAGNOSIS — I87339 Chronic venous hypertension (idiopathic) with ulcer and inflammation of unspecified lower extremity: Secondary | ICD-10-CM | POA: Insufficient documentation

## 2013-03-16 DIAGNOSIS — L97909 Non-pressure chronic ulcer of unspecified part of unspecified lower leg with unspecified severity: Principal | ICD-10-CM

## 2013-03-20 ENCOUNTER — Telehealth: Payer: Self-pay | Admitting: *Deleted

## 2013-03-20 MED ORDER — HYDROCODONE-ACETAMINOPHEN 7.5-325 MG PO TABS: 1.0000 | ORAL_TABLET | Freq: Three times a day (TID) | ORAL | Status: DC | PRN

## 2013-03-20 NOTE — Telephone Encounter (Signed)
Notified pt rx ready for pick-up.../lmb 

## 2013-03-20 NOTE — Telephone Encounter (Signed)
Patient phoned requesting Rx refill for hydrocodone (on the 12th).  States he visits the cancer center weekly (Wednesdays)....even though he knows he cannot fill the script until the 12th, tomorrow will his only day that he will be convenient to our office to pick up script.  Please advise.  CB# [phone]

## 2013-03-20 NOTE — Telephone Encounter (Signed)
rx printed and signed

## 2013-03-22 ENCOUNTER — Other Ambulatory Visit: Payer: Self-pay | Admitting: Internal Medicine

## 2013-03-23 NOTE — Telephone Encounter (Signed)
Okay for refill?  

## 2013-03-27 ENCOUNTER — Other Ambulatory Visit: Payer: Self-pay | Admitting: Internal Medicine

## 2013-03-27 NOTE — Telephone Encounter (Signed)
Ok to refill? Last OV 10.8.14 Last filled 12.18.14

## 2013-03-28 ENCOUNTER — Other Ambulatory Visit: Payer: Medicare Other

## 2013-03-30 ENCOUNTER — Other Ambulatory Visit: Payer: Medicare Other

## 2013-04-04 ENCOUNTER — Other Ambulatory Visit (HOSPITAL_BASED_OUTPATIENT_CLINIC_OR_DEPARTMENT_OTHER): Payer: Medicare Other

## 2013-04-04 DIAGNOSIS — D45 Polycythemia vera: Secondary | ICD-10-CM

## 2013-04-04 DIAGNOSIS — D751 Secondary polycythemia: Secondary | ICD-10-CM

## 2013-04-04 LAB — CBC WITH DIFFERENTIAL/PLATELET
BASO%: 0.2 % (ref 0.0–2.0)
Basophils Absolute: 0 10*3/uL (ref 0.0–0.1)
EOS ABS: 0.8 10*3/uL — AB (ref 0.0–0.5)
EOS%: 6 % (ref 0.0–7.0)
HCT: 46.9 % (ref 38.4–49.9)
HGB: 15.1 g/dL (ref 13.0–17.1)
LYMPH%: 11.7 % — AB (ref 14.0–49.0)
MCH: 28.9 pg (ref 27.2–33.4)
MCHC: 32.2 g/dL (ref 32.0–36.0)
MCV: 89.7 fL (ref 79.3–98.0)
MONO#: 0.2 10*3/uL (ref 0.1–0.9)
MONO%: 1.5 % (ref 0.0–14.0)
NEUT%: 80.6 % — ABNORMAL HIGH (ref 39.0–75.0)
NEUTROS ABS: 10.4 10*3/uL — AB (ref 1.5–6.5)
NRBC: 0 % (ref 0–0)
Platelets: 459 10*3/uL — ABNORMAL HIGH (ref 140–400)
RBC: 5.23 10*6/uL (ref 4.20–5.82)
RDW: 16.1 % — AB (ref 11.0–14.6)
WBC: 12.9 10*3/uL — ABNORMAL HIGH (ref 4.0–10.3)
lymph#: 1.5 10*3/uL (ref 0.9–3.3)

## 2013-04-18 ENCOUNTER — Encounter (HOSPITAL_BASED_OUTPATIENT_CLINIC_OR_DEPARTMENT_OTHER): Payer: Medicare Other | Attending: General Surgery

## 2013-04-18 DIAGNOSIS — L97809 Non-pressure chronic ulcer of other part of unspecified lower leg with unspecified severity: Secondary | ICD-10-CM | POA: Insufficient documentation

## 2013-04-19 ENCOUNTER — Telehealth: Payer: Self-pay | Admitting: *Deleted

## 2013-04-19 DIAGNOSIS — H579 Unspecified disorder of eye and adnexa: Secondary | ICD-10-CM

## 2013-04-19 MED ORDER — HYDROCODONE-ACETAMINOPHEN 7.5-325 MG PO TABS: 1.0000 | ORAL_TABLET | Freq: Three times a day (TID) | ORAL | Status: DC | PRN

## 2013-04-19 NOTE — Telephone Encounter (Signed)
Refill printed and signed, please enroll with [facility] prior to dispensing prescription for pickup Referral to ophthalmology ordered as requested

## 2013-04-19 NOTE — Telephone Encounter (Signed)
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 Please advise  CB# [phone]

## 2013-04-20 ENCOUNTER — Other Ambulatory Visit: Payer: Self-pay | Admitting: Internal Medicine

## 2013-04-20 NOTE — Telephone Encounter (Signed)
Called pt no answer LMOM with md response.../lmb 

## 2013-04-24 ENCOUNTER — Other Ambulatory Visit: Payer: Self-pay | Admitting: Internal Medicine

## 2013-04-24 NOTE — Telephone Encounter (Signed)
Faxed script back to [place]...lmb 

## 2013-04-25 ENCOUNTER — Encounter: Payer: Self-pay | Admitting: Internal Medicine

## 2013-05-01 ENCOUNTER — Telehealth: Payer: Self-pay | Admitting: Internal Medicine

## 2013-05-01 NOTE — Telephone Encounter (Signed)
, °

## 2013-05-02 ENCOUNTER — Other Ambulatory Visit: Payer: Medicare Other

## 2013-05-02 ENCOUNTER — Ambulatory Visit: Payer: Medicare Other

## 2013-05-03 ENCOUNTER — Ambulatory Visit: Payer: Medicare Other

## 2013-05-03 ENCOUNTER — Other Ambulatory Visit: Payer: Medicare Other

## 2013-05-09 ENCOUNTER — Other Ambulatory Visit: Payer: Medicare Other

## 2013-05-11 ENCOUNTER — Telehealth: Payer: Self-pay | Admitting: *Deleted

## 2013-05-11 NOTE — Telephone Encounter (Signed)
Left msg on vm requesting refill on his hydrocodone. Refill not due until the 11th...Fernando Lane

## 2013-05-14 NOTE — Telephone Encounter (Signed)
Refill per protocol only... If Not yet due, no refill yet

## 2013-05-14 NOTE — Telephone Encounter (Signed)
Notified pt with md response. Inform pt will resned request on 05/21/13 whn md return since refill is not due till Wednesday 05/23/13...Johny Chess

## 2013-05-16 ENCOUNTER — Encounter (HOSPITAL_BASED_OUTPATIENT_CLINIC_OR_DEPARTMENT_OTHER): Payer: Medicare Other | Attending: General Surgery

## 2013-05-16 DIAGNOSIS — L089 Local infection of the skin and subcutaneous tissue, unspecified: Secondary | ICD-10-CM | POA: Insufficient documentation

## 2013-05-16 DIAGNOSIS — L97809 Non-pressure chronic ulcer of other part of unspecified lower leg with unspecified severity: Secondary | ICD-10-CM | POA: Insufficient documentation

## 2013-05-18 ENCOUNTER — Encounter: Payer: Self-pay | Admitting: Internal Medicine

## 2013-05-21 ENCOUNTER — Other Ambulatory Visit: Payer: Self-pay | Admitting: Internal Medicine

## 2013-05-21 ENCOUNTER — Telehealth: Payer: Self-pay | Admitting: *Deleted

## 2013-05-21 MED ORDER — TRAMADOL HCL 50 MG PO TABS: ORAL_TABLET | ORAL | Status: DC

## 2013-05-21 MED ORDER — HYDROCODONE-ACETAMINOPHEN 7.5-325 MG PO TABS: 1.0000 | ORAL_TABLET | Freq: Three times a day (TID) | ORAL | Status: DC | PRN

## 2013-05-21 NOTE — Telephone Encounter (Signed)
Requesting refill on his hydrocodone, and also wanting handi-capp form fill-out...Fernando Lane

## 2013-05-21 NOTE — Telephone Encounter (Signed)
Called pt no answer LMOM rx & handi-capp form ready for pick-up...Fernando Lane

## 2013-05-21 NOTE — Telephone Encounter (Signed)
[place] for both - I will sign each thanks

## 2013-05-21 NOTE — Telephone Encounter (Signed)
Faxed tramadol back to [place]...Fernando Lane

## 2013-05-23 ENCOUNTER — Ambulatory Visit: Payer: Medicare Other

## 2013-05-23 ENCOUNTER — Other Ambulatory Visit: Payer: Medicare Other

## 2013-05-24 ENCOUNTER — Telehealth: Payer: Self-pay | Admitting: Internal Medicine

## 2013-05-24 NOTE — Telephone Encounter (Signed)
pt called to r/s missed appt....pt aware of d.t of new appt

## 2013-06-08 ENCOUNTER — Encounter: Payer: Self-pay | Admitting: Surgery

## 2013-06-13 ENCOUNTER — Encounter: Payer: Self-pay | Admitting: Surgery

## 2013-06-15 ENCOUNTER — Encounter: Payer: Self-pay | Admitting: Surgery

## 2013-06-15 LAB — WOUND AEROBIC CULTURE

## 2013-06-18 ENCOUNTER — Telehealth: Payer: Self-pay | Admitting: Internal Medicine

## 2013-06-18 NOTE — Telephone Encounter (Signed)
Patient is requesting script on hydrocodone.

## 2013-06-18 NOTE — Telephone Encounter (Signed)
Not due until 06/21/13 Will sign and for pick up on or after that date  note need for screening with [facility] prior to picking up refill (if not already done)

## 2013-06-19 MED ORDER — HYDROCODONE-ACETAMINOPHEN 7.5-325 MG PO TABS: 1.0000 | ORAL_TABLET | Freq: Three times a day (TID) | ORAL | Status: DC | PRN

## 2013-06-19 MED ORDER — HYDROCODONE-ACETAMINOPHEN 7.5-325 MG PO TABS: ORAL_TABLET | ORAL | Status: DC

## 2013-06-20 ENCOUNTER — Ambulatory Visit: Payer: Medicare Other | Admitting: Internal Medicine

## 2013-06-25 ENCOUNTER — Telehealth: Payer: Self-pay | Admitting: Internal Medicine

## 2013-06-25 MED ORDER — METOPROLOL SUCCINATE ER 25 MG PO TB24: 25.0000 mg | ORAL_TABLET | Freq: Every day | ORAL | Status: DC

## 2013-06-25 MED ORDER — MECLIZINE HCL 12.5 MG PO TABS: 12.5000 mg | ORAL_TABLET | ORAL | Status: DC | PRN

## 2013-06-25 MED ORDER — LISINOPRIL-HYDROCHLOROTHIAZIDE 20-12.5 MG PO TABS: 1.0000 | ORAL_TABLET | Freq: Every day | ORAL | Status: DC

## 2013-06-25 NOTE — Telephone Encounter (Signed)
Refill done.  

## 2013-06-25 NOTE — Telephone Encounter (Signed)
Benjie Karvonen with [facility] is calling on behalf of the patient to request a refill on his meclizine (ANTIVERT) 12.5 MG tablet, lisinopril-hydrochlorothiazide (PRINZIDE,ZESTORETIC) 20-12.5 MG and metoprolol succinate (TOPROL-XL) 25 MG. Please follow up.

## 2013-06-27 NOTE — Telephone Encounter (Signed)
Let pt know that because he has had no hydrocodone present in his UDS 3/6 and again 4/10, I will no longer be rx'ing hydrocodone for him i can make refer to pain mgmt if desired thanks

## 2013-06-29 NOTE — Telephone Encounter (Signed)
Called pt no answer LMOM with md response.../lmb 

## 2013-07-02 ENCOUNTER — Ambulatory Visit: Payer: Medicare Other | Admitting: Internal Medicine

## 2013-07-02 DIAGNOSIS — Z0289 Encounter for other administrative examinations: Secondary | ICD-10-CM

## 2013-07-04 ENCOUNTER — Other Ambulatory Visit: Payer: Medicare Other

## 2013-07-04 ENCOUNTER — Telehealth: Payer: Self-pay | Admitting: Internal Medicine

## 2013-07-04 ENCOUNTER — Ambulatory Visit: Payer: Medicare Other

## 2013-07-04 NOTE — Telephone Encounter (Signed)
returned pt call to r/s appt due to recent fall r/s to dif d.t  pt aware of new d.t.Marland KitchenMarland KitchenMarland Kitchen

## 2013-07-13 ENCOUNTER — Encounter: Payer: Self-pay | Admitting: Surgery

## 2013-07-16 ENCOUNTER — Encounter: Payer: Self-pay | Admitting: Internal Medicine

## 2013-07-25 ENCOUNTER — Ambulatory Visit: Payer: Medicare Other

## 2013-07-25 ENCOUNTER — Other Ambulatory Visit: Payer: Medicare Other

## 2013-08-07 ENCOUNTER — Encounter (HOSPITAL_COMMUNITY): Payer: Self-pay | Admitting: Emergency Medicine

## 2013-08-07 ENCOUNTER — Inpatient Hospital Stay (HOSPITAL_COMMUNITY)
Admission: EM | Admit: 2013-08-07 | Discharge: 2013-08-17 | DRG: 280 | Disposition: A | Discharge status: Skilled Nursing Facility | Payer: Medicare Other | Attending: Internal Medicine | Admitting: Internal Medicine

## 2013-08-07 ENCOUNTER — Emergency Department (HOSPITAL_COMMUNITY): Payer: Medicare Other

## 2013-08-07 DIAGNOSIS — E785 Hyperlipidemia, unspecified: Secondary | ICD-10-CM

## 2013-08-07 DIAGNOSIS — R05 Cough: Secondary | ICD-10-CM

## 2013-08-07 DIAGNOSIS — I213 ST elevation (STEMI) myocardial infarction of unspecified site: Secondary | ICD-10-CM

## 2013-08-07 DIAGNOSIS — E782 Mixed hyperlipidemia: Secondary | ICD-10-CM | POA: Diagnosis present

## 2013-08-07 DIAGNOSIS — Z96659 Presence of unspecified artificial knee joint: Secondary | ICD-10-CM

## 2013-08-07 DIAGNOSIS — M545 Low back pain, unspecified: Secondary | ICD-10-CM

## 2013-08-07 DIAGNOSIS — G4733 Obstructive sleep apnea (adult) (pediatric): Secondary | ICD-10-CM

## 2013-08-07 DIAGNOSIS — I2119 ST elevation (STEMI) myocardial infarction involving other coronary artery of inferior wall: Principal | ICD-10-CM | POA: Diagnosis present

## 2013-08-07 DIAGNOSIS — N529 Male erectile dysfunction, unspecified: Secondary | ICD-10-CM

## 2013-08-07 DIAGNOSIS — Z87891 Personal history of nicotine dependence: Secondary | ICD-10-CM

## 2013-08-07 DIAGNOSIS — I319 Disease of pericardium, unspecified: Secondary | ICD-10-CM | POA: Diagnosis present

## 2013-08-07 DIAGNOSIS — G589 Mononeuropathy, unspecified: Secondary | ICD-10-CM | POA: Diagnosis present

## 2013-08-07 DIAGNOSIS — IMO0002 Reserved for concepts with insufficient information to code with codable children: Secondary | ICD-10-CM

## 2013-08-07 DIAGNOSIS — I999 Unspecified disorder of circulatory system: Secondary | ICD-10-CM

## 2013-08-07 DIAGNOSIS — D72829 Elevated white blood cell count, unspecified: Secondary | ICD-10-CM

## 2013-08-07 DIAGNOSIS — F329 Major depressive disorder, single episode, unspecified: Secondary | ICD-10-CM

## 2013-08-07 DIAGNOSIS — R059 Cough, unspecified: Secondary | ICD-10-CM

## 2013-08-07 DIAGNOSIS — Z79899 Other long term (current) drug therapy: Secondary | ICD-10-CM

## 2013-08-07 DIAGNOSIS — G8929 Other chronic pain: Secondary | ICD-10-CM | POA: Diagnosis present

## 2013-08-07 DIAGNOSIS — I219 Acute myocardial infarction, unspecified: Secondary | ICD-10-CM

## 2013-08-07 DIAGNOSIS — I313 Pericardial effusion (noninflammatory): Secondary | ICD-10-CM

## 2013-08-07 DIAGNOSIS — I2510 Atherosclerotic heart disease of native coronary artery without angina pectoris: Secondary | ICD-10-CM

## 2013-08-07 DIAGNOSIS — I509 Heart failure, unspecified: Secondary | ICD-10-CM | POA: Diagnosis present

## 2013-08-07 DIAGNOSIS — I2589 Other forms of chronic ischemic heart disease: Secondary | ICD-10-CM | POA: Diagnosis present

## 2013-08-07 DIAGNOSIS — I10 Essential (primary) hypertension: Secondary | ICD-10-CM

## 2013-08-07 DIAGNOSIS — I739 Peripheral vascular disease, unspecified: Secondary | ICD-10-CM

## 2013-08-07 DIAGNOSIS — L98499 Non-pressure chronic ulcer of skin of other sites with unspecified severity: Secondary | ICD-10-CM

## 2013-08-07 DIAGNOSIS — I998 Other disorder of circulatory system: Secondary | ICD-10-CM

## 2013-08-07 DIAGNOSIS — L97909 Non-pressure chronic ulcer of unspecified part of unspecified lower leg with unspecified severity: Secondary | ICD-10-CM

## 2013-08-07 DIAGNOSIS — I314 Cardiac tamponade: Secondary | ICD-10-CM | POA: Diagnosis present

## 2013-08-07 DIAGNOSIS — Z993 Dependence on wheelchair: Secondary | ICD-10-CM

## 2013-08-07 DIAGNOSIS — I5021 Acute systolic (congestive) heart failure: Secondary | ICD-10-CM

## 2013-08-07 DIAGNOSIS — I3139 Other pericardial effusion (noninflammatory): Secondary | ICD-10-CM

## 2013-08-07 DIAGNOSIS — D751 Secondary polycythemia: Secondary | ICD-10-CM

## 2013-08-07 DIAGNOSIS — D45 Polycythemia vera: Secondary | ICD-10-CM | POA: Diagnosis present

## 2013-08-07 DIAGNOSIS — D47Z9 Other specified neoplasms of uncertain behavior of lymphoid, hematopoietic and related tissue: Secondary | ICD-10-CM | POA: Diagnosis present

## 2013-08-07 DIAGNOSIS — F32A Depression, unspecified: Secondary | ICD-10-CM

## 2013-08-07 LAB — COMPREHENSIVE METABOLIC PANEL
ALT: 14 U/L (ref 0–53)
AST: 20 U/L (ref 0–37)
Albumin: 3.1 g/dL — ABNORMAL LOW (ref 3.5–5.2)
Alkaline Phosphatase: 186 U/L — ABNORMAL HIGH (ref 39–117)
BILIRUBIN TOTAL: 1.4 mg/dL — AB (ref 0.3–1.2)
BUN: 18 mg/dL (ref 6–23)
CO2: 22 meq/L (ref 19–32)
Calcium: 9.1 mg/dL (ref 8.4–10.5)
Chloride: 105 mEq/L (ref 96–112)
Creatinine, Ser: 0.82 mg/dL (ref 0.50–1.35)
GLUCOSE: 111 mg/dL — AB (ref 70–99)
Potassium: 4.4 mEq/L (ref 3.7–5.3)
Sodium: 140 mEq/L (ref 137–147)
Total Protein: 6.8 g/dL (ref 6.0–8.3)

## 2013-08-07 LAB — URINALYSIS, ROUTINE W REFLEX MICROSCOPIC
Glucose, UA: NEGATIVE mg/dL
HGB URINE DIPSTICK: NEGATIVE
Ketones, ur: 15 mg/dL — AB
NITRITE: NEGATIVE
PROTEIN: 100 mg/dL — AB
Specific Gravity, Urine: 1.021 (ref 1.005–1.030)
Urobilinogen, UA: 4 mg/dL — ABNORMAL HIGH (ref 0.0–1.0)
pH: 5 (ref 5.0–8.0)

## 2013-08-07 LAB — CBC WITH DIFFERENTIAL/PLATELET
BASOS PCT: 0 % (ref 0–1)
Basophils Absolute: 0 10*3/uL (ref 0.0–0.1)
EOS PCT: 2 % (ref 0–5)
Eosinophils Absolute: 0.3 10*3/uL (ref 0.0–0.7)
HEMATOCRIT: 48.2 % (ref 39.0–52.0)
HEMOGLOBIN: 15.3 g/dL (ref 13.0–17.0)
LYMPHS PCT: 4 % — AB (ref 12–46)
Lymphs Abs: 0.6 10*3/uL — ABNORMAL LOW (ref 0.7–4.0)
MCH: 26.5 pg (ref 26.0–34.0)
MCHC: 31.7 g/dL (ref 30.0–36.0)
MCV: 83.5 fL (ref 78.0–100.0)
Monocytes Absolute: 0.6 10*3/uL (ref 0.1–1.0)
Monocytes Relative: 4 % (ref 3–12)
NEUTROS ABS: 14.7 10*3/uL — AB (ref 1.7–7.7)
Neutrophils Relative %: 90 % — ABNORMAL HIGH (ref 43–77)
Platelets: 410 10*3/uL — ABNORMAL HIGH (ref 150–400)
RBC: 5.77 MIL/uL (ref 4.22–5.81)
RDW: 18.8 % — ABNORMAL HIGH (ref 11.5–15.5)
WBC: 16.2 10*3/uL — ABNORMAL HIGH (ref 4.0–10.5)

## 2013-08-07 LAB — URINE MICROSCOPIC-ADD ON

## 2013-08-07 LAB — PRO B NATRIURETIC PEPTIDE: Pro B Natriuretic peptide (BNP): 13375 pg/mL — ABNORMAL HIGH (ref 0–125)

## 2013-08-07 LAB — I-STAT CG4 LACTIC ACID, ED: Lactic Acid, Venous: 1.21 mmol/L (ref 0.5–2.2)

## 2013-08-07 LAB — TROPONIN I: Troponin I: 1.9 ng/mL (ref <0.30)

## 2013-08-07 LAB — CK: Total CK: 63 U/L (ref 7–232)

## 2013-08-07 MED ORDER — ASPIRIN 325 MG PO TABS
325.0000 mg | ORAL_TABLET | Freq: Every day | ORAL | Status: DC
  Administered 2013-08-07 – 2013-08-15 (×9): 325 mg via ORAL
  Filled 2013-08-07 (×10): qty 1

## 2013-08-07 MED ORDER — MORPHINE SULFATE 4 MG/ML IJ SOLN
4.0000 mg | Freq: Once | INTRAMUSCULAR | Status: AC
  Administered 2013-08-07: 4 mg via INTRAVENOUS
  Filled 2013-08-07: qty 1

## 2013-08-07 MED ORDER — HEPARIN BOLUS VIA INFUSION
4000.0000 [IU] | Freq: Once | INTRAVENOUS | Status: AC
  Administered 2013-08-07: 4000 [IU] via INTRAVENOUS
  Filled 2013-08-07: qty 4000

## 2013-08-07 MED ORDER — FUROSEMIDE 10 MG/ML IJ SOLN
40.0000 mg | Freq: Once | INTRAMUSCULAR | Status: AC
  Administered 2013-08-07: 40 mg via INTRAVENOUS
  Filled 2013-08-07: qty 4

## 2013-08-07 MED ORDER — ONDANSETRON HCL 4 MG/2ML IJ SOLN
4.0000 mg | Freq: Once | INTRAMUSCULAR | Status: AC
  Administered 2013-08-07: 4 mg via INTRAVENOUS
  Filled 2013-08-07: qty 2

## 2013-08-07 MED ORDER — HEPARIN (PORCINE) IN NACL 100-0.45 UNIT/ML-% IJ SOLN
1450.0000 [IU]/h | INTRAMUSCULAR | Status: DC
  Administered 2013-08-07: 1200 [IU]/h via INTRAVENOUS
  Filled 2013-08-07 (×3): qty 250

## 2013-08-07 NOTE — ED Notes (Signed)
The patient is unable to give an urine specimen at this time. The patient has been advised to use call light for assistance. The tech has reported to the RN in charge. 

## 2013-08-07 NOTE — ED Notes (Signed)
Critical troponin called from lab.  Dr. Betsey Holiday made aware

## 2013-08-07 NOTE — Consult Note (Signed)
Referring Physician: ER Primary Physician: Asa Lente Primary Cardiologist: Aundra Dubin Reason for Consultation: Abnormal ECG and troponin   HPI:  63 y/o male with multiple medical problems including hyperlipidemia, hypertension, OSA,  polycythemia vera and LE ulcers/skin infection who we are asked to see for abnormal ECG and + troponin.   Denies h/o know CAD. Never had cath. Stress test in 2008 was low risk.  He had a 2-D echo in 2010 ejection fraction was 55-60% with normal valve mild left atrial enlargement  Has been struggling with skin infections and ulcers in RLE. On Friday had episode of chest and abdominal fullness followed by diarrhea that last many hours. Started to feel better on Saturday. Came to ER today due to worsening neuropathy and discoloration of RLE.  In ER ECG markedly abnormal with low volts. Anterolateral qs and persistent ST elevation. Denies CP or SOB currently. Trop 1.9. Ck total 63. BNP 13375   Review of Systems:     Cardiac Review of Systems: {Y] = yes [ ]  = no  Chest Pain [    ]  Resting SOB [   ] Exertional SOB  [  ]  Orthopnea [  ]   Pedal Edema [   ]    Palpitations [  ] Syncope  [  ]   Presyncope [   ]  General Review of Systems: [Y] = yes [  ]=no Constitional: recent weight change [  ]; anorexia [  ]; fatigue Blue.Reese  ]; nausea Blue.Reese  ]; night sweats [  ]; fever [  ]; or chills [  ];                                                                      Eyes : blurred vision [  ]; diplopia [   ]; vision changes [  ];  Amaurosis fugax[  ]; Resp: cough [  ];  wheezing[  ];  hemoptysis[  ];  PND [  ];  GI:  gallstones[  ], vomiting[  ];  dysphagia[  ]; melena[  ];  hematochezia [  ]; heartburn[  ];   GU: kidney stones [  ]; hematuria[  ];   dysuria [  ];  nocturia[  ]; incontinence [  ];             Skin: rash, swelling[  ];, hair loss[  ];  peripheral edema[y  ];  or itching[  ]; Musculosketetal: myalgias[  ];  joint swelling[  ];  joint erythema[  ];  joint pain[y   ];  back pain[ y ];  Heme/Lymph: bruising[  ];  bleeding[  ];  anemia[  ];  Neuro: TIA[  ];  headaches[  ];  stroke[  ];  vertigo[  ];  seizures[  ];   paresthesias[y  ];  difficulty walking[ y ];  Psych:depression[  ]; anxiety[  ];  Endocrine: diabetes[  ];  thyroid dysfunction[  ];  Other:  Past Medical History  Diagnosis Date  . Chronic low back pain   . 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 Chronic neck pain   . OSA (obstructive sleep apnea)     CPAP @ bedtime  .  Allergy   . Unspecified essential hypertension     Resistant, 2D Echo - EF-55-60  . Polycythemia   . CAD (coronary artery disease)     Vessel type unspecified  . PVD (peripheral vascular disease)   . DDD (degenerative disc disease), cervical     No Known Allergies  History   Social History  . Marital Status: Single    Spouse Name: N/A    Number of Children: 1  . Years of Education: N/A   Occupational History  .     Social History Main Topics  . Smoking status: Former Smoker -- 0 years    Types: Cigarettes, Pipe    Quit date: 03/15/1968  . Smokeless tobacco: Former Systems developer     Comment: Pt quit Cigarettes 1970's- and cigars & chew 1990  . Alcohol Use: 3.6 oz/week    6 Cans of beer per week  . Drug Use: No  . Sexual Activity: Not on file   Other Topics Concern  . Not on file   Social History Narrative   Divorced, lives alone. Daughter lives in town. Disable/Former management/construction    Family History  Problem Relation Age of Onset  . Heart attack Father     PHYSICAL EXAM: Filed Vitals:   08/07/13 1726  BP: 135/93  Pulse:   Temp:   Resp: 30    No intake or output data in the 24 hours ending 08/07/13 1759  General:  Chronically ill appearing. No respiratory difficulty HEENT: normal Neck: supple. Thick unable to see no JVD. Carotids 2+ bilat; no bruits. No lymphadenopathy or thryomegaly appreciated. Cor: PMI nondisplaced. Regular rate &  rhythm. Distant No rubs, gallops or murmurs. Lungs: crackles at bases  Abdomen: obese  soft, nontender, mildly distended. No hepatosplenomegaly. No bruits or masses. Good bowel sounds. Extremities: no cyanosis, clubbing, rash, 1+ edema R foot mottled weak pulse  Diffuse erythems Neuro: alert & oriented x 3, cranial nerves grossly intact. moves all 4 extremities w/o difficulty. Affect pleasant.  ECG: ST 105 low volts anterolateral and inferior Qs with persistent ST elevation (new since previous)  Results for orders placed during the hospital encounter of 08/07/13 (from the past 24 hour(s))  CBC WITH DIFFERENTIAL     Status: Abnormal   Collection Time    08/07/13  4:00 PM      Result Value Ref Range   WBC 16.2 (*) 4.0 - 10.5 K/uL   RBC 5.77  4.22 - 5.81 MIL/uL   Hemoglobin 15.3  13.0 - 17.0 g/dL   HCT 48.2  39.0 - 52.0 %   MCV 83.5  78.0 - 100.0 fL   MCH 26.5  26.0 - 34.0 pg   MCHC 31.7  30.0 - 36.0 g/dL   RDW 18.8 (*) 11.5 - 15.5 %   Platelets 410 (*) 150 - 400 K/uL   Neutrophils Relative % 90 (*) 43 - 77 %   Lymphocytes Relative 4 (*) 12 - 46 %   Monocytes Relative 4  3 - 12 %   Eosinophils Relative 2  0 - 5 %   Basophils Relative 0  0 - 1 %   Neutro Abs 14.7 (*) 1.7 - 7.7 K/uL   Lymphs Abs 0.6 (*) 0.7 - 4.0 K/uL   Monocytes Absolute 0.6  0.1 - 1.0 K/uL   Eosinophils Absolute 0.3  0.0 - 0.7 K/uL   Basophils Absolute 0.0  0.0 - 0.1 K/uL   WBC Morphology TOXIC GRANULATION     Smear  Review LARGE PLATELETS PRESENT    COMPREHENSIVE METABOLIC PANEL     Status: Abnormal   Collection Time    08/07/13  4:00 PM      Result Value Ref Range   Sodium 140  137 - 147 mEq/L   Potassium 4.4  3.7 - 5.3 mEq/L   Chloride 105  96 - 112 mEq/L   CO2 22  19 - 32 mEq/L   Glucose, Bld 111 (*) 70 - 99 mg/dL   BUN 18  6 - 23 mg/dL   Creatinine, Ser 0.82  0.50 - 1.35 mg/dL   Calcium 9.1  8.4 - 10.5 mg/dL   Total Protein 6.8  6.0 - 8.3 g/dL   Albumin 3.1 (*) 3.5 - 5.2 g/dL   AST 20  0 - 37 U/L    ALT 14  0 - 53 U/L   Alkaline Phosphatase 186 (*) 39 - 117 U/L   Total Bilirubin 1.4 (*) 0.3 - 1.2 mg/dL   GFR calc non Af Amer >90  >90 mL/min   GFR calc Af Amer >90  >90 mL/min  TROPONIN I     Status: Abnormal   Collection Time    08/07/13  4:00 PM      Result Value Ref Range   Troponin I 1.90 (*) <0.30 ng/mL  CK     Status: None   Collection Time    08/07/13  4:00 PM      Result Value Ref Range   Total CK 63  7 - 232 U/L  PRO B NATRIURETIC PEPTIDE     Status: Abnormal   Collection Time    08/07/13  4:00 PM      Result Value Ref Range   Pro B Natriuretic peptide (BNP) 13375.0 (*) 0 - 125 pg/mL  I-STAT CG4 LACTIC ACID, ED     Status: None   Collection Time    08/07/13  4:42 PM      Result Value Ref Range   Lactic Acid, Venous 1.21  0.5 - 2.2 mmol/L   No results found.   ASSESSMENT: 1) Probable out of hospital STEMI 2) Acute systolic HF 3) RLE ulcers/ischemia  PLAN/DISCUSSION:  It appears that Mr. Millan has had an out of hospital STEMI on Friday and has now completed his infarct. I suspect he has significant LV dysfunction. He has evidence of clinical HF on exam. He also appears to have worsening ischemia in RLE.  At this point there is no indication for emergent cardiac catheterization. Can admit to IM to help manage also his issue. Would recommend the following:  1) Admit to tele 2) Lasix 40 IV bid 3) Continue Toprol, statin, asa 4) Check echo  5) Vascular to evaluate RLE.   We will follow.   Shaune Pascal Ilah Boule,MD 6:15 PM
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
## 2013-08-07 NOTE — ED Notes (Addendum)
EMS-pt reports generalized weakness, urinary incontinence and poor PO intake since Friday. Pt with nonhealing ulcer to right lower leg. Wound was cleaned this am. CBG 114. Pt has been to weak to get up and walk around. Pt is alert and oriented x 4.
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
## 2013-08-07 NOTE — ED Notes (Signed)
Dr. Shanon Brow in to assess pt at this time

## 2013-08-07 NOTE — ED Notes (Signed)
CG-4 reported to Dr. Betsey Holiday

## 2013-08-07 NOTE — Progress Notes (Signed)
ANTICOAGULATION CONSULT NOTE - Initial Consult  Pharmacy Consult for heparin Indication: PAD  No Known Allergies  Patient Measurements: weight 110 kg, height 65 inches (per patient)   Heparin Dosing Weight: 87kg  Vital Signs: Temp: 98.9 F (37.2 C) (05/26 1602) Temp src: Oral (05/26 1602) BP: 135/93 mmHg (05/26 1726) Pulse Rate: 104 (05/26 1602)  Labs:  Recent Labs  08/07/13 1600  HGB 15.3  HCT 48.2  PLT 410*  CREATININE 0.82  CKTOTAL 63  TROPONINI 1.90*    The CrCl is unknown because both a height and weight (above a minimum accepted value) are required for this calculation.   Medical History: Past Medical History  Diagnosis Date  . Chronic low back pain   . 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 Chronic neck pain   . OSA (obstructive sleep apnea)     CPAP @ bedtime  . Allergy   . Unspecified essential hypertension     Resistant, 2D Echo - EF-55-60  . Polycythemia   . CAD (coronary artery disease)     Vessel type unspecified  . PVD (peripheral vascular disease)   . DDD (degenerative disc disease), cervical     Medications:  See med rec  Assessment: Patient is a 63 y.o M presented to the ED with right leg ulcers and discoloration of right foot.  To start heparin for right foot ischemia.  He was also found to have elevated troponin.  Cardiology suspects patient had a STEMI at home on Friday-- no plan for cardiac cath.  Goal of Therapy:  Heparin level 0.3-0.7 units/ml Monitor platelets by anticoagulation protocol: Yes   Plan:  1) heparin 4000 units Iv x1 bolus, then drip at 1200 units/hr 2) check 6 hour heparin level  Olean Sangster P Maxximus Gotay 08/07/2013,8:16 PM

## 2013-08-07 NOTE — ED Notes (Addendum)
Pt sts on Friday he started vomiting some. Pt has neuropathy on right foot, pt has 2 ulcers on right lower leg. Pt was on blood thinners but taking off to help the ulcers heal faster. Then put on a new medicine on Friday and that is when he started having GI problems of n/v/d. Last vomited this morning, hasn't taken that medicine this morning. Right foot has discoloration to toes and foot, purple color to these locations. Pt denies CP. Nad, skin warm and dry, resp e/u.

## 2013-08-07 NOTE — Consult Note (Signed)
[facility] [place] HISTORY AND PHYSICAL    Reason for consult: right leg ulcers and discoloration right foot Requesting: [facility] [place] History of Present Illness:  Patient is a 63 y.o. year old male who presents for evaluation of leg ulcers and right foot ischemia.  Pt has several month history of ulcer right leg.  He was followed at the wound center at [facility] and then switched to the [place] wound center several months ago.  None of those records available for review.  Per patient no prior lower extremity arterial or venous testing.  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  Since then he has been wheelchair bound.  He has a history of polycythemia and is currently in the sub acute phase of MI according to Cardiology notes.  He denies pain in the right foot at rest but states it does hurt if he walks on it.  The wounds on his leg are not painful. The toes became dusky 4-5 days ago. He denies history of atrial fibrillation.  He does occasionally smoke cigars.  No prior leg procedures.  Other medical problems include chronic back pain with prior back operations, diabetes, sleep apnea, hypertension and coronary artery disease.  Past Medical History  Diagnosis Date  . Chronic low back pain   . 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 Chronic neck pain   . OSA (obstructive sleep apnea)     CPAP @ bedtime  . Allergy   . Unspecified essential hypertension     Resistant, 2D Echo - [id number]  . Polycythemia   . CAD (coronary artery disease)     Vessel type unspecified  . PVD (peripheral vascular disease)   . DDD (degenerative disc disease), cervical     Past Surgical History  Procedure Laterality Date  . Lower extrmity doppler      [place] 2008; no evidence for PAD  . US echocardiography  11/2008    Normal valves, mild LAE  . Left knee replacement  10/2006  .  Cervical spine surgery  02/2008  . Joint replacement    . Spine surgery      Social History History  Substance Use Topics  . Smoking status: Former Smoker -- 0 years    Types: Cigarettes, Pipe    Quit date: 03/15/1968  . Smokeless tobacco: Former Systems developer     Comment: Pt quit Cigarettes 1970's- and cigars & chew 1990  . Alcohol Use: 3.6 oz/week    6 Cans of beer per week    Family History Family History  Problem Relation Age of Onset  . Heart attack Father     Allergies  No Known Allergies   Current Facility-Administered Medications  Medication Dose Route Frequency Provider Last Rate Last Dose  . furosemide (LASIX) injection 40 mg  40 mg Intravenous Once Orpah Greek, MD       Current Outpatient Prescriptions  Medication Sig Dispense Refill  . gabapentin (NEURONTIN) 300 MG capsule Take 1 capsule (300 mg total) by mouth 3 (three) times daily.  90 capsule  3    ROS:   General:  No weight loss, Fever, chills  HEENT: No recent headaches, no nasal bleeding, no visual changes, no sore throat  Neurologic: No dizziness, blackouts, seizures. No recent symptoms of stroke or mini- stroke. No recent episodes of slurred speech, or temporary blindness.  Cardiac: No  recent episodes of chest pain/pressure, no shortness of breath at rest.  + shortness of breath with exertion.  Denies history of atrial fibrillation or irregular heartbeat  Vascular: No history of rest pain in feet.  No history of claudication.  + history of non-healing ulcer, No history of DVT   Pulmonary: No home oxygen, no productive cough, no hemoptysis,  No asthma or wheezing  Musculoskeletal:  [x ] Arthritis, [x ] Low back pain,  [x ] Joint pain  Hematologic:No history of hypercoagulable state.  No history of easy bleeding.  No history of anemia  Gastrointestinal: No hematochezia or melena,  No gastroesophageal reflux, no trouble swallowing  Urinary: [ ]  chronic Kidney disease, [ ]  on HD - [ ]  MWF or  [ ]  TTHS, [ ]  Burning with urination, [ ]  Frequent urination, [ ]  Difficulty urinating;   Skin: No rashes  Psychological: No history of anxiety,  No history of depression   Physical Examination  Filed Vitals:   08/07/13 1602 08/07/13 1726  BP: 130/90 135/93  Pulse: 104   Temp: 98.9 F (37.2 C)   TempSrc: Oral   Resp: 21 30  SpO2: 96% 96%    There is no weight on file to calculate BMI.  General:  Alert and oriented, no acute distress HEENT: Normal Neck: No JVD Pulmonary: Clear to auscultation bilaterally Cardiac: Regular Rate and Rhythm Abdomen: Soft, non-tender, non-distended, no mass, obese Skin: No rash, livedo dusky pattern plantar aspect of toes/foot right leg, 7 cm ulcer right lateral calf 1 cm depth with healthy appearing granulation tissue Extremity Pulses:  2+ femoral right, 1+ femoral left, 1+ dorsalis pedis left, absent DP right and posterior tibial pulses bilaterally, feet symmetrically warm Musculoskeletal: No deformity or edema, prefers to keep right knee in a flexed position  Neurologic: Upper and lower extremity motor 5/5 and symmetric  DATA:  CBC    Component Value Date/Time   WBC 16.2* 08/07/2013 1600   WBC 12.9* 04/04/2013 1125   RBC 5.77 08/07/2013 1600   RBC 5.23 04/04/2013 1125   HGB 15.3 08/07/2013 1600   HGB 15.1 04/04/2013 1125   HCT 48.2 08/07/2013 1600   HCT 46.9 04/04/2013 1125   PLT 410* 08/07/2013 1600   PLT 459* 04/04/2013 1125   MCV 83.5 08/07/2013 1600   MCV 89.7 04/04/2013 1125   MCH 26.5 08/07/2013 1600   MCH 28.9 04/04/2013 1125   MCHC 31.7 08/07/2013 1600   MCHC 32.2 04/04/2013 1125   RDW 18.8* 08/07/2013 1600   RDW 16.1* 04/04/2013 1125   LYMPHSABS 0.6* 08/07/2013 1600   LYMPHSABS 1.5 04/04/2013 1125   MONOABS 0.6 08/07/2013 1600   MONOABS 0.2 04/04/2013 1125   EOSABS 0.3 08/07/2013 1600   EOSABS 0.8* 04/04/2013 1125   BASOSABS 0.0 08/07/2013 1600   BASOSABS 0.0 04/04/2013 1125    BMET    Component Value Date/Time   NA 140 08/07/2013 1600    NA 136 03/02/2013 1055   K 4.4 08/07/2013 1600   K 4.2 03/02/2013 1055   CL 105 08/07/2013 1600   CL 108* 08/04/2012 1302   CO2 22 08/07/2013 1600   CO2 22 03/02/2013 1055   GLUCOSE 111* 08/07/2013 1600   GLUCOSE 111 03/02/2013 1055   GLUCOSE 164* 08/04/2012 1302   BUN 18 08/07/2013 1600   BUN 13.8 03/02/2013 1055   CREATININE 0.82 08/07/2013 1600   CREATININE 0.7 03/02/2013 1055   CALCIUM 9.1 08/07/2013 1600   CALCIUM 9.7 03/02/2013 1055  GFRNONAA >90 08/07/2013 1600   GFRAA >90 08/07/2013 1600    ASSESSMENT:  Combined arterial venous pathology right leg most likely subacute worsening of chronic problem.  This has been going on several days in some aspects and several months in others.  Recent MI limits options for any revascularization procedure if necessary.   PLAN:  1.  Heparin drip for now  2.  Arterial duplex, ABI in a.m.   3. Consult wound care service for leg wounds  4. Will follow up with pt after arterial studies complete.  4.  Would recommend touching base with his hematologist to restart any meds that he is supposed to be taking for his polycythemia  5.  Aspirin daily for now  Ruta Hinds, MD [facility] [place] Office: [phone] Pager: [phone]

## 2013-08-08 ENCOUNTER — Encounter (HOSPITAL_COMMUNITY)
Admission: EM | Disposition: A | Discharge status: Skilled Nursing Facility | Payer: Self-pay | Source: Home / Self Care | Attending: Internal Medicine

## 2013-08-08 DIAGNOSIS — I2510 Atherosclerotic heart disease of native coronary artery without angina pectoris: Secondary | ICD-10-CM

## 2013-08-08 DIAGNOSIS — L97909 Non-pressure chronic ulcer of unspecified part of unspecified lower leg with unspecified severity: Secondary | ICD-10-CM

## 2013-08-08 HISTORY — PX: LEFT HEART CATHETERIZATION WITH CORONARY ANGIOGRAM: SHX5451

## 2013-08-08 LAB — GLUCOSE, CAPILLARY: GLUCOSE-CAPILLARY: 152 mg/dL — AB (ref 70–99)

## 2013-08-08 LAB — TROPONIN I
TROPONIN I: 2.97 ng/mL — AB (ref <0.30)
TROPONIN I: 2.21 ng/mL — AB (ref <0.30)
Troponin I: 1.93 ng/mL (ref <0.30)

## 2013-08-08 LAB — BASIC METABOLIC PANEL
BUN: 21 mg/dL (ref 6–23)
CHLORIDE: 102 meq/L (ref 96–112)
CO2: 21 mEq/L (ref 19–32)
Calcium: 8.9 mg/dL (ref 8.4–10.5)
Creatinine, Ser: 0.93 mg/dL (ref 0.50–1.35)
GFR calc Af Amer: >90 mL/min (ref >90)
GFR calc non Af Amer: 88 mL/min — ABNORMAL LOW (ref >90)
Glucose, Bld: 101 mg/dL — ABNORMAL HIGH (ref 70–99)
POTASSIUM: 4 meq/L (ref 3.7–5.3)
Sodium: 139 mEq/L (ref 137–147)

## 2013-08-08 LAB — CBC
HCT: 45.1 % (ref 39.0–52.0)
HEMOGLOBIN: 14.6 g/dL (ref 13.0–17.0)
MCH: 27.2 pg (ref 26.0–34.0)
MCHC: 32.4 g/dL (ref 30.0–36.0)
MCV: 84 fL (ref 78.0–100.0)
PLATELETS: 376 10*3/uL (ref 150–400)
RBC: 5.37 MIL/uL (ref 4.22–5.81)
RDW: 18.7 % — ABNORMAL HIGH (ref 11.5–15.5)
WBC: 18 10*3/uL — ABNORMAL HIGH (ref 4.0–10.5)

## 2013-08-08 LAB — URINE CULTURE: Colony Count: 3000

## 2013-08-08 LAB — HEPARIN LEVEL (UNFRACTIONATED): Heparin Unfractionated: <0.1 IU/mL — ABNORMAL LOW (ref 0.30–0.70)

## 2013-08-08 LAB — PROTIME-INR
INR: 1.55 — ABNORMAL HIGH (ref 0.00–1.49)
Prothrombin Time: 18.2 seconds — ABNORMAL HIGH (ref 11.6–15.2)

## 2013-08-08 LAB — POCT ACTIVATED CLOTTING TIME: ACTIVATED CLOTTING TIME: 133 s

## 2013-08-08 LAB — MRSA PCR SCREENING: MRSA by PCR: POSITIVE — AB

## 2013-08-08 SURGERY — LEFT HEART CATHETERIZATION WITH CORONARY ANGIOGRAM
Anesthesia: LOCAL

## 2013-08-08 MED ORDER — HEPARIN BOLUS VIA INFUSION
2500.0000 [IU] | Freq: Once | INTRAVENOUS | Status: AC
  Administered 2013-08-08: 2500 [IU] via INTRAVENOUS
  Filled 2013-08-08: qty 2500

## 2013-08-08 MED ORDER — PNEUMOCOCCAL VAC POLYVALENT 25 MCG/0.5ML IJ INJ: 0.5000 mL | INJECTION | INTRAMUSCULAR | Status: DC | PRN

## 2013-08-08 MED ORDER — VERAPAMIL HCL 2.5 MG/ML IV SOLN
INTRAVENOUS | Status: AC
  Filled 2013-08-08: qty 2

## 2013-08-08 MED ORDER — FUROSEMIDE 10 MG/ML IJ SOLN
40.0000 mg | Freq: Two times a day (BID) | INTRAMUSCULAR | Status: DC
  Administered 2013-08-08 (×2): 40 mg via INTRAVENOUS
  Filled 2013-08-08 (×5): qty 4

## 2013-08-08 MED ORDER — SODIUM CHLORIDE 0.9 % IV SOLN: 250.0000 mL | INTRAVENOUS | Status: DC | PRN

## 2013-08-08 MED ORDER — HEPARIN BOLUS VIA INFUSION
3000.0000 [IU] | Freq: Once | INTRAVENOUS | Status: AC
  Administered 2013-08-08: 3000 [IU] via INTRAVENOUS
  Filled 2013-08-08: qty 3000

## 2013-08-08 MED ORDER — HEPARIN (PORCINE) IN NACL 2-0.9 UNIT/ML-% IJ SOLN
INTRAMUSCULAR | Status: AC
  Filled 2013-08-08: qty 1500

## 2013-08-08 MED ORDER — LISINOPRIL 5 MG PO TABS
5.0000 mg | ORAL_TABLET | Freq: Every day | ORAL | Status: DC
  Administered 2013-08-08 – 2013-08-17 (×10): 5 mg via ORAL
  Filled 2013-08-08 (×11): qty 1

## 2013-08-08 MED ORDER — ASPIRIN 81 MG PO CHEW: 81.0000 mg | CHEWABLE_TABLET | ORAL | Status: DC

## 2013-08-08 MED ORDER — HYDROCOD POLST-CHLORPHEN POLST 10-8 MG/5ML PO LQCR
5.0000 mL | Freq: Two times a day (BID) | ORAL | Status: DC | PRN
  Administered 2013-08-09 – 2013-08-16 (×4): 5 mL via ORAL
  Filled 2013-08-08 (×4): qty 5

## 2013-08-08 MED ORDER — ATORVASTATIN CALCIUM 80 MG PO TABS
80.0000 mg | ORAL_TABLET | Freq: Every day | ORAL | Status: DC
  Administered 2013-08-09 – 2013-08-16 (×8): 80 mg via ORAL
  Filled 2013-08-08 (×10): qty 1

## 2013-08-08 MED ORDER — LIDOCAINE HCL (PF) 1 % IJ SOLN
INTRAMUSCULAR | Status: AC
  Filled 2013-08-08: qty 30

## 2013-08-08 MED ORDER — SODIUM CHLORIDE 0.9 % IJ SOLN: 3.0000 mL | INTRAMUSCULAR | Status: DC | PRN

## 2013-08-08 MED ORDER — SODIUM CHLORIDE 0.9 % IJ SOLN
3.0000 mL | Freq: Two times a day (BID) | INTRAMUSCULAR | Status: DC
  Administered 2013-08-08: 3 mL via INTRAVENOUS

## 2013-08-08 MED ORDER — FUROSEMIDE 10 MG/ML IJ SOLN
INTRAMUSCULAR | Status: AC
  Filled 2013-08-08: qty 4

## 2013-08-08 MED ORDER — MORPHINE SULFATE 2 MG/ML IJ SOLN
2.0000 mg | INTRAMUSCULAR | Status: DC | PRN
  Administered 2013-08-08 – 2013-08-15 (×5): 2 mg via INTRAVENOUS
  Filled 2013-08-08 (×5): qty 1

## 2013-08-08 MED ORDER — NITROGLYCERIN 0.2 MG/ML ON CALL CATH LAB
INTRAVENOUS | Status: AC
  Filled 2013-08-08: qty 1

## 2013-08-08 MED ORDER — MUPIROCIN 2 % EX OINT
1.0000 "application " | TOPICAL_OINTMENT | Freq: Two times a day (BID) | CUTANEOUS | Status: AC
  Administered 2013-08-08 – 2013-08-12 (×9): 1 via NASAL
  Filled 2013-08-08 (×2): qty 22

## 2013-08-08 MED ORDER — GABAPENTIN 300 MG PO CAPS
300.0000 mg | ORAL_CAPSULE | Freq: Three times a day (TID) | ORAL | Status: DC
  Administered 2013-08-09 – 2013-08-17 (×19): 300 mg via ORAL
  Filled 2013-08-08 (×32): qty 1

## 2013-08-08 MED ORDER — HEPARIN (PORCINE) IN NACL 2-0.9 UNIT/ML-% IJ SOLN
INTRAMUSCULAR | Status: AC
  Filled 2013-08-08: qty 1000

## 2013-08-08 MED ORDER — ONDANSETRON HCL 4 MG/2ML IJ SOLN: 4.0000 mg | Freq: Four times a day (QID) | INTRAMUSCULAR | Status: DC | PRN

## 2013-08-08 MED ORDER — HEPARIN SODIUM (PORCINE) 1000 UNIT/ML IJ SOLN
INTRAMUSCULAR | Status: AC
  Filled 2013-08-08: qty 1

## 2013-08-08 MED ORDER — SODIUM CHLORIDE 0.9 % IJ SOLN
3.0000 mL | Freq: Two times a day (BID) | INTRAMUSCULAR | Status: DC
  Administered 2013-08-09 – 2013-08-17 (×15): 3 mL via INTRAVENOUS

## 2013-08-08 MED ORDER — HEPARIN (PORCINE) IN NACL 100-0.45 UNIT/ML-% IJ SOLN
2050.0000 [IU]/h | INTRAMUSCULAR | Status: DC
  Administered 2013-08-08: 1750 [IU]/h via INTRAVENOUS
  Administered 2013-08-09: 2050 [IU]/h via INTRAVENOUS
  Filled 2013-08-08 (×3): qty 250

## 2013-08-08 MED ORDER — ONDANSETRON HCL 4 MG PO TABS: 4.0000 mg | ORAL_TABLET | Freq: Four times a day (QID) | ORAL | Status: DC | PRN

## 2013-08-08 MED ORDER — HEPARIN (PORCINE) IN NACL 100-0.45 UNIT/ML-% IJ SOLN
1750.0000 [IU]/h | INTRAMUSCULAR | Status: DC
Start: 2013-08-08 — End: 2013-08-08
  Administered 2013-08-08: 1750 [IU]/h via INTRAVENOUS

## 2013-08-08 MED ORDER — CHLORHEXIDINE GLUCONATE CLOTH 2 % EX PADS
6.0000 | MEDICATED_PAD | Freq: Every day | CUTANEOUS | Status: AC
  Administered 2013-08-08 – 2013-08-12 (×5): 6 via TOPICAL

## 2013-08-08 NOTE — Progress Notes (Signed)
PT Cancellation Note  Patient Details Name: Fernando Lane MRN: [id number] DOB: June 22, 1950   Cancelled Treatment:    Reason Eval/Treat Not Completed: Patient not medically ready (strict bedrest orders active)   [name] 08/08/2013, 11:06 AM Alben Deeds, PT DPT  [phone]
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
## 2013-08-08 NOTE — Consult Note (Signed)
Foot still hurts  Arterial studies pending this am Right foot warm with brisk doppler DP/PT but appearance of atheroembolic even Left foot cool but brisk doppler signals  Will await non invasive studies. May need further work up with CTA abdomen pelvis and runoff Very high risk for limb loss Continue heparin  Ruta Hinds, MD [facility] [place] Office: [phone] Pager: [phone]

## 2013-08-08 NOTE — Interval H&P Note (Signed)
History and Physical Interval Note:  08/08/2013 2:43 PM  Fernando Lane  has presented today for surgery, with the diagnosis of cp  The various methods of treatment have been discussed with the patient and family. After consideration of risks, benefits and other options for treatment, the patient has consented to  Procedure(s): LEFT HEART CATHETERIZATION WITH CORONARY ANGIOGRAM (N/A) as a surgical intervention .  The patient's history has been reviewed, patient examined, no change in status, stable for surgery.  I have reviewed the patient's chart and labs.  Questions were answered to the patient's satisfaction.    Cath Lab Visit (complete for each Cath Lab visit)  Clinical Evaluation Leading to the Procedure:   ACS: yes  Non-ACS:    Anginal Classification: CCS IV  Anti-ischemic medical therapy: No Therapy  Non-Invasive Test Results: No non-invasive testing performed  Prior CABG: No previous CABG       Ander Slade [facility] 08/08/2013 2:43 PM

## 2013-08-08 NOTE — Progress Notes (Signed)
ANTICOAGULATION CONSULT NOTE - Follow Up Consult  Pharmacy Consult:  Heparin Indication:  PAD  No Known Allergies  Patient Measurements: Height: 5\' 5"  (165.1 cm) Weight: 226 lb 13.7 oz (102.9 kg) IBW/kg (Calculated) : 61.5 Heparin Dosing Weight: 87 kg  Vital Signs: Temp: 98.6 F (37 C) (05/27 0800) Temp src: Oral (05/27 0800) BP: 136/87 mmHg (05/27 0445) Pulse Rate: 107 (05/27 0445)  Labs:  Recent Labs  08/07/13 1600 08/08/13 0255 08/08/13 0258 08/08/13 0500  HGB 15.3  --   --  14.6  HCT 48.2  --   --  45.1  PLT 410*  --   --  376  HEPARINUNFRC  --  <0.10*  --   --   CREATININE 0.82  --  0.93  --   CKTOTAL 63  --   --   --   TROPONINI 1.90*  --  2.97*  --     Estimated Creatinine Clearance: 89.8 ml/min (by C-G formula based on Cr of 0.93).    Assessment: 77 YOM presented to the ED with right leg ulcers and discoloration of right foot.  Pharmacy managing IV heparin for right foot ischemia.  He was also found to have elevated troponin, thought from recent STEMI.  Heparin level is undetectable despite rate increase.  No complication with infusion per RN.  No bleeding reported either.   Goal of Therapy:  Heparin level 0.3-0.7 units/ml Monitor platelets by anticoagulation protocol: Yes    Plan:  - Rebolus with 2500 units, then increase heparin gtt to 1750 units/hr - Check 6 hr HL - Daily HL / CBC - F/U long-term AC when able    Hayven Fatima D. Mina Marble, PharmD, BCPS Pager:  [phone] 08/08/2013, 11:59 AM

## 2013-08-08 NOTE — Progress Notes (Signed)
ANTICOAGULATION CONSULT NOTE - Initial Consult  Pharmacy Consult for heparin Indication: PAD  No Known Allergies  Patient Measurements: weight 110 kg, height 65 inches (per patient) Height: 5\' 5"  (165.1 cm) Weight: 226 lb 13.7 oz (102.9 kg) IBW/kg (Calculated) : 61.5 Heparin Dosing Weight: 87kg  Vital Signs: Temp: 97.5 F (36.4 C) (05/27 0153) Temp src: Oral (05/27 0153) BP: 130/88 mmHg (05/27 0153) Pulse Rate: 108 (05/27 0326)  Labs:  Recent Labs  08/07/13 1600 08/08/13 0255 08/08/13 0258 08/08/13 0500  HGB 15.3  --   --  14.6  HCT 48.2  --   --  45.1  PLT 410*  --   --  376  HEPARINUNFRC  --  <0.10*  --   --   CREATININE 0.82  --   --   --   CKTOTAL 63  --   --   --   TROPONINI 1.90*  --  2.97*  --    Estimated Creatinine Clearance: 101.9 ml/min (by C-G formula based on Cr of 0.82).  Medical History: Past Medical History  Diagnosis Date  . Chronic low back pain   . 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 Chronic neck pain   . OSA (obstructive sleep apnea)     CPAP @ bedtime  . Allergy   . Unspecified essential hypertension     Resistant, 2D Echo - EF-55-60  . Polycythemia   . CAD (coronary artery disease)     Vessel type unspecified  . PVD (peripheral vascular disease)   . DDD (degenerative disc disease), cervical    Medications:  See med rec  Assessment: Patient is a 63 y.o M presented to the ED with right leg ulcers and discoloration of right foot.  To start heparin for right foot ischemia.  He was also found to have elevated troponin.  Cardiology suspects patient had a STEMI at home on Friday-- no plan for cardiac cath.  5/27 - Spoke with patients nurse, IV infusing appropriately and no line issues.  He is without noted bleeding.  His initial level is low < 0.1 on 1200 units/hr after bolus of 4000 units.  Goal of Therapy:  Heparin level 0.3-0.7 units/ml Monitor platelets by anticoagulation protocol:  Yes   Plan:  1) Will give heparin 3000 units Iv x1 bolus, then drip at 1450 units/hr 2) check 6 hour heparin level  Rober Minion, PharmD., MS Clinical Pharmacist Pager:  [phone] Thank you for allowing pharmacy to be part of this patients care team. 08/08/2013,4:16 AM
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
## 2013-08-08 NOTE — Progress Notes (Addendum)
VASCULAR LAB PRELIMINARY  ARTERIAL  ABI completed:      RIGHT    LEFT    PRESSURE WAVEFORM  PRESSURE WAVEFORM  BRACHIAL 120 Triphasic  BRACHIAL 121  Triphasic   DP 124 Triphasic  DP  absent  AT   AT    PT 98 Monophasic PT  absent  PER   At great toe 301 Monophasic   GREAT TOE  NA GREAT TOE  NA    RIGHT LEFT  ABI 1.03 absent   Duplex imaging:  No evidence of stenosis or occlusion in the CFA, FA, Pop a bilaterally.  Waveforms triphasic throughout bilaterally.    Nani Ravens, RVT 08/08/2013, 9:24 AM

## 2013-08-08 NOTE — Op Note (Signed)
    OPERATIVE REPORT  DATE OF SURGERY: 08/08/2013  PATIENT: Fernando Lane, 63 y.o. male MRN: [id number]  DOB: [birth year]  PRE-OPERATIVE DIAGNOSIS: Bilateral lower extremity ischemia  POST-OPERATIVE DIAGNOSIS:  Same  PROCEDURE: Aortogram with bilateral extremity runoff  SURGEON:  Curt Jews, M.D.  PHYSICIAN ASSISTANT: Nurse  ANESTHESIA:  Lidocaine local  EBL: Minimal ml  Total I/O In: 183.7 [I.V.:183.7] Out: 175 [Urine:175]  BLOOD ADMINISTERED: None  DRAINS: None  SPECIMEN: None  COUNTS CORRECT:  YES  PLAN OF CARE: Holding area   PATIENT DISPOSITION:  PACU - hemodynamically stable  PROCEDURE DETAILS: Patient is a 63 year old gentleman presented to the emergency department last evening with bilateral lower extremity ischemic changes. He also had unstable cardiac situation. He has undergone cardiac catheterization today by Dr. Carlis Stable. We discussed proceeding with lower extremity arteriogram in the same setting for evaluation of his distal flow.  After completion of the cardiac catheterization and we dictated as separate note a pigtail catheter position the level of the renal arteries and the aorta pelvic injection was undertaken. This did not show the renal arteries the catheter was just below showing visualization of the left renal artery only. The aortoiliac segments were widely patent. Next the pigtail catheter was exchanged for a crossover catheter and using the crossover catheter and a angled Glidewire the wire was positioned down to the level of the femoral artery. The crossover catheter was removed and an endhole catheters position the left external iliac artery via right femoral approach. The runoff on the right revealed a very flow very slow flow. The left leg runoff revealed wide patency of the external iliac common proximal SFA and profundus femoris artery. There was moderate narrowing at the adductor canal. The patient had a total knee replacement. There was  extremely slow flow through all tibial vessels. There was some irregularity but all vessels were patent as far as could be seen. Posterior tibial was the dominant vessel and did have a subtotal occlusion at the level of the ankle. Next the endhole catheter was removed from the iliac artery and right leg runoff was obtained through the right femoral sheath. This revealed this revealed wide patency of common proximal superficial femoral and deep femoral artery. There was irregularity at the adductor canal. The runoff was via the anterior tibial artery which had a short segment occlusion at the ankle and and good flow into the foot. There was reconstitution of the posterior tibial by collaterals at the ankle. The she had no immediate complications. The sheath was pulled after ACT was normalized   Curt Jews, M.D. 08/08/2013 3:44 PM
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
## 2013-08-08 NOTE — Progress Notes (Signed)
ANTICOAGULATION CONSULT NOTE - Follow Up Consult  Pharmacy Consult:  Heparin Indication:  PAD/ACS/STEMI  No Known Allergies  Patient Measurements: Height: 5\' 5"  (165.1 cm) Weight: 226 lb 13.7 oz (102.9 kg) IBW/kg (Calculated) : 61.5 Heparin Dosing Weight: 87 kg  Vital Signs: Temp: 98.3 F (36.8 C) (05/27 1255) Temp src: Oral (05/27 1255) BP: 101/81 mmHg (05/27 1845) Pulse Rate: 104 (05/27 1456)  Labs:  Recent Labs  08/07/13 1600 08/08/13 0255 08/08/13 0258 08/08/13 0500 08/08/13 0914 08/08/13 1055 08/08/13 1140  HGB 15.3  --   --  14.6  --   --   --   HCT 48.2  --   --  45.1  --   --   --   PLT 410*  --   --  376  --   --   --   LABPROT  --   --   --   --   --   --  18.2*  INR  --   --   --   --   --   --  1.55*  HEPARINUNFRC  --  <0.10*  --   --   --  <0.10*  --   CREATININE 0.82  --  0.93  --   --   --   --   CKTOTAL 63  --   --   --   --   --   --   TROPONINI 1.90*  --  2.97*  --  2.21*  --   --     Estimated Creatinine Clearance: 89.8 ml/min (by C-G formula based on Cr of 0.93).    Assessment: 35 YOM presented to the ED with right leg ulcers and discoloration of right foot.  Pharmacy managing IV heparin for right foot ischemia.  He was also found to have elevated troponin, thought from recent STEMI.  AM heparin level was undetectable despite rate increase.  Pharmacy has been consulted to resume heparin 8 hrs after sheath removal for ACS/STEMI.  He is s/p cardiac cath followed by peripheral angiogram.  Cath findings: severe 2VCAD of mid to distal LAD and mid RCA, severe LV dysfunction.  Sheath removal at 6948 with R groin hemostasis obtained at 1608.   Goal of Therapy:  Heparin level 0.3-0.7 units/ml Monitor platelets by anticoagulation protocol: Yes    Plan:  - Resume heparin drip at 1750 units/hr 8 hrs post sheath removal at midnight - Check 6 hr HL at 0600 am - Daily HL / CBC - F/U long-term AC when able Eudelia Bunch, Pharm.D. [phone] 08/08/2013  7:12 PM

## 2013-08-08 NOTE — CV Procedure (Signed)
   Cardiac Catheterization Procedure Note  Name: Fernando Lane MRN: [id number] DOB: Jul 13, 1950  Procedure: Left Heart Cath, Selective Coronary Angiography, LV angiography  Indication: 63 yo WM with new onset CHF and NSTEMI. Also has ischemic right foot.   Procedural details: The right groin was prepped, draped, and anesthetized with 1% lidocaine. Using modified Seldinger technique, a 5 French sheath was introduced into the right femoral artery. Standard Judkins catheters were used for coronary angiography and left ventriculography. Catheter exchanges were performed over a guidewire. There were no immediate procedural complications. Dr. Donnetta Hutching to perform peripheral angiogram following this procedure.  55 cc of Visapaque was used.   Procedural Findings: Hemodynamics:  AO 114/78 mean 93 mm Hg LV 117/29 mm Hg   Coronary angiography: Coronary dominance: right  Left mainstem: Calcified without significant disease.  Left anterior descending (LAD): Calcified. The LAD is occluded 100% in the mid to distal vessel. No significant collateral flow.  Left circumflex (LCx): Diffuse mild nonobstructive disease less than 30%.  Right coronary artery (RCA): 100% occlusion in the mid vessel. No significant collateral flow.  Left ventriculography: Left ventricular systolic function is severely abnormal, LVEF is estimated at 20%. There is severe global hypokinesis and extensive apical dyskinesis. There is no significant mitral regurgitation. The apex does not fill well with contrast and LV thrombus cannot be excluded.   Final Conclusions:   1. Severe 2 vessel occlusive CAD involving the mid to distal LAD and mid RCA.  2. Severe LV dysfunction.   Recommendations: Medical management of CAD. Not a candidate for revascularization. Recommend Echo with definity contrast to evaluate possibility of LV thrombus.   Ander Slade [facility] 08/08/2013, 3:12 PM

## 2013-08-09 ENCOUNTER — Telehealth: Payer: Self-pay | Admitting: Internal Medicine

## 2013-08-09 DIAGNOSIS — IMO0002 Reserved for concepts with insufficient information to code with codable children: Secondary | ICD-10-CM

## 2013-08-09 DIAGNOSIS — I319 Disease of pericardium, unspecified: Secondary | ICD-10-CM

## 2013-08-09 LAB — LIPID PANEL
CHOLESTEROL: 104 mg/dL (ref 0–200)
HDL: 23 mg/dL — AB (ref >39)
LDL Cholesterol: 59 mg/dL (ref 0–99)
Total CHOL/HDL Ratio: 4.5 RATIO
Triglycerides: 108 mg/dL (ref <150)
VLDL: 22 mg/dL (ref 0–40)

## 2013-08-09 LAB — HEPARIN LEVEL (UNFRACTIONATED)

## 2013-08-09 LAB — BASIC METABOLIC PANEL
BUN: 23 mg/dL (ref 6–23)
CALCIUM: 8.8 mg/dL (ref 8.4–10.5)
CHLORIDE: 99 meq/L (ref 96–112)
CO2: 28 meq/L (ref 19–32)
Creatinine, Ser: 1.07 mg/dL (ref 0.50–1.35)
GFR calc Af Amer: 83 mL/min — ABNORMAL LOW (ref >90)
GFR calc non Af Amer: 72 mL/min — ABNORMAL LOW (ref >90)
Glucose, Bld: 96 mg/dL (ref 70–99)
POTASSIUM: 4 meq/L (ref 3.7–5.3)
SODIUM: 138 meq/L (ref 137–147)

## 2013-08-09 LAB — CBC
HCT: 45.5 % (ref 39.0–52.0)
HEMOGLOBIN: 14.5 g/dL (ref 13.0–17.0)
MCH: 27.1 pg (ref 26.0–34.0)
MCHC: 31.9 g/dL (ref 30.0–36.0)
MCV: 84.9 fL (ref 78.0–100.0)
PLATELETS: 499 10*3/uL — AB (ref 150–400)
RBC: 5.36 MIL/uL (ref 4.22–5.81)
RDW: 18.8 % — ABNORMAL HIGH (ref 11.5–15.5)
WBC: 17.1 10*3/uL — AB (ref 4.0–10.5)

## 2013-08-09 MED ORDER — PERFLUTREN LIPID MICROSPHERE
1.0000 mL | INTRAVENOUS | Status: AC | PRN
  Filled 2013-08-09: qty 10

## 2013-08-09 MED ORDER — LORAZEPAM 1 MG PO TABS: 1.0000 mg | ORAL_TABLET | Freq: Four times a day (QID) | ORAL | Status: AC | PRN

## 2013-08-09 MED ORDER — FOLIC ACID 1 MG PO TABS
1.0000 mg | ORAL_TABLET | Freq: Every day | ORAL | Status: DC
  Administered 2013-08-09 – 2013-08-17 (×9): 1 mg via ORAL
  Filled 2013-08-09 (×9): qty 1

## 2013-08-09 MED ORDER — FUROSEMIDE 10 MG/ML IJ SOLN
INTRAMUSCULAR | Status: AC
  Filled 2013-08-09: qty 8

## 2013-08-09 MED ORDER — SPIRONOLACTONE 12.5 MG HALF TABLET
12.5000 mg | ORAL_TABLET | Freq: Every day | ORAL | Status: DC
  Administered 2013-08-09 – 2013-08-17 (×9): 12.5 mg via ORAL
  Filled 2013-08-09 (×9): qty 1

## 2013-08-09 MED ORDER — LORAZEPAM 2 MG/ML IJ SOLN: 1.0000 mg | Freq: Four times a day (QID) | INTRAMUSCULAR | Status: AC | PRN

## 2013-08-09 MED ORDER — FUROSEMIDE 10 MG/ML IJ SOLN
60.0000 mg | Freq: Three times a day (TID) | INTRAMUSCULAR | Status: DC
  Administered 2013-08-09 – 2013-08-10 (×3): 60 mg via INTRAVENOUS
  Filled 2013-08-09 (×3): qty 6

## 2013-08-09 MED ORDER — VITAMIN B-1 100 MG PO TABS
100.0000 mg | ORAL_TABLET | Freq: Every day | ORAL | Status: DC
  Administered 2013-08-09 – 2013-08-17 (×9): 100 mg via ORAL
  Filled 2013-08-09 (×9): qty 1

## 2013-08-09 MED ORDER — PERFLUTREN LIPID MICROSPHERE
INTRAVENOUS | Status: AC
  Administered 2013-08-09: 2 mL
  Filled 2013-08-09: qty 10

## 2013-08-09 MED ORDER — ADULT MULTIVITAMIN W/MINERALS CH
1.0000 | ORAL_TABLET | Freq: Every day | ORAL | Status: DC
Start: 2013-08-09 — End: 2013-08-17
  Administered 2013-08-09 – 2013-08-17 (×9): 1 via ORAL
  Filled 2013-08-09 (×9): qty 1

## 2013-08-09 MED ORDER — THIAMINE HCL 100 MG/ML IJ SOLN
100.0000 mg | Freq: Every day | INTRAMUSCULAR | Status: DC
  Filled 2013-08-09 (×5): qty 1

## 2013-08-09 MED ORDER — COLLAGENASE 250 UNIT/GM EX OINT
TOPICAL_OINTMENT | Freq: Every day | CUTANEOUS | Status: DC
  Administered 2013-08-09 – 2013-08-17 (×9): via TOPICAL
  Filled 2013-08-09: qty 30

## 2013-08-09 MED ORDER — LORAZEPAM 1 MG PO TABS: 0.0000 mg | ORAL_TABLET | Freq: Four times a day (QID) | ORAL | Status: AC

## 2013-08-09 MED ORDER — ENSURE COMPLETE PO LIQD
237.0000 mL | Freq: Two times a day (BID) | ORAL | Status: DC
  Administered 2013-08-09 – 2013-08-17 (×12): 237 mL via ORAL

## 2013-08-09 MED ORDER — LORAZEPAM 1 MG PO TABS: 0.0000 mg | ORAL_TABLET | Freq: Two times a day (BID) | ORAL | Status: AC

## 2013-08-09 NOTE — Progress Notes (Signed)
Rehab Admissions Coordinator Note:  Patient was screened by Cleatrice Burke for appropriateness for an Inpatient Acute Rehab Consult per PT recommendation. At this time, we are recommending [facility]. [employer] medicare will not approve an inpt rehab admission for this diagnosis.  Audelia Acton [facility] 08/09/2013, 2:57 PM  I can be reached at [phone].

## 2013-08-09 NOTE — Care Management Note (Addendum)
    Page 1 of 1   08/14/2013     4:22:48 PM CARE MANAGEMENT NOTE 08/14/2013  Patient:  Fernando Lane, Fernando Lane   Account Number:  [id number]  Date Initiated:  08/09/2013  Documentation initiated by:  Elissa Hefty  Subjective/Objective Assessment:   adm w mi, leg ischemia     Action/Plan:   lives w friend, pcp valerie leschber   Anticipated DC Date:  08/15/2013   Anticipated DC Plan:  [place]  In-house referral  Clinical Social Worker      DC Planning Services  CM consult      Choice offered to / List presented to:             Status of service:  In process, will continue to follow Medicare Important Message given?  YES (If response is "NO", the following Medicare IM given date fields will be blank) Date Medicare IM given:  08/13/2013 Date Additional Medicare IM given:  08/14/2013  Discharge Disposition:  [place]  Per UR Regulation:  Reviewed for med. necessity/level of care/duration of stay  If discussed at [place] of Stay Meetings, dates discussed:   08/14/2013    Comments:  5/29 1022a debbie dowell rn,bsn spoke w pt. he will probably go to [facility] for rehab. pt lives alone in apt and support neighbors but has to be indep at [facility]. we discussed [facility] but will prob do [facility] for extra rehab prior to home.

## 2013-08-09 NOTE — Progress Notes (Signed)
Pt is only able to walk a few steps with PT. Will f/u for education only until pt is walking in hall. Yves Dill CES, ACSM 3:32 PM 08/09/2013

## 2013-08-09 NOTE — Progress Notes (Addendum)
[facility]  Subjective  -" Feels like Im walking on marbles under my left foot."   Objective 114/74 97 98.8 F (37.1 C) (Oral) 23 96%  Intake/Output Summary (Last 24 hours) at 08/09/13 0929 Last data filed at 08/09/13 0600  Gross per 24 hour  Intake 1156.18 ml  Output    500 ml  Net 656.18 ml    Palpable right DP/ Doppler DP left Left foot is cool to touch Ulcer left knee Right foot with erythema around ankle area, no abnormal warmth, foot is well perfused Right groin without hematoma  Assessment/Planning: POD #1 Aortogram with bilateral extremity runoff No complications   Ulyses Amor 08/09/2013 9:29 AM --  Laboratory Lab Results:  Recent Labs  08/08/13 0500 08/09/13 0246  WBC 18.0* 17.1*  HGB 14.6 14.5  HCT 45.1 45.5  PLT 376 499*   BMET  Recent Labs  08/08/13 0258 08/09/13 0246  NA 139 138  K 4.0 4.0  CL 102 99  CO2 21 28  GLUCOSE 101* 96  BUN 21 23  CREATININE 0.93 1.07  CALCIUM 8.9 8.8    COAG Lab Results  Component Value Date   INR 1.55* 08/08/2013   No results found for this basename: PTT      Right foot unchanged. Has in line arterial flow to foot.  No intervention to improve this.  If wounds deteriorate or unrelenting pain only option to consider would be BKA or AKA and he is no where near this currently.  Will sign off.  Call as needed.  He does not need follow up with me unless deteriorating wounds or wants amputation  Ruta Hinds, MD [facility]: [phone] Pager: [phone]
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
## 2013-08-09 NOTE — Progress Notes (Addendum)
ANTICOAGULATION CONSULT NOTE   Pharmacy Consult:  Heparin Indication:  Rule out LV thrombus  No Known Allergies  Patient Measurements: Height: 5\' 5"  (165.1 cm) Weight: 223 lb 12.3 oz (101.5 kg) IBW/kg (Calculated) : 61.5 Heparin Dosing Weight: 87 kg  Vital Signs: Temp: 98.8 F (37.1 C) (05/28 0400) Temp src: Oral (05/28 0400) BP: 114/74 mmHg (05/28 0400) Pulse Rate: 97 (05/28 0800)  Labs:  Recent Labs  08/07/13 1600  08/08/13 0258 08/08/13 0500 08/08/13 0914 08/08/13 1055 08/08/13 1140 08/08/13 2000 08/09/13 0246 08/09/13 0600 08/09/13 0730  HGB 15.3  --   --  14.6  --   --   --   --  14.5  --   --   HCT 48.2  --   --  45.1  --   --   --   --  45.5  --   --   PLT 410*  --   --  376  --   --   --   --  499*  --   --   LABPROT  --   --   --   --   --   --  18.2*  --   --   --   --   INR  --   --   --   --   --   --  1.55*  --   --   --   --   HEPARINUNFRC  --   < >  --   --   --  <0.10*  --   --   --  <0.10* <0.10*  CREATININE 0.82  --  0.93  --   --   --   --   --  1.07  --   --   CKTOTAL 63  --   --   --   --   --   --   --   --   --   --   TROPONINI 1.90*  --  2.97*  --  2.21*  --   --  1.93*  --   --   --   < > = values in this interval not displayed.  Estimated Creatinine Clearance: 77.5 ml/min (by C-G formula based on Cr of 1.07).    Assessment: 89 YOM presented to the ED with right leg ulcers and discoloration of right foot.  Pharmacy managing IV heparin for right foot ischemia.  He was also found to have elevated troponin, thought from recent STEMI.  He is s/p cardiac cath 5/27 followed by peripheral angiogram.  Cath findings: severe 2VCAD of mid to distal LAD and mid RCA, severe LV dysfunction. Medical management recommended. D/t low ef seen by LV gram there is concern for LV thrombus, heparin restarted until this can be ruled out by echo. Heparin level still undetectable this morning, will adjust and recheck level later tonight. No bleeding noted, cbc  remains stable.   Goal of Therapy:  Heparin level 0.3-0.7 units/ml Monitor platelets by anticoagulation protocol: Yes    Plan:  - Resume heparin drip at 2050 units/hr  - Check 8 hr HL at 1800 - Daily HL / CBC  Erin Hearing PharmD., BCPS Clinical Pharmacist Pager [phone] 08/09/2013 9:31 AM

## 2013-08-09 NOTE — Telephone Encounter (Signed)
lvm for pt regarding to June appt r/s for MD..Marland KitchenMarland Kitchen

## 2013-08-09 NOTE — Evaluation (Signed)
Physical Therapy Evaluation Patient Details Name: Fernando Lane MRN: [id number] DOB: 04-Mar-1951 Today's Date: 08/09/2013   History of Present Illness  63 yo male with right ichemic limb along with NSTEMI.    Clinical Impression  Pt admitted with the above. Pt currently with functional limitations due to the deficits listed below (see PT Problem List). At the time of PT eval, pt was able to stand and take a few side steps at EOB but had difficulty maintaining weight bearing through the RLE due to pain. Pt will benefit from skilled PT to increase their independence and safety with mobility to allow discharge to the venue listed below.      Follow Up Recommendations CIR;Supervision for mobility/OOB    Equipment Recommendations  Rolling walker with 5" wheels;3in1 (PT)    Recommendations for Other Services Rehab consult     Precautions / Restrictions Precautions Precautions: Fall Restrictions Weight Bearing Restrictions: No      Mobility  Bed Mobility Overal bed mobility: Needs Assistance Bed Mobility: Supine to Sit;Sit to Supine     Supine to sit: Min assist Sit to supine: Min assist   General bed mobility comments: VC's for sequencing and technique. Pt able to use bed rails for support initially and then grabbing for therapist's hand to pull up to sitting.   Transfers Overall transfer level: Needs assistance Equipment used: Rolling walker (2 wheeled) Transfers: Sit to/from Stand Sit to Stand: Min assist;Mod assist         General transfer comment: VC's for hand pleacement on seated surface for safety. Assist to power-up to full standing. Pt reports pain in plantar surface of R foot.   Ambulation/Gait Ambulation/Gait assistance: Min assist Ambulation Distance (Feet): 2 Feet Assistive device: Rolling walker (2 wheeled) Gait Pattern/deviations: Step-to pattern;Decreased stride length Gait velocity: Decreased Gait velocity interpretation: Below normal speed for  age/gender General Gait Details: Pt able to take small side steps towards the [facility] for positioning prior to transfer back to supine. Difficulty weight bearing through RLE even with use of RW.   Stairs            Wheelchair Mobility    Modified Rankin (Stroke Patients Only)       Balance Overall balance assessment: Needs assistance Sitting-balance support: Feet supported;No upper extremity supported Sitting balance-Leahy Scale: Fair     Standing balance support: Bilateral upper extremity supported Standing balance-Leahy Scale: Poor                               Pertinent Vitals/Pain Vitals stable throughout session.     Home Living Family/patient expects to be discharged to:: Private residence Living Arrangements: Alone Available Help at Discharge: Family;Friend(s);Available PRN/intermittently Type of Home: Apartment Home Access: Level entry     Home Layout: One level Home Equipment: Walker - 2 wheels;Wheelchair - manual      Prior Function Level of Independence: Independent with assistive device(s)         Comments: Driving, not working. Had someone go with him to the grocery store to carry bags to/from car     Hand Dominance   Dominant Hand: Right    Extremity/Trunk Assessment   Upper Extremity Assessment: Generalized weakness           Lower Extremity Assessment: Generalized weakness      Cervical / Trunk Assessment: Normal  Communication   Communication: No difficulties  Cognition Arousal/Alertness: Awake/alert Behavior During Therapy: [facility] for  tasks assessed/performed Overall Cognitive Status: Within Functional Limits for tasks assessed                      General Comments      Exercises        Assessment/Plan    PT Assessment Patient needs continued PT services  PT Diagnosis Difficulty walking;Acute pain   PT Problem List Decreased strength;Decreased range of motion;Decreased activity tolerance;Decreased  balance;Decreased mobility;Decreased knowledge of use of DME;Decreased knowledge of precautions;Decreased safety awareness  PT Treatment Interventions DME instruction;Gait training;Stair training;Functional mobility training;Therapeutic activities;Therapeutic exercise;Neuromuscular re-education;Patient/family education   PT Goals (Current goals can be found in the Care Plan section) Acute Rehab PT Goals Patient Stated Goal: To walk PT Goal Formulation: With patient Time For Goal Achievement: 08/23/13 Potential to Achieve Goals: Good    Frequency Min 3X/week   Barriers to discharge        Co-evaluation               End of Session Equipment Utilized During Treatment: Gait belt Activity Tolerance: Patient limited by pain Patient left: in bed;with bed alarm set;with call bell/phone within reach Nurse Communication: Mobility status         Time: 1101-1134 PT Time Calculation (min): 33 min   Charges:   PT Evaluation $Initial PT Evaluation Tier I: 1 Procedure PT Treatments $Gait Training: 8-22 mins $Therapeutic Activity: 8-22 mins   PT G CodesJolyn Lent 08/09/2013, 2:49 PM  Jolyn Lent, PT, DPT Acute Rehabilitation Services Pager: [phone]

## 2013-08-09 NOTE — Progress Notes (Addendum)
INITIAL NUTRITION ASSESSMENT  DOCUMENTATION CODES Per approved criteria  -Obesity Unspecified   INTERVENTION: Ensure Complete po BID, each supplement provides 350 kcal and 13 grams of protein RD to follow for nutrition care plan  NUTRITION DIAGNOSIS: Increased nutrient needs related to wound healing as evidenced by estimated nutrition needs   Goal: Pt to meet >/= 90% of their estimated nutrition needs   Monitor:  PO & supplemental intake, weight, labs, I/O's  Reason for Assessment: Malnutrition Screening Tool Report  63 y.o. male  Admitting Dx: Ischemia of extremity  ASSESSMENT: 63 yo with history of polycythemia vera, HTN, and OSA presented with probable inferolateral MI last Friday as well as evidence for ischemic foot (dusky toes) and CHF.  Patient s/p procedure 5/27: AORTOGRAM WITH BILATERAL EXTREMITY RUNOFF CARDIAC CATHETERIZATION  Patient reports his appetite is better, however, PO intake 25% per flowsheet records; for the past 4-5 months he reports only consuming 1 meal per day (meat, starch, veggie); he also endorses weight loss; per wt readings, pt has had a 6% weight loss since December 2014 -- not significant for time frame; amenable to chocolate Ensure Complete supplements; RD to order.     No muscle or subcutaneous fat depletion noticed.  CWOCN note reviewed 5/28 -- patient with right outer leg with 2 areas of full-thickness stasis ulcers and left knee with full thickness abrasion.  Height: Ht Readings from Last 1 Encounters:  08/08/13 5\' 5"  (1.651 m)    Weight: Wt Readings from Last 1 Encounters:  08/09/13 223 lb 12.3 oz (101.5 kg)    Ideal Body Weight: 136 lb  % Ideal Body Weight: 164%  Wt Readings from Last 10 Encounters:  08/09/13 223 lb 12.3 oz (101.5 kg)  08/09/13 223 lb 12.3 oz (101.5 kg)  03/02/13 238 lb 1.6 oz (108.001 kg)  01/24/13 230 lb (104.327 kg)  01/24/13 230 lb (104.327 kg)  12/20/12 233 lb (105.688 kg)  12/20/12 233 lb 12.8 oz  (106.051 kg)  06/26/12 253 lb 12.8 oz (115.123 kg)  01/28/12 238 lb (107.956 kg)  01/18/12 239 lb 8 oz (108.636 kg)    Usual Body Weight: 238 lb  % Usual Body Weight: 94%  BMI:  Body mass index is 37.24 kg/(m^2).  Estimated Nutritional Needs: Kcal: 1800-2000 Protein: 90-100 gm Fluid: 1.8-2.0 L  Skin:  Right outer leg with 2 areas of full-thickness stasis ulcers Left knee with full thickness abrasion   Diet Order: Cardiac  EDUCATION NEEDS: -No education needs identified at this time   Intake/Output Summary (Last 24 hours) at 08/09/13 1341 Last data filed at 08/09/13 0956  Gross per 24 hour  Intake 1082.5 ml  Output    600 ml  Net  482.5 ml    Labs:   Recent Labs Lab 08/07/13 1600 08/08/13 0258 08/09/13 0246  NA 140 139 138  K 4.4 4.0 4.0  CL 105 102 99  CO2 22 21 28   BUN 18 21 23   CREATININE 0.82 0.93 1.07  CALCIUM 9.1 8.9 8.8  GLUCOSE 111* 101* 96    CBG (last 3)   Recent Labs  08/08/13 1947  GLUCAP 152*    Scheduled Meds: . aspirin  325 mg Oral Daily  . atorvastatin  80 mg Oral q1800  . Chlorhexidine Gluconate Cloth  6 each Topical Q0600  . collagenase   Topical Daily  . folic acid  1 mg Oral Daily  . furosemide  60 mg Intravenous Q8H  . gabapentin  300 mg Oral TID  .  lisinopril  5 mg Oral Daily  . LORazepam  0-4 mg Oral Q6H   Followed by  . [START ON 08/11/2013] LORazepam  0-4 mg Oral Q12H  . multivitamin with minerals  1 tablet Oral Daily  . mupirocin ointment  1 application Nasal BID  . sodium chloride  3 mL Intravenous Q12H  . spironolactone  12.5 mg Oral Daily  . thiamine  100 mg Oral Daily   Or  . thiamine  100 mg Intravenous Daily    Continuous Infusions:   Past Medical History  Diagnosis Date  . Chronic low back pain   . 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 Chronic neck pain   . OSA (obstructive sleep apnea)     CPAP @ bedtime  . Allergy   . Unspecified essential  hypertension     Resistant, 2D Echo - EF-55-60  . Polycythemia   . CAD (coronary artery disease)     Vessel type unspecified  . PVD (peripheral vascular disease)   . DDD (degenerative disc disease), cervical     Past Surgical History  Procedure Laterality Date  . Lower extrmity doppler      [place] 2008; no evidence for PAD  . US echocardiography  11/2008    Normal valves, mild LAE  . Left knee replacement  10/2006  . Cervical spine surgery  02/2008  . Joint replacement    . Spine surgery      Arthur Holms, RD, LDN Pager #: [phone] After-Hours Pager #: [phone]

## 2013-08-09 NOTE — Progress Notes (Addendum)
  Echocardiogram 2D Echocardiogram with Definity has been performed.  Basilia Jumbo 08/09/2013, 10:05 AM

## 2013-08-09 NOTE — Progress Notes (Signed)
Placed pt. On cpap. Pt. Is tolerating well at this time. Pt. Has 2L of oxygen titrated into the cpap.  

## 2013-08-09 NOTE — Consult Note (Signed)
WOC wound consult note Reason for Consult: Consult requested for right leg wound.  Pt states this is chronic and he was followed by the outpatient wound care center several months ago but "he just stopped going."  VVS team following for revascularization procedure.   Wound type: Right outer leg with 2 areas of full-thickness stasis ulcers.  4.5X6X.3cm to upper right calf and 2X1.2X.2cm to middle right calf.  Both sites with large amt yellow drainage with strong odor, 30% red, 70% yellow.  Toes to right foot dark purple. Left knee with full thickness abrasion from several weeks ago, according to patient.  1.2X1.2X.2cm, 25% yellow, 75% red.  No odor or drainage. Periwound: Intact skin surrounding both wounds. Dressing procedure/placement/frequency: Santyl ointment to chemically debride non-viable tissue to left knee and right leg wounds.  Pt is willing to resume follow-up after discharge with the outpatient wound care center at [facility].  This must be by physician referral; please order if desired. Please re-consult if further assistance is needed.  Thank-you,  Julien Girt MSN, [place], [place], [place], [place]

## 2013-08-10 ENCOUNTER — Encounter (HOSPITAL_COMMUNITY)
Admission: EM | Disposition: A | Discharge status: Skilled Nursing Facility | Payer: Medicare Other | Source: Home / Self Care | Attending: Internal Medicine

## 2013-08-10 ENCOUNTER — Inpatient Hospital Stay (HOSPITAL_COMMUNITY): Payer: Medicare Other

## 2013-08-10 ENCOUNTER — Ambulatory Visit (HOSPITAL_COMMUNITY): Admit: 2013-08-10 | Payer: Self-pay | Admitting: Cardiovascular Disease

## 2013-08-10 DIAGNOSIS — R059 Cough, unspecified: Secondary | ICD-10-CM

## 2013-08-10 DIAGNOSIS — R05 Cough: Secondary | ICD-10-CM

## 2013-08-10 DIAGNOSIS — I319 Disease of pericardium, unspecified: Secondary | ICD-10-CM

## 2013-08-10 HISTORY — PX: PERICARDIAL TAP: SHX5486

## 2013-08-10 LAB — CBC
HEMATOCRIT: 46.2 % (ref 39.0–52.0)
HEMATOCRIT: 45.8 % (ref 39.0–52.0)
HEMOGLOBIN: 14.8 g/dL (ref 13.0–17.0)
HEMOGLOBIN: 14.6 g/dL (ref 13.0–17.0)
MCH: 27.2 pg (ref 26.0–34.0)
MCH: 27.2 pg (ref 26.0–34.0)
MCHC: 32 g/dL (ref 30.0–36.0)
MCHC: 31.9 g/dL (ref 30.0–36.0)
MCV: 84.8 fL (ref 78.0–100.0)
MCV: 85.3 fL (ref 78.0–100.0)
Platelets: 557 10*3/uL — ABNORMAL HIGH (ref 150–400)
Platelets: 687 10*3/uL — ABNORMAL HIGH (ref 150–400)
RBC: 5.45 MIL/uL (ref 4.22–5.81)
RBC: 5.37 MIL/uL (ref 4.22–5.81)
RDW: 19 % — ABNORMAL HIGH (ref 11.5–15.5)
RDW: 18.8 % — ABNORMAL HIGH (ref 11.5–15.5)
WBC: 19.9 10*3/uL — ABNORMAL HIGH (ref 4.0–10.5)
WBC: 20.5 10*3/uL — ABNORMAL HIGH (ref 4.0–10.5)

## 2013-08-10 LAB — BASIC METABOLIC PANEL
BUN: 26 mg/dL — AB (ref 6–23)
CHLORIDE: 100 meq/L (ref 96–112)
CO2: 26 mEq/L (ref 19–32)
Calcium: 9 mg/dL (ref 8.4–10.5)
Creatinine, Ser: 0.92 mg/dL (ref 0.50–1.35)
GFR calc non Af Amer: 88 mL/min — ABNORMAL LOW (ref >90)
Glucose, Bld: 107 mg/dL — ABNORMAL HIGH (ref 70–99)
Potassium: 3.5 mEq/L — ABNORMAL LOW (ref 3.7–5.3)
SODIUM: 140 meq/L (ref 137–147)

## 2013-08-10 LAB — BODY FLUID CELL COUNT WITH DIFFERENTIAL
Eos, Fluid: 0 %
Lymphs, Fluid: 44 %
Monocyte-Macrophage-Serous Fluid: 31 % — ABNORMAL LOW (ref 50–90)
Neutrophil Count, Fluid: 25 % (ref 0–25)
Other Cells, Fluid: 0 %
Total Nucleated Cell Count, Fluid: 1673 uL — ABNORMAL HIGH (ref 0–1000)

## 2013-08-10 LAB — LACTATE DEHYDROGENASE, PLEURAL OR PERITONEAL FLUID: LD FL: 1600 U/L — AB (ref 3–23)

## 2013-08-10 SURGERY — PERICARDIAL TAP
Anesthesia: LOCAL

## 2013-08-10 MED ORDER — FUROSEMIDE 10 MG/ML IJ SOLN
INTRAMUSCULAR | Status: AC
  Filled 2013-08-10: qty 8

## 2013-08-10 MED ORDER — BENZONATATE 100 MG PO CAPS
100.0000 mg | ORAL_CAPSULE | Freq: Three times a day (TID) | ORAL | Status: DC | PRN
  Administered 2013-08-14: 100 mg via ORAL
  Filled 2013-08-10: qty 1

## 2013-08-10 MED ORDER — SODIUM CHLORIDE 0.9 % IV BOLUS (SEPSIS)
500.0000 mL | Freq: Once | INTRAVENOUS | Status: AC
  Administered 2013-08-10: 500 mL via INTRAVENOUS

## 2013-08-10 MED ORDER — POTASSIUM CHLORIDE CRYS ER 20 MEQ PO TBCR
40.0000 meq | EXTENDED_RELEASE_TABLET | Freq: Every day | ORAL | Status: DC
  Filled 2013-08-10 (×2): qty 2

## 2013-08-10 MED ORDER — POTASSIUM CHLORIDE CRYS ER 20 MEQ PO TBCR
40.0000 meq | EXTENDED_RELEASE_TABLET | Freq: Two times a day (BID) | ORAL | Status: AC
  Administered 2013-08-10 (×2): 40 meq via ORAL
  Filled 2013-08-10: qty 2

## 2013-08-10 MED ORDER — HEPARIN (PORCINE) IN NACL 2-0.9 UNIT/ML-% IJ SOLN
INTRAMUSCULAR | Status: AC
  Filled 2013-08-10: qty 1000

## 2013-08-10 MED ORDER — OXYCODONE-ACETAMINOPHEN 5-325 MG PO TABS
1.0000 | ORAL_TABLET | ORAL | Status: DC | PRN
  Administered 2013-08-10: 1 via ORAL
  Administered 2013-08-11 – 2013-08-12 (×5): 2 via ORAL
  Administered 2013-08-12: 1 via ORAL
  Administered 2013-08-12 – 2013-08-17 (×15): 2 via ORAL
  Filled 2013-08-10 (×18): qty 2
  Filled 2013-08-10: qty 1
  Filled 2013-08-10 (×3): qty 2
  Filled 2013-08-10: qty 1

## 2013-08-10 MED ORDER — FUROSEMIDE 40 MG PO TABS: 40.0000 mg | ORAL_TABLET | Freq: Two times a day (BID) | ORAL | Status: DC

## 2013-08-10 MED ORDER — HEPARIN SODIUM (PORCINE) 5000 UNIT/ML IJ SOLN
5000.0000 [IU] | Freq: Three times a day (TID) | INTRAMUSCULAR | Status: DC
  Administered 2013-08-10 – 2013-08-17 (×20): 5000 [IU] via SUBCUTANEOUS
  Filled 2013-08-10 (×24): qty 1

## 2013-08-10 MED ORDER — FUROSEMIDE 10 MG/ML IJ SOLN
60.0000 mg | Freq: Three times a day (TID) | INTRAMUSCULAR | Status: DC
  Administered 2013-08-10 (×2): 60 mg via INTRAVENOUS
  Filled 2013-08-10 (×3): qty 6

## 2013-08-10 MED ORDER — CARVEDILOL 3.125 MG PO TABS
3.1250 mg | ORAL_TABLET | Freq: Two times a day (BID) | ORAL | Status: DC
  Administered 2013-08-10 – 2013-08-11 (×4): 3.125 mg via ORAL
  Filled 2013-08-10 (×7): qty 1

## 2013-08-10 MED ORDER — LIDOCAINE HCL (PF) 1 % IJ SOLN
INTRAMUSCULAR | Status: AC
  Filled 2013-08-10: qty 30

## 2013-08-10 NOTE — Progress Notes (Signed)
PT Cancellation Note  Patient Details Name: Fernando Lane MRN: [id number] DOB: [birth year]   Cancelled Treatment:    Reason Eval/Treat Not Completed: Medical issues which prohibited therapy. Noted pt s/p pericardial drain today.    Jeanie Cooks [name] 08/10/2013, 5:21 PM Pager [phone]

## 2013-08-10 NOTE — Progress Notes (Addendum)
Clinical Social Work Department CLINICAL SOCIAL WORK PLACEMENT NOTE 08/10/2013  Patient:  Fernando Lane, Fernando Lane  Account Number:  [id number] Admit date:  08/07/2013  Clinical Social Worker:  Ky Barban, Latanya Presser  Date/time:  08/10/2013 12:15 PM  Clinical Social Work is seeking post-discharge placement for this patient at the following level of care:   [facility]   (*CSW will update this form in Epic as items are completed)   08/10/2013  Patient/family provided with [facility] Department of Clinical Social Work's list of facilities offering this level of care within the geographic area requested by the patient (or if unable, by the patient's family).  08/10/2013  Patient/family informed of their freedom to choose among providers that offer the needed level of care, that participate in Medicare, Medicaid or managed care program needed by the patient, have an available bed and are willing to accept the patient.  08/10/2013  Patient/family informed of [facility]' ownership interest in [facility], as well as of the fact that they are under no obligation to receive care at this facility.  PASARR submitted to EDS on 08/10/2013 PASARR number received from EDS on 08/10/2013  FL2 transmitted to all facilities in geographic area requested by pt/family on  08/10/2013 FL2 transmitted to all facilities within larger geographic area on   Patient informed that his/her managed care company has contracts with or will negotiate with  certain facilities, including the following:     Patient/family informed of bed offers received:  08/10/2013 Patient chooses bed at [facility] Physician recommends and patient chooses bed at    Patient to be transferred to [facility] on  08/17/13 Patient to be transferred to facility by Facility will provide transportation.  The following physician request were entered in Epic:   Additional Comments:   Ky Barban, MSW,  [place] Social Worker [phone]

## 2013-08-10 NOTE — Progress Notes (Signed)
  Echocardiogram 2D Echocardiogram (stat limited) has been performed.  Basilia Jumbo 08/10/2013, 2:16 PM

## 2013-08-10 NOTE — Progress Notes (Addendum)
Paged by RN that patient's blood pressure has been trending down all day and now is in the 60s. Came to see patient and he reports being weak and SOB. HR 90-100s. He has had recent ECHO with pericardial effusion will get stat ECHO now and check CBC. Give 500 NS bolus. Cancel transfer.  Stat ECHO performed and mod/large pericardial effusion that Dr. Aundra Dubin and Dr. Angelena Form reviewed. Will consent patient and take him to cath lab to have drained  Rande Brunt NP-C 1:35 PM

## 2013-08-10 NOTE — Progress Notes (Addendum)
[facility] PROGRESS NOTE  Fernando Lane TGG:[id number] DOB: May 21, 1950 DOA: 08/07/2013 PCP: Gwendolyn Grant, MD  Assessment/Plan: 1. STEMI 1. Cardiology following 2. Pt now s/p heart cath revealing severe 2 vessel occlusive dz involving mid-dist LAD and RCA with LVEF of 20%. Recs for med management w/ ASA, statin 3. Heparin stopped 4. No LV thrombus on echo 5. RV infarction on echo 2. RLE ischemia 1. Vascular surgery following 2. On ASA and heparin gtt 3. S/p aortogram 5/27 with arterial flow noted - no further recs and vascular surgery has since signed off 5/28 3. HTN 1. BP stable, controlled 4. HLD 1. On statin 5. Polycythemia 1. Hgb remains w/in normal limits  6. Acute systolic CHF 1. On lasix, dose increased to 60mg  iv q8hrs 5/28 2. Spironolactone added, increased to 25mg , beta blocker on hold 3. Net neg 473cc per flow sheet 4. Wt of 100.1kg 7. Large pericardial effusion 1. Clinically stable for now 2. Follow 3. Cardiology recs for repeat echo on 6/1 8. DVT prophylaxis 1. Heparin gtt stopped, will cont with subq prophylactic heparin 9. Cough 1. Sounds dry, however pt reports some secretions 2. Mildly elevated WBC 3. Will check CXR to r/o PNA 4. If neg, question ACEI related cough 5. Tessalon perles for now 10. Stasis Ulcers 1. Good granulation with no surrounding erythema 2. Monitor for now  Code Status: Full Family Communication: Pt and son at bedside Disposition Plan: Pending   Consultants:  Vascular surgery  Cardiology  Procedures:  Heart cath 5/27  Aortogram 5/27  Antibiotics: none  HPI/Subjective: No complaints. Feels well.  Objective: Filed Vitals:   08/10/13 0500 08/10/13 0600 08/10/13 0700 08/10/13 0738  BP: 104/68 119/79 122/83   Pulse: 101 102 109   Temp:    97 F (36.1 C)  TempSrc:    Axillary  Resp: 21 19 22    Height:      Weight: 100.1 kg (220 lb 10.9 oz)     SpO2: 96% 94% 91%     Intake/Output Summary (Last 24  hours) at 08/10/13 0901 Last data filed at 08/10/13 0500  Gross per 24 hour  Intake 630.23 ml  Output   1150 ml  Net -519.77 ml   Filed Weights   08/08/13 0153 08/09/13 0500 08/10/13 0500  Weight: 102.9 kg (226 lb 13.7 oz) 101.5 kg (223 lb 12.3 oz) 100.1 kg (220 lb 10.9 oz)    Exam:   General:  Awake, in nad  Cardiovascular: regular, s1, s2  Respiratory: normal resp effort, no wheezing  Abdomen: soft, obese, pos bs  Musculoskeletal: palpable DP on R, L foot cold to touch   Data Reviewed: Basic Metabolic Panel:  Recent Labs Lab 08/07/13 1600 08/08/13 0258 08/09/13 0246 08/10/13 0402  NA 140 139 138 140  K 4.4 4.0 4.0 3.5*  CL 105 102 99 100  CO2 22 21 28 26   GLUCOSE 111* 101* 96 107*  BUN 18 21 23  26*  CREATININE 0.82 0.93 1.07 0.92  CALCIUM 9.1 8.9 8.8 9.0   Liver Function Tests:  Recent Labs Lab 08/07/13 1600  AST 20  ALT 14  ALKPHOS 186*  BILITOT 1.4*  PROT 6.8  ALBUMIN 3.1*   No results found for this basename: LIPASE, AMYLASE,  in the last 168 hours No results found for this basename: AMMONIA,  in the last 168 hours CBC:  Recent Labs Lab 08/07/13 1600 08/08/13 0500 08/09/13 0246 08/10/13 0402  WBC 16.2* 18.0* 17.1* 19.9*  NEUTROABS 14.7*  --   --   --  HGB 15.3 14.6 14.5 14.8  HCT 48.2 45.1 45.5 46.2  MCV 83.5 84.0 84.9 84.8  PLT 410* 376 499* 557*   Cardiac Enzymes:  Recent Labs Lab 08/07/13 1600 08/08/13 0258 08/08/13 0914 08/08/13 2000  CKTOTAL 63  --   --   --   TROPONINI 1.90* 2.97* 2.21* 1.93*   BNP (last 3 results)  Recent Labs  08/07/13 1600  PROBNP 13375.0*   CBG:  Recent Labs Lab 08/08/13 1947  GLUCAP 152*    Recent Results (from the past 240 hour(s))  CULTURE, BLOOD (ROUTINE X 2)     Status: None   Collection Time    08/07/13  5:10 PM      Result Value Ref Range Status   Specimen Description BLOOD ARM LEFT   Final   Special Requests BOTTLES DRAWN AEROBIC AND ANAEROBIC 5CC   Final   Culture  Setup  Time     Final   Value: 08/07/2013 23:11     Performed at [facility]   Culture     Final   Value:        BLOOD CULTURE RECEIVED NO GROWTH TO DATE CULTURE WILL BE HELD FOR 5 DAYS BEFORE ISSUING A FINAL NEGATIVE REPORT     Performed at [facility]   Report Status PENDING   Incomplete  CULTURE, BLOOD (ROUTINE X 2)     Status: None   Collection Time    08/07/13  5:16 PM      Result Value Ref Range Status   Specimen Description BLOOD HAND LEFT   Final   Special Requests BOTTLES DRAWN AEROBIC ONLY 5CC   Final   Culture  Setup Time     Final   Value: 08/07/2013 23:11     Performed at [facility]   Culture     Final   Value:        BLOOD CULTURE RECEIVED NO GROWTH TO DATE CULTURE WILL BE HELD FOR 5 DAYS BEFORE ISSUING A FINAL NEGATIVE REPORT     Performed at [facility]   Report Status PENDING   Incomplete  URINE CULTURE     Status: None   Collection Time    08/07/13  8:24 PM      Result Value Ref Range Status   Specimen Description URINE, CLEAN CATCH   Final   Special Requests NONE   Final   Culture  Setup Time     Final   Value: 08/07/2013 21:37     Performed at [facility]     Final   Value: 3,000 COLONIES/ML     Performed at [facility]   Culture     Final   Value: INSIGNIFICANT GROWTH     Performed at [facility]   Report Status 08/08/2013 FINAL   Final  MRSA PCR SCREENING     Status: Abnormal   Collection Time    08/08/13  1:59 AM      Result Value Ref Range Status   MRSA by PCR POSITIVE (*) NEGATIVE Final   Comment:            The GeneXpert MRSA Assay (FDA     approved for NASAL specimens     only), is one component of a     comprehensive MRSA colonization     surveillance program. It is not     intended to diagnose MRSA     infection nor  to guide or     monitor treatment for     MRSA infections.     RESULT CALLED TO, READ BACK BY AND VERIFIED WITH:     H.RICHARD RN [phone] 08/08/13 E.GADDY      Studies: No results found.  Scheduled Meds: . aspirin  325 mg Oral Daily  . atorvastatin  80 mg Oral q1800  . carvedilol  3.125 mg Oral BID WC  . Chlorhexidine Gluconate Cloth  6 each Topical Q0600  . collagenase   Topical Daily  . feeding supplement (ENSURE COMPLETE)  237 mL Oral BID BM  . folic acid  1 mg Oral Daily  . furosemide  60 mg Intravenous TID  . gabapentin  300 mg Oral TID  . lisinopril  5 mg Oral Daily  . LORazepam  0-4 mg Oral Q6H   Followed by  . [START ON 08/11/2013] LORazepam  0-4 mg Oral Q12H  . multivitamin with minerals  1 tablet Oral Daily  . mupirocin ointment  1 application Nasal BID  . sodium chloride  3 mL Intravenous Q12H  . spironolactone  12.5 mg Oral Daily  . thiamine  100 mg Oral Daily   Or  . thiamine  100 mg Intravenous Daily   Continuous Infusions:    Principal Problem:   Ischemia of extremity right leg Active Problems:   HYPERLIPIDEMIA-MIXED   POLYCYTHEMIA   HYPERTENSION   PERIPHERAL VASCULAR DISEASE   Leg ulcer   ST elevation myocardial infarction (STEMI) in recovery phase   Coronary atherosclerosis of native coronary artery   Acute systolic heart failure   CAD (coronary artery disease)   Time spent: 67min  Panagiotis Oelkers K Treniya Lobb  [facility] Pager [phone]. If 7PM-7AM, please contact night-coverage at [email or web address], password [facility] 08/10/2013, 9:01 AM  LOS: 3 days

## 2013-08-10 NOTE — CV Procedure (Signed)
     Cardiac Catheterization Operative Report  Fernando Lane [id number] 5/29/20153:13 PM Gwendolyn Grant, MD  Procedure Performed:  1. Pericardiocentesis    Operator: Lauree Chandler, MD  Indication:  63 yo male post MI with occluded RCA and LAD, no options for PCI or CABG. Post-MI pericardial effusion with evidence of hemodynamic instability today. Echo with tamponade.                                    Procedure Details: The risks, benefits, complications, treatment options, and expected outcomes were discussed with the patient. The patient and/or family concurred with the proposed plan, giving informed consent. The patient was brought to the cath lab. No sedation was given. The chest wall was prepped and draped in a sterile fashion. 1% lidocaine was used for local anesthesia. I then advanced a long pericardiocentesis needle under the sub-xyphoid process. I entered the pericardial space and advanced a wire into the pericardial space. I then advanced a pericardial drain into the pericardium. 825 cc of straw colored fluid removed. The drain was sewn into place. There were no immediate complications. The patient was taken to the recovery area in stable condition.   Hemodynamic Findings: Central aortic pressure: 92/60  Impression: 1. Pericardial tamponade 2. Successful placement of a pericardial drain via sub-xyphoid approach  Recommendations: Will leave drain to gravity. I suspect I removed most of the fluid. If no significant output overnight, would pull pericardial drain in the am.        Complications:  None; patient tolerated the procedure well.
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
## 2013-08-10 NOTE — Progress Notes (Signed)
  Echocardiogram 2D Echocardiogram (stat limited s/p drain) has been performed.  Basilia Jumbo 08/10/2013, 4:48 PM

## 2013-08-10 NOTE — Progress Notes (Signed)
Patient's BP's dropped to low 64'P systolic. Patient states he feels "out of energy" but otherwise is asymptomatic. HR 100-110's. Dr. Tommi Rumps made aware and at bedside. Orders for 500cc bolus given. Will continue to monitor closely.
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
## 2013-08-10 NOTE — Interval H&P Note (Signed)
History and Physical Interval Note:  08/10/2013 2:45 PM  Fernando Lane  has presented today for pericardiocentesis with the diagnosis of pericardial effusion/tamponade. The various methods of treatment have been discussed with the patient and family. After consideration of risks, benefits and other options for treatment, the patient has consented to  Procedure(s): PERICARDIAL TAP (N/A) as a surgical intervention .  The patient's history has been reviewed, patient examined, no change in status, stable for surgery.  I have reviewed the patient's chart and labs.  Questions were answered to the patient's satisfaction.     Burnell Blanks

## 2013-08-10 NOTE — Progress Notes (Signed)
Clinical Social Work Department BRIEF PSYCHOSOCIAL ASSESSMENT 08/10/2013  Patient:  Fernando Lane, Fernando Lane     Account Number:  [id number]     Admit date:  08/07/2013  Clinical Social Worker:  Freeman Caldron  Date/Time:  08/10/2013 12:10 PM  Referred by:  Physician  Date Referred:  08/10/2013 Referred for  SNF Placement   Other Referral:   Interview type:  Patient Other interview type:    PSYCHOSOCIAL DATA Living Status:  ALONE Admitted from facility:   Level of care:   Primary support name:  Kade Demicco [phone]) Primary support relationship to patient:  CHILD, ADULT Degree of support available:   Good--pt states he lives at home alone and has support from son.    CURRENT CONCERNS Current Concerns  Post-Acute Placement   Other Concerns:    SOCIAL WORK ASSESSMENT / PLAN PT recommending short-term rehab. CSW spoke with pt about recommendation and pt agreeable to referrals to SNFs in [facility]. Pt spoke with liaison with [facility], and they have offered a private room for pt. Pt accepted this offer, and CSW is following to coordinate discharge when pt is deemed medically ready. FL2 on pt's chart.   Assessment/plan status:  Psychosocial Support/Ongoing Assessment of Needs Other assessment/ plan:   Information/referral to community resources:   SNF [facility])    PATIENT'S/FAMILY'S RESPONSE TO PLAN OF CARE: Good--pt participated in conversation with CSW and understands recommendation for rehab. Asked to talk with [facility] about home health, and RNCM has spoken with pt about this. Likely will discharge to [place] for short-term rehab before returning home.       Ky Barban, MSW, [facility] Clinical Social Worker [phone]

## 2013-08-11 DIAGNOSIS — I319 Disease of pericardium, unspecified: Secondary | ICD-10-CM

## 2013-08-11 LAB — CBC
HCT: 44.9 % (ref 39.0–52.0)
Hemoglobin: 14.5 g/dL (ref 13.0–17.0)
MCH: 27.3 pg (ref 26.0–34.0)
MCHC: 32.3 g/dL (ref 30.0–36.0)
MCV: 84.6 fL (ref 78.0–100.0)
Platelets: 569 10*3/uL — ABNORMAL HIGH (ref 150–400)
RBC: 5.31 MIL/uL (ref 4.22–5.81)
RDW: 18.7 % — ABNORMAL HIGH (ref 11.5–15.5)
WBC: 15.9 10*3/uL — ABNORMAL HIGH (ref 4.0–10.5)

## 2013-08-11 LAB — BASIC METABOLIC PANEL
BUN: 38 mg/dL — ABNORMAL HIGH (ref 6–23)
CALCIUM: 8.5 mg/dL (ref 8.4–10.5)
CO2: 22 meq/L (ref 19–32)
Chloride: 103 mEq/L (ref 96–112)
Creatinine, Ser: 1.32 mg/dL (ref 0.50–1.35)
GFR calc Af Amer: 65 mL/min — ABNORMAL LOW (ref >90)
GFR calc non Af Amer: 56 mL/min — ABNORMAL LOW (ref >90)
GLUCOSE: 117 mg/dL — AB (ref 70–99)
Potassium: 4.7 mEq/L (ref 3.7–5.3)
Sodium: 139 mEq/L (ref 137–147)

## 2013-08-11 MED ORDER — BIOTENE DRY MOUTH MT LIQD
15.0000 mL | Freq: Two times a day (BID) | OROMUCOSAL | Status: DC
  Administered 2013-08-11 – 2013-08-17 (×7): 15 mL via OROMUCOSAL

## 2013-08-11 MED ORDER — CHLORHEXIDINE GLUCONATE 0.12 % MT SOLN
15.0000 mL | Freq: Two times a day (BID) | OROMUCOSAL | Status: DC
  Administered 2013-08-11 – 2013-08-16 (×8): 15 mL via OROMUCOSAL
  Filled 2013-08-11 (×14): qty 15

## 2013-08-11 MED ORDER — CLOPIDOGREL BISULFATE 75 MG PO TABS
75.0000 mg | ORAL_TABLET | Freq: Every day | ORAL | Status: DC
  Administered 2013-08-11 – 2013-08-17 (×7): 75 mg via ORAL
  Filled 2013-08-11 (×8): qty 1

## 2013-08-11 MED ORDER — COLCHICINE 0.6 MG PO TABS
0.6000 mg | ORAL_TABLET | Freq: Two times a day (BID) | ORAL | Status: DC
  Administered 2013-08-11 – 2013-08-15 (×10): 0.6 mg via ORAL
  Filled 2013-08-11 (×12): qty 1

## 2013-08-11 MED ORDER — COLCHICINE 0.6 MG PO TABS
0.6000 mg | ORAL_TABLET | Freq: Every day | ORAL | Status: DC
  Filled 2013-08-11: qty 1

## 2013-08-11 MED ORDER — FUROSEMIDE 40 MG PO TABS
40.0000 mg | ORAL_TABLET | Freq: Every day | ORAL | Status: DC
  Administered 2013-08-11 – 2013-08-12 (×2): 40 mg via ORAL
  Filled 2013-08-11 (×3): qty 1
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
## 2013-08-11 NOTE — Progress Notes (Signed)
CARDIAC REHAB PHASE I   PRE:  Rate/Rhythm: 101 St  BP:  Sitting: 121/72      SaO2: 96 2L  MODE:  Ambulation: 0 ft, only able to stand for approx 15 seconds   POST:  Rate/Rhythm: 114 ST  BP:  Sitting: 123/65     SaO2: 93 RA 1530-1555 Attempted to ambulate patient with RW. 2 assist needed to help patient to stand. Once standing with RW, patient unable to remain on feet. Stood in a slumped position for less than 15 seconds with much assist and encouragement. Asked patient if he ambulated at home. Patient stated he has been wheelchair bound x 6 months. It is noted that patient does live by himself and when questioned, patient stated that he is unable to take care of toileting and often soils himself because of his inability to transfer from chair to toilet. Will continue to follow patient for education, relying on PT for guidance with ambulation.   Ameliah Baskins English PayneRN, BSN 08/11/2013 4:10 PM

## 2013-08-11 NOTE — Progress Notes (Signed)
RN placed patient on CPAP tonight. Patient tolerating well with no problems. RT will continue to monitor.

## 2013-08-11 NOTE — Progress Notes (Signed)
Pt pulled off dressing of pericardial drain while asleep. Site appeared clean and dry and tubing remained in place. Dr. Tommi Rumps at bedside to redress drain.

## 2013-08-11 NOTE — Progress Notes (Signed)
[facility] PROGRESS NOTE  Fernando Lane ZCH:[id number] DOB: Feb 27, 1951 DOA: 08/07/2013 PCP: Gwendolyn Grant, MD  Assessment/Plan: 1. STEMI 1. Cardiology following 2. Pt now s/p heart cath revealing severe 2 vessel occlusive dz involving mid-dist LAD and RCA with LVEF of 20%. Recs for med management w/ ASA, statin 3. Heparin was stopped. Plavix added to ASA 5/30 4. No LV thrombus on echo but RV infarction demonstrated 5. Not PCI or CABG candidate 2. RLE ischemia 1. Vascular surgery following 2. On ASA 3. S/p aortogram 5/27 with arterial flow noted - no further surgical recs and vascular surgery has since signed off 5/28 3. HTN 1. BP currently stable, controlled 4. HLD 1. On statin 5. Polycythemia 1. Hgb remains w/in normal limits  6. Acute systolic CHF 1. On lasix, changed to PO 5/30 secondary to rising Cr 2. Spironolactone added, increased to 25mg , beta blocker on hold 3. Net pos 700cc per flow sheet 4. Wt of 100.1kg ->101.2kg 7. Large pericardial effusion 1. Decompensated 5/29, prompting emergent pericardiocentesis 2. Clinically improved since procedure 8. DVT prophylaxis 1. Heparin gtt stopped, will cont with subq prophylactic heparin 9. Cough 1. Mildly elevated WBC 2. CXR with findings suggestive of pulm edema 3. Tessalon perles for now 10. Stasis Ulcers 1. Good granulation with no surrounding erythema 2. Monitor for now 3. Cont dressing changes  Code Status: Full Family Communication: Pt in room Disposition Plan: Pending   Consultants:  Vascular surgery  Cardiology  Procedures:  Heart cath 5/27  Aortogram 5/27  Emergent Pericardiocentesis 5/29  Antibiotics: none  HPI/Subjective: Pt feels better. No complaints  Objective: Filed Vitals:   08/11/13 0500 08/11/13 0600 08/11/13 0700 08/11/13 0755  BP:  126/72 121/73   Pulse: 106 103 106   Temp:    97.8 F (36.6 C)  TempSrc:    Oral  Resp: 14 25 19    Height:      Weight:      SpO2:  96% 99% 90%     Intake/Output Summary (Last 24 hours) at 08/11/13 0809 Last data filed at 08/11/13 0500  Gross per 24 hour  Intake   1306 ml  Output    315 ml  Net    991 ml   Filed Weights   08/09/13 0500 08/10/13 0500 08/11/13 0452  Weight: 101.5 kg (223 lb 12.3 oz) 100.1 kg (220 lb 10.9 oz) 101.2 kg (223 lb 1.7 oz)    Exam:   General:  Awake, in nad  Cardiovascular: regular, s1, s2  Respiratory: normal resp effort, no wheezing  Abdomen: soft, obese, pos bs  Musculoskeletal: palpable DP on R, L foot cold to touch   Data Reviewed: Basic Metabolic Panel:  Recent Labs Lab 08/07/13 1600 08/08/13 0258 08/09/13 0246 08/10/13 0402 08/11/13 0242  NA 140 139 138 140 139  K 4.4 4.0 4.0 3.5* 4.7  CL 105 102 99 100 103  CO2 22 21 28 26 22   GLUCOSE 111* 101* 96 107* 117*  BUN 18 21 23  26* 38*  CREATININE 0.82 0.93 1.07 0.92 1.32  CALCIUM 9.1 8.9 8.8 9.0 8.5   Liver Function Tests:  Recent Labs Lab 08/07/13 1600  AST 20  ALT 14  ALKPHOS 186*  BILITOT 1.4*  PROT 6.8  ALBUMIN 3.1*   No results found for this basename: LIPASE, AMYLASE,  in the last 168 hours No results found for this basename: AMMONIA,  in the last 168 hours CBC:  Recent Labs Lab 08/07/13 1600 08/08/13 0500 08/09/13 0246  08/10/13 0402 08/10/13 1325 08/11/13 0242  WBC 16.2* 18.0* 17.1* 19.9* 20.5* 15.9*  NEUTROABS 14.7*  --   --   --   --   --   HGB 15.3 14.6 14.5 14.8 14.6 14.5  HCT 48.2 45.1 45.5 46.2 45.8 44.9  MCV 83.5 84.0 84.9 84.8 85.3 84.6  PLT 410* 376 499* 557* 687* 569*   Cardiac Enzymes:  Recent Labs Lab 08/07/13 1600 08/08/13 0258 08/08/13 0914 08/08/13 2000  CKTOTAL 63  --   --   --   TROPONINI 1.90* 2.97* 2.21* 1.93*   BNP (last 3 results)  Recent Labs  08/07/13 1600  PROBNP 13375.0*   CBG:  Recent Labs Lab 08/08/13 1947  GLUCAP 152*    Recent Results (from the past 240 hour(s))  CULTURE, BLOOD (ROUTINE X 2)     Status: None   Collection Time     08/07/13  5:10 PM      Result Value Ref Range Status   Specimen Description BLOOD ARM LEFT   Final   Special Requests BOTTLES DRAWN AEROBIC AND ANAEROBIC 5CC   Final   Culture  Setup Time     Final   Value: 08/07/2013 23:11     Performed at [facility]   Culture     Final   Value:        BLOOD CULTURE RECEIVED NO GROWTH TO DATE CULTURE WILL BE HELD FOR 5 DAYS BEFORE ISSUING A FINAL NEGATIVE REPORT     Performed at [facility]   Report Status PENDING   Incomplete  CULTURE, BLOOD (ROUTINE X 2)     Status: None   Collection Time    08/07/13  5:16 PM      Result Value Ref Range Status   Specimen Description BLOOD HAND LEFT   Final   Special Requests BOTTLES DRAWN AEROBIC ONLY 5CC   Final   Culture  Setup Time     Final   Value: 08/07/2013 23:11     Performed at [facility]   Culture     Final   Value:        BLOOD CULTURE RECEIVED NO GROWTH TO DATE CULTURE WILL BE HELD FOR 5 DAYS BEFORE ISSUING A FINAL NEGATIVE REPORT     Performed at [facility]   Report Status PENDING   Incomplete  URINE CULTURE     Status: None   Collection Time    08/07/13  8:24 PM      Result Value Ref Range Status   Specimen Description URINE, CLEAN CATCH   Final   Special Requests NONE   Final   Culture  Setup Time     Final   Value: 08/07/2013 21:37     Performed at [place]     Final   Value: 3,000 COLONIES/ML     Performed at [facility]   Culture     Final   Value: INSIGNIFICANT GROWTH     Performed at [facility]   Report Status 08/08/2013 FINAL   Final  MRSA PCR SCREENING     Status: Abnormal   Collection Time    08/08/13  1:59 AM      Result Value Ref Range Status   MRSA by PCR POSITIVE (*) NEGATIVE Final   Comment:            The GeneXpert MRSA Assay (FDA     approved for NASAL  specimens     only), is one component of a     comprehensive MRSA colonization     surveillance program. It is not     intended to  diagnose MRSA     infection nor to guide or     monitor treatment for     MRSA infections.     RESULT CALLED TO, READ BACK BY AND VERIFIED WITH:     H.RICHARD RN [phone] 08/08/13 E.GADDY     Studies: Dg Chest Port 1 View  08/10/2013   CLINICAL DATA:  Cough.  EXAM: PORTABLE CHEST - 1 VIEW  COMPARISON:  Chest radiograph Aug 07, 2013  FINDINGS: The cardiac silhouette remains severely enlarged, with somewhat widened mediastinal which may be attributable to AP lordotic technique, unchanged. Similar central pulmonary vasculature congestion and interstitial prominence in this low inspiratory portable examination. Slight blunting of the costophrenic angles. Strandy densities left lung base. No pneumothorax.  Partially imaged ACDF. Severe acromioclavicular osteoarthrosis. Mild degenerative change of thoracic spine with dextroscoliosis. Multiple EKG lines overlie the patient and may obscure subtle underlying pathology.  IMPRESSION: Stable cardiomegaly, mild interstitial prominence may reflect pulmonary edema with left lung base atelectasis and trace bilateral costophrenic angle pleural thickening versus pleural effusions.   Electronically Signed   By: Elon Alas   On: 08/10/2013 12:40    Scheduled Meds: . aspirin  325 mg Oral Daily  . atorvastatin  80 mg Oral q1800  . carvedilol  3.125 mg Oral BID WC  . Chlorhexidine Gluconate Cloth  6 each Topical Q0600  . clopidogrel  75 mg Oral Q breakfast  . colchicine  0.6 mg Oral BID  . collagenase   Topical Daily  . feeding supplement (ENSURE COMPLETE)  237 mL Oral BID BM  . folic acid  1 mg Oral Daily  . furosemide  40 mg Oral Daily  . gabapentin  300 mg Oral TID  . heparin subcutaneous  5,000 Units Subcutaneous 3 times per day  . lisinopril  5 mg Oral Daily  . LORazepam  0-4 mg Oral Q6H   Followed by  . LORazepam  0-4 mg Oral Q12H  . multivitamin with minerals  1 tablet Oral Daily  . mupirocin ointment  1 application Nasal BID  . sodium chloride  3  mL Intravenous Q12H  . spironolactone  12.5 mg Oral Daily  . thiamine  100 mg Oral Daily   Or  . thiamine  100 mg Intravenous Daily   Continuous Infusions:    Principal Problem:   Ischemia of extremity right leg Active Problems:   HYPERLIPIDEMIA-MIXED   POLYCYTHEMIA   HYPERTENSION   PERIPHERAL VASCULAR DISEASE   Leg ulcer   ST elevation myocardial infarction (STEMI) in recovery phase   Coronary atherosclerosis of native coronary artery   Acute systolic heart failure   CAD (coronary artery disease)   Time spent: 17min  Stephen K Chiu  [facility] Pager [phone]. If 7PM-7AM, please contact night-coverage at [email or web address], password [facility] 08/11/2013, 8:09 AM  LOS: 4 days

## 2013-08-12 LAB — CBC
HEMATOCRIT: 47.9 % (ref 39.0–52.0)
Hemoglobin: 14.9 g/dL (ref 13.0–17.0)
MCH: 27.2 pg (ref 26.0–34.0)
MCHC: 31.1 g/dL (ref 30.0–36.0)
MCV: 87.4 fL (ref 78.0–100.0)
PLATELETS: 750 10*3/uL — AB (ref 150–400)
RBC: 5.48 MIL/uL (ref 4.22–5.81)
RDW: 19.2 % — AB (ref 11.5–15.5)
WBC: 20.1 10*3/uL — AB (ref 4.0–10.5)

## 2013-08-12 LAB — BASIC METABOLIC PANEL
BUN: 36 mg/dL — ABNORMAL HIGH (ref 6–23)
CHLORIDE: 102 meq/L (ref 96–112)
CO2: 29 mEq/L (ref 19–32)
Calcium: 9.1 mg/dL (ref 8.4–10.5)
Creatinine, Ser: 1.1 mg/dL (ref 0.50–1.35)
GFR calc non Af Amer: 70 mL/min — ABNORMAL LOW (ref >90)
GFR, EST AFRICAN AMERICAN: 81 mL/min — AB (ref >90)
Glucose, Bld: 113 mg/dL — ABNORMAL HIGH (ref 70–99)
Potassium: 4.5 mEq/L (ref 3.7–5.3)
SODIUM: 141 meq/L (ref 137–147)

## 2013-08-12 MED ORDER — CARVEDILOL 6.25 MG PO TABS
6.2500 mg | ORAL_TABLET | Freq: Two times a day (BID) | ORAL | Status: DC
  Administered 2013-08-12 – 2013-08-14 (×4): 6.25 mg via ORAL
  Filled 2013-08-12 (×7): qty 1

## 2013-08-12 NOTE — Progress Notes (Signed)
[facility] PROGRESS NOTE  Fernando BUELOW UVO:[id number] DOB: 05-16-1950 DOA: 08/07/2013 PCP: Gwendolyn Grant, MD  Assessment/Plan: 1. STEMI 1. Cardiology following 2. Pt now s/p heart cath revealing severe 2 vessel occlusive dz involving mid-dist LAD and RCA with LVEF of 20%. Recs for med management w/ ASA, statin 3. Heparin was stopped. Plavix added to ASA 5/30 4. No LV thrombus on echo but RV infarction demonstrated 5. Not a PCI or CABG candidate 2. RLE ischemia 1. Vascular surgery was initially consulted 2. On ASA 3. S/p aortogram 5/27 with arterial flow noted - no further surgical recs and vascular surgery has since signed off 5/28 3. HTN 1. BP currently stable, controlled 4. HLD 1. On statin 5. Polycythemia 1. Hgb remains w/in normal limits  6. Acute systolic CHF 1. On lasix, changed to PO 5/30 secondary to rising Cr 2. Spironolactone added, increased to 25mg , beta blocker on hold 3. Net pos 1.18L per flow sheet since admit 4. Wt of 101.2kg ->100.2kg 7. Large pericardial effusion secondary to pericarditis 1. Decompensated 5/29, prompting emergent pericardiocentesis 2. Clinically improved since procedure 3. Now on colchicine  8. DVT prophylaxis 1. Heparin gtt stopped, will cont with subq prophylactic heparin 9. Cough 1. Mildly elevated WBC 2. CXR with findings suggestive of pulm edema 3. Tessalon perles for now 10. Stasis Ulcers 1. On exam, good granulation with no surrounding erythema and no active drainae 2. Monitor for now 3. Cont dressing changes  Code Status: Full Family Communication: Pt in room Disposition Plan: Pending   Consultants:  Vascular surgery  Cardiology  Procedures:  Heart cath 5/27  Aortogram 5/27  Emergent Pericardiocentesis 5/29  Antibiotics: none  HPI/Subjective: Reports feeling more sluggish this AM. No other complaints. Denies CP or SOB  Objective: Filed Vitals:   08/12/13 0400 08/12/13 0457 08/12/13 0500  08/12/13 0600  BP: 125/67  135/65 154/73  Pulse: 92  95 94  Temp: 98 F (36.7 C)     TempSrc: Axillary     Resp: 17  19 12   Height:      Weight:  100.2 kg (220 lb 14.4 oz)    SpO2: 93%  95% 93%    Intake/Output Summary (Last 24 hours) at 08/12/13 0746 Last data filed at 08/12/13 0600  Gross per 24 hour  Intake   1086 ml  Output    620 ml  Net    466 ml   Filed Weights   08/10/13 0500 08/11/13 0452 08/12/13 0457  Weight: 100.1 kg (220 lb 10.9 oz) 101.2 kg (223 lb 1.7 oz) 100.2 kg (220 lb 14.4 oz)    Exam:   General:  Awake, in nad  Cardiovascular: regular, s1, s2  Respiratory: normal resp effort, no wheezing  Abdomen: soft, obese, pos bs  Musculoskeletal: palpable DP on R, L foot cold to touch   Data Reviewed: Basic Metabolic Panel:  Recent Labs Lab 08/08/13 0258 08/09/13 0246 08/10/13 0402 08/11/13 0242 08/12/13 0247  NA 139 138 140 139 141  K 4.0 4.0 3.5* 4.7 4.5  CL 102 99 100 103 102  CO2 21 28 26 22 29   GLUCOSE 101* 96 107* 117* 113*  BUN 21 23 26* 38* 36*  CREATININE 0.93 1.07 0.92 1.32 1.10  CALCIUM 8.9 8.8 9.0 8.5 9.1   Liver Function Tests:  Recent Labs Lab 08/07/13 1600  AST 20  ALT 14  ALKPHOS 186*  BILITOT 1.4*  PROT 6.8  ALBUMIN 3.1*   No results found for this basename:  LIPASE, AMYLASE,  in the last 168 hours No results found for this basename: AMMONIA,  in the last 168 hours CBC:  Recent Labs Lab 08/07/13 1600  08/09/13 0246 08/10/13 0402 08/10/13 1325 08/11/13 0242 08/12/13 0247  WBC 16.2*  < > 17.1* 19.9* 20.5* 15.9* 20.1*  NEUTROABS 14.7*  --   --   --   --   --   --   HGB 15.3  < > 14.5 14.8 14.6 14.5 14.9  HCT 48.2  < > 45.5 46.2 45.8 44.9 47.9  MCV 83.5  < > 84.9 84.8 85.3 84.6 87.4  PLT 410*  < > 499* 557* 687* 569* 750*  < > = values in this interval not displayed. Cardiac Enzymes:  Recent Labs Lab 08/07/13 1600 08/08/13 0258 08/08/13 0914 08/08/13 2000  CKTOTAL 63  --   --   --   TROPONINI 1.90*  2.97* 2.21* 1.93*   BNP (last 3 results)  Recent Labs  08/07/13 1600  PROBNP 13375.0*   CBG:  Recent Labs Lab 08/08/13 1947  GLUCAP 152*    Recent Results (from the past 240 hour(s))  CULTURE, BLOOD (ROUTINE X 2)     Status: None   Collection Time    08/07/13  5:10 PM      Result Value Ref Range Status   Specimen Description BLOOD ARM LEFT   Final   Special Requests BOTTLES DRAWN AEROBIC AND ANAEROBIC 5CC   Final   Culture  Setup Time     Final   Value: 08/07/2013 23:11     Performed at [facility]   Culture     Final   Value:        BLOOD CULTURE RECEIVED NO GROWTH TO DATE CULTURE WILL BE HELD FOR 5 DAYS BEFORE ISSUING A FINAL NEGATIVE REPORT     Performed at [facility]   Report Status PENDING   Incomplete  CULTURE, BLOOD (ROUTINE X 2)     Status: None   Collection Time    08/07/13  5:16 PM      Result Value Ref Range Status   Specimen Description BLOOD HAND LEFT   Final   Special Requests BOTTLES DRAWN AEROBIC ONLY 5CC   Final   Culture  Setup Time     Final   Value: 08/07/2013 23:11     Performed at [facility]   Culture     Final   Value:        BLOOD CULTURE RECEIVED NO GROWTH TO DATE CULTURE WILL BE HELD FOR 5 DAYS BEFORE ISSUING A FINAL NEGATIVE REPORT     Performed at [facility]   Report Status PENDING   Incomplete  URINE CULTURE     Status: None   Collection Time    08/07/13  8:24 PM      Result Value Ref Range Status   Specimen Description URINE, CLEAN CATCH   Final   Special Requests NONE   Final   Culture  Setup Time     Final   Value: 08/07/2013 21:37     Performed at [place]     Final   Value: 3,000 COLONIES/ML     Performed at [facility]   Culture     Final   Value: INSIGNIFICANT GROWTH     Performed at [facility]   Report Status 08/08/2013 FINAL   Final  MRSA PCR SCREENING  Status: Abnormal   Collection Time    08/08/13  1:59 AM      Result Value Ref  Range Status   MRSA by PCR POSITIVE (*) NEGATIVE Final   Comment:            The GeneXpert MRSA Assay (FDA     approved for NASAL specimens     only), is one component of a     comprehensive MRSA colonization     surveillance program. It is not     intended to diagnose MRSA     infection nor to guide or     monitor treatment for     MRSA infections.     RESULT CALLED TO, READ BACK BY AND VERIFIED WITH:     H.RICHARD RN [phone] 08/08/13 E.GADDY  BODY FLUID CULTURE     Status: None   Collection Time    08/10/13  3:30 PM      Result Value Ref Range Status   Specimen Description FLUID PERICARDIAL   Final   Special Requests NONE   Final   Gram Stain     Final   Value: NO WBC SEEN     NO ORGANISMS SEEN     Performed at [facility]   Culture     Final   Value: NO GROWTH     Performed at [facility]   Report Status PENDING   Incomplete     Studies: Dg Chest Port 1 View  08/10/2013   CLINICAL DATA:  Cough.  EXAM: PORTABLE CHEST - 1 VIEW  COMPARISON:  Chest radiograph Aug 07, 2013  FINDINGS: The cardiac silhouette remains severely enlarged, with somewhat widened mediastinal which may be attributable to AP lordotic technique, unchanged. Similar central pulmonary vasculature congestion and interstitial prominence in this low inspiratory portable examination. Slight blunting of the costophrenic angles. Strandy densities left lung base. No pneumothorax.  Partially imaged ACDF. Severe acromioclavicular osteoarthrosis. Mild degenerative change of thoracic spine with dextroscoliosis. Multiple EKG lines overlie the patient and may obscure subtle underlying pathology.  IMPRESSION: Stable cardiomegaly, mild interstitial prominence may reflect pulmonary edema with left lung base atelectasis and trace bilateral costophrenic angle pleural thickening versus pleural effusions.   Electronically Signed   By: Elon Alas   On: 08/10/2013 12:40    Scheduled Meds: . antiseptic oral rinse   15 mL Mouth Rinse q12n4p  . aspirin  325 mg Oral Daily  . atorvastatin  80 mg Oral q1800  . carvedilol  6.25 mg Oral BID WC  . chlorhexidine  15 mL Mouth Rinse BID  . clopidogrel  75 mg Oral Q breakfast  . colchicine  0.6 mg Oral BID  . collagenase   Topical Daily  . feeding supplement (ENSURE COMPLETE)  237 mL Oral BID BM  . folic acid  1 mg Oral Daily  . furosemide  40 mg Oral Daily  . gabapentin  300 mg Oral TID  . heparin subcutaneous  5,000 Units Subcutaneous 3 times per day  . lisinopril  5 mg Oral Daily  . LORazepam  0-4 mg Oral Q12H  . multivitamin with minerals  1 tablet Oral Daily  . mupirocin ointment  1 application Nasal BID  . sodium chloride  3 mL Intravenous Q12H  . spironolactone  12.5 mg Oral Daily  . thiamine  100 mg Oral Daily   Or  . thiamine  100 mg Intravenous Daily   Continuous Infusions:    Principal Problem:  Ischemia of extremity right leg Active Problems:   HYPERLIPIDEMIA-MIXED   POLYCYTHEMIA   HYPERTENSION   PERIPHERAL VASCULAR DISEASE   Leg ulcer   ST elevation myocardial infarction (STEMI) in recovery phase   Coronary atherosclerosis of native coronary artery   Acute systolic heart failure   CAD (coronary artery disease)   Time spent: 80min  Ahlaya Ende K Avril Busser  [facility] Pager [phone]. If 7PM-7AM, please contact night-coverage at [email or web address], password [facility] 08/12/2013, 7:45 AM  LOS: 5 days

## 2013-08-12 NOTE — Progress Notes (Signed)
Pt received transfer orders - updated of pending move to another room - pt has no questions or concerns at this time.  Family called and notified of transfer to 2H22  Report given - pt transferred via his bed with his belongings - no increase in pain or discomfort or acute issues upon transfer.

## 2013-08-12 NOTE — Progress Notes (Signed)
Respiratory therapy note- Patient placed on CPAP for the evening with 3lmin bleed in of oxygen.
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
## 2013-08-13 LAB — CULTURE, BLOOD (ROUTINE X 2)
CULTURE: NO GROWTH
Culture: NO GROWTH

## 2013-08-13 LAB — CBC
HCT: 50 % (ref 39.0–52.0)
HEMOGLOBIN: 15.2 g/dL (ref 13.0–17.0)
MCH: 26.8 pg (ref 26.0–34.0)
MCHC: 30.4 g/dL (ref 30.0–36.0)
MCV: 88.2 fL (ref 78.0–100.0)
Platelets: 839 10*3/uL — ABNORMAL HIGH (ref 150–400)
RBC: 5.67 MIL/uL (ref 4.22–5.81)
RDW: 19.2 % — ABNORMAL HIGH (ref 11.5–15.5)
WBC: 17.8 10*3/uL — AB (ref 4.0–10.5)

## 2013-08-13 LAB — BASIC METABOLIC PANEL
BUN: 34 mg/dL — AB (ref 6–23)
CHLORIDE: 103 meq/L (ref 96–112)
CO2: 26 mEq/L (ref 19–32)
Calcium: 9.1 mg/dL (ref 8.4–10.5)
Creatinine, Ser: 0.93 mg/dL (ref 0.50–1.35)
GFR calc Af Amer: >90 mL/min (ref >90)
GFR, EST NON AFRICAN AMERICAN: 88 mL/min — AB (ref >90)
GLUCOSE: 95 mg/dL (ref 70–99)
POTASSIUM: 4.9 meq/L (ref 3.7–5.3)
SODIUM: 141 meq/L (ref 137–147)

## 2013-08-13 MED ORDER — FUROSEMIDE 40 MG PO TABS
40.0000 mg | ORAL_TABLET | Freq: Every day | ORAL | Status: DC
  Administered 2013-08-14: 40 mg via ORAL
  Filled 2013-08-13 (×2): qty 1

## 2013-08-13 MED ORDER — FUROSEMIDE 10 MG/ML IJ SOLN
20.0000 mg | Freq: Two times a day (BID) | INTRAMUSCULAR | Status: AC
  Administered 2013-08-13 (×2): 20 mg via INTRAVENOUS
  Filled 2013-08-13 (×2): qty 2

## 2013-08-13 NOTE — Progress Notes (Addendum)
Pt selected [facility] for short-term SNF when ready for discharge. CSW following case.  Addendum: Pt transferred to 2W. Unit CSW provided a handoff. This CSW signing off.  Ky Barban, MSW, [place] [facility]

## 2013-08-13 NOTE — Progress Notes (Signed)
Physical Therapy Treatment Patient Details Name: Fernando Lane MRN: [id number] DOB: 01/18/1951 Today's Date: 08/13/2013    History of Present Illness 63 yo male with Rt ichemic Limb and Nstemi    PT Comments    Pt progressing towards physical therapy goals. Motivated to transfer OOB today, however once transfer initiated, pt rushing to get over to the recliner to sit down. Pt requires frequent cueing for safety awareness, hand placement on the walker, and improved posture. He reports continued pain while weight bearing on the RLE and states it continues to feel like he's stepping on marbles. Will continue to progress per POC.   Follow Up Recommendations  CIR;Supervision for mobility/OOB     Equipment Recommendations  Rolling walker with 5" wheels;3in1 (PT)    Recommendations for Other Services Rehab consult     Precautions / Restrictions Precautions Precautions: Fall Precaution Comments: rt foot x2 ulcers Restrictions Weight Bearing Restrictions: No    Mobility  Bed Mobility Overal bed mobility: Needs Assistance Bed Mobility: Supine to Sit     Supine to sit: Min assist;Mod assist     General bed mobility comments: VC's for sequencing and technique. Pt able to use bed rails for some assist, however bed pad use required to scoot hips to EOB. Mod assist for trunk elevation to full sitting position.   Transfers Overall transfer level: Needs assistance Equipment used: Rolling walker (2 wheeled) Transfers: Sit to/from [facility] Sit to Stand: Mod assist Stand pivot transfers: Mod assist       General transfer comment: VC's for hand placement on seated surface for safety. Pt was able to pivot around from bed to recliner with max cueing for hands on the walker and improved posture. Pt almost dragging RLE along with very flexed posture. Pt demonstrating a very uncontrolled descent to the chair.   Ambulation/Gait                 Stairs             Wheelchair Mobility    Modified Rankin (Stroke Patients Only)       Balance Overall balance assessment: Needs assistance Sitting-balance support: Feet supported;No upper extremity supported Sitting balance-Leahy Scale: Fair     Standing balance support: Bilateral upper extremity supported Standing balance-Leahy Scale: Poor Standing balance comment: Requires UE support on the RW for balance.                     Cognition Arousal/Alertness: Awake/alert Behavior During Therapy: WFL for tasks assessed/performed Overall Cognitive Status: Within Functional Limits for tasks assessed                      Exercises      General Comments General comments (skin integrity, edema, etc.): Pt education for LE strengthening HEP. Pt demonstrated a few reps of LAQ, hip abd/add, and quad sets.       Pertinent Vitals/Pain Vitals stable throughout session.     Home Living                      Prior Function            PT Goals (current goals can now be found in the care plan section) Acute Rehab PT Goals Patient Stated Goal: to return home to his cat PT Goal Formulation: With patient Time For Goal Achievement: 08/23/13 Potential to Achieve Goals: Good Progress towards PT goals: Progressing toward goals  Frequency  Min 3X/week    PT Plan Current plan remains appropriate    Co-evaluation             End of Session Equipment Utilized During Treatment: Gait belt Activity Tolerance: Patient limited by pain Patient left: in chair;with chair alarm set;with call bell/phone within reach     Time: 1347-1426 PT Time Calculation (min): 39 min  Charges:  $Gait Training: 8-22 mins $Therapeutic Exercise: 8-22 mins $Therapeutic Activity: 8-22 mins                    G Codes:      Jolyn Lent 09/06/13, 3:53 PM  Jolyn Lent, PT, DPT Acute Rehabilitation Services Pager: [phone]

## 2013-08-13 NOTE — Progress Notes (Signed)
[facility] PROGRESS NOTE  Fernando Lane WFU:[id number] DOB: [birth year] DOA: 08/07/2013 PCP: Gwendolyn Grant, MD  Assessment/Plan: 1. STEMI 1. Cardiology following 2. Pt now s/p heart cath revealing severe 2 vessel occlusive dz involving mid-dist LAD and RCA with LVEF of 20%. Recs for med management w/ ASA, statin 3. Heparin was stopped. Plavix added to ASA 5/30 4. No LV thrombus on echo but RV infarction demonstrated 5. Not a PCI or CABG candidate 2. RLE ischemia 1. Vascular surgery was initially consulted 2. On ASA 3. S/p aortogram 5/27 with arterial flow noted - no further surgical recs and vascular surgery has since signed off 5/28 3. HTN 1. BP currently stable, controlled 4. HLD 1. On statin 5. Polycythemia 1. Hgb remains w/in normal limits  6. Acute systolic CHF 1. On lasix, changed to PO 5/30 secondary to rising Cr 2. Spironolactone added, increased to 25mg , cont beta blocker 3. Net pos 1.5L per flow sheet since admit 4. Wt of 100.2kg->102kg 7. Large pericardial effusion secondary to pericarditis 1. Decompensated 5/29, prompting emergent pericardiocentesis 2. Clinically much improved since procedure 3. Now on colchicine  8. DVT prophylaxis 1. Heparin gtt stopped, will cont with subq prophylactic heparin 9. Cough 1. Mildly elevated WBC 2. CXR with findings suggestive of pulm edema 3. Diuresis per Cardiology 4. Tessalon perles for now 10. Stasis Ulcers 1. On exam, good granulation with no surrounding erythema and no active drainae 2. Monitor for now 3. Cont dressing changes 11. Leukocytosis 1. Unclear etiology 2. Afebrile 3. Recent UA and CXR unremarkable 4. Recent pan cultures neg 5. ? Secondary to pericarditis 6. Monitor for now  Code Status: Full Family Communication: Pt in room Disposition Plan: Pending - Transfer to floor today   Consultants:  Vascular surgery  Cardiology  Procedures:  Heart cath 5/27  Aortogram 5/27  Emergent  Pericardiocentesis 5/29  Antibiotics: none  HPI/Subjective: No complaints. No acute events noted overnight  Objective: Filed Vitals:   08/13/13 0400 08/13/13 0455 08/13/13 0500 08/13/13 0700  BP: 117/69  109/68 112/73  Pulse: 91  95 97  Temp:  98 F (36.7 C)  98.2 F (36.8 C)  TempSrc:  Oral  Oral  Resp: 21  22 22   Height:      Weight:  102 kg (224 lb 13.9 oz)    SpO2: 92%  96% 95%    Intake/Output Summary (Last 24 hours) at 08/13/13 0807 Last data filed at 08/13/13 0100  Gross per 24 hour  Intake    960 ml  Output    600 ml  Net    360 ml   Filed Weights   08/11/13 0452 08/12/13 0457 08/13/13 0455  Weight: 101.2 kg (223 lb 1.7 oz) 100.2 kg (220 lb 14.4 oz) 102 kg (224 lb 13.9 oz)    Exam:   General:  Awake, in nad  Cardiovascular: regular, s1, s2  Respiratory: normal resp effort, no wheezing  Abdomen: soft, obese, pos bs  Musculoskeletal: palpable DP on R, L foot cold to touch   Data Reviewed: Basic Metabolic Panel:  Recent Labs Lab 08/09/13 0246 08/10/13 0402 08/11/13 0242 08/12/13 0247 08/13/13 0314  NA 138 140 139 141 141  K 4.0 3.5* 4.7 4.5 4.9  CL 99 100 103 102 103  CO2 28 26 22 29 26   GLUCOSE 96 107* 117* 113* 95  BUN 23 26* 38* 36* 34*  CREATININE 1.07 0.92 1.32 1.10 0.93  CALCIUM 8.8 9.0 8.5 9.1 9.1   Liver Function  Tests:  Recent Labs Lab 08/07/13 1600  AST 20  ALT 14  ALKPHOS 186*  BILITOT 1.4*  PROT 6.8  ALBUMIN 3.1*   No results found for this basename: LIPASE, AMYLASE,  in the last 168 hours No results found for this basename: AMMONIA,  in the last 168 hours CBC:  Recent Labs Lab 08/07/13 1600  08/10/13 0402 08/10/13 1325 08/11/13 0242 08/12/13 0247 08/13/13 0314  WBC 16.2*  < > 19.9* 20.5* 15.9* 20.1* 17.8*  NEUTROABS 14.7*  --   --   --   --   --   --   HGB 15.3  < > 14.8 14.6 14.5 14.9 15.2  HCT 48.2  < > 46.2 45.8 44.9 47.9 50.0  MCV 83.5  < > 84.8 85.3 84.6 87.4 88.2  PLT 410*  < > 557* 687* 569* 750*  839*  < > = values in this interval not displayed. Cardiac Enzymes:  Recent Labs Lab 08/07/13 1600 08/08/13 0258 08/08/13 0914 08/08/13 2000  CKTOTAL 63  --   --   --   TROPONINI 1.90* 2.97* 2.21* 1.93*   BNP (last 3 results)  Recent Labs  08/07/13 1600  PROBNP 13375.0*   CBG:  Recent Labs Lab 08/08/13 1947  GLUCAP 152*    Recent Results (from the past 240 hour(s))  CULTURE, BLOOD (ROUTINE X 2)     Status: None   Collection Time    08/07/13  5:10 PM      Result Value Ref Range Status   Specimen Description BLOOD ARM LEFT   Final   Special Requests BOTTLES DRAWN AEROBIC AND ANAEROBIC 5CC   Final   Culture  Setup Time     Final   Value: 08/07/2013 23:11     Performed at [facility]   Culture     Final   Value: NO GROWTH 5 DAYS     Performed at [facility]   Report Status 08/13/2013 FINAL   Final  CULTURE, BLOOD (ROUTINE X 2)     Status: None   Collection Time    08/07/13  5:16 PM      Result Value Ref Range Status   Specimen Description BLOOD HAND LEFT   Final   Special Requests BOTTLES DRAWN AEROBIC ONLY 5CC   Final   Culture  Setup Time     Final   Value: 08/07/2013 23:11     Performed at [facility]   Culture     Final   Value: NO GROWTH 5 DAYS     Performed at [facility]   Report Status 08/13/2013 FINAL   Final  URINE CULTURE     Status: None   Collection Time    08/07/13  8:24 PM      Result Value Ref Range Status   Specimen Description URINE, CLEAN CATCH   Final   Special Requests NONE   Final   Culture  Setup Time     Final   Value: 08/07/2013 21:37     Performed at [facility]     Final   Value: 3,000 COLONIES/ML     Performed at [facility]   Culture     Final   Value: INSIGNIFICANT GROWTH     Performed at [facility]   Report Status 08/08/2013 FINAL   Final  MRSA PCR SCREENING     Status: Abnormal   Collection Time    08/08/13  1:59 AM      Result Value Ref  Range Status   MRSA by PCR POSITIVE (*) NEGATIVE Final   Comment:            The GeneXpert MRSA Assay (FDA     approved for NASAL specimens     only), is one component of a     comprehensive MRSA colonization     surveillance program. It is not     intended to diagnose MRSA     infection nor to guide or     monitor treatment for     MRSA infections.     RESULT CALLED TO, READ BACK BY AND VERIFIED WITH:     H.RICHARD RN [phone] 08/08/13 E.GADDY  BODY FLUID CULTURE     Status: None   Collection Time    08/10/13  3:30 PM      Result Value Ref Range Status   Specimen Description FLUID PERICARDIAL   Final   Special Requests NONE   Final   Gram Stain     Final   Value: NO WBC SEEN     NO ORGANISMS SEEN     Performed at [facility]   Culture     Final   Value: NO GROWTH 2 DAYS     Performed at [facility]   Report Status PENDING   Incomplete     Studies: No results found.  Scheduled Meds: . antiseptic oral rinse  15 mL Mouth Rinse q12n4p  . aspirin  325 mg Oral Daily  . atorvastatin  80 mg Oral q1800  . carvedilol  6.25 mg Oral BID WC  . chlorhexidine  15 mL Mouth Rinse BID  . clopidogrel  75 mg Oral Q breakfast  . colchicine  0.6 mg Oral BID  . collagenase   Topical Daily  . feeding supplement (ENSURE COMPLETE)  237 mL Oral BID BM  . folic acid  1 mg Oral Daily  . furosemide  20 mg Intravenous BID  . [START ON 08/14/2013] furosemide  40 mg Oral Daily  . gabapentin  300 mg Oral TID  . heparin subcutaneous  5,000 Units Subcutaneous 3 times per day  . lisinopril  5 mg Oral Daily  . LORazepam  0-4 mg Oral Q12H  . multivitamin with minerals  1 tablet Oral Daily  . mupirocin ointment  1 application Nasal BID  . sodium chloride  3 mL Intravenous Q12H  . spironolactone  12.5 mg Oral Daily  . thiamine  100 mg Oral Daily   Or  . thiamine  100 mg Intravenous Daily   Continuous Infusions:    Principal Problem:   Ischemia of extremity right leg Active  Problems:   HYPERLIPIDEMIA-MIXED   POLYCYTHEMIA   HYPERTENSION   PERIPHERAL VASCULAR DISEASE   Leg ulcer   ST elevation myocardial infarction (STEMI) in recovery phase   Coronary atherosclerosis of native coronary artery   Acute systolic heart failure   CAD (coronary artery disease)   Time spent: 64min  Abeer Deskins K Kelley Polinsky  [facility] Pager [phone]. If 7PM-7AM, please contact night-coverage at [email or web address], password [facility] 08/13/2013, 8:07 AM  LOS: 6 days
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
## 2013-08-13 NOTE — Progress Notes (Signed)
Pt. Was placed on CPAP of 10cm H2O via FFM (what pt. Wears at home) Pt. Is tolerating CPAP well at this time without any complications.

## 2013-08-14 LAB — CBC
HEMATOCRIT: 49.9 % (ref 39.0–52.0)
HEMOGLOBIN: 16.1 g/dL (ref 13.0–17.0)
MCH: 28 pg (ref 26.0–34.0)
MCHC: 32.3 g/dL (ref 30.0–36.0)
MCV: 86.9 fL (ref 78.0–100.0)
Platelets: 776 10*3/uL — ABNORMAL HIGH (ref 150–400)
RBC: 5.74 MIL/uL (ref 4.22–5.81)
RDW: 19.1 % — ABNORMAL HIGH (ref 11.5–15.5)
WBC: 19.8 10*3/uL — ABNORMAL HIGH (ref 4.0–10.5)

## 2013-08-14 LAB — BASIC METABOLIC PANEL
BUN: 26 mg/dL — AB (ref 6–23)
CALCIUM: 9.3 mg/dL (ref 8.4–10.5)
CO2: 31 meq/L (ref 19–32)
Chloride: 99 mEq/L (ref 96–112)
Creatinine, Ser: 0.85 mg/dL (ref 0.50–1.35)
GFR calc Af Amer: >90 mL/min (ref >90)
GLUCOSE: 145 mg/dL — AB (ref 70–99)
POTASSIUM: 4.8 meq/L (ref 3.7–5.3)
SODIUM: 139 meq/L (ref 137–147)

## 2013-08-14 LAB — BODY FLUID CULTURE
Culture: NO GROWTH
Gram Stain: NONE SEEN

## 2013-08-14 MED ORDER — CARVEDILOL 6.25 MG PO TABS
9.3750 mg | ORAL_TABLET | Freq: Two times a day (BID) | ORAL | Status: DC
  Administered 2013-08-14 – 2013-08-16 (×5): 9.375 mg via ORAL
  Filled 2013-08-14 (×8): qty 1

## 2013-08-14 NOTE — Progress Notes (Signed)
OT NOTE  Pt expresses extreme discomfort static standing in hospital socks. Question the need for post op shoe vs darco shoe for RT LE? Pt could benefit from extra support to RT LE with attempts to progress with therapy. No personal shoes present in room at this time.   Jeri Modena   OTR/L Pager: [phone] Office: [phone] .

## 2013-08-14 NOTE — Progress Notes (Signed)
Occupational Therapy Treatment Patient Details Name: Fernando Lane MRN: [id number] DOB: July 08, 1950 Today's Date: 08/14/2013    History of present illness 63 yo male with Rt ichemic Limb and Nstemi   OT comments  Pt could benefit from SNF placement due to decr activity tolerance and decr mobility. Question need for darco shoe for Rt LE. Pt reports extreme pain only with static standing pressure.   Follow Up Recommendations  SNF    Equipment Recommendations  Other (comment)    Recommendations for Other Services      Precautions / Restrictions Precautions Precautions: Fall Precaution Comments: rt foot x2 ulcers       Mobility Bed Mobility               General bed mobility comments: in chair on arrival. Chair alarm not set. Ot set chair alarm at end fo session  Transfers Overall transfer level: Needs assistance Equipment used: 2 person hand held assist Transfers: Sit to/from Stand Sit to Stand: +2 physical assistance;Mod assist         General transfer comment: pt with vc for upright posture    Balance Overall balance assessment: Needs assistance         Standing balance support: Bilateral upper extremity supported;During functional activity Standing balance-Leahy Scale: Zero Standing balance comment: Pt with strong posterior lean                   ADL Overall ADL's : Needs assistance/impaired     Grooming: Oral care;Minimal assistance;Sitting Grooming Details (indicate cue type and reason): pt unable to static stand for oral care. Pt required seated position                               General ADL Comments: Pt verbalizes pain in Rt foot feels like "marbles under foot" pt noted to have discolor at toes and heel base of the foot. Question need for darco shoe to help with pressure with transfers.      Vision                     Perception     Praxis      Cognition   Behavior During Therapy: WFL for tasks  assessed/performed Overall Cognitive Status: Impaired/Different from baseline Area of Impairment: Safety/judgement     Memory: Decreased short-term memory    Safety/Judgement: Decreased awareness of deficits     General Comments: pt likes to joke and needs redirection to task. pt with chair alarm sounding after session due to attempts to complete transfer without (A)    Extremity/Trunk Assessment               Exercises     Shoulder Instructions       General Comments      Pertinent Vitals/ Pain       Pain Rt LE with static standing  Home Living                                          Prior Functioning/Environment              Frequency Min 2X/week     Progress Toward Goals  OT Goals(current goals can now be found in the care plan section)  Progress towards OT goals: Progressing toward goals  Acute Rehab  OT Goals Patient Stated Goal: to return home to his cat OT Goal Formulation: With patient Time For Goal Achievement: 08/24/13 Potential to Achieve Goals: Good ADL Goals Pt Will Perform Grooming: with min guard assist;sitting Pt Will Perform Upper Body Bathing: with min guard assist;sitting Pt Will Transfer to Toilet: with mod assist;bedside commode;ambulating  Plan Discharge plan remains appropriate    Co-evaluation                 End of Session     Activity Tolerance Patient tolerated treatment well   Patient Left in chair;with call bell/phone within reach;with chair alarm set   Nurse Communication Mobility status;Precautions        Time: 2979-8921 OT Time Calculation (min): 20 min  Charges: OT General Charges $OT Visit: 1 Procedure OT Treatments $Self Care/Home Management : 8-22 mins  Peri Maris 08/14/2013, 4:02 PM Pager: [phone]

## 2013-08-14 NOTE — Progress Notes (Signed)
Discussed MI, HF, low sodium and daily wts with pt. Voiced understanding and plans to follow instructions. Gave HF booklet and MI book. Gave sodium counting sheets. All CR goals met as pt is nonambulatory. Will sign off. 1030-1058 Kristan Reeve CES, ACSM 10:58 AM 08/14/2013  
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
## 2013-08-14 NOTE — Clinical Social Work Note (Signed)
CSW following for DC to [facility] and Rehab once patient is medically stable.   Liz Beach MSW, [place], [place], [phone]

## 2013-08-14 NOTE — Progress Notes (Addendum)
[facility] PROGRESS NOTE  Fernando Lane KWI:[id number] DOB: [birth year] DOA: 08/07/2013 PCP: Gwendolyn Grant, MD  Off service summary: [phone] who initially presented to with suspicion for arterial occlusion of the RLE. Vascular surgery was consulted. Shortly after admission, the patient was found to have NSTEMI and cardiology was consulted as well. The patient had a heart cath demonstrating multiple vessel disease, not PCI or CABG candidate. Aortogram confirmed blood flow in the RLE and no further vascular intervention recommended. The patient later was found to have a large pericardial effusion on echo thought to be secondary to pericarditis following NSTEMI. On 5/29, the patient became clinically unstable and required emergent pericardiocentesis. Pt has since remained stable with tentative plans for d/c to SNF in the next 1-2 days if he remains stable.  Assessment/Plan: 1. STEMI 1. Cardiology following 2. Pt now s/p heart cath revealing severe 2 vessel occlusive dz involving mid-dist LAD and RCA with LVEF of 20%. Recs for med management w/ ASA, statin 3. Heparin was stopped. Plavix added to ASA 5/30 4. No LV thrombus on echo but RV infarction demonstrated 5. Not a PCI or CABG candidate 2. RLE ischemia 1. Vascular surgery was initially consulted 2. On ASA 3. S/p aortogram 5/27 with arterial flow noted - no further surgical recs and vascular surgery has since signed off 5/28 3. HTN 1. BP currently stable, controlled 4. HLD 1. On statin 5. Polycythemia 1. Hgb remains w/in normal limits  6. Acute systolic CHF 1. On lasix, changed to PO 5/30 secondary to rising Cr 2. Spironolactone added, increased to 25mg , cont beta blocker 3. Net pos 1.1L per flow sheet since admit 4. Wt of 102kg->100.60kg overnight 7. Large pericardial effusion secondary to pericarditis 1. Clinically decompensated 5/29, prompting emergent pericardiocentesis 2. Clinically much improved since procedure 3. Now on  colchicine  8. DVT prophylaxis 1. Heparin gtt stopped, will cont with subq prophylactic heparin 9. Cough 1. Mildly elevated WBC 2. CXR with findings suggestive of pulm edema 3. Diuresis per Cardiology 4. Tessalon perles for now 10. Stasis Ulcers 1. On exam, good granulation with no surrounding erythema and no active drainae 2. Monitor for now 3. Cont dressing changes 11. Leukocytosis 1. Unclear etiology 2. Afebrile 3. Recent UA and CXR unremarkable 4. Recent pan cultures neg 5. ? Secondary to above pericarditis 6. Monitor for now  Code Status: Full Family Communication: Pt in room Disposition Plan: Pending - [facility] for short-term SNF when medically ready   Consultants:  Vascular surgery  Cardiology  Procedures:  Heart cath 5/27  Aortogram 5/27  Emergent Pericardiocentesis 5/29  Antibiotics: none  HPI/Subjective: Reported mild indigestion earlier this AM. Otherwise, no other events noted overnight  Objective: Filed Vitals:   08/13/13 1957 08/13/13 2145 08/13/13 2153 08/14/13 0547  BP:   106/73 121/85  Pulse:  93 93 95  Temp:   98.3 F (36.8 C) 97.9 F (36.6 C)  TempSrc:   Axillary Axillary  Resp:  20 20 20   Height:      Weight:    100.064 kg (220 lb 9.6 oz)  SpO2: 95% 95% 95% 95%    Intake/Output Summary (Last 24 hours) at 08/14/13 0801 Last data filed at 08/13/13 1948  Gross per 24 hour  Intake    120 ml  Output    500 ml  Net   -380 ml   Filed Weights   08/12/13 0457 08/13/13 0455 08/14/13 0547  Weight: 100.2 kg (220 lb 14.4 oz) 102 kg (  224 lb 13.9 oz) 100.064 kg (220 lb 9.6 oz)    Exam:   General:  Awake, in nad  Cardiovascular: regular, s1, s2  Respiratory: normal resp effort, no wheezing  Abdomen: soft, obese, pos bs  Musculoskeletal: palpable DP on R, L foot cold to touch   Data Reviewed: Basic Metabolic Panel:  Recent Labs Lab 08/09/13 0246 08/10/13 0402 08/11/13 0242 08/12/13 0247 08/13/13 0314   NA 138 140 139 141 141  K 4.0 3.5* 4.7 4.5 4.9  CL 99 100 103 102 103  CO2 28 26 22 29 26   GLUCOSE 96 107* 117* 113* 95  BUN 23 26* 38* 36* 34*  CREATININE 1.07 0.92 1.32 1.10 0.93  CALCIUM 8.8 9.0 8.5 9.1 9.1   Liver Function Tests:  Recent Labs Lab 08/07/13 1600  AST 20  ALT 14  ALKPHOS 186*  BILITOT 1.4*  PROT 6.8  ALBUMIN 3.1*   No results found for this basename: LIPASE, AMYLASE,  in the last 168 hours No results found for this basename: AMMONIA,  in the last 168 hours CBC:  Recent Labs Lab 08/07/13 1600  08/10/13 0402 08/10/13 1325 08/11/13 0242 08/12/13 0247 08/13/13 0314  WBC 16.2*  < > 19.9* 20.5* 15.9* 20.1* 17.8*  NEUTROABS 14.7*  --   --   --   --   --   --   HGB 15.3  < > 14.8 14.6 14.5 14.9 15.2  HCT 48.2  < > 46.2 45.8 44.9 47.9 50.0  MCV 83.5  < > 84.8 85.3 84.6 87.4 88.2  PLT 410*  < > 557* 687* 569* 750* 839*  < > = values in this interval not displayed. Cardiac Enzymes:  Recent Labs Lab 08/07/13 1600 08/08/13 0258 08/08/13 0914 08/08/13 2000  CKTOTAL 63  --   --   --   TROPONINI 1.90* 2.97* 2.21* 1.93*   BNP (last 3 results)  Recent Labs  08/07/13 1600  PROBNP 13375.0*   CBG:  Recent Labs Lab 08/08/13 1947  GLUCAP 152*    Recent Results (from the past 240 hour(s))  CULTURE, BLOOD (ROUTINE X 2)     Status: None   Collection Time    08/07/13  5:10 PM      Result Value Ref Range Status   Specimen Description BLOOD ARM LEFT   Final   Special Requests BOTTLES DRAWN AEROBIC AND ANAEROBIC 5CC   Final   Culture  Setup Time     Final   Value: 08/07/2013 23:11     Performed at [facility]   Culture     Final   Value: NO GROWTH 5 DAYS     Performed at [facility]   Report Status 08/13/2013 FINAL   Final  CULTURE, BLOOD (ROUTINE X 2)     Status: None   Collection Time    08/07/13  5:16 PM      Result Value Ref Range Status   Specimen Description BLOOD HAND LEFT   Final   Special Requests BOTTLES DRAWN  AEROBIC ONLY 5CC   Final   Culture  Setup Time     Final   Value: 08/07/2013 23:11     Performed at [facility]   Culture     Final   Value: NO GROWTH 5 DAYS     Performed at [facility]   Report Status 08/13/2013 FINAL   Final  URINE CULTURE     Status: None   Collection Time  08/07/13  8:24 PM      Result Value Ref Range Status   Specimen Description URINE, CLEAN CATCH   Final   Special Requests NONE   Final   Culture  Setup Time     Final   Value: 08/07/2013 21:37     Performed at [facility]     Final   Value: 3,000 COLONIES/ML     Performed at [facility]   Culture     Final   Value: INSIGNIFICANT GROWTH     Performed at [facility]   Report Status 08/08/2013 FINAL   Final  MRSA PCR SCREENING     Status: Abnormal   Collection Time    08/08/13  1:59 AM      Result Value Ref Range Status   MRSA by PCR POSITIVE (*) NEGATIVE Final   Comment:            The GeneXpert MRSA Assay (FDA     approved for NASAL specimens     only), is one component of a     comprehensive MRSA colonization     surveillance program. It is not     intended to diagnose MRSA     infection nor to guide or     monitor treatment for     MRSA infections.     RESULT CALLED TO, READ BACK BY AND VERIFIED WITH:     H.RICHARD RN [phone] 08/08/13 E.GADDY  BODY FLUID CULTURE     Status: None   Collection Time    08/10/13  3:30 PM      Result Value Ref Range Status   Specimen Description FLUID PERICARDIAL   Final   Special Requests NONE   Final   Gram Stain     Final   Value: NO WBC SEEN     NO ORGANISMS SEEN     Performed at [facility]   Culture     Final   Value: NO GROWTH 2 DAYS     Performed at [facility]   Report Status PENDING   Incomplete     Studies: No results found.  Scheduled Meds: . antiseptic oral rinse  15 mL Mouth Rinse q12n4p  . aspirin  325 mg Oral Daily  . atorvastatin  80 mg Oral q1800  .  carvedilol  6.25 mg Oral BID WC  . chlorhexidine  15 mL Mouth Rinse BID  . clopidogrel  75 mg Oral Q breakfast  . colchicine  0.6 mg Oral BID  . collagenase   Topical Daily  . feeding supplement (ENSURE COMPLETE)  237 mL Oral BID BM  . folic acid  1 mg Oral Daily  . furosemide  40 mg Oral Daily  . gabapentin  300 mg Oral TID  . heparin subcutaneous  5,000 Units Subcutaneous 3 times per day  . lisinopril  5 mg Oral Daily  . multivitamin with minerals  1 tablet Oral Daily  . sodium chloride  3 mL Intravenous Q12H  . spironolactone  12.5 mg Oral Daily  . thiamine  100 mg Oral Daily   Continuous Infusions:    Principal Problem:   Ischemia of extremity right leg Active Problems:   HYPERLIPIDEMIA-MIXED   POLYCYTHEMIA   HYPERTENSION   PERIPHERAL VASCULAR DISEASE   Leg ulcer   ST elevation myocardial infarction (STEMI) in recovery phase   Coronary atherosclerosis of native coronary artery   Acute systolic heart failure  CAD (coronary artery disease)   Time spent: 54min  Francia Verry K Laurin Paulo  [facility] Pager [phone]. If 7PM-7AM, please contact night-coverage at [email or web address], password [facility] 08/14/2013, 8:01 AM  LOS: 7 days

## 2013-08-14 NOTE — Progress Notes (Signed)
Thank you for consult on Mr. Abel. Patient was admitted with NSTEMI and progressive right foot pain. He lives alone and has been wheelchair bound due to RLE pain. Cardiac rehab and PT attempted but patient unable to tolerate standing activity due to foot pain. Unless participation improves doubt that patient will abe able to tolerate 3 hours/day of therapy on CIR. Would recommend SNF for follow up therapy. Will defer CIR consult for now.

## 2013-08-15 ENCOUNTER — Inpatient Hospital Stay (HOSPITAL_COMMUNITY): Payer: Medicare Other

## 2013-08-15 DIAGNOSIS — D72829 Elevated white blood cell count, unspecified: Secondary | ICD-10-CM

## 2013-08-15 LAB — BASIC METABOLIC PANEL
BUN: 24 mg/dL — ABNORMAL HIGH (ref 6–23)
CALCIUM: 9.2 mg/dL (ref 8.4–10.5)
CO2: 33 meq/L — AB (ref 19–32)
CREATININE: 0.97 mg/dL (ref 0.50–1.35)
Chloride: 101 mEq/L (ref 96–112)
GFR calc Af Amer: >90 mL/min (ref >90)
GFR calc non Af Amer: 86 mL/min — ABNORMAL LOW (ref >90)
GLUCOSE: 110 mg/dL — AB (ref 70–99)
Potassium: 5.1 mEq/L (ref 3.7–5.3)
Sodium: 142 mEq/L (ref 137–147)

## 2013-08-15 LAB — CBC
HCT: 48.8 % (ref 39.0–52.0)
HEMOGLOBIN: 15.3 g/dL (ref 13.0–17.0)
MCH: 27.4 pg (ref 26.0–34.0)
MCHC: 31.4 g/dL (ref 30.0–36.0)
MCV: 87.5 fL (ref 78.0–100.0)
Platelets: 850 10*3/uL — ABNORMAL HIGH (ref 150–400)
RBC: 5.58 MIL/uL (ref 4.22–5.81)
RDW: 19.1 % — ABNORMAL HIGH (ref 11.5–15.5)
WBC: 22 10*3/uL — ABNORMAL HIGH (ref 4.0–10.5)

## 2013-08-15 MED ORDER — FUROSEMIDE 10 MG/ML IJ SOLN
40.0000 mg | Freq: Three times a day (TID) | INTRAMUSCULAR | Status: DC
  Administered 2013-08-15 – 2013-08-17 (×6): 40 mg via INTRAVENOUS
  Filled 2013-08-15 (×11): qty 4

## 2013-08-15 NOTE — Progress Notes (Signed)
Physical Therapy Treatment Patient Details Name: Fernando Lane MRN: [id number] DOB: 1950/08/19 Today's Date: 08/15/2013    History of Present Illness 63 yo male with Rt ichemic Limb and Nstemi    PT Comments    Pt admitted with above. Pt currently with functional limitations due to balance and endurance deficits.  Pt not feeling well today and only agreeable to get into chair.   Pt will benefit from skilled PT to increase their independence and safety with mobility to allow discharge to the venue listed below.   Follow Up Recommendations  CIR;Supervision for mobility/OOB     Equipment Recommendations  Rolling walker with 5" wheels;3in1 (PT)    Recommendations for Other Services Rehab consult     Precautions / Restrictions Precautions Precautions: Fall Precaution Comments: rt foot x2 ulcers Restrictions Weight Bearing Restrictions: No    Mobility  Bed Mobility Overal bed mobility: Needs Assistance Bed Mobility: Supine to Sit     Supine to sit: Min assist;Mod assist Sit to supine: Min assist      Transfers Overall transfer level: Needs assistance Equipment used: 1 person hand held assist Transfers: Sit to/from [facility] Sit to Stand: Mod assist Stand pivot transfers: Mod assist       General transfer comment: Pt needed cues for hand placement.  Pt needing UE support by PT and using chair arm for support on other side. Pt able to take pivotal steps to get to chair but does use therapist to support himself on one side.  He says he has a friend Fernando Lane who provides this assist at home. Notable difficulty weight bearing on right LE.    Ambulation/Gait                 Stairs            Wheelchair Mobility    Modified Rankin (Stroke Patients Only)       Balance Overall balance assessment: Needs assistance;History of Falls Sitting-balance support: Bilateral upper extremity supported;Feet supported Sitting balance-Leahy Scale: Fair     Standing balance support: Bilateral upper extremity supported;During functional activity Standing balance-Leahy Scale: Poor Standing balance comment: Pt requires UE support for balance.                      Cognition Arousal/Alertness: Awake/alert Behavior During Therapy: WFL for tasks assessed/performed Overall Cognitive Status: Impaired/Different from baseline Area of Impairment: Safety/judgement     Memory: Decreased short-term memory   Safety/Judgement: Decreased awareness of deficits          Exercises General Exercises - Lower Extremity Ankle Circles/Pumps: AROM;Both;10 reps;Seated Long Arc Quad: AROM;Both;5 reps;Seated Hip Flexion/Marching: AROM;Both;10 reps;Seated    General Comments        Pertinent Vitals/Pain VSS, pain bil LEs    Home Living                      Prior Function            PT Goals (current goals can now be found in the care plan section) Progress towards PT goals: Progressing toward goals    Frequency  Min 3X/week    PT Plan Current plan remains appropriate    Co-evaluation             End of Session Equipment Utilized During Treatment: Gait belt Activity Tolerance: Patient limited by pain;Patient limited by fatigue Patient left: in chair;with call bell/phone within reach     Time: [phone]  PT Time Calculation (min): 24 min  Charges:  $Therapeutic Exercise: 8-22 mins $Therapeutic Activity: 8-22 mins                    G Codes:      Fernando Lane 08-29-2013, 11:16 AM Fernando Lane Acute Rehabilitation [phone] (pager)

## 2013-08-15 NOTE — Progress Notes (Signed)
[facility] PROGRESS NOTE  CORRELL DENBOW OZD:[id number] DOB: 1951/02/10 DOA: 08/07/2013 PCP: Gwendolyn Grant, MD  Off service summary: [phone] who initially presented to with suspicion for arterial occlusion of the RLE. Vascular surgery was consulted. Shortly after admission, the patient was found to have NSTEMI and cardiology was consulted as well. The patient had a heart cath demonstrating multiple vessel disease, not PCI or CABG candidate. Aortogram confirmed blood flow in the RLE and no further vascular intervention recommended. The patient later was found to have a large pericardial effusion on echo thought to be secondary to pericarditis following NSTEMI. On 5/29, the patient became clinically unstable and required emergent pericardiocentesis. Pt has since remained stable with tentative plans for d/c to SNF in the next 1-2 days if he remains stable.  Assessment/Plan: 1. STEMI 1. Cardiology following 2. Pt now s/p heart cath revealing severe 2 vessel occlusive dz involving mid-dist LAD and RCA with LVEF of 20%. Recs for med management w/ ASA, statin 3. Heparin was stopped. Plavix added to ASA 5/30 4. No LV thrombus on echo but RV infarction demonstrated 5. Not a PCI or CABG candidate 2. RLE ischemia 1. Vascular surgery was initially consulted 2. On ASA 3. S/p aortogram 5/27 with arterial flow noted - no further surgical recs and vascular surgery has since signed off 5/28 4. Outpatient wound care 3. HTN 1. BP currently stable, controlled 4. HLD 1. On statin 5. Polycythemia 1. Hgb remains w/in normal limits  6. Acute systolic CHF 1. On lasix, changed to PO 5/30 secondary to rising Cr 2. Spironolactone added, increased to 25mg , cont beta blocker 3. Net pos 1.1L per flow sheet since admit 7. Large pericardial effusion secondary to pericarditis 1. Clinically decompensated 5/29, prompting emergent pericardiocentesis 2. Clinically much improved since procedure 3. Now on colchicine   8. DVT prophylaxis 1. Heparin gtt stopped, will cont with subq prophylactic heparin 9. Cough 1. WBC elevating 2. CXR ordered 3. Diuresis per Cardiology  10. Stasis Ulcers 1. On exam, good granulation with no surrounding erythema and no active drainae 2. Monitor for now 3. Cont dressing changes 11. Leukocytosis 1. Unclear etiology 2. Afebrile 3. Recent UA unremarkable 4. Recent pan cultures neg 5. ? Secondary to above pericarditis 6. Monitor for now  Code Status: Full Family Communication: Pt in room Disposition Plan: Pending - [facility] and Rehab for short-term SNF when medically ready   Consultants:  Vascular surgery  Cardiology  Procedures:  Heart cath 5/27  Aortogram 5/27  Emergent Pericardiocentesis 5/29  Antibiotics: none  HPI/Subjective: C/o productive cough  Objective: Filed Vitals:   08/14/13 1938 08/14/13 2153 08/15/13 0435 08/15/13 0600  BP: 103/76  116/79 116/79  Pulse: 77 75 90 90  Temp: 97.9 F (36.6 C)  97.3 F (36.3 C)   TempSrc: Oral  Oral   Resp: 18 18 18    Height:      Weight:   100.2 kg (220 lb 14.4 oz)   SpO2: 95% 95% 95%     Intake/Output Summary (Last 24 hours) at 08/15/13 1225 Last data filed at 08/14/13 2144  Gross per 24 hour  Intake    243 ml  Output    205 ml  Net     38 ml   Filed Weights   08/13/13 0455 08/14/13 0547 08/15/13 0435  Weight: 102 kg (224 lb 13.9 oz) 100.064 kg (220 lb 9.6 oz) 100.2 kg (220 lb 14.4 oz)    Exam:   General:  Awake, in  nad  Cardiovascular: regular, s1, s2  Respiratory: normal resp effort, no wheezing  Abdomen: soft, obese, pos bs  Musculoskeletal: palpable DP on R, L foot cold to touch   Data Reviewed: Basic Metabolic Panel:  Recent Labs Lab 08/11/13 0242 08/12/13 0247 08/13/13 0314 08/14/13 0815 08/15/13 0440  NA 139 141 141 139 142  K 4.7 4.5 4.9 4.8 5.1  CL 103 102 103 99 101  CO2 22 29 26 31  33*  GLUCOSE 117* 113* 95 145* 110*  BUN 38* 36* 34* 26* 24*   CREATININE 1.32 1.10 0.93 0.85 0.97  CALCIUM 8.5 9.1 9.1 9.3 9.2   Liver Function Tests: No results found for this basename: AST, ALT, ALKPHOS, BILITOT, PROT, ALBUMIN,  in the last 168 hours No results found for this basename: LIPASE, AMYLASE,  in the last 168 hours No results found for this basename: AMMONIA,  in the last 168 hours CBC:  Recent Labs Lab 08/11/13 0242 08/12/13 0247 08/13/13 0314 08/14/13 0815 08/15/13 0440  WBC 15.9* 20.1* 17.8* 19.8* 22.0*  HGB 14.5 14.9 15.2 16.1 15.3  HCT 44.9 47.9 50.0 49.9 48.8  MCV 84.6 87.4 88.2 86.9 87.5  PLT 569* 750* 839* 776* 850*   Cardiac Enzymes:  Recent Labs Lab 08/08/13 2000  TROPONINI 1.93*   BNP (last 3 results)  Recent Labs  08/07/13 1600  PROBNP 13375.0*   CBG:  Recent Labs Lab 08/08/13 1947  GLUCAP 152*    Recent Results (from the past 240 hour(s))  CULTURE, BLOOD (ROUTINE X 2)     Status: None   Collection Time    08/07/13  5:10 PM      Result Value Ref Range Status   Specimen Description BLOOD ARM LEFT   Final   Special Requests BOTTLES DRAWN AEROBIC AND ANAEROBIC 5CC   Final   Culture  Setup Time     Final   Value: 08/07/2013 23:11     Performed at [facility]     Final   Value: NO GROWTH 5 DAYS     Performed at [facility]   Report Status 08/13/2013 FINAL   Final  CULTURE, BLOOD (ROUTINE X 2)     Status: None   Collection Time    08/07/13  5:16 PM      Result Value Ref Range Status   Specimen Description BLOOD HAND LEFT   Final   Special Requests BOTTLES DRAWN AEROBIC ONLY 5CC   Final   Culture  Setup Time     Final   Value: 08/07/2013 23:11     Performed at [facility]   Culture     Final   Value: NO GROWTH 5 DAYS     Performed at [facility]   Report Status 08/13/2013 FINAL   Final  URINE CULTURE     Status: None   Collection Time    08/07/13  8:24 PM      Result Value Ref Range Status   Specimen Description URINE, CLEAN CATCH   Final    Special Requests NONE   Final   Culture  Setup Time     Final   Value: 08/07/2013 21:37     Performed at [place]     Final   Value: 3,000 COLONIES/ML     Performed at [facility]   Culture     Final   Value: INSIGNIFICANT GROWTH     Performed  at [facility]   Report Status 08/08/2013 FINAL   Final  MRSA PCR SCREENING     Status: Abnormal   Collection Time    08/08/13  1:59 AM      Result Value Ref Range Status   MRSA by PCR POSITIVE (*) NEGATIVE Final   Comment:            The GeneXpert MRSA Assay (FDA     approved for NASAL specimens     only), is one component of a     comprehensive MRSA colonization     surveillance program. It is not     intended to diagnose MRSA     infection nor to guide or     monitor treatment for     MRSA infections.     RESULT CALLED TO, READ BACK BY AND VERIFIED WITH:     H.RICHARD RN [phone] 08/08/13 E.GADDY  BODY FLUID CULTURE     Status: None   Collection Time    08/10/13  3:30 PM      Result Value Ref Range Status   Specimen Description FLUID PERICARDIAL   Final   Special Requests NONE   Final   Gram Stain     Final   Value: NO WBC SEEN     NO ORGANISMS SEEN     Performed at [facility]   Culture     Final   Value: NO GROWTH 3 DAYS     Performed at [facility]   Report Status 08/14/2013 FINAL   Final     Studies: No results found.  Scheduled Meds: . antiseptic oral rinse  15 mL Mouth Rinse q12n4p  . aspirin  325 mg Oral Daily  . atorvastatin  80 mg Oral q1800  . carvedilol  9.375 mg Oral BID WC  . chlorhexidine  15 mL Mouth Rinse BID  . clopidogrel  75 mg Oral Q breakfast  . colchicine  0.6 mg Oral BID  . collagenase   Topical Daily  . feeding supplement (ENSURE COMPLETE)  237 mL Oral BID BM  . folic acid  1 mg Oral Daily  . furosemide  40 mg Intravenous Q8H  . gabapentin  300 mg Oral TID  . heparin subcutaneous  5,000 Units Subcutaneous 3 times per day  .  lisinopril  5 mg Oral Daily  . multivitamin with minerals  1 tablet Oral Daily  . sodium chloride  3 mL Intravenous Q12H  . spironolactone  12.5 mg Oral Daily  . thiamine  100 mg Oral Daily   Continuous Infusions:    Principal Problem:   Ischemia of extremity right leg Active Problems:   HYPERLIPIDEMIA-MIXED   POLYCYTHEMIA   HYPERTENSION   PERIPHERAL VASCULAR DISEASE   Leg ulcer   ST elevation myocardial infarction (STEMI) in recovery phase   Coronary atherosclerosis of native coronary artery   Acute systolic heart failure   CAD (coronary artery disease)   Time spent: 4min  Ezequias Lard U Deztinee Lohmeyer  [facility] Pager [phone]. If 7PM-7AM, please contact night-coverage at [email or web address], password [facility] 08/15/2013, 12:25 PM  LOS: 8 days
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
## 2013-08-16 ENCOUNTER — Other Ambulatory Visit: Payer: Medicare Other

## 2013-08-16 ENCOUNTER — Ambulatory Visit: Payer: Medicare Other

## 2013-08-16 ENCOUNTER — Other Ambulatory Visit: Payer: Self-pay | Admitting: Medical Oncology

## 2013-08-16 LAB — CBC
HEMATOCRIT: 50.4 % (ref 39.0–52.0)
Hemoglobin: 16.3 g/dL (ref 13.0–17.0)
MCH: 28 pg (ref 26.0–34.0)
MCHC: 32.3 g/dL (ref 30.0–36.0)
MCV: 86.4 fL (ref 78.0–100.0)
PLATELETS: 856 10*3/uL — AB (ref 150–400)
RBC: 5.83 MIL/uL — AB (ref 4.22–5.81)
RDW: 19.1 % — ABNORMAL HIGH (ref 11.5–15.5)
WBC: 19 10*3/uL — ABNORMAL HIGH (ref 4.0–10.5)

## 2013-08-16 LAB — BASIC METABOLIC PANEL
BUN: 27 mg/dL — AB (ref 6–23)
CALCIUM: 9.4 mg/dL (ref 8.4–10.5)
CO2: 30 meq/L (ref 19–32)
Chloride: 98 mEq/L (ref 96–112)
Creatinine, Ser: 0.84 mg/dL (ref 0.50–1.35)
GFR calc Af Amer: >90 mL/min (ref >90)
GFR calc non Af Amer: >90 mL/min (ref >90)
Glucose, Bld: 108 mg/dL — ABNORMAL HIGH (ref 70–99)
Potassium: 4.4 mEq/L (ref 3.7–5.3)
Sodium: 142 mEq/L (ref 137–147)

## 2013-08-16 MED ORDER — COLCHICINE 0.6 MG PO TABS
0.6000 mg | ORAL_TABLET | Freq: Every day | ORAL | Status: DC
  Administered 2013-08-16 – 2013-08-17 (×2): 0.6 mg via ORAL
  Filled 2013-08-16 (×2): qty 1

## 2013-08-16 MED ORDER — ASPIRIN 81 MG PO CHEW
81.0000 mg | CHEWABLE_TABLET | Freq: Every day | ORAL | Status: DC
  Administered 2013-08-16 – 2013-08-17 (×2): 81 mg via ORAL
  Filled 2013-08-16 (×2): qty 1

## 2013-08-16 NOTE — Progress Notes (Signed)
Pt placed on CPAP via [facility].  Current settings are 10 CMH2O, pt tolerating well at this time.  RT to monitor and assess as needed.

## 2013-08-16 NOTE — Progress Notes (Signed)
[facility] PROGRESS NOTE  Fernando Lane EXH:[id number] DOB: [birth year] DOA: 08/07/2013 PCP: Gwendolyn Grant, MD  Off service summary: [phone] who initially presented to with suspicion for arterial occlusion of the RLE. Vascular surgery was consulted. Shortly after admission, the patient was found to have NSTEMI and cardiology was consulted as well. The patient had a heart cath demonstrating multiple vessel disease, not PCI or CABG candidate. Aortogram confirmed blood flow in the RLE and no further vascular intervention recommended. The patient later was found to have a large pericardial effusion on echo thought to be secondary to pericarditis following NSTEMI. On 5/29, the patient became clinically unstable and required emergent pericardiocentesis. Pt has since remained stable with tentative plans for d/c to SNF in the next 1-2 days if he remains stable.  Assessment/Plan: 1. STEMI 1. Cardiology following 2. Pt now s/p heart cath revealing severe 2 vessel occlusive dz involving mid-dist LAD and RCA with LVEF of 20%. Recs for med management w/ ASA, statin 3. Heparin was stopped. Plavix added to ASA 5/30 4. No LV thrombus on echo but RV infarction demonstrated 5. Not a PCI or CABG candidate 2. RLE ischemia 1. Vascular surgery was initially consulted 2. On ASA 3. S/p aortogram 5/27 with arterial flow noted - no further surgical recs and vascular surgery has since signed off 5/28- if pain is severe, only option is amputation 4. Outpatient wound care 3. HTN 1. BP currently stable, controlled 4. HLD 1. On statin 5. Polycythemia 1. Hgb remains w/in normal limits  6. Acute systolic CHF 1. On lasix, changed to PO 5/30 secondary to rising Cr 2. Spironolactone added, increased to 25mg , cont beta blocker 3. Net pos 1.1L per flow sheet since admit 7. Large pericardial effusion secondary to pericarditis 1. Clinically decompensated 5/29, prompting emergent pericardiocentesis 2. Clinically much  improved since procedure 3. Now on colchicine x 44months 8. DVT prophylaxis 1. Heparin gtt stopped, will cont with subq prophylactic heparin 9. Cough 1. WBC elevating 2. CXR ordered- nothing acute 3. Diuresis per Cardiology  10. Stasis Ulcers 1. On exam, good granulation with no surrounding erythema and no active drainae 2. Monitor for now 3. Cont dressing changes 11. Leukocytosis 1. Unclear etiology 2. Afebrile 3. Recent UA unremarkable 4. Recent pan cultures neg 5. ? Secondary to above pericarditis 6. Monitor for now  Code Status: Full Family Communication: Pt in room Disposition Plan: Pending - [place] for short-term SNF when medically ready- hope for 6/5   Consultants:  Vascular surgery  Cardiology  Procedures:  Heart cath 5/27  Aortogram 5/27  Emergent Pericardiocentesis 5/29  Antibiotics: none  HPI/Subjective: Foot hurting  Objective: Filed Vitals:   08/15/13 0600 08/15/13 1414 08/15/13 2225 08/16/13 0551  BP: 116/79 107/60 97/70 105/67  Pulse: 90 78 86 76  Temp:  98.4 F (36.9 C) 98.1 F (36.7 C) 98.7 F (37.1 C)  TempSrc:  Oral Oral Oral  Resp:  18 18 18   Height:      Weight:    97.523 kg (215 lb)  SpO2:  97% 97% 95%    Intake/Output Summary (Last 24 hours) at 08/16/13 1108 Last data filed at 08/16/13 0400  Gross per 24 hour  Intake    240 ml  Output   3126 ml  Net  -2886 ml   Filed Weights   08/14/13 0547 08/15/13 0435 08/16/13 0551  Weight: 100.064 kg (220 lb 9.6 oz) 100.2 kg (220 lb 14.4 oz) 97.523 kg (215 lb)  Exam:   General:  Awake, in nad  Cardiovascular: regular, s1, s2  Respiratory: normal resp effort, no wheezing  Abdomen: soft, obese, pos bs  Musculoskeletal: palpable DP on R, L foot cold to touch   Data Reviewed: Basic Metabolic Panel:  Recent Labs Lab 08/12/13 0247 08/13/13 0314 08/14/13 0815 08/15/13 0440 08/16/13 0312  NA 141 141 139 142 142  K 4.5 4.9 4.8 5.1 4.4  CL 102 103  99 101 98  CO2 29 26 31  33* 30  GLUCOSE 113* 95 145* 110* 108*  BUN 36* 34* 26* 24* 27*  CREATININE 1.10 0.93 0.85 0.97 0.84  CALCIUM 9.1 9.1 9.3 9.2 9.4   Liver Function Tests: No results found for this basename: AST, ALT, ALKPHOS, BILITOT, PROT, ALBUMIN,  in the last 168 hours No results found for this basename: LIPASE, AMYLASE,  in the last 168 hours No results found for this basename: AMMONIA,  in the last 168 hours CBC:  Recent Labs Lab 08/12/13 0247 08/13/13 0314 08/14/13 0815 08/15/13 0440 08/16/13 0312  WBC 20.1* 17.8* 19.8* 22.0* 19.0*  HGB 14.9 15.2 16.1 15.3 16.3  HCT 47.9 50.0 49.9 48.8 50.4  MCV 87.4 88.2 86.9 87.5 86.4  PLT 750* 839* 776* 850* 856*   Cardiac Enzymes: No results found for this basename: CKTOTAL, CKMB, CKMBINDEX, TROPONINI,  in the last 168 hours BNP (last 3 results)  Recent Labs  08/07/13 1600  PROBNP 13375.0*   CBG: No results found for this basename: GLUCAP,  in the last 168 hours  Recent Results (from the past 240 hour(s))  CULTURE, BLOOD (ROUTINE X 2)     Status: None   Collection Time    08/07/13  5:10 PM      Result Value Ref Range Status   Specimen Description BLOOD ARM LEFT   Final   Special Requests BOTTLES DRAWN AEROBIC AND ANAEROBIC 5CC   Final   Culture  Setup Time     Final   Value: 08/07/2013 23:11     Performed at [facility]     Final   Value: NO GROWTH 5 DAYS     Performed at [facility]   Report Status 08/13/2013 FINAL   Final  CULTURE, BLOOD (ROUTINE X 2)     Status: None   Collection Time    08/07/13  5:16 PM      Result Value Ref Range Status   Specimen Description BLOOD HAND LEFT   Final   Special Requests BOTTLES DRAWN AEROBIC ONLY 5CC   Final   Culture  Setup Time     Final   Value: 08/07/2013 23:11     Performed at [place]     Final   Value: NO GROWTH 5 DAYS     Performed at [facility]   Report Status 08/13/2013 FINAL   Final  URINE  CULTURE     Status: None   Collection Time    08/07/13  8:24 PM      Result Value Ref Range Status   Specimen Description URINE, CLEAN CATCH   Final   Special Requests NONE   Final   Culture  Setup Time     Final   Value: 08/07/2013 21:37     Performed at [place]     Final   Value: 3,000 COLONIES/ML     Performed at [facility]  Final   Value: INSIGNIFICANT GROWTH     Performed at [facility]   Report Status 08/08/2013 FINAL   Final  MRSA PCR SCREENING     Status: Abnormal   Collection Time    08/08/13  1:59 AM      Result Value Ref Range Status   MRSA by PCR POSITIVE (*) NEGATIVE Final   Comment:            The GeneXpert MRSA Assay (FDA     approved for NASAL specimens     only), is one component of a     comprehensive MRSA colonization     surveillance program. It is not     intended to diagnose MRSA     infection nor to guide or     monitor treatment for     MRSA infections.     RESULT CALLED TO, READ BACK BY AND VERIFIED WITH:     H.RICHARD RN [phone] 08/08/13 E.GADDY  BODY FLUID CULTURE     Status: None   Collection Time    08/10/13  3:30 PM      Result Value Ref Range Status   Specimen Description FLUID PERICARDIAL   Final   Special Requests NONE   Final   Gram Stain     Final   Value: NO WBC SEEN     NO ORGANISMS SEEN     Performed at [facility]   Culture     Final   Value: NO GROWTH 3 DAYS     Performed at [facility]   Report Status 08/14/2013 FINAL   Final     Studies: Dg Chest 2 View  08/15/2013   CLINICAL DATA:  Shortness of breath, chest pain, cough  EXAM: CHEST  2 VIEW  COMPARISON:  May 29 20/50  FINDINGS: The heart size and mediastinal contours are stable. The heart size is enlarged. The aorta is tortuous. There is no focal infiltrate, pulmonary edema, or pleural effusion. There is mild scar or atelectasis of lateral left lung base. The visualized skeletal structures are stable.   IMPRESSION: No active cardiopulmonary disease.   Electronically Signed   By: Abelardo Diesel M.D.   On: 08/15/2013 13:50    Scheduled Meds: . antiseptic oral rinse  15 mL Mouth Rinse q12n4p  . aspirin  81 mg Oral Daily  . atorvastatin  80 mg Oral q1800  . carvedilol  9.375 mg Oral BID WC  . chlorhexidine  15 mL Mouth Rinse BID  . clopidogrel  75 mg Oral Q breakfast  . colchicine  0.6 mg Oral Daily  . collagenase   Topical Daily  . feeding supplement (ENSURE COMPLETE)  237 mL Oral BID BM  . folic acid  1 mg Oral Daily  . furosemide  40 mg Intravenous Q8H  . gabapentin  300 mg Oral TID  . heparin subcutaneous  5,000 Units Subcutaneous 3 times per day  . lisinopril  5 mg Oral Daily  . multivitamin with minerals  1 tablet Oral Daily  . sodium chloride  3 mL Intravenous Q12H  . spironolactone  12.5 mg Oral Daily  . thiamine  100 mg Oral Daily   Continuous Infusions:    Principal Problem:   Ischemia of extremity right leg Active Problems:   HYPERLIPIDEMIA-MIXED   POLYCYTHEMIA   HYPERTENSION   PERIPHERAL VASCULAR DISEASE   Leg ulcer   ST elevation myocardial infarction (STEMI) in recovery phase   Coronary atherosclerosis of  native coronary artery   Acute systolic heart failure   CAD (coronary artery disease)   Time spent: 60min  Dereke Neumann U Lamiah Marmol  [facility] Pager [phone]. If 7PM-7AM, please contact night-coverage at [email or web address], password [facility] 08/16/2013, 11:08 AM  LOS: 9 days
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
## 2013-08-17 DIAGNOSIS — G4733 Obstructive sleep apnea (adult) (pediatric): Secondary | ICD-10-CM

## 2013-08-17 DIAGNOSIS — L97909 Non-pressure chronic ulcer of unspecified part of unspecified lower leg with unspecified severity: Secondary | ICD-10-CM

## 2013-08-17 LAB — CBC
HCT: 52.7 % — ABNORMAL HIGH (ref 39.0–52.0)
Hemoglobin: 16.2 g/dL (ref 13.0–17.0)
MCH: 27 pg (ref 26.0–34.0)
MCHC: 30.7 g/dL (ref 30.0–36.0)
MCV: 87.7 fL (ref 78.0–100.0)
PLATELETS: 755 10*3/uL — AB (ref 150–400)
RBC: 6.01 MIL/uL — ABNORMAL HIGH (ref 4.22–5.81)
RDW: 19.1 % — AB (ref 11.5–15.5)
WBC: 20.2 10*3/uL — AB (ref 4.0–10.5)

## 2013-08-17 LAB — BASIC METABOLIC PANEL
BUN: 31 mg/dL — AB (ref 6–23)
CALCIUM: 9.5 mg/dL (ref 8.4–10.5)
CHLORIDE: 98 meq/L (ref 96–112)
CO2: 32 mEq/L (ref 19–32)
CREATININE: 0.95 mg/dL (ref 0.50–1.35)
GFR calc non Af Amer: 87 mL/min — ABNORMAL LOW (ref >90)
Glucose, Bld: 102 mg/dL — ABNORMAL HIGH (ref 70–99)
Potassium: 4.3 mEq/L (ref 3.7–5.3)
Sodium: 140 mEq/L (ref 137–147)

## 2013-08-17 MED ORDER — CLOPIDOGREL BISULFATE 75 MG PO TABS: 75.0000 mg | ORAL_TABLET | Freq: Every day | ORAL | Status: DC

## 2013-08-17 MED ORDER — FOLIC ACID 1 MG PO TABS: 1.0000 mg | ORAL_TABLET | Freq: Every day | ORAL | Status: DC

## 2013-08-17 MED ORDER — LISINOPRIL 5 MG PO TABS: 5.0000 mg | ORAL_TABLET | Freq: Every day | ORAL | Status: DC

## 2013-08-17 MED ORDER — ENSURE COMPLETE PO LIQD: 237.0000 mL | Freq: Two times a day (BID) | ORAL | Status: AC

## 2013-08-17 MED ORDER — THIAMINE HCL 100 MG PO TABS: 100.0000 mg | ORAL_TABLET | Freq: Every day | ORAL | Status: AC

## 2013-08-17 MED ORDER — CHLORHEXIDINE GLUCONATE 0.12 % MT SOLN: 15.0000 mL | Freq: Two times a day (BID) | OROMUCOSAL | Status: DC

## 2013-08-17 MED ORDER — SPIRONOLACTONE 12.5 MG HALF TABLET: 12.5000 mg | ORAL_TABLET | Freq: Every day | ORAL | Status: DC

## 2013-08-17 MED ORDER — FUROSEMIDE 40 MG PO TABS: 40.0000 mg | ORAL_TABLET | Freq: Two times a day (BID) | ORAL | Status: DC

## 2013-08-17 MED ORDER — ATORVASTATIN CALCIUM 80 MG PO TABS: 80.0000 mg | ORAL_TABLET | Freq: Every day | ORAL | Status: DC

## 2013-08-17 MED ORDER — CARVEDILOL 12.5 MG PO TABS: 12.5000 mg | ORAL_TABLET | Freq: Two times a day (BID) | ORAL | Status: DC

## 2013-08-17 MED ORDER — ASPIRIN 81 MG PO CHEW: 81.0000 mg | CHEWABLE_TABLET | Freq: Every day | ORAL | Status: DC

## 2013-08-17 MED ORDER — ADULT MULTIVITAMIN W/MINERALS CH: 1.0000 | ORAL_TABLET | Freq: Every day | ORAL | Status: DC

## 2013-08-17 MED ORDER — COLCHICINE 0.6 MG PO TABS: 0.6000 mg | ORAL_TABLET | Freq: Every day | ORAL | Status: DC

## 2013-08-17 MED ORDER — COLLAGENASE 250 UNIT/GM EX OINT: TOPICAL_OINTMENT | Freq: Every day | CUTANEOUS | Status: DC

## 2013-08-17 MED ORDER — OXYCODONE-ACETAMINOPHEN 5-325 MG PO TABS: 1.0000 | ORAL_TABLET | ORAL | Status: DC | PRN

## 2013-08-17 MED ORDER — BENZONATATE 100 MG PO CAPS
100.0000 mg | ORAL_CAPSULE | Freq: Three times a day (TID) | ORAL | Status: DC | PRN
Start: 2013-08-17 — End: 2013-08-27

## 2013-08-17 MED ORDER — FUROSEMIDE 40 MG PO TABS
40.0000 mg | ORAL_TABLET | Freq: Two times a day (BID) | ORAL | Status: DC
  Administered 2013-08-17: 40 mg via ORAL
  Filled 2013-08-17 (×3): qty 1

## 2013-08-17 MED ORDER — HYDROCOD POLST-CHLORPHEN POLST 10-8 MG/5ML PO LQCR: 5.0000 mL | Freq: Two times a day (BID) | ORAL | Status: DC | PRN

## 2013-08-17 MED ORDER — CARVEDILOL 12.5 MG PO TABS
12.5000 mg | ORAL_TABLET | Freq: Two times a day (BID) | ORAL | Status: DC
  Administered 2013-08-17: 12.5 mg via ORAL
  Filled 2013-08-17 (×3): qty 1

## 2013-08-17 NOTE — Progress Notes (Signed)
CSW to remain following and to assist with dc today.  Hunt Oris, MSW, [place]
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
## 2013-08-17 NOTE — Progress Notes (Signed)
CSW informed that pt is ready for dc to [facility]. CSW informed pt who requested that CSW contact pt son. Pt stated he was expecting to leave tomorrow however CSW confirmed with both RN and MD that pt was informed of dc today. CSW confirmed with Fransisco Beau from [facility] that their facility will pick up patient at 16:30p today. Fransisco Beau made aware that pt is on isolation precautions for MRSA (nasal). Pt son aware and agreeable of plan. CSW signing off.  Hunt Oris, MSW, [place]

## 2013-08-17 NOTE — Progress Notes (Signed)
Physical Therapy Treatment Patient Details Name: Fernando Lane MRN: [id number] DOB: [birth year] Today's Date: 08/17/2013    History of Present Illness 63 yo male with Rt ichemic Limb and Nstemi    PT Comments    Pt admitted with above. Pt currently with functional limitations due to balance and endurance deficits.  Pt will benefit from skilled PT to increase their independence and safety with mobility to allow discharge to the venue listed below.    Follow Up Recommendations  SNF;Supervision/Assistance - 24 hour     Equipment Recommendations  Rolling walker with 5" wheels;3in1 (PT)    Recommendations for Other Services       Precautions / Restrictions Precautions Precautions: Fall Precaution Comments: rt foot x2 ulcers Restrictions Weight Bearing Restrictions: No    Mobility  Bed Mobility Overal bed mobility: Needs Assistance Bed Mobility: Supine to Sit     Supine to sit: Min assist;Mod assist Sit to supine: Min assist   General bed mobility comments: Needs assist for technique and assist for elevation of trunk.   Transfers Overall transfer level: Needs assistance Equipment used:  Charlaine Dalton) Transfers: Sit to/from Stand Sit to Stand: Mod assist;+2 physical assistance;From elevated surface         General transfer comment: Pt needed cues for hand placement.  Pt stood up and placed Stedy seat under pt and pt could not tolerate sitting on it.  [facility] and noted that pt had nystagmus.  Assisted pt back to supine and did vertigo testing and suspicious of right BPPV cupulolithiasis but could not perform repositioning due to pt with previous neck surgery.  Assisted pt back comfortable in bed as he is leaving to go to facility this pm.    Ambulation/Gait                 Stairs            Wheelchair Mobility    Modified Rankin (Stroke Patients Only)       Balance Overall balance assessment: Needs assistance;History of Falls Sitting-balance support:  Bilateral upper extremity supported;Feet supported Sitting balance-Leahy Scale: Fair     Standing balance support: Bilateral upper extremity supported;During functional activity Standing balance-Leahy Scale: Poor Standing balance comment: Pt requires UE support for balance.                      Cognition Arousal/Alertness: Awake/alert Behavior During Therapy: WFL for tasks assessed/performed Overall Cognitive Status: Impaired/Different from baseline Area of Impairment: Safety/judgement     Memory: Decreased short-term memory   Safety/Judgement: Decreased awareness of deficits          Exercises General Exercises - Lower Extremity Ankle Circles/Pumps: AROM;Both;10 reps;Seated Long Arc Quad: AROM;Both;5 reps;Seated    General Comments        Pertinent Vitals/Pain VSS, Soreness bil LEs Right > Left LE    Home Living                      Prior Function            PT Goals (current goals can now be found in the care plan section) Progress towards PT goals: Progressing toward goals    Frequency  Min 3X/week    PT Plan Discharge plan needs to be updated    Co-evaluation             End of Session Equipment Utilized During Treatment: Gait belt Activity Tolerance: Patient limited by pain;Patient limited by  fatigue Patient left: in bed;with call bell/phone within reach     Time: 1402-1426 PT Time Calculation (min): 24 min  Charges:  $Therapeutic Activity: 8-22 mins $[place] Rep Proc: 8-22 mins                    G Codes:      Christianne Dolin 09-09-13, 4:20 PM [facility] [phone] (pager)

## 2013-08-17 NOTE — Progress Notes (Signed)
OT Cancellation Note  Patient Details Name: SKILER OLDEN MRN: [id number] DOB: 1951-01-24   Cancelled Treatment:    Reason Eval/Treat Not Completed: Patient declined, no reason specified (reports just got back in bed) Ot to reattempt this afternoon  Peri Maris Pager: [phone]  08/17/2013, 11:21 AM
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
## 2013-08-17 NOTE — Progress Notes (Signed)
Pt d/c to SNF.  Alert and oriented x4.  Pt given oral analgesic prior to transport due to ongoing pain.  Pt transported with education on medications, diet, activity and follow-up care and instructions.  IV D/c'd

## 2013-08-20 ENCOUNTER — Telehealth: Payer: Self-pay | Admitting: Internal Medicine

## 2013-08-20 NOTE — Telephone Encounter (Signed)
lmonvm for pt re appts for 7/16 and mailed schedule.

## 2013-08-21 ENCOUNTER — Encounter (HOSPITAL_COMMUNITY): Payer: Medicare Other

## 2013-08-23 ENCOUNTER — Emergency Department (HOSPITAL_COMMUNITY): Payer: Medicare Other

## 2013-08-23 ENCOUNTER — Other Ambulatory Visit: Payer: Self-pay

## 2013-08-23 ENCOUNTER — Inpatient Hospital Stay (HOSPITAL_COMMUNITY): Payer: Medicare Other

## 2013-08-23 ENCOUNTER — Ambulatory Visit: Payer: Medicare Other | Admitting: Internal Medicine

## 2013-08-23 ENCOUNTER — Inpatient Hospital Stay (HOSPITAL_COMMUNITY)
Admission: EM | Admit: 2013-08-23 | Discharge: 2013-08-30 | DRG: 871 | Disposition: A | Discharge status: Skilled Nursing Facility | Payer: Medicare Other | Attending: Internal Medicine | Admitting: Internal Medicine

## 2013-08-23 ENCOUNTER — Encounter (HOSPITAL_COMMUNITY): Payer: Self-pay | Admitting: Emergency Medicine

## 2013-08-23 ENCOUNTER — Encounter (HOSPITAL_COMMUNITY): Payer: Self-pay

## 2013-08-23 ENCOUNTER — Ambulatory Visit (HOSPITAL_BASED_OUTPATIENT_CLINIC_OR_DEPARTMENT_OTHER)
Admission: RE | Admit: 2013-08-23 | Discharge: 2013-08-23 | Disposition: A | Discharge status: Home / Self Care | Payer: Medicare Other | Source: Ambulatory Visit | Attending: Internal Medicine | Admitting: Internal Medicine

## 2013-08-23 VITALS — BP 78/62 | HR 72

## 2013-08-23 DIAGNOSIS — G8929 Other chronic pain: Secondary | ICD-10-CM | POA: Diagnosis present

## 2013-08-23 DIAGNOSIS — M545 Low back pain, unspecified: Secondary | ICD-10-CM | POA: Diagnosis present

## 2013-08-23 DIAGNOSIS — I129 Hypertensive chronic kidney disease with stage 1 through stage 4 chronic kidney disease, or unspecified chronic kidney disease: Secondary | ICD-10-CM | POA: Diagnosis present

## 2013-08-23 DIAGNOSIS — A498 Other bacterial infections of unspecified site: Secondary | ICD-10-CM | POA: Diagnosis present

## 2013-08-23 DIAGNOSIS — Z96659 Presence of unspecified artificial knee joint: Secondary | ICD-10-CM

## 2013-08-23 DIAGNOSIS — D473 Essential (hemorrhagic) thrombocythemia: Secondary | ICD-10-CM | POA: Diagnosis present

## 2013-08-23 DIAGNOSIS — I213 ST elevation (STEMI) myocardial infarction of unspecified site: Secondary | ICD-10-CM

## 2013-08-23 DIAGNOSIS — D735 Infarction of spleen: Secondary | ICD-10-CM | POA: Diagnosis present

## 2013-08-23 DIAGNOSIS — T502X5A Adverse effect of carbonic-anhydrase inhibitors, benzothiadiazides and other diuretics, initial encounter: Secondary | ICD-10-CM | POA: Diagnosis not present

## 2013-08-23 DIAGNOSIS — S36029A Unspecified contusion of spleen, initial encounter: Secondary | ICD-10-CM | POA: Diagnosis present

## 2013-08-23 DIAGNOSIS — S3600XA Unspecified injury of spleen, initial encounter: Secondary | ICD-10-CM

## 2013-08-23 DIAGNOSIS — I319 Disease of pericardium, unspecified: Secondary | ICD-10-CM | POA: Diagnosis present

## 2013-08-23 DIAGNOSIS — N12 Tubulo-interstitial nephritis, not specified as acute or chronic: Secondary | ICD-10-CM | POA: Diagnosis present

## 2013-08-23 DIAGNOSIS — G9341 Metabolic encephalopathy: Secondary | ICD-10-CM | POA: Diagnosis present

## 2013-08-23 DIAGNOSIS — I959 Hypotension, unspecified: Secondary | ICD-10-CM

## 2013-08-23 DIAGNOSIS — IMO0002 Reserved for concepts with insufficient information to code with codable children: Secondary | ICD-10-CM

## 2013-08-23 DIAGNOSIS — Y9210 Unspecified residential institution as the place of occurrence of the external cause: Secondary | ICD-10-CM | POA: Diagnosis present

## 2013-08-23 DIAGNOSIS — I5023 Acute on chronic systolic (congestive) heart failure: Secondary | ICD-10-CM | POA: Diagnosis present

## 2013-08-23 DIAGNOSIS — R296 Repeated falls: Secondary | ICD-10-CM | POA: Diagnosis not present

## 2013-08-23 DIAGNOSIS — I5022 Chronic systolic (congestive) heart failure: Secondary | ICD-10-CM

## 2013-08-23 DIAGNOSIS — S301XXA Contusion of abdominal wall, initial encounter: Secondary | ICD-10-CM | POA: Diagnosis present

## 2013-08-23 DIAGNOSIS — I2510 Atherosclerotic heart disease of native coronary artery without angina pectoris: Secondary | ICD-10-CM

## 2013-08-23 DIAGNOSIS — E860 Dehydration: Secondary | ICD-10-CM | POA: Diagnosis present

## 2013-08-23 DIAGNOSIS — Z0289 Encounter for other administrative examinations: Secondary | ICD-10-CM

## 2013-08-23 DIAGNOSIS — L97809 Non-pressure chronic ulcer of other part of unspecified lower leg with unspecified severity: Secondary | ICD-10-CM | POA: Diagnosis present

## 2013-08-23 DIAGNOSIS — G589 Mononeuropathy, unspecified: Secondary | ICD-10-CM | POA: Diagnosis present

## 2013-08-23 DIAGNOSIS — W19XXXA Unspecified fall, initial encounter: Secondary | ICD-10-CM | POA: Diagnosis present

## 2013-08-23 DIAGNOSIS — G4733 Obstructive sleep apnea (adult) (pediatric): Secondary | ICD-10-CM | POA: Diagnosis present

## 2013-08-23 DIAGNOSIS — J449 Chronic obstructive pulmonary disease, unspecified: Secondary | ICD-10-CM | POA: Diagnosis present

## 2013-08-23 DIAGNOSIS — Z7982 Long term (current) use of aspirin: Secondary | ICD-10-CM

## 2013-08-23 DIAGNOSIS — S2239XA Fracture of one rib, unspecified side, initial encounter for closed fracture: Secondary | ICD-10-CM

## 2013-08-23 DIAGNOSIS — N179 Acute kidney failure, unspecified: Secondary | ICD-10-CM | POA: Diagnosis present

## 2013-08-23 DIAGNOSIS — L97909 Non-pressure chronic ulcer of unspecified part of unspecified lower leg with unspecified severity: Secondary | ICD-10-CM | POA: Diagnosis present

## 2013-08-23 DIAGNOSIS — R6520 Severe sepsis without septic shock: Secondary | ICD-10-CM

## 2013-08-23 DIAGNOSIS — I10 Essential (primary) hypertension: Secondary | ICD-10-CM

## 2013-08-23 DIAGNOSIS — E785 Hyperlipidemia, unspecified: Secondary | ICD-10-CM | POA: Diagnosis present

## 2013-08-23 DIAGNOSIS — I2589 Other forms of chronic ischemic heart disease: Secondary | ICD-10-CM | POA: Diagnosis present

## 2013-08-23 DIAGNOSIS — D751 Secondary polycythemia: Secondary | ICD-10-CM | POA: Diagnosis present

## 2013-08-23 DIAGNOSIS — A419 Sepsis, unspecified organism: Principal | ICD-10-CM | POA: Diagnosis present

## 2013-08-23 DIAGNOSIS — F32A Depression, unspecified: Secondary | ICD-10-CM

## 2013-08-23 DIAGNOSIS — E875 Hyperkalemia: Secondary | ICD-10-CM | POA: Diagnosis not present

## 2013-08-23 DIAGNOSIS — I2109 ST elevation (STEMI) myocardial infarction involving other coronary artery of anterior wall: Secondary | ICD-10-CM | POA: Diagnosis present

## 2013-08-23 DIAGNOSIS — J4489 Other specified chronic obstructive pulmonary disease: Secondary | ICD-10-CM | POA: Diagnosis present

## 2013-08-23 DIAGNOSIS — Z87891 Personal history of nicotine dependence: Secondary | ICD-10-CM

## 2013-08-23 DIAGNOSIS — N390 Urinary tract infection, site not specified: Secondary | ICD-10-CM

## 2013-08-23 DIAGNOSIS — I739 Peripheral vascular disease, unspecified: Secondary | ICD-10-CM | POA: Diagnosis present

## 2013-08-23 DIAGNOSIS — Z981 Arthrodesis status: Secondary | ICD-10-CM

## 2013-08-23 DIAGNOSIS — I2119 ST elevation (STEMI) myocardial infarction involving other coronary artery of inferior wall: Secondary | ICD-10-CM | POA: Diagnosis present

## 2013-08-23 DIAGNOSIS — S3011XA Contusion of abdominal wall, initial encounter: Secondary | ICD-10-CM | POA: Diagnosis present

## 2013-08-23 DIAGNOSIS — N189 Chronic kidney disease, unspecified: Secondary | ICD-10-CM | POA: Diagnosis present

## 2013-08-23 DIAGNOSIS — Z79899 Other long term (current) drug therapy: Secondary | ICD-10-CM

## 2013-08-23 DIAGNOSIS — F329 Major depressive disorder, single episode, unspecified: Secondary | ICD-10-CM

## 2013-08-23 DIAGNOSIS — I998 Other disorder of circulatory system: Secondary | ICD-10-CM | POA: Diagnosis present

## 2013-08-23 DIAGNOSIS — I509 Heart failure, unspecified: Secondary | ICD-10-CM | POA: Diagnosis present

## 2013-08-23 DIAGNOSIS — L98499 Non-pressure chronic ulcer of skin of other sites with unspecified severity: Secondary | ICD-10-CM | POA: Diagnosis present

## 2013-08-23 DIAGNOSIS — N529 Male erectile dysfunction, unspecified: Secondary | ICD-10-CM

## 2013-08-23 DIAGNOSIS — N170 Acute kidney failure with tubular necrosis: Secondary | ICD-10-CM | POA: Diagnosis not present

## 2013-08-23 DIAGNOSIS — D45 Polycythemia vera: Secondary | ICD-10-CM | POA: Diagnosis present

## 2013-08-23 LAB — CBC WITH DIFFERENTIAL/PLATELET
Basophils Absolute: 0 10*3/uL (ref 0.0–0.1)
Basophils Relative: 0 % (ref 0–1)
Eosinophils Absolute: 0.5 10*3/uL (ref 0.0–0.7)
Eosinophils Relative: 3 % (ref 0–5)
HCT: 44.6 % (ref 39.0–52.0)
Hemoglobin: 14.2 g/dL (ref 13.0–17.0)
LYMPHS ABS: 1.4 10*3/uL (ref 0.7–4.0)
Lymphocytes Relative: 8 % — ABNORMAL LOW (ref 12–46)
MCH: 27.4 pg (ref 26.0–34.0)
MCHC: 31.8 g/dL (ref 30.0–36.0)
MCV: 85.9 fL (ref 78.0–100.0)
Monocytes Absolute: 0.5 10*3/uL (ref 0.1–1.0)
Monocytes Relative: 3 % (ref 3–12)
NEUTROS ABS: 15.7 10*3/uL — AB (ref 1.7–7.7)
NEUTROS PCT: 86 % — AB (ref 43–77)
Platelets: 759 10*3/uL — ABNORMAL HIGH (ref 150–400)
RBC: 5.19 MIL/uL (ref 4.22–5.81)
RDW: 19.6 % — ABNORMAL HIGH (ref 11.5–15.5)
WBC: 18.1 10*3/uL — ABNORMAL HIGH (ref 4.0–10.5)

## 2013-08-23 LAB — COMPREHENSIVE METABOLIC PANEL
ALK PHOS: 204 U/L — AB (ref 39–117)
ALT: 46 U/L (ref 0–53)
AST: 34 U/L (ref 0–37)
Albumin: 2.8 g/dL — ABNORMAL LOW (ref 3.5–5.2)
BUN: 79 mg/dL — ABNORMAL HIGH (ref 6–23)
CHLORIDE: 93 meq/L — AB (ref 96–112)
CO2: 24 meq/L (ref 19–32)
Calcium: 9.1 mg/dL (ref 8.4–10.5)
Creatinine, Ser: 1.58 mg/dL — ABNORMAL HIGH (ref 0.50–1.35)
GFR calc Af Amer: 52 mL/min — ABNORMAL LOW (ref >90)
GFR, EST NON AFRICAN AMERICAN: 45 mL/min — AB (ref >90)
GLUCOSE: 104 mg/dL — AB (ref 70–99)
POTASSIUM: 5.4 meq/L — AB (ref 3.7–5.3)
SODIUM: 131 meq/L — AB (ref 137–147)
Total Bilirubin: 0.9 mg/dL (ref 0.3–1.2)
Total Protein: 6.7 g/dL (ref 6.0–8.3)

## 2013-08-23 LAB — URINALYSIS, ROUTINE W REFLEX MICROSCOPIC
BILIRUBIN URINE: NEGATIVE
GLUCOSE, UA: NEGATIVE mg/dL
Ketones, ur: NEGATIVE mg/dL
Nitrite: NEGATIVE
PROTEIN: 30 mg/dL — AB
Specific Gravity, Urine: 1.017 (ref 1.005–1.030)
UROBILINOGEN UA: 1 mg/dL (ref 0.0–1.0)
pH: 5 (ref 5.0–8.0)

## 2013-08-23 LAB — CBG MONITORING, ED: Glucose-Capillary: 94 mg/dL (ref 70–99)

## 2013-08-23 LAB — PROTIME-INR
INR: 1.24 (ref 0.00–1.49)
Prothrombin Time: 15.3 seconds — ABNORMAL HIGH (ref 11.6–15.2)

## 2013-08-23 LAB — TROPONIN I: Troponin I: <0.3 ng/mL (ref <0.30)

## 2013-08-23 LAB — GLUCOSE, CAPILLARY: Glucose-Capillary: 108 mg/dL — ABNORMAL HIGH (ref 70–99)

## 2013-08-23 LAB — URINE MICROSCOPIC-ADD ON

## 2013-08-23 LAB — I-STAT TROPONIN, ED: Troponin i, poc: 0.2 ng/mL (ref 0.00–0.08)

## 2013-08-23 LAB — I-STAT CG4 LACTIC ACID, ED: Lactic Acid, Venous: 0.92 mmol/L (ref 0.5–2.2)

## 2013-08-23 LAB — PRO B NATRIURETIC PEPTIDE: Pro B Natriuretic peptide (BNP): 9383 pg/mL — ABNORMAL HIGH (ref 0–125)

## 2013-08-23 MED ORDER — ATORVASTATIN CALCIUM 80 MG PO TABS
80.0000 mg | ORAL_TABLET | Freq: Every day | ORAL | Status: DC
  Administered 2013-08-24 – 2013-08-29 (×6): 80 mg via ORAL
  Filled 2013-08-23 (×7): qty 1

## 2013-08-23 MED ORDER — ACETAMINOPHEN 325 MG PO TABS
650.0000 mg | ORAL_TABLET | Freq: Four times a day (QID) | ORAL | Status: DC | PRN
  Administered 2013-08-24: 650 mg via ORAL
  Filled 2013-08-23: qty 2

## 2013-08-23 MED ORDER — SODIUM CHLORIDE 0.9 % IJ SOLN
3.0000 mL | Freq: Two times a day (BID) | INTRAMUSCULAR | Status: DC
  Administered 2013-08-24 – 2013-08-30 (×13): 3 mL via INTRAVENOUS

## 2013-08-23 MED ORDER — SODIUM CHLORIDE 0.9 % IV BOLUS (SEPSIS)
500.0000 mL | Freq: Once | INTRAVENOUS | Status: AC
  Administered 2013-08-24: 500 mL via INTRAVENOUS

## 2013-08-23 MED ORDER — ONDANSETRON HCL 4 MG PO TABS: 4.0000 mg | ORAL_TABLET | Freq: Four times a day (QID) | ORAL | Status: DC | PRN

## 2013-08-23 MED ORDER — DEXTROSE 5 % IV SOLN
1.0000 g | Freq: Every day | INTRAVENOUS | Status: DC
  Administered 2013-08-24 – 2013-08-28 (×5): 1 g via INTRAVENOUS
  Filled 2013-08-23 (×8): qty 10

## 2013-08-23 MED ORDER — SODIUM CHLORIDE 0.9 % IV SOLN
INTRAVENOUS | Status: DC
  Administered 2013-08-23: 18:00:00 via INTRAVENOUS

## 2013-08-23 MED ORDER — IOHEXOL 300 MG/ML  SOLN
100.0000 mL | Freq: Once | INTRAMUSCULAR | Status: AC | PRN
  Administered 2013-08-23: 100 mL via INTRAVENOUS

## 2013-08-23 MED ORDER — SODIUM CHLORIDE 0.9 % IV SOLN: INTRAVENOUS | Status: DC

## 2013-08-23 MED ORDER — ACETAMINOPHEN 650 MG RE SUPP: 650.0000 mg | Freq: Four times a day (QID) | RECTAL | Status: DC | PRN

## 2013-08-23 MED ORDER — SODIUM CHLORIDE 0.9 % IV BOLUS (SEPSIS)
250.0000 mL | Freq: Once | INTRAVENOUS | Status: AC
  Administered 2013-08-23: 250 mL via INTRAVENOUS

## 2013-08-23 MED ORDER — CLOPIDOGREL BISULFATE 75 MG PO TABS
75.0000 mg | ORAL_TABLET | Freq: Every day | ORAL | Status: DC
  Administered 2013-08-24 – 2013-08-29 (×6): 75 mg via ORAL
  Filled 2013-08-23 (×8): qty 1

## 2013-08-23 MED ORDER — DEXTROSE 5 % IV SOLN
1.0000 g | Freq: Once | INTRAVENOUS | Status: AC
  Administered 2013-08-23: 1 g via INTRAVENOUS
  Filled 2013-08-23: qty 10

## 2013-08-23 MED ORDER — GABAPENTIN 300 MG PO CAPS
300.0000 mg | ORAL_CAPSULE | Freq: Three times a day (TID) | ORAL | Status: DC
  Administered 2013-08-24 (×2): 300 mg via ORAL
  Filled 2013-08-23 (×4): qty 1

## 2013-08-23 MED ORDER — ONDANSETRON HCL 4 MG/2ML IJ SOLN: 4.0000 mg | Freq: Four times a day (QID) | INTRAMUSCULAR | Status: DC | PRN

## 2013-08-23 MED ORDER — VITAMIN B-1 100 MG PO TABS
100.0000 mg | ORAL_TABLET | Freq: Every day | ORAL | Status: DC
  Administered 2013-08-24 – 2013-08-30 (×7): 100 mg via ORAL
  Filled 2013-08-23 (×7): qty 1

## 2013-08-23 MED ORDER — FOLIC ACID 1 MG PO TABS
1.0000 mg | ORAL_TABLET | Freq: Every day | ORAL | Status: DC
  Administered 2013-08-24 – 2013-08-30 (×7): 1 mg via ORAL
  Filled 2013-08-23 (×7): qty 1

## 2013-08-23 MED ORDER — OXYCODONE-ACETAMINOPHEN 5-325 MG PO TABS
1.0000 | ORAL_TABLET | ORAL | Status: DC | PRN
  Administered 2013-08-24 – 2013-08-30 (×14): 2 via ORAL
  Filled 2013-08-23 (×14): qty 2

## 2013-08-23 MED ORDER — SODIUM CHLORIDE 0.9 % IV SOLN
1.0000 g | Freq: Once | INTRAVENOUS | Status: AC
  Administered 2013-08-23: 1 g via INTRAVENOUS
  Filled 2013-08-23: qty 10

## 2013-08-23 MED ORDER — COLCHICINE 0.6 MG PO TABS
0.6000 mg | ORAL_TABLET | Freq: Every day | ORAL | Status: DC
  Administered 2013-08-24: 0.6 mg via ORAL
  Filled 2013-08-23: qty 1

## 2013-08-23 MED ORDER — ASPIRIN 81 MG PO CHEW
81.0000 mg | CHEWABLE_TABLET | Freq: Every day | ORAL | Status: DC
  Administered 2013-08-24 – 2013-08-30 (×7): 81 mg via ORAL
  Filled 2013-08-23 (×7): qty 1

## 2013-08-23 NOTE — Consult Note (Signed)
Reason for Consult:splenic contusions  Referring Physician: Dr. Amado Nash is an 63 y.o. male.  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 He was seen in CHF clinic today and found to be hypotensive, thought to be due to overdiuresis. He was sent to the emergency room for evaluation. He has been doing physical therapy at skilled nursing facility. He reports falling down between his bed and a dresser. He complains of some mild left rib pain. He also notes he has an abdominal contusion do to the physical therapy belt. CT scan the emergency department demonstrated splenomegaly with some splenic contusions. I was asked to see him in consultation regarding management of this.  Past Medical History  Diagnosis Date  . Chronic low back pain   . 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 Chronic neck pain   . OSA (obstructive sleep apnea)     CPAP @ bedtime  . Allergy   . Unspecified essential hypertension     Resistant, 2D Echo - [id number]  . Polycythemia   . CAD (coronary artery disease)     Vessel type unspecified  . PVD (peripheral vascular disease)   . DDD (degenerative disc disease), cervical     Past Surgical History  Procedure Laterality Date  . Lower extrmity doppler      [place] 2008; no evidence for PAD  . US echocardiography  11/2008    Normal valves, mild LAE  . Left knee replacement  10/2006  . Cervical spine surgery  02/2008  . Joint replacement    . Spine surgery      Family History  Problem Relation Age of Onset  . Heart attack Father     Social History:  reports that he quit smoking about 45 years ago. His smoking use included Cigarettes and Pipe. He smoked 0.00 packs per day for 0 years. He has quit using smokeless tobacco. He reports that he drinks about 3.6 ounces of alcohol per week. He reports that he does not use illicit drugs.  Allergies: No Known Allergies  Medications:  Prior to Admission:    Prescriptions prior to admission  Medication Sig Dispense Refill  . aspirin 81 MG chewable tablet Chew 1 tablet (81 mg total) by mouth daily.      Marland Kitchen atorvastatin (LIPITOR) 80 MG tablet Take 1 tablet (80 mg total) by mouth daily at 6 PM.      . benzonatate (TESSALON) 100 MG capsule Take 1 capsule (100 mg total) by mouth 3 (three) times daily as needed for cough.  20 capsule  0  . carvedilol (COREG) 12.5 MG tablet Take 1 tablet (12.5 mg total) by mouth 2 (two) times daily with a meal.      . chlorhexidine (PERIDEX) 0.12 % solution 15 mLs by Mouth Rinse route 2 (two) times daily.  120 mL  0  . chlorpheniramine-HYDROcodone (TUSSIONEX) 10-8 MG/5ML LQCR Take 5 mLs by mouth every 12 (twelve) hours as needed for cough.    0  . clopidogrel (PLAVIX) 75 MG tablet Take 1 tablet (75 mg total) by mouth daily with breakfast.      . colchicine 0.6 MG tablet Take 1 tablet (0.6 mg total) by mouth daily.      . collagenase (SANTYL) ointment Apply topically daily.  15 g  0  . feeding supplement, ENSURE COMPLETE, (ENSURE COMPLETE) LIQD Take 237 mLs by mouth 2 (two) times daily between meals.      Marland Kitchen  folic acid (FOLVITE) 1 MG tablet Take 1 tablet (1 mg total) by mouth daily.      . furosemide (LASIX) 40 MG tablet Take 1 tablet (40 mg total) by mouth 2 (two) times daily.  30 tablet    . gabapentin (NEURONTIN) 300 MG capsule Take 1 capsule (300 mg total) by mouth 3 (three) times daily.  90 capsule  3  . lisinopril (PRINIVIL,ZESTRIL) 5 MG tablet Take 1 tablet (5 mg total) by mouth daily.      . Multiple Vitamin (MULTIVITAMIN WITH MINERALS) TABS tablet Take 1 tablet by mouth daily.      Marland Kitchen oxyCODONE-acetaminophen (PERCOCET/ROXICET) 5-325 MG per tablet Take 1-2 tablets by mouth every 4 (four) hours as needed for severe pain.  30 tablet  0  . spironolactone (ALDACTONE) 12.5 mg TABS tablet Take 0.5 tablets (12.5 mg total) by mouth daily.      Marland Kitchen thiamine 100 MG tablet Take 1 tablet (100 mg total) by mouth daily.         Results for orders placed during the hospital encounter of 08/23/13 (from the past 48 hour(s))  CBG MONITORING, ED     Status: None   Collection Time    08/23/13  5:48 PM      Result Value Ref Range   Glucose-Capillary 94  70 - 99 mg/dL   Comment 1 Documented in Chart     Comment 2 Notify RN    CBC WITH DIFFERENTIAL     Status: Abnormal   Collection Time    08/23/13  5:58 PM      Result Value Ref Range   WBC 18.1 (*) 4.0 - 10.5 K/uL   RBC 5.19  4.22 - 5.81 MIL/uL   Hemoglobin 14.2  13.0 - 17.0 g/dL   HCT 44.6  39.0 - 52.0 %   MCV 85.9  78.0 - 100.0 fL   MCH 27.4  26.0 - 34.0 pg   MCHC 31.8  30.0 - 36.0 g/dL   RDW 19.6 (*) 11.5 - 15.5 %   Platelets 759 (*) 150 - 400 K/uL   Neutrophils Relative % 86 (*) 43 - 77 %   Neutro Abs 15.7 (*) 1.7 - 7.7 K/uL   Lymphocytes Relative 8 (*) 12 - 46 %   Lymphs Abs 1.4  0.7 - 4.0 K/uL   Monocytes Relative 3  3 - 12 %   Monocytes Absolute 0.5  0.1 - 1.0 K/uL   Eosinophils Relative 3  0 - 5 %   Eosinophils Absolute 0.5  0.0 - 0.7 K/uL   Basophils Relative 0  0 - 1 %   Basophils Absolute 0.0  0.0 - 0.1 K/uL  COMPREHENSIVE METABOLIC PANEL     Status: Abnormal   Collection Time    08/23/13  5:58 PM      Result Value Ref Range   Sodium 131 (*) 137 - 147 mEq/L   Potassium 5.4 (*) 3.7 - 5.3 mEq/L   Chloride 93 (*) 96 - 112 mEq/L   CO2 24  19 - 32 mEq/L   Glucose, Bld 104 (*) 70 - 99 mg/dL   BUN 79 (*) 6 - 23 mg/dL   Creatinine, Ser 1.58 (*) 0.50 - 1.35 mg/dL   Calcium 9.1  8.4 - 10.5 mg/dL   Total Protein 6.7  6.0 - 8.3 g/dL   Albumin 2.8 (*) 3.5 - 5.2 g/dL   AST 34  0 - 37 U/L   ALT 46  0 - 53  U/L   Alkaline Phosphatase 204 (*) 39 - 117 U/L   Total Bilirubin 0.9  0.3 - 1.2 mg/dL   GFR calc non Af Amer 45 (*) >90 mL/min   GFR calc Af Amer 52 (*) >90 mL/min   Comment: (NOTE)     The eGFR has been calculated using the CKD EPI equation.     This calculation has not been validated in all clinical situations.     eGFR's persistently <90  mL/min signify possible Chronic Kidney     Disease.  PROTIME-INR     Status: Abnormal   Collection Time    08/23/13  5:58 PM      Result Value Ref Range   Prothrombin Time 15.3 (*) 11.6 - 15.2 seconds   INR 1.24  0.00 - 1.49  PRO B NATRIURETIC PEPTIDE     Status: Abnormal   Collection Time    08/23/13  5:59 PM      Result Value Ref Range   Pro B Natriuretic peptide (BNP) 9383.0 (*) 0 - 125 pg/mL  I-STAT TROPOININ, ED     Status: Abnormal   Collection Time    08/23/13  6:03 PM      Result Value Ref Range   Troponin i, poc 0.20 (*) 0.00 - 0.08 ng/mL   Comment NOTIFIED PHYSICIAN     Comment 3            Comment: Due to the release kinetics of cTnI,     a negative result within the first hours     of the onset of symptoms does not rule out     myocardial infarction with certainty.     If myocardial infarction is still suspected,     repeat the test at appropriate intervals.  I-STAT CG4 LACTIC ACID, ED     Status: None   Collection Time    08/23/13  6:06 PM      Result Value Ref Range   Lactic Acid, Venous 0.92  0.5 - 2.2 mmol/L  TROPONIN I     Status: None   Collection Time    08/23/13  6:19 PM      Result Value Ref Range   Troponin I <0.30  <0.30 ng/mL   Comment:            Due to the release kinetics of cTnI,     a negative result within the first hours     of the onset of symptoms does not rule out     myocardial infarction with certainty.     If myocardial infarction is still suspected,     repeat the test at appropriate intervals.  URINALYSIS, ROUTINE W REFLEX MICROSCOPIC     Status: Abnormal   Collection Time    08/23/13  6:35 PM      Result Value Ref Range   Color, Urine YELLOW  YELLOW   APPearance TURBID (*) CLEAR   Specific Gravity, Urine 1.017  1.005 - 1.030   pH 5.0  5.0 - 8.0   Glucose, UA NEGATIVE  NEGATIVE mg/dL   Hgb urine dipstick LARGE (*) NEGATIVE   Bilirubin Urine NEGATIVE  NEGATIVE   Ketones, ur NEGATIVE  NEGATIVE mg/dL   Protein, ur 30 (*)  NEGATIVE mg/dL   Urobilinogen, UA 1.0  0.0 - 1.0 mg/dL   Nitrite NEGATIVE  NEGATIVE   Leukocytes, UA LARGE (*) NEGATIVE  URINE MICROSCOPIC-ADD ON     Status: Abnormal   Collection Time  08/23/13  6:35 PM      Result Value Ref Range   WBC, UA TOO NUMEROUS TO COUNT  <3 WBC/hpf   RBC / HPF 3-6  <3 RBC/hpf   Bacteria, UA MANY (*) RARE  GLUCOSE, CAPILLARY     Status: Abnormal   Collection Time    08/23/13 10:25 PM      Result Value Ref Range   Glucose-Capillary 108 (*) 70 - 99 mg/dL    Ct Abdomen Pelvis W Contrast  08/23/2013   CLINICAL DATA:  Fall during physical therapy today. Bruising around the abdomen.  EXAM: CT ABDOMEN AND PELVIS WITH CONTRAST  TECHNIQUE: Multidetector CT imaging of the abdomen and pelvis was performed using the standard protocol following bolus administration of intravenous contrast.  CONTRAST:  159m OMNIPAQUE IOHEXOL 300 MG/ML  SOLN  COMPARISON:  None.  FINDINGS: Bones: Nondisplaced left ninth lateral rib fracture is present. Congenitally narrow spinal canal. Moderate thoracolumbar spondylosis. Thoracolumbar vertebral body height is preserved. Pelvic rings appear intact. Both hips are located.  Lung Bases: Small right pleural effusion. Associated atelectasis. Coronary artery atherosclerosis.  Liver:  Liver appears within normal limits.  No perihepatic fluid.  Spleen: Splenomegaly is present with 17 cm splenic span. There are scattered areas of areas of low attenuation in the inferior spleen compatible with splenic contusion. There is no pseudoaneurysm or perisplenic blood the suggest laceration. Splenic infarct is considered less likely although it could have this appearance. The present 7 adjacent rib fracture suggest trauma as the mechanism.  Gallbladder:  Contracted.  Common bile duct:  Normal.  Pancreas:  Normal.  Adrenal glands: Small nodule adjacent to the left adrenal gland is most compatible with a splenuculus.  Kidneys: Nonspecific bilateral perinephric stranding. 1  cm left upper pole renal cysts. Both ureters appear within normal limits. There is no delayed excretion of contrast following a 3 minutes delay, which may be secondary to poor renal function or contrast induced nephropathy. 4 mm nonobstructing calculus in the left inferior renal pole.  Stomach:  Collapsed.  Small hiatal hernia.  Small bowel:  Normal.  Colon:   Colonic diverticulosis.  No diverticulitis.  Pelvic Genitourinary: Thickening of the urinary bladder the degree of distension, raising the possibility of chronic bladder outflow obstruction.  Vasculature: Atherosclerosis. No acute vascular abnormality. No extravasation of contrast.  Body Wall: Fat containing periumbilical hernia. Stranding along both flanks, greater on the right than left which may represent contusion or subcutaneous edema.  IMPRESSION: 1. Multiple areas of low attenuation in an enlarged spleen. These are compatible with splenic contusions. Splenic infarcts considered less likely. Nearby nondisplaced left lateral ninth rib fracture. No perisplenic fluid to suggest laceration and no extravasation of contrast. 2. Splenomegaly with 17 cm splenic span. 3. No delayed excretion of contrast from the kidneys suggesting contrast induced nephropathy. This could also be associated with chronic renal insufficiency. Recommend assessment of renal function. 4. Small right pleural effusion with associated atelectasis. 5. Stranding in the body wall along both flanks, right greater than left suggesting contusion.   Electronically Signed   By: GDereck LigasM.D.   On: 08/23/2013 20:31   Dg Chest Port 1 View  08/23/2013   CLINICAL DATA:  Hypotension.  EXAM: PORTABLE CHEST - 1 VIEW  COMPARISON:  08/23/2013.  02/21/2013.  FINDINGS: Cardiomegaly. No airspace disease or effusion. Basilar atelectasis. Apical lordotic projection. Lower cervical ACDF. Monitoring leads project over the chest.  IMPRESSION: Cardiomegaly without failure.   Electronically Signed   By:  Dereck Ligas M.D.   On: 08/23/2013 20:36   Dg Chest Port 1 View  08/23/2013   CLINICAL DATA:  Left-sided chest pain  EXAM: PORTABLE CHEST - 1 VIEW  COMPARISON:  08/15/2013  FINDINGS: Cardiac shadow remains enlarged. The lungs are clear. Postsurgical changes are noted in the cervical spine. No acute bony abnormality is noted.  IMPRESSION: No acute abnormality seen.  Stable cardiomegaly.   Electronically Signed   By: Inez Catalina M.D.   On: 08/23/2013 18:34    Review of Systems  Constitutional: Negative for fever and chills.  HENT: Negative.   Eyes: Negative.   Respiratory: Negative.   Cardiovascular: Positive for leg swelling. Negative for chest pain.  Gastrointestinal: Positive for abdominal pain.       See history of present illness  Genitourinary: Negative.   Musculoskeletal:       Bilateral lower extremity neuropathy  Skin: Negative.   Neurological:       See above  Endo/Heme/Allergies: Negative.   Psychiatric/Behavioral: Negative.    Blood pressure 90/56, pulse 42, temperature 98.6 F (37 C), temperature source Oral, resp. rate 18, SpO2 100.00%. Physical Exam  Constitutional: He is oriented to person, place, and time. He appears well-developed. No distress.  HENT:  Head: Normocephalic.  Mouth/Throat: Oropharynx is clear and moist.  Eyes: EOM are normal. Pupils are equal, round, and reactive to light. Right eye exhibits no discharge. Left eye exhibits no discharge.  Neck: Normal range of motion. Neck supple. No tracheal deviation present.  Cardiovascular: Normal rate and normal heart sounds.   Respiratory: Effort normal and breath sounds normal. No stridor. No respiratory distress. He has no wheezes. He has no rales.  GI: Soft. He exhibits no distension. There is tenderness. There is no rebound and no guarding.    Lower abdominal contusion, mild left upper quadrant tenderness without guarding, no generalized tenderness, left posterior rib tenderness  Musculoskeletal:    Small ulcers bilateral lower extremity  Neurological: He is alert and oriented to person, place, and time. He exhibits normal muscle tone.  Skin: Skin is warm.  Psychiatric: He has a normal mood and affect.    Assessment/Plan: Status post fall with left posterior rib fracture and splenic contusions. 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 There is no hemorrhage from these contusions. Recommend bedrest for tonight, no anticoagulation, followup hemoglobin in a.m. His hypotension seems cardiac in nature. We will follow with you. I discussed the plan with the patient.  Murphy Duzan E 08/23/2013, 11:40 PM

## 2013-08-23 NOTE — ED Notes (Signed)
This RN heard the pt making a gargling sound when he inhaled. This RN listened to the pt's lungs which were mostly clear with slight fine crackles. Rogene Houston, MD is aware.

## 2013-08-23 NOTE — H&P (Signed)
[facility] History and Physical  Fernando Lane JJO:[id number] DOB: 12-14-1950 DOA: 08/23/2013  Referring physician: ER physician. PCP: Gwendolyn Grant, MD   Chief Complaint: Low blood pressure.  HPI: Fernando Lane is a 63 y.o. male with history of polycythemia vera, recent MI had cardiac catheter and was found to have two-vessel disease with EF of 20% managed medically, recent pericardiocentesis after patient had peripheral effusion from pericarditis, right lower leg ischemia manage conservatively has recent mammogram showed blood flow and any worsening vascular surgery is planning amputation, OSA, hypertension, hyperlipidemia had gone for his regular followup to his cardiologist when patient was found to have low blood pressure in the systolic 16W and patient was transferred to [facility]. Patient is feeling weak and dizzy and did have a fall today at his nursing home and he was being transferred to his wheelchair. Patient did not lose consciousness or hit his head after his fall but had some bruise on his abdomen had CT abdomen and pelvis shows conclusions on his spleen rib fracture. Patient otherwise denies any chest pain or shortness of breath nausea vomiting abdominal pain diarrhea. Patient's blood pressure does improve with IV boluses. UA shows features concerning for UTI and was placed on antibiotics. Blood cultures obtained and patient will be admitted for further management.   Review of Systems: As presented in the history of presenting illness, rest negative.  Past Medical History  Diagnosis Date  . Chronic low back pain   . 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 Chronic neck pain   . OSA (obstructive sleep apnea)     CPAP @ bedtime  . Allergy   . Unspecified essential hypertension     Resistant, 2D Echo - EF-55-60  . Polycythemia   . CAD (coronary artery disease)     Vessel type unspecified  . PVD (peripheral  vascular disease)   . DDD (degenerative disc disease), cervical    Past Surgical History  Procedure Laterality Date  . Lower extrmity doppler      [place] 2008; no evidence for PAD  . US echocardiography  11/2008    Normal valves, mild LAE  . Left knee replacement  10/2006  . Cervical spine surgery  02/2008  . Joint replacement    . Spine surgery     Social History:  reports that he quit smoking about 45 years ago. His smoking use included Cigarettes and Pipe. He smoked 0.00 packs per day for 0 years. He has quit using smokeless tobacco. He reports that he drinks about 3.6 ounces of alcohol per week. He reports that he does not use illicit drugs. Where does patient live nursing home. Can patient participate in ADLs? Not sure.  No Known Allergies  Family History:  Family History  Problem Relation Age of Onset  . Heart attack Father       Prior to Admission medications   Medication Sig Start Date End Date Taking? Authorizing Provider  aspirin 81 MG chewable tablet Chew 1 tablet (81 mg total) by mouth daily. 08/17/13   Geradine Girt, DO  atorvastatin (LIPITOR) 80 MG tablet Take 1 tablet (80 mg total) by mouth daily at 6 PM. 08/17/13   Geradine Girt, DO  benzonatate (TESSALON) 100 MG capsule Take 1 capsule (100 mg total) by mouth 3 (three) times daily as needed for cough. 08/17/13   Geradine Girt, DO  carvedilol (COREG) 12.5 MG tablet Take 1 tablet (12.5 mg  total) by mouth 2 (two) times daily with a meal. 08/17/13   Geradine Girt, DO  chlorhexidine (PERIDEX) 0.12 % solution 15 mLs by Mouth Rinse route 2 (two) times daily. 08/17/13   Geradine Girt, DO  chlorpheniramine-HYDROcodone (TUSSIONEX) 10-8 MG/5ML LQCR Take 5 mLs by mouth every 12 (twelve) hours as needed for cough. 08/17/13   Geradine Girt, DO  clopidogrel (PLAVIX) 75 MG tablet Take 1 tablet (75 mg total) by mouth daily with breakfast. 08/17/13   Geradine Girt, DO  colchicine 0.6 MG tablet Take 1 tablet (0.6 mg total) by mouth daily. 08/17/13    Geradine Girt, DO  collagenase (SANTYL) ointment Apply topically daily. 08/17/13   Geradine Girt, DO  feeding supplement, ENSURE COMPLETE, (ENSURE COMPLETE) LIQD Take 237 mLs by mouth 2 (two) times daily between meals. 08/17/13   Geradine Girt, DO  folic acid (FOLVITE) 1 MG tablet Take 1 tablet (1 mg total) by mouth daily. 08/17/13   Geradine Girt, DO  furosemide (LASIX) 40 MG tablet Take 1 tablet (40 mg total) by mouth 2 (two) times daily. 08/17/13   Geradine Girt, DO  gabapentin (NEURONTIN) 300 MG capsule Take 1 capsule (300 mg total) by mouth 3 (three) times daily. 02/21/13   Rowe Clack, MD  lisinopril (PRINIVIL,ZESTRIL) 5 MG tablet Take 1 tablet (5 mg total) by mouth daily. 08/17/13   Geradine Girt, DO  Multiple Vitamin (MULTIVITAMIN WITH MINERALS) TABS tablet Take 1 tablet by mouth daily. 08/17/13   Geradine Girt, DO  oxyCODONE-acetaminophen (PERCOCET/ROXICET) 5-325 MG per tablet Take 1-2 tablets by mouth every 4 (four) hours as needed for severe pain. 08/17/13   Geradine Girt, DO  spironolactone (ALDACTONE) 12.5 mg TABS tablet Take 0.5 tablets (12.5 mg total) by mouth daily. 08/17/13   Geradine Girt, DO  thiamine 100 MG tablet Take 1 tablet (100 mg total) by mouth daily. 08/17/13   Geradine Girt, DO    Physical Exam: Filed Vitals:   08/23/13 2045 08/23/13 2100 08/23/13 2134 08/23/13 2228  BP: 96/55 94/58 90/56    Pulse: 51 42    Temp:   97.6 F (36.4 C) 98.6 F (37 C)  TempSrc:   Oral Oral  Resp: 16 24 18    SpO2:   100%      General:  Morbidly obese not in distress.  Eyes: Anicteric no pallor.  ENT: No discharge from ears eyes nose mouth.  Neck: No mass felt. JVD cannot be appreciated.  Cardiovascular: S1-S2 heard.  Respiratory: No rhonchi or crepitations.  Abdomen: Soft nontender bowel sounds present. Abdomen is distended and has bruise on the lower abdomen.  Skin: Patient has ulcers on his left knee and right leg which are chronic.  Musculoskeletal: Mild edema. Right  lower extremity has dark toes and is cold but patient able to move extremities. Patient has chronic ischemia of the right lower extremity.  Psychiatric: Appears normal.  Neurologic: Alert awake oriented to time place and person. Moves all extremities.  Labs on Admission:  Basic Metabolic Panel:  Recent Labs Lab 08/17/13 0410 08/23/13 1758  NA 140 131*  K 4.3 5.4*  CL 98 93*  CO2 32 24  GLUCOSE 102* 104*  BUN 31* 79*  CREATININE 0.95 1.58*  CALCIUM 9.5 9.1   Liver Function Tests:  Recent Labs Lab 08/23/13 1758  AST 34  ALT 46  ALKPHOS 204*  BILITOT 0.9  PROT 6.7  ALBUMIN 2.8*  No results found for this basename: LIPASE, AMYLASE,  in the last 168 hours No results found for this basename: AMMONIA,  in the last 168 hours CBC:  Recent Labs Lab 08/17/13 0410 08/23/13 1758  WBC 20.2* 18.1*  NEUTROABS  --  15.7*  HGB 16.2 14.2  HCT 52.7* 44.6  MCV 87.7 85.9  PLT 755* 759*   Cardiac Enzymes:  Recent Labs Lab 08/23/13 1819  TROPONINI <0.30    BNP (last 3 results)  Recent Labs  08/07/13 1600 08/23/13 1759  PROBNP 13375.0* 9383.0*   CBG:  Recent Labs Lab 08/23/13 1748 08/23/13 2225  GLUCAP 94 108*    Radiological Exams on Admission: Ct Abdomen Pelvis W Contrast  08/23/2013   CLINICAL DATA:  Fall during physical therapy today. Bruising around the abdomen.  EXAM: CT ABDOMEN AND PELVIS WITH CONTRAST  TECHNIQUE: Multidetector CT imaging of the abdomen and pelvis was performed using the standard protocol following bolus administration of intravenous contrast.  CONTRAST:  155mL OMNIPAQUE IOHEXOL 300 MG/ML  SOLN  COMPARISON:  None.  FINDINGS: Bones: Nondisplaced left ninth lateral rib fracture is present. Congenitally narrow spinal canal. Moderate thoracolumbar spondylosis. Thoracolumbar vertebral body height is preserved. Pelvic rings appear intact. Both hips are located.  Lung Bases: Small right pleural effusion. Associated atelectasis. Coronary artery  atherosclerosis.  Liver:  Liver appears within normal limits.  No perihepatic fluid.  Spleen: Splenomegaly is present with 17 cm splenic span. There are scattered areas of areas of low attenuation in the inferior spleen compatible with splenic contusion. There is no pseudoaneurysm or perisplenic blood the suggest laceration. Splenic infarct is considered less likely although it could have this appearance. The present 7 adjacent rib fracture suggest trauma as the mechanism.  Gallbladder:  Contracted.  Common bile duct:  Normal.  Pancreas:  Normal.  Adrenal glands: Small nodule adjacent to the left adrenal gland is most compatible with a splenuculus.  Kidneys: Nonspecific bilateral perinephric stranding. 1 cm left upper pole renal cysts. Both ureters appear within normal limits. There is no delayed excretion of contrast following a 3 minutes delay, which may be secondary to poor renal function or contrast induced nephropathy. 4 mm nonobstructing calculus in the left inferior renal pole.  Stomach:  Collapsed.  Small hiatal hernia.  Small bowel:  Normal.  Colon:   Colonic diverticulosis.  No diverticulitis.  Pelvic Genitourinary: Thickening of the urinary bladder the degree of distension, raising the possibility of chronic bladder outflow obstruction.  Vasculature: Atherosclerosis. No acute vascular abnormality. No extravasation of contrast.  Body Wall: Fat containing periumbilical hernia. Stranding along both flanks, greater on the right than left which may represent contusion or subcutaneous edema.  IMPRESSION: 1. Multiple areas of low attenuation in an enlarged spleen. These are compatible with splenic contusions. Splenic infarcts considered less likely. Nearby nondisplaced left lateral ninth rib fracture. No perisplenic fluid to suggest laceration and no extravasation of contrast. 2. Splenomegaly with 17 cm splenic span. 3. No delayed excretion of contrast from the kidneys suggesting contrast induced nephropathy.  This could also be associated with chronic renal insufficiency. Recommend assessment of renal function. 4. Small right pleural effusion with associated atelectasis. 5. Stranding in the body wall along both flanks, right greater than left suggesting contusion.   Electronically Signed   By: Dereck Ligas M.D.   On: 08/23/2013 20:31   Dg Chest Port 1 View  08/23/2013   CLINICAL DATA:  Hypotension.  EXAM: PORTABLE CHEST - 1 VIEW  COMPARISON:  08/23/2013.  02/21/2013.  FINDINGS: Cardiomegaly. No airspace disease or effusion. Basilar atelectasis. Apical lordotic projection. Lower cervical ACDF. Monitoring leads project over the chest.  IMPRESSION: Cardiomegaly without failure.   Electronically Signed   By: Dereck Ligas M.D.   On: 08/23/2013 20:36   Dg Chest Port 1 View  08/23/2013   CLINICAL DATA:  Left-sided chest pain  EXAM: PORTABLE CHEST - 1 VIEW  COMPARISON:  08/15/2013  FINDINGS: Cardiac shadow remains enlarged. The lungs are clear. Postsurgical changes are noted in the cervical spine. No acute bony abnormality is noted.  IMPRESSION: No acute abnormality seen.  Stable cardiomegaly.   Electronically Signed   By: Inez Catalina M.D.   On: 08/23/2013 18:34    EKG: Independently reviewed. Normal sinus rhythm with ST-T changes in inferior and anterior leads. Cardiology following.  Assessment/Plan Principal Problem:   Hypotension Active Problems:   OBSTRUCTIVE SLEEP APNEA   CORONARY ARTERY DISEASE, VESSEL TYPE UNSPECIFIED   Leg ulcer   Chronic systolic heart failure   UTI (lower urinary tract infection)   1. Hypotension - patient clinically does not look like to be in sepsis. Patient does have UTI for which we have ordered urine cultures and since patient is hypotensive with blood cultures and continue with as needed boluses of IV fluid. Hold antihypertensives and Lasix and spironolactone. Closely follow intake output and metabolic panel. 2. Acute renal failure - probably secondary to hypotension  and dehydration. Hold antihypertensives and diuretics. Patient so far has received 1 L normal saline bolus. Check FeNa. 3. UTI - follow urine cultures and on empiric antibiotics. 4. Splenic contusion after fall - I have consulted Dr. Grandville Silos of surgery. Dr. Grandville Silos has advised to hold off anticoagulation for now. Further recommendations per surgery. Follow CBC. 5. CAD - denies any chest pain. 6. Peripheral vascular disease with right lower extremity ischemia - has had a recent aortogram and had shown blood flow. Closely observe. See history of present illness. 7. Chronic systolic heart failure - presently holding off diuretics to 2 hypotension. 8. Polycythemia Vera 9. OSA on CPAP. 10. Chronic leg ulcers - wound team consult. 11. Hyperlipidemia - on statins.    Code Status: Full code.  Family Communication: Family at the bedside.  Disposition Plan: Admit to inpatient.    Alford Gamero N. [facility] Pager [phone].  If 7PM-7AM, please contact night-coverage [email or web address] Password [facility] 08/23/2013, 11:37 PM

## 2013-08-23 NOTE — Progress Notes (Signed)
PCP: Asa Lente Cardiology: Aundra Dubin  Weight Range   Baseline proBNP     HPI:  63 y/o with multiple medical problems including p.vera, COPD, PAD, CAD and systolic HF EF 96%    Has been undergoing treatment for BLE non-healing ulcers at [place].   Presented to ER 08/07/13 with evidence of out of hospital LAD infarct. Cardiac cath with severe 2 vessel occlusive dz with totally occlude mid-dist LAD and RCA with LVEF of 20%. Lcx 30% No targets for PCI/CABG. Echo EF 35-40% with severely dilated and hypokinetic RV. Hospital course c/b large pericardial effusion/tamponade requiring pericardiocentesis. Was also seen by [facility] and had aortogram which showed bilateral occluded PTs. No option for revascularization. Only option was amputation as needed.   Since discharge has been at [facility] at [facility]. Feels weak. Says he just hasn't recovered since hospitalization. Unable to walk due to pain in his R leg and foot. Gets around in a wheelchair. Doing PT. Occasional CP. Not severe. Feels SOB all the time. Says they are only weighing him only once per week. Mild edema. Feels dizzy. SBP 66 by Doppler in Clinic.  Past Medical History  Diagnosis Date  . Chronic low back pain   . 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 Chronic neck pain   . OSA (obstructive sleep apnea)     CPAP @ bedtime  . Allergy   . Unspecified essential hypertension     Resistant, 2D Echo - EF-55-60  . Polycythemia   . CAD (coronary artery disease)     Vessel type unspecified  . PVD (peripheral vascular disease)   . DDD (degenerative disc disease), cervical     Family History  Problem Relation Age of Onset  . Heart attack Father      Review of Systems  Constitutional: Positive for malaise/fatigue. Negative for fever and chills.  HENT: Negative for congestion.   Eyes: Negative for blurred vision.  Respiratory: Positive for shortness of breath. Negative for cough and sputum  production.   Cardiovascular: Positive for chest pain and leg swelling. Negative for palpitations.  Gastrointestinal: Negative for heartburn, nausea, vomiting and diarrhea.  Musculoskeletal: Positive for back pain and joint pain.  Skin: Negative for rash.  Neurological: Negative for seizures, loss of consciousness and headaches.     Past Medical History  Diagnosis Date  . Chronic low back pain   . 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 Chronic neck pain   . OSA (obstructive sleep apnea)     CPAP @ bedtime  . Allergy   . Unspecified essential hypertension     Resistant, 2D Echo - EF-55-60  . Polycythemia   . CAD (coronary artery disease)     Vessel type unspecified  . PVD (peripheral vascular disease)   . DDD (degenerative disc disease), cervical     Current Outpatient Prescriptions  Medication Sig Dispense Refill  . aspirin 81 MG chewable tablet Chew 1 tablet (81 mg total) by mouth daily.      Marland Kitchen atorvastatin (LIPITOR) 80 MG tablet Take 1 tablet (80 mg total) by mouth daily at 6 PM.      . benzonatate (TESSALON) 100 MG capsule Take 1 capsule (100 mg total) by mouth 3 (three) times daily as needed for cough.  20 capsule  0  . carvedilol (COREG) 12.5 MG tablet Take 1 tablet (12.5 mg total) by mouth 2 (two) times daily with a meal.      .  chlorhexidine (PERIDEX) 0.12 % solution 15 mLs by Mouth Rinse route 2 (two) times daily.  120 mL  0  . chlorpheniramine-HYDROcodone (TUSSIONEX) 10-8 MG/5ML LQCR Take 5 mLs by mouth every 12 (twelve) hours as needed for cough.    0  . clopidogrel (PLAVIX) 75 MG tablet Take 1 tablet (75 mg total) by mouth daily with breakfast.      . colchicine 0.6 MG tablet Take 1 tablet (0.6 mg total) by mouth daily.      . collagenase (SANTYL) ointment Apply topically daily.  15 g  0  . feeding supplement, ENSURE COMPLETE, (ENSURE COMPLETE) LIQD Take 237 mLs by mouth 2 (two) times daily between meals.      . folic  acid (FOLVITE) 1 MG tablet Take 1 tablet (1 mg total) by mouth daily.      . furosemide (LASIX) 40 MG tablet Take 1 tablet (40 mg total) by mouth 2 (two) times daily.  30 tablet    . gabapentin (NEURONTIN) 300 MG capsule Take 1 capsule (300 mg total) by mouth 3 (three) times daily.  90 capsule  3  . lisinopril (PRINIVIL,ZESTRIL) 5 MG tablet Take 1 tablet (5 mg total) by mouth daily.      . Multiple Vitamin (MULTIVITAMIN WITH MINERALS) TABS tablet Take 1 tablet by mouth daily.      Marland Kitchen oxyCODONE-acetaminophen (PERCOCET/ROXICET) 5-325 MG per tablet Take 1-2 tablets by mouth every 4 (four) hours as needed for severe pain.  30 tablet  0  . spironolactone (ALDACTONE) 12.5 mg TABS tablet Take 0.5 tablets (12.5 mg total) by mouth daily.      Marland Kitchen thiamine 100 MG tablet Take 1 tablet (100 mg total) by mouth daily.       No current facility-administered medications for this encounter.     PHYSICAL EXAM: Filed Vitals:   08/23/13 1533  BP: 78/62  Pulse: 72  SpO2: 93%   BP 66 by Doppler  General: Fatigues appearing. Sitting in WC Neck: Thick, JVP flat cm, no thyromegaly or thyroid nodule.  Lungs: Clear to auscultation bilaterally with normal respiratory effort.  CV: Laterally displaced PMI. Heart regular S1/S2, no S3/S4, no murmur.  Abdomen: Soft, nontender, no hepatosplenomegaly, no distention.  Neurologic: Alert and oriented x 3.  Psych: Normal affect.  Extremities: No clubbing or cyanosis. Wounds on BLE. Mild edema on L. None on R   ASSESSMENT & PLAN:  1. Hypotension-  Suspect overdiuresis. He is quite symptomatic. Will take to ER for IVF and likely overnight observation. Check labs. Hold lasix 2. CAD: Out of hospital MI with totally occluded mid LAD and mid RCA, no collaterals. RV infarction on echo. Will have to be medically managed. Given evidence for RV infarction, suspect RCA occlusion was acute event and LAD is a chronic occlusion. Unfortunately does not have a lot of good options. Not PCI  candidate and no targets for CABG. RV looks bad, at this time would not be LVAD candidate.  - Continue ASA, statin, Plavix.  3. Chronic systolic CHF with ischemic cardiomyopathy: EF 20% on LV-gram, LVEDP 29 mmHg. ECHO reviewed, EF 30-35% with akinetic apex, septal wall, and inferior wall. RV moderately dilated and with severely reduced systolic function (consistent with RV infarction).  -currently hypotensive. Will hydrate. Hold diuretics and cardiac meds for now. 4. OSA: CPAP at night.  5. PAD: Ischemic toes on right foot. Vascular signed off. Peripheral angiogram with bilateral occluded PTs, no intervention. Follow clinically. This is inhibiting his ambulation. Will need  vascular followup at discharge.  6. Pericardial effusion: Suspect post-infarction pericarditiss/p pericardiocentesis.  - will repeat echo Continue colchicine 0.6 mg daily x 3 months.  7. Dispo: To ER. [facility] to [facility]. We will follow in consult.   Mazin Emma,MD 4:55 PM

## 2013-08-23 NOTE — ED Provider Notes (Signed)
CSN: [id number]     Arrival date & time 08/23/13  1705 History   First MD Initiated Contact with Patient 08/23/13 1731     Chief Complaint  Patient presents with  . Hypotension     (Consider location/radiation/quality/duration/timing/severity/associated sxs/prior Treatment) The history is provided by the patient.   63 year old male sent over from cardiology for blood pressure of 66 systolic. Patient's only complaint today was shortness of breath. This was a routine off this visit. Patient was admitted the end of May and discharged home on June 5 for myocardial infarction is known to have congestive heart failure. Cardiology presumed that he was in congestive heart failure. Patient arrived on 3 L of oxygen with oxygen saturations around 95%. However blood pressure was in the 61W systolic. Patient mentating fine really with no specific complaints that he feels much better on the oxygen.  Past Medical History  Diagnosis Date  . Chronic low back pain   . 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 Chronic neck pain   . OSA (obstructive sleep apnea)     CPAP @ bedtime  . Allergy   . Unspecified essential hypertension     Resistant, 2D Echo - [id number]  . Polycythemia   . CAD (coronary artery disease)     Vessel type unspecified  . PVD (peripheral vascular disease)   . DDD (degenerative disc disease), cervical    Past Surgical History  Procedure Laterality Date  . Lower extrmity doppler      [place] 2008; no evidence for PAD  . US echocardiography  11/2008    Normal valves, mild LAE  . Left knee replacement  10/2006  . Cervical spine surgery  02/2008  . Joint replacement    . Spine surgery     Family History  Problem Relation Age of Onset  . Heart attack Father    History  Substance Use Topics  . Smoking status: Former Smoker -- 0 years    Types: Cigarettes, Pipe    Quit date: 03/15/1968  . Smokeless tobacco: Former Systems developer     Comment: Pt  quit Cigarettes 1970's- and cigars & chew 1990  . Alcohol Use: 3.6 oz/week    6 Cans of beer per week    Review of Systems  Constitutional: Negative for fever.  HENT: Negative for congestion.   Eyes: Negative for pain and redness.  Respiratory: Positive for shortness of breath.   Cardiovascular: Positive for leg swelling. Negative for chest pain.  Gastrointestinal: Negative for abdominal pain.  Genitourinary: Negative for dysuria.  Musculoskeletal: Negative for back pain.  Skin: Negative for pallor and rash.  Neurological: Negative for light-headedness and headaches.  Hematological: Bruises/bleeds easily.  Psychiatric/Behavioral: Negative for confusion.      Allergies  Review of patient's allergies indicates no known allergies.  Home Medications   Prior to Admission medications   Medication Sig Start Date End Date Taking? Authorizing Provider  aspirin 81 MG chewable tablet Chew 1 tablet (81 mg total) by mouth daily. 08/17/13   Geradine Girt, DO  atorvastatin (LIPITOR) 80 MG tablet Take 1 tablet (80 mg total) by mouth daily at 6 PM. 08/17/13   Geradine Girt, DO  benzonatate (TESSALON) 100 MG capsule Take 1 capsule (100 mg total) by mouth 3 (three) times daily as needed for cough. 08/17/13   Geradine Girt, DO  carvedilol (COREG) 12.5 MG tablet Take 1 tablet (12.5 mg total) by mouth 2 (two) times daily  with a meal. 08/17/13   Geradine Girt, DO  chlorhexidine (PERIDEX) 0.12 % solution 15 mLs by Mouth Rinse route 2 (two) times daily. 08/17/13   Geradine Girt, DO  chlorpheniramine-HYDROcodone (TUSSIONEX) 10-8 MG/5ML LQCR Take 5 mLs by mouth every 12 (twelve) hours as needed for cough. 08/17/13   Geradine Girt, DO  clopidogrel (PLAVIX) 75 MG tablet Take 1 tablet (75 mg total) by mouth daily with breakfast. 08/17/13   Geradine Girt, DO  colchicine 0.6 MG tablet Take 1 tablet (0.6 mg total) by mouth daily. 08/17/13   Geradine Girt, DO  collagenase (SANTYL) ointment Apply topically daily. 08/17/13    Geradine Girt, DO  feeding supplement, ENSURE COMPLETE, (ENSURE COMPLETE) LIQD Take 237 mLs by mouth 2 (two) times daily between meals. 08/17/13   Geradine Girt, DO  folic acid (FOLVITE) 1 MG tablet Take 1 tablet (1 mg total) by mouth daily. 08/17/13   Geradine Girt, DO  furosemide (LASIX) 40 MG tablet Take 1 tablet (40 mg total) by mouth 2 (two) times daily. 08/17/13   Geradine Girt, DO  gabapentin (NEURONTIN) 300 MG capsule Take 1 capsule (300 mg total) by mouth 3 (three) times daily. 02/21/13   Rowe Clack, MD  lisinopril (PRINIVIL,ZESTRIL) 5 MG tablet Take 1 tablet (5 mg total) by mouth daily. 08/17/13   Geradine Girt, DO  Multiple Vitamin (MULTIVITAMIN WITH MINERALS) TABS tablet Take 1 tablet by mouth daily. 08/17/13   Geradine Girt, DO  oxyCODONE-acetaminophen (PERCOCET/ROXICET) 5-325 MG per tablet Take 1-2 tablets by mouth every 4 (four) hours as needed for severe pain. 08/17/13   Geradine Girt, DO  spironolactone (ALDACTONE) 12.5 mg TABS tablet Take 0.5 tablets (12.5 mg total) by mouth daily. 08/17/13   Geradine Girt, DO  thiamine 100 MG tablet Take 1 tablet (100 mg total) by mouth daily. 08/17/13   Geradine Girt, DO   BP 64/52  Pulse 73  Temp(Src) 97.9 F (36.6 C) (Oral)  Resp 22  SpO2 92% Physical Exam  ED Course  Procedures (including critical care time) Labs Review Labs Reviewed  CBC WITH DIFFERENTIAL - Abnormal; Notable for the following:    WBC 18.1 (*)    RDW 19.6 (*)    Platelets 759 (*)    Neutrophils Relative % 86 (*)    Neutro Abs 15.7 (*)    Lymphocytes Relative 8 (*)    All other components within normal limits  COMPREHENSIVE METABOLIC PANEL - Abnormal; Notable for the following:    Sodium 131 (*)    Potassium 5.4 (*)    Chloride 93 (*)    Glucose, Bld 104 (*)    BUN 79 (*)    Creatinine, Ser 1.58 (*)    Albumin 2.8 (*)    Alkaline Phosphatase 204 (*)    GFR calc non Af Amer 45 (*)    GFR calc Af Amer 52 (*)    All other components within normal limits   PRO B NATRIURETIC PEPTIDE - Abnormal; Notable for the following:    Pro B Natriuretic peptide (BNP) 9383.0 (*)    All other components within normal limits  URINALYSIS, ROUTINE W REFLEX MICROSCOPIC - Abnormal; Notable for the following:    APPearance TURBID (*)    Hgb urine dipstick LARGE (*)    Protein, ur 30 (*)    Leukocytes, UA LARGE (*)    All other components within normal limits  PROTIME-INR - Abnormal;  Notable for the following:    Prothrombin Time 15.3 (*)    All other components within normal limits  URINE MICROSCOPIC-ADD ON - Abnormal; Notable for the following:    Bacteria, UA MANY (*)    All other components within normal limits  I-STAT TROPOININ, ED - Abnormal; Notable for the following:    Troponin i, poc 0.20 (*)    All other components within normal limits  CULTURE, BLOOD (ROUTINE X 2)  CULTURE, BLOOD (ROUTINE X 2)  URINE CULTURE  TROPONIN I  I-STAT CG4 LACTIC ACID, ED  CBG MONITORING, ED   Results for orders placed during the hospital encounter of 08/23/13  CBC WITH DIFFERENTIAL      Result Value Ref Range   WBC 18.1 (*) 4.0 - 10.5 K/uL   RBC 5.19  4.22 - 5.81 MIL/uL   Hemoglobin 14.2  13.0 - 17.0 g/dL   HCT 44.6  39.0 - 52.0 %   MCV 85.9  78.0 - 100.0 fL   MCH 27.4  26.0 - 34.0 pg   MCHC 31.8  30.0 - 36.0 g/dL   RDW 19.6 (*) 11.5 - 15.5 %   Platelets 759 (*) 150 - 400 K/uL   Neutrophils Relative % 86 (*) 43 - 77 %   Neutro Abs 15.7 (*) 1.7 - 7.7 K/uL   Lymphocytes Relative 8 (*) 12 - 46 %   Lymphs Abs 1.4  0.7 - 4.0 K/uL   Monocytes Relative 3  3 - 12 %   Monocytes Absolute 0.5  0.1 - 1.0 K/uL   Eosinophils Relative 3  0 - 5 %   Eosinophils Absolute 0.5  0.0 - 0.7 K/uL   Basophils Relative 0  0 - 1 %   Basophils Absolute 0.0  0.0 - 0.1 K/uL  COMPREHENSIVE METABOLIC PANEL      Result Value Ref Range   Sodium 131 (*) 137 - 147 mEq/L   Potassium 5.4 (*) 3.7 - 5.3 mEq/L   Chloride 93 (*) 96 - 112 mEq/L   CO2 24  19 - 32 mEq/L   Glucose, Bld 104  (*) 70 - 99 mg/dL   BUN 79 (*) 6 - 23 mg/dL   Creatinine, Ser 1.58 (*) 0.50 - 1.35 mg/dL   Calcium 9.1  8.4 - 10.5 mg/dL   Total Protein 6.7  6.0 - 8.3 g/dL   Albumin 2.8 (*) 3.5 - 5.2 g/dL   AST 34  0 - 37 U/L   ALT 46  0 - 53 U/L   Alkaline Phosphatase 204 (*) 39 - 117 U/L   Total Bilirubin 0.9  0.3 - 1.2 mg/dL   GFR calc non Af Amer 45 (*) >90 mL/min   GFR calc Af Amer 52 (*) >90 mL/min  PRO B NATRIURETIC PEPTIDE      Result Value Ref Range   Pro B Natriuretic peptide (BNP) 9383.0 (*) 0 - 125 pg/mL  URINALYSIS, ROUTINE W REFLEX MICROSCOPIC      Result Value Ref Range   Color, Urine YELLOW  YELLOW   APPearance TURBID (*) CLEAR   Specific Gravity, Urine 1.017  1.005 - 1.030   pH 5.0  5.0 - 8.0   Glucose, UA NEGATIVE  NEGATIVE mg/dL   Hgb urine dipstick LARGE (*) NEGATIVE   Bilirubin Urine NEGATIVE  NEGATIVE   Ketones, ur NEGATIVE  NEGATIVE mg/dL   Protein, ur 30 (*) NEGATIVE mg/dL   Urobilinogen, UA 1.0  0.0 - 1.0 mg/dL   Nitrite NEGATIVE  NEGATIVE   Leukocytes, UA LARGE (*) NEGATIVE  PROTIME-INR      Result Value Ref Range   Prothrombin Time 15.3 (*) 11.6 - 15.2 seconds   INR 1.24  0.00 - 1.49  TROPONIN I      Result Value Ref Range   Troponin I <0.30  <0.30 ng/mL  URINE MICROSCOPIC-ADD ON      Result Value Ref Range   WBC, UA TOO NUMEROUS TO COUNT  <3 WBC/hpf   RBC / HPF 3-6  <3 RBC/hpf   Bacteria, UA MANY (*) RARE  I-STAT CG4 LACTIC ACID, ED      Result Value Ref Range   Lactic Acid, Venous 0.92  0.5 - 2.2 mmol/L  I-STAT TROPOININ, ED      Result Value Ref Range   Troponin i, poc 0.20 (*) 0.00 - 0.08 ng/mL   Comment NOTIFIED PHYSICIAN     Comment 3           CBG MONITORING, ED      Result Value Ref Range   Glucose-Capillary 94  70 - 99 mg/dL   Comment 1 Documented in Chart     Comment 2 Notify RN       Imaging Review Ct Abdomen Pelvis W Contrast  08/23/2013   CLINICAL DATA:  Fall during physical therapy today. Bruising around the abdomen.  EXAM: CT  ABDOMEN AND PELVIS WITH CONTRAST  TECHNIQUE: Multidetector CT imaging of the abdomen and pelvis was performed using the standard protocol following bolus administration of intravenous contrast.  CONTRAST:  143mL OMNIPAQUE IOHEXOL 300 MG/ML  SOLN  COMPARISON:  None.  FINDINGS: Bones: Nondisplaced left ninth lateral rib fracture is present. Congenitally narrow spinal canal. Moderate thoracolumbar spondylosis. Thoracolumbar vertebral body height is preserved. Pelvic rings appear intact. Both hips are located.  Lung Bases: Small right pleural effusion. Associated atelectasis. Coronary artery atherosclerosis.  Liver:  Liver appears within normal limits.  No perihepatic fluid.  Spleen: Splenomegaly is present with 17 cm splenic span. There are scattered areas of areas of low attenuation in the inferior spleen compatible with splenic contusion. There is no pseudoaneurysm or perisplenic blood the suggest laceration. Splenic infarct is considered less likely although it could have this appearance. The present 7 adjacent rib fracture suggest trauma as the mechanism.  Gallbladder:  Contracted.  Common bile duct:  Normal.  Pancreas:  Normal.  Adrenal glands: Small nodule adjacent to the left adrenal gland is most compatible with a splenuculus.  Kidneys: Nonspecific bilateral perinephric stranding. 1 cm left upper pole renal cysts. Both ureters appear within normal limits. There is no delayed excretion of contrast following a 3 minutes delay, which may be secondary to poor renal function or contrast induced nephropathy. 4 mm nonobstructing calculus in the left inferior renal pole.  Stomach:  Collapsed.  Small hiatal hernia.  Small bowel:  Normal.  Colon:   Colonic diverticulosis.  No diverticulitis.  Pelvic Genitourinary: Thickening of the urinary bladder the degree of distension, raising the possibility of chronic bladder outflow obstruction.  Vasculature: Atherosclerosis. No acute vascular abnormality. No extravasation of  contrast.  Body Wall: Fat containing periumbilical hernia. Stranding along both flanks, greater on the right than left which may represent contusion or subcutaneous edema.  IMPRESSION: 1. Multiple areas of low attenuation in an enlarged spleen. These are compatible with splenic contusions. Splenic infarcts considered less likely. Nearby nondisplaced left lateral ninth rib fracture. No perisplenic fluid to suggest laceration and no extravasation of contrast. 2. Splenomegaly with  17 cm splenic span. 3. No delayed excretion of contrast from the kidneys suggesting contrast induced nephropathy. This could also be associated with chronic renal insufficiency. Recommend assessment of renal function. 4. Small right pleural effusion with associated atelectasis. 5. Stranding in the body wall along both flanks, right greater than left suggesting contusion.   Electronically Signed   By: Dereck Ligas M.D.   On: 08/23/2013 20:31   Dg Chest Port 1 View  08/23/2013   CLINICAL DATA:  Hypotension.  EXAM: PORTABLE CHEST - 1 VIEW  COMPARISON:  08/23/2013.  02/21/2013.  FINDINGS: Cardiomegaly. No airspace disease or effusion. Basilar atelectasis. Apical lordotic projection. Lower cervical ACDF. Monitoring leads project over the chest.  IMPRESSION: Cardiomegaly without failure.   Electronically Signed   By: Dereck Ligas M.D.   On: 08/23/2013 20:36   Dg Chest Port 1 View  08/23/2013   CLINICAL DATA:  Left-sided chest pain  EXAM: PORTABLE CHEST - 1 VIEW  COMPARISON:  08/15/2013  FINDINGS: Cardiac shadow remains enlarged. The lungs are clear. Postsurgical changes are noted in the cervical spine. No acute bony abnormality is noted.  IMPRESSION: No acute abnormality seen.  Stable cardiomegaly.   Electronically Signed   By: Inez Catalina M.D.   On: 08/23/2013 18:34     EKG Interpretation None       Date: 08/23/2013  Rate: 74  Rhythm: normal sinus rhythm  QRS Axis: normal  Intervals: normal  ST/T Wave abnormalities:  nonspecific ST/T changes  Conduction Disutrbances:nonspecific intraventricular conduction delay  Narrative Interpretation:   Old EKG Reviewed: none available    CRITICAL CARE Performed by: Raejean Swinford Total critical care time: 60 Critical care time was exclusive of separately billable procedures and treating other patients. Critical care was necessary to treat or prevent imminent or life-threatening deterioration. Critical care was time spent personally by me on the following activities: development of treatment plan with patient and/or surrogate as well as nursing, discussions with consultants, evaluation of patient's response to treatment, examination of patient, obtaining history from patient or surrogate, ordering and performing treatments and interventions, ordering and review of laboratory studies, ordering and review of radiographic studies, pulse oximetry and re-evaluation of patient's condition.   MDM   Final diagnoses:  Hypotension  Chronic systolic heart failure  UTI (lower urinary tract infection)    Patient sent over from cardiology he was here for routine followup. Noted to have a blood pressure of 66 systolic. Patient only with a feeling of shortness of breath. Denied any specific pain. Patient was admitted the end of may with a myocardial infarction. Patient was discharged June 5 center rehabilitation that's where he was today before coming to see cardiology.  This is because some of this to your extensive workup in the emergency Department try to deal with his hypotension. Noted that patient would respond to 250 cc boluses of normal saline blood pressure would come up to the low 90s. They were dropped back down again. Extensive workup left is only with a significant finding of a significant urinary tract infection and thought maybe this could be due to a urosepsis-type picture. Patient's lactic acid wasn't elevated. Patient had massive bruising of the lower part of his  abdomen from may be that he had a retroperitoneal pelvic hematoma so CT scan was done that was negative. Patient was dropped earlier today. Patient's initial 0.2 troponin was elevated as was initial concern for a non-STEMI however normal troponin was normal so that was ruled out. Patient sounds  as if he was starting to get wet with fluid boluses to repeat chest x-ray was done just showed no of fluid overload.  In addition cardiology and admitting team was made aware of it when the patient was in the hospital before he did have fluid drawn off his pericardial sac so that's a possibility as a cause for the hypotension but would not expect him to respond to fluid boluses.  Also important to mention that the CT scan did show enlarged spleen is probably baseline for him questionable contusion to the spleen but he has no tenderness or injuries in that area there was definitely no evidence of any fluid around the spleen to suggest blood loss. Also shows evidence of a left rib fracture but family thinks that occurred several months ago.  Patient will be admitted to step down unit. For the urinary tract infection patient was started on Rocephin did have blood cultures and urine culture sent prior to that. Cardiology has seen the patient. Temporary middle orders have been completed.    Fredia Sorrow, MD 08/23/13 2126

## 2013-08-23 NOTE — ED Notes (Signed)
Pt with large contusion spreading across the right and left lower quadrants. Family states that the pt fell, and that staff tried to lift him with his gait belt.   Pt given ice chips.

## 2013-08-23 NOTE — Consult Note (Signed)
Reason for Consult: hypotension, heart failure Referring Physician: Dr. Hermina Barters is an 63 y.o. male.  HPI: Mr. Meleski is a 63 yo man with PMH of polycythemia vera, COPD, CAD with recent significant infarct without ability to be revascularized, PAD, last known EF 20% and bilateral lower extremity non-healing ulcers who had out of hospital LAD infarct noted after ER admission 08/07/13 with LHC revealing significant 2v CAD of totally occluded mid/distal LAD and RCA, no targets for PCI/CABG, hypokinetic RV that is severely dilated who was seen by Dr. Haroldine Laws in clinic today in follow-up and noted to have SBP of 66. He has been at [facility] and continued to feel weak. He had a fall earlier today and he was hoisted back up using the assisted lap-band around his waist that has already left deep visible bruising across his abdomen. In the ER he received some IV fluids, IV antibiotics, CT C/A/P and Cardiology consulted given his HF. Mr. Rogus tells me he feels a bit better. He was accompanied by family members. We discussed his findings of apparent UTI and need to treat with antibiotics and carefully watch gentle rehydration. His bps have improved tot he 90s. No overt fever/chills/nausea/vomiting/diarrhea. He has had some dizziness. No frank syncope.    Past Medical History  Diagnosis Date  . Chronic low back pain   . 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 Chronic neck pain   . OSA (obstructive sleep apnea)     CPAP @ bedtime  . Allergy   . Unspecified essential hypertension     Resistant, 2D Echo - EF-55-60  . Polycythemia   . CAD (coronary artery disease)     Vessel type unspecified  . PVD (peripheral vascular disease)   . DDD (degenerative disc disease), cervical     Past Surgical History  Procedure Laterality Date  . Lower extrmity doppler      [place] 2008; no evidence for PAD  . US echocardiography  11/2008    Normal  valves, mild LAE  . Left knee replacement  10/2006  . Cervical spine surgery  02/2008  . Joint replacement    . Spine surgery      Family History  Problem Relation Age of Onset  . Heart attack Father     Social History:  reports that he quit smoking about 45 years ago. His smoking use included Cigarettes and Pipe. He smoked 0.00 packs per day for 0 years. He has quit using smokeless tobacco. He reports that he drinks about 3.6 ounces of alcohol per week. He reports that he does not use illicit drugs.  Allergies: No Known Allergies  Medications:  I have reviewed the patient's current medications. Prior to Admission:  Prescriptions prior to admission  Medication Sig Dispense Refill  . aspirin 81 MG chewable tablet Chew 1 tablet (81 mg total) by mouth daily.      Marland Kitchen atorvastatin (LIPITOR) 80 MG tablet Take 1 tablet (80 mg total) by mouth daily at 6 PM.      . benzonatate (TESSALON) 100 MG capsule Take 1 capsule (100 mg total) by mouth 3 (three) times daily as needed for cough.  20 capsule  0  . carvedilol (COREG) 12.5 MG tablet Take 1 tablet (12.5 mg total) by mouth 2 (two) times daily with a meal.      . chlorhexidine (PERIDEX) 0.12 % solution 15 mLs by Mouth Rinse route 2 (two) times daily.  120 mL  0  . chlorpheniramine-HYDROcodone (TUSSIONEX) 10-8 MG/5ML LQCR Take 5 mLs by mouth every 12 (twelve) hours as needed for cough.    0  . clopidogrel (PLAVIX) 75 MG tablet Take 1 tablet (75 mg total) by mouth daily with breakfast.      . colchicine 0.6 MG tablet Take 1 tablet (0.6 mg total) by mouth daily.      . collagenase (SANTYL) ointment Apply topically daily.  15 g  0  . feeding supplement, ENSURE COMPLETE, (ENSURE COMPLETE) LIQD Take 237 mLs by mouth 2 (two) times daily between meals.      . folic acid (FOLVITE) 1 MG tablet Take 1 tablet (1 mg total) by mouth daily.      . furosemide (LASIX) 40 MG tablet Take 1 tablet (40 mg total) by mouth 2 (two) times daily.  30 tablet    .  gabapentin (NEURONTIN) 300 MG capsule Take 1 capsule (300 mg total) by mouth 3 (three) times daily.  90 capsule  3  . lisinopril (PRINIVIL,ZESTRIL) 5 MG tablet Take 1 tablet (5 mg total) by mouth daily.      . Multiple Vitamin (MULTIVITAMIN WITH MINERALS) TABS tablet Take 1 tablet by mouth daily.      Marland Kitchen oxyCODONE-acetaminophen (PERCOCET/ROXICET) 5-325 MG per tablet Take 1-2 tablets by mouth every 4 (four) hours as needed for severe pain.  30 tablet  0  . spironolactone (ALDACTONE) 12.5 mg TABS tablet Take 0.5 tablets (12.5 mg total) by mouth daily.      Marland Kitchen thiamine 100 MG tablet Take 1 tablet (100 mg total) by mouth daily.       Scheduled: . [START ON 08/24/2013] aspirin  81 mg Oral Daily  . [START ON 08/24/2013] atorvastatin  80 mg Oral q1800  . [START ON 08/24/2013] cefTRIAXone (ROCEPHIN)  IV  1 g Intravenous QHS  . [START ON 08/24/2013] clopidogrel  75 mg Oral Q breakfast  . [START ON 08/24/2013] colchicine  0.6 mg Oral Daily  . [START ON 4/76/5465] folic acid  1 mg Oral Daily  . gabapentin  300 mg Oral TID  . sodium chloride  500 mL Intravenous Once  . sodium chloride  3 mL Intravenous Q12H  . [START ON 08/24/2013] thiamine  100 mg Oral Daily   Continuous:   Results for orders placed during the hospital encounter of 08/23/13 (from the past 48 hour(s))  CBG MONITORING, ED     Status: None   Collection Time    08/23/13  5:48 PM      Result Value Ref Range   Glucose-Capillary 94  70 - 99 mg/dL   Comment 1 Documented in Chart     Comment 2 Notify RN    CBC WITH DIFFERENTIAL     Status: Abnormal   Collection Time    08/23/13  5:58 PM      Result Value Ref Range   WBC 18.1 (*) 4.0 - 10.5 K/uL   RBC 5.19  4.22 - 5.81 MIL/uL   Hemoglobin 14.2  13.0 - 17.0 g/dL   HCT 44.6  39.0 - 52.0 %   MCV 85.9  78.0 - 100.0 fL   MCH 27.4  26.0 - 34.0 pg   MCHC 31.8  30.0 - 36.0 g/dL   RDW 19.6 (*) 11.5 - 15.5 %   Platelets 759 (*) 150 - 400 K/uL   Neutrophils Relative % 86 (*) 43 - 77 %   Neutro  Abs 15.7 (*) 1.7 - 7.7  K/uL   Lymphocytes Relative 8 (*) 12 - 46 %   Lymphs Abs 1.4  0.7 - 4.0 K/uL   Monocytes Relative 3  3 - 12 %   Monocytes Absolute 0.5  0.1 - 1.0 K/uL   Eosinophils Relative 3  0 - 5 %   Eosinophils Absolute 0.5  0.0 - 0.7 K/uL   Basophils Relative 0  0 - 1 %   Basophils Absolute 0.0  0.0 - 0.1 K/uL  COMPREHENSIVE METABOLIC PANEL     Status: Abnormal   Collection Time    08/23/13  5:58 PM      Result Value Ref Range   Sodium 131 (*) 137 - 147 mEq/L   Potassium 5.4 (*) 3.7 - 5.3 mEq/L   Chloride 93 (*) 96 - 112 mEq/L   CO2 24  19 - 32 mEq/L   Glucose, Bld 104 (*) 70 - 99 mg/dL   BUN 79 (*) 6 - 23 mg/dL   Creatinine, Ser 1.58 (*) 0.50 - 1.35 mg/dL   Calcium 9.1  8.4 - 10.5 mg/dL   Total Protein 6.7  6.0 - 8.3 g/dL   Albumin 2.8 (*) 3.5 - 5.2 g/dL   AST 34  0 - 37 U/L   ALT 46  0 - 53 U/L   Alkaline Phosphatase 204 (*) 39 - 117 U/L   Total Bilirubin 0.9  0.3 - 1.2 mg/dL   GFR calc non Af Amer 45 (*) >90 mL/min   GFR calc Af Amer 52 (*) >90 mL/min   Comment: (NOTE)     The eGFR has been calculated using the CKD EPI equation.     This calculation has not been validated in all clinical situations.     eGFR's persistently <90 mL/min signify possible Chronic Kidney     Disease.  PROTIME-INR     Status: Abnormal   Collection Time    08/23/13  5:58 PM      Result Value Ref Range   Prothrombin Time 15.3 (*) 11.6 - 15.2 seconds   INR 1.24  0.00 - 1.49  PRO B NATRIURETIC PEPTIDE     Status: Abnormal   Collection Time    08/23/13  5:59 PM      Result Value Ref Range   Pro B Natriuretic peptide (BNP) 9383.0 (*) 0 - 125 pg/mL  I-STAT TROPOININ, ED     Status: Abnormal   Collection Time    08/23/13  6:03 PM      Result Value Ref Range   Troponin i, poc 0.20 (*) 0.00 - 0.08 ng/mL   Comment NOTIFIED PHYSICIAN     Comment 3            Comment: Due to the release kinetics of cTnI,     a negative result within the first hours     of the onset of symptoms does  not rule out     myocardial infarction with certainty.     If myocardial infarction is still suspected,     repeat the test at appropriate intervals.  I-STAT CG4 LACTIC ACID, ED     Status: None   Collection Time    08/23/13  6:06 PM      Result Value Ref Range   Lactic Acid, Venous 0.92  0.5 - 2.2 mmol/L  TROPONIN I     Status: None   Collection Time    08/23/13  6:19 PM      Result Value Ref Range  Troponin I <0.30  <0.30 ng/mL   Comment:            Due to the release kinetics of cTnI,     a negative result within the first hours     of the onset of symptoms does not rule out     myocardial infarction with certainty.     If myocardial infarction is still suspected,     repeat the test at appropriate intervals.  URINALYSIS, ROUTINE W REFLEX MICROSCOPIC     Status: Abnormal   Collection Time    08/23/13  6:35 PM      Result Value Ref Range   Color, Urine YELLOW  YELLOW   APPearance TURBID (*) CLEAR   Specific Gravity, Urine 1.017  1.005 - 1.030   pH 5.0  5.0 - 8.0   Glucose, UA NEGATIVE  NEGATIVE mg/dL   Hgb urine dipstick LARGE (*) NEGATIVE   Bilirubin Urine NEGATIVE  NEGATIVE   Ketones, ur NEGATIVE  NEGATIVE mg/dL   Protein, ur 30 (*) NEGATIVE mg/dL   Urobilinogen, UA 1.0  0.0 - 1.0 mg/dL   Nitrite NEGATIVE  NEGATIVE   Leukocytes, UA LARGE (*) NEGATIVE  URINE MICROSCOPIC-ADD ON     Status: Abnormal   Collection Time    08/23/13  6:35 PM      Result Value Ref Range   WBC, UA TOO NUMEROUS TO COUNT  <3 WBC/hpf   RBC / HPF 3-6  <3 RBC/hpf   Bacteria, UA MANY (*) RARE  GLUCOSE, CAPILLARY     Status: Abnormal   Collection Time    08/23/13 10:25 PM      Result Value Ref Range   Glucose-Capillary 108 (*) 70 - 99 mg/dL    Dg Lumbar Spine 2-3 Views  08/23/2013   CLINICAL DATA:  Right hip pain and low back pain after a fall.  EXAM: LUMBAR SPINE - 2-3 VIEW  COMPARISON:  CT abdomen and pelvis 08/23/2013  FINDINGS: Technically limited study due to underpenetration. Lumbar  spine demonstrates normal alignment without significant anterior subluxation. Diffuse degenerative changes with narrowed lumbar interspaces and associated endplate hypertrophic changes. No vertebral compression deformities or displaced fractures are suggested.  IMPRESSION: Degenerative changes in the lumbar spine. No displaced fractures identified.   Electronically Signed   By: Lucienne Capers M.D.   On: 08/23/2013 23:39   Dg Pelvis 1-2 Views  08/23/2013   CLINICAL DATA:  Right hip pain after a fall.  EXAM: PELVIS - 1-2 VIEW  COMPARISON:  CT abdomen and pelvis 08/23/2013  FINDINGS: Degenerative changes in both hips. No evidence of acute fracture or dislocation of the pelvis, right hip, or left hip. SI joints and symphysis pubis are not displaced. No focal bone lesions are appreciated.  IMPRESSION: Degenerative changes.  No displaced fractures identified.   Electronically Signed   By: Lucienne Capers M.D.   On: 08/23/2013 23:40   Dg Sacrum/coccyx  08/23/2013   CLINICAL DATA:  Right hip pain after a fall.  EXAM: SACRUM AND COCCYX - 2+ VIEW  COMPARISON:  CT abdomen and pelvis 08/23/2013  FINDINGS: Residual contrast material in the bladder from recent CT scan. This obscures visualization of the sacrum on the AP view. On the lateral view, the sacrum appears grossly intact without displacement demonstrated. No destructive bone lesions are appreciated.  IMPRESSION: Visualization of the sacrum is limited due to contrast material in the bladder. No displaced fractures are demonstrated.   Electronically Signed   By: Gwyndolyn Saxon  Gerilyn Nestle M.D.   On: 08/23/2013 23:38   Ct Abdomen Pelvis W Contrast  08/23/2013   CLINICAL DATA:  Fall during physical therapy today. Bruising around the abdomen.  EXAM: CT ABDOMEN AND PELVIS WITH CONTRAST  TECHNIQUE: Multidetector CT imaging of the abdomen and pelvis was performed using the standard protocol following bolus administration of intravenous contrast.  CONTRAST:  134m OMNIPAQUE  IOHEXOL 300 MG/ML  SOLN  COMPARISON:  None.  FINDINGS: Bones: Nondisplaced left ninth lateral rib fracture is present. Congenitally narrow spinal canal. Moderate thoracolumbar spondylosis. Thoracolumbar vertebral body height is preserved. Pelvic rings appear intact. Both hips are located.  Lung Bases: Small right pleural effusion. Associated atelectasis. Coronary artery atherosclerosis.  Liver:  Liver appears within normal limits.  No perihepatic fluid.  Spleen: Splenomegaly is present with 17 cm splenic span. There are scattered areas of areas of low attenuation in the inferior spleen compatible with splenic contusion. There is no pseudoaneurysm or perisplenic blood the suggest laceration. Splenic infarct is considered less likely although it could have this appearance. The present 7 adjacent rib fracture suggest trauma as the mechanism.  Gallbladder:  Contracted.  Common bile duct:  Normal.  Pancreas:  Normal.  Adrenal glands: Small nodule adjacent to the left adrenal gland is most compatible with a splenuculus.  Kidneys: Nonspecific bilateral perinephric stranding. 1 cm left upper pole renal cysts. Both ureters appear within normal limits. There is no delayed excretion of contrast following a 3 minutes delay, which may be secondary to poor renal function or contrast induced nephropathy. 4 mm nonobstructing calculus in the left inferior renal pole.  Stomach:  Collapsed.  Small hiatal hernia.  Small bowel:  Normal.  Colon:   Colonic diverticulosis.  No diverticulitis.  Pelvic Genitourinary: Thickening of the urinary bladder the degree of distension, raising the possibility of chronic bladder outflow obstruction.  Vasculature: Atherosclerosis. No acute vascular abnormality. No extravasation of contrast.  Body Wall: Fat containing periumbilical hernia. Stranding along both flanks, greater on the right than left which may represent contusion or subcutaneous edema.  IMPRESSION: 1. Multiple areas of low attenuation in  an enlarged spleen. These are compatible with splenic contusions. Splenic infarcts considered less likely. Nearby nondisplaced left lateral ninth rib fracture. No perisplenic fluid to suggest laceration and no extravasation of contrast. 2. Splenomegaly with 17 cm splenic span. 3. No delayed excretion of contrast from the kidneys suggesting contrast induced nephropathy. This could also be associated with chronic renal insufficiency. Recommend assessment of renal function. 4. Small right pleural effusion with associated atelectasis. 5. Stranding in the body wall along both flanks, right greater than left suggesting contusion.   Electronically Signed   By: GDereck LigasM.D.   On: 08/23/2013 20:31   Dg Chest Port 1 View  08/23/2013   CLINICAL DATA:  Hypotension.  EXAM: PORTABLE CHEST - 1 VIEW  COMPARISON:  08/23/2013.  02/21/2013.  FINDINGS: Cardiomegaly. No airspace disease or effusion. Basilar atelectasis. Apical lordotic projection. Lower cervical ACDF. Monitoring leads project over the chest.  IMPRESSION: Cardiomegaly without failure.   Electronically Signed   By: GDereck LigasM.D.   On: 08/23/2013 20:36   Dg Chest Port 1 View  08/23/2013   CLINICAL DATA:  Left-sided chest pain  EXAM: PORTABLE CHEST - 1 VIEW  COMPARISON:  08/15/2013  FINDINGS: Cardiac shadow remains enlarged. The lungs are clear. Postsurgical changes are noted in the cervical spine. No acute bony abnormality is noted.  IMPRESSION: No acute abnormality seen.  Stable cardiomegaly.  Electronically Signed   By: Inez Catalina M.D.   On: 08/23/2013 18:34    Review of Systems  Constitutional: Positive for malaise/fatigue. Negative for fever, chills and weight loss.  HENT: Negative for ear discharge.   Eyes: Negative for double vision and discharge.  Respiratory: Positive for shortness of breath.   Cardiovascular: Positive for leg swelling. Negative for chest pain and orthopnea.  Gastrointestinal: Negative for nausea, vomiting, diarrhea  and blood in stool.  Genitourinary: Negative for dysuria and flank pain.  Musculoskeletal: Positive for falls. Negative for back pain and myalgias.  Skin: Negative for itching and rash.  Neurological: Positive for dizziness and weakness. Negative for tingling, tremors, sensory change and headaches.  Endo/Heme/Allergies: Negative for polydipsia. Bruises/bleeds easily.  Psychiatric/Behavioral: Negative for depression, suicidal ideas and substance abuse.   Blood pressure 90/56, pulse 42, temperature 98.6 F (37 C), temperature source Oral, resp. rate 18, SpO2 100.00%. Physical Exam  Nursing note and vitals reviewed. Constitutional: He is oriented to person, place, and time. He appears well-developed and well-nourished. No distress.  HENT:  Head: Normocephalic and atraumatic.  Nose: Nose normal.  Mouth/Throat: Oropharynx is clear and moist. No oropharyngeal exudate.  Eyes: Conjunctivae and EOM are normal. Pupils are equal, round, and reactive to light. No scleral icterus.  Neck: Normal range of motion. Neck supple. No thyromegaly present.  JVP 2 cm above clavicle  Cardiovascular: Normal rate, regular rhythm, normal heart sounds and intact distal pulses.  Exam reveals no gallop and no friction rub.   Respiratory: Effort normal and breath sounds normal. No respiratory distress. He has no wheezes. He has no rales.  GI: Soft. Bowel sounds are normal. He exhibits no distension. There is no tenderness. There is no rebound.  Significant ecchymosis just inferior to naval line from left flank to right flank  Musculoskeletal:  Trace edema bilaterally  Neurological: He is alert and oriented to person, place, and time. No cranial nerve deficit. Coordination normal.  Skin: Skin is dry. No rash noted. He is not diaphoretic. No erythema.  lukewarm  Psychiatric: He has a normal mood and affect. His behavior is normal. Thought content normal.   Labs reviewed; Cr 1.58, K 5.4, wbc 18 BNP 9000 (improved from  previous) Troponin unrevealing Urinalysis with many leuks, bacteria, wbc  Problem List Urosepsis Leukocytosis  Chronic Systolic Heart Failure 2v Coronary artery disease  Hypertension, Dyslipidemia  Acute on chronic renal failure  Falls Easy bruising   Assessment/Plan: Mr. Bledsoe is a 63 yo man with PMH of chronic systolic HF, CAD with 2v CAD, not revascularized, HTN, dyslipidemia who is found to have hypotension and UTI concerning for urosepsis. He received some IV fluids in the ER as well as broad spectrum antibiotics and cultures.  - Treat for UTI with IV antibiotics, follow-up blood/urine cultures  - telemetry, trend cardiac biomarkers - hold diuretics; watch volume status closely as may need to restart diuretics sooner than we think (await kidney function return)  - continue aspirin, plavix, atorvastatin  - hold lisinopril 5 mg, spironoloactone 12.5 mg and coreg 12.5 mg bid  - assess volume status in AM, recheck bmp  Todd Jelinski 08/23/2013, 11:50 PM

## 2013-08-23 NOTE — ED Notes (Signed)
PT sent here by Dr Zoila Shutter for doppler bp of 66.  Was sent here from HF clinic (being seen at rehab home for bil leg pain).

## 2013-08-23 NOTE — ED Notes (Signed)
i-stat Trop. Result given to [facility]

## 2013-08-24 DIAGNOSIS — E860 Dehydration: Secondary | ICD-10-CM | POA: Diagnosis present

## 2013-08-24 DIAGNOSIS — N179 Acute kidney failure, unspecified: Secondary | ICD-10-CM

## 2013-08-24 DIAGNOSIS — S301XXA Contusion of abdominal wall, initial encounter: Secondary | ICD-10-CM | POA: Diagnosis present

## 2013-08-24 DIAGNOSIS — G9341 Metabolic encephalopathy: Secondary | ICD-10-CM | POA: Diagnosis present

## 2013-08-24 DIAGNOSIS — S36029A Unspecified contusion of spleen, initial encounter: Secondary | ICD-10-CM | POA: Diagnosis present

## 2013-08-24 DIAGNOSIS — I5022 Chronic systolic (congestive) heart failure: Secondary | ICD-10-CM

## 2013-08-24 LAB — CBC
HCT: 42.8 % (ref 39.0–52.0)
HCT: 44.6 % (ref 39.0–52.0)
HEMATOCRIT: 44.1 % (ref 39.0–52.0)
HEMATOCRIT: 44.7 % (ref 39.0–52.0)
HEMOGLOBIN: 13.6 g/dL (ref 13.0–17.0)
HEMOGLOBIN: 13.8 g/dL (ref 13.0–17.0)
HEMOGLOBIN: 14.2 g/dL (ref 13.0–17.0)
Hemoglobin: 13.9 g/dL (ref 13.0–17.0)
MCH: 26.9 pg (ref 26.0–34.0)
MCH: 27.2 pg (ref 26.0–34.0)
MCH: 27.3 pg (ref 26.0–34.0)
MCH: 27.6 pg (ref 26.0–34.0)
MCHC: 31.2 g/dL (ref 30.0–36.0)
MCHC: 31.3 g/dL (ref 30.0–36.0)
MCHC: 31.8 g/dL (ref 30.0–36.0)
MCHC: 31.8 g/dL (ref 30.0–36.0)
MCV: 85.8 fL (ref 78.0–100.0)
MCV: 86.3 fL (ref 78.0–100.0)
MCV: 87 fL (ref 78.0–100.0)
MCV: 87 fL (ref 78.0–100.0)
PLATELETS: 718 10*3/uL — AB (ref 150–400)
PLATELETS: 745 10*3/uL — AB (ref 150–400)
Platelets: 723 10*3/uL — ABNORMAL HIGH (ref 150–400)
Platelets: 784 10*3/uL — ABNORMAL HIGH (ref 150–400)
RBC: 4.99 MIL/uL (ref 4.22–5.81)
RBC: 5.07 MIL/uL (ref 4.22–5.81)
RBC: 5.14 MIL/uL (ref 4.22–5.81)
RBC: 5.17 MIL/uL (ref 4.22–5.81)
RDW: 19.4 % — AB (ref 11.5–15.5)
RDW: 19.5 % — ABNORMAL HIGH (ref 11.5–15.5)
RDW: 19.6 % — ABNORMAL HIGH (ref 11.5–15.5)
RDW: 19.7 % — ABNORMAL HIGH (ref 11.5–15.5)
WBC: 16 10*3/uL — ABNORMAL HIGH (ref 4.0–10.5)
WBC: 16.1 10*3/uL — AB (ref 4.0–10.5)
WBC: 17 10*3/uL — AB (ref 4.0–10.5)
WBC: 17 10*3/uL — ABNORMAL HIGH (ref 4.0–10.5)

## 2013-08-24 LAB — BASIC METABOLIC PANEL
BUN: 75 mg/dL — AB (ref 6–23)
CHLORIDE: 98 meq/L (ref 96–112)
CO2: 25 mEq/L (ref 19–32)
Calcium: 9.3 mg/dL (ref 8.4–10.5)
Creatinine, Ser: 1.34 mg/dL (ref 0.50–1.35)
GFR calc non Af Amer: 55 mL/min — ABNORMAL LOW (ref >90)
GFR, EST AFRICAN AMERICAN: 64 mL/min — AB (ref >90)
Glucose, Bld: 92 mg/dL (ref 70–99)
Potassium: 5.2 mEq/L (ref 3.7–5.3)
SODIUM: 137 meq/L (ref 137–147)

## 2013-08-24 LAB — TYPE AND SCREEN
ABO/RH(D): A POS
Antibody Screen: NEGATIVE

## 2013-08-24 LAB — ABO/RH: ABO/RH(D): A POS

## 2013-08-24 LAB — CREATININE, URINE, RANDOM: CREATININE, URINE: 79.25 mg/dL

## 2013-08-24 LAB — TROPONIN I

## 2013-08-24 LAB — SODIUM, URINE, RANDOM: Sodium, Ur: 31 mEq/L

## 2013-08-24 MED ORDER — BIOTENE DRY MOUTH MT LIQD
15.0000 mL | Freq: Two times a day (BID) | OROMUCOSAL | Status: DC
  Administered 2013-08-24 – 2013-08-30 (×13): 15 mL via OROMUCOSAL

## 2013-08-24 MED ORDER — ENSURE PUDDING PO PUDG
1.0000 | Freq: Two times a day (BID) | ORAL | Status: DC
  Administered 2013-08-24 – 2013-08-30 (×12): 1 via ORAL

## 2013-08-24 NOTE — Progress Notes (Signed)
Clinical Social Work Department BRIEF PSYCHOSOCIAL ASSESSMENT 08/24/2013  Patient:  Fernando Lane, Fernando Lane     Account Number:  [id number]     Admit date:  08/23/2013  Clinical Social Worker:  Freeman Caldron  Date/Time:  08/24/2013 02:43 PM  Referred by:  Physician  Date Referred:  08/24/2013 Referred for  SNF Placement   Other Referral:   Interview type:  Patient Other interview type:    PSYCHOSOCIAL DATA Living Status:  FACILITY Admitted from facility:  [facility] Level of care:  [place] Primary support name:  Fernando Lane [phone]) Primary support relationship to patient:  CHILD, ADULT Degree of support available:   Good--pt has support from adult children.    CURRENT CONCERNS Current Concerns  Post-Acute Placement   Other Concerns:    SOCIAL WORK ASSESSMENT / PLAN CSW introduced self and asked pt about his experience at [facility]. Pt states he did not have a good experience and does not want to return. CSW explained PT/OT will likely re-evaluate him now that he is back in the hospital, and that [facility] will explain options for home health vs SNF. Pt thanked CSW for support, and CSW is following to assist with discharge planning.   Assessment/plan status:  Psychosocial Support/Ongoing Assessment of Needs Other assessment/ plan:   Information/referral to community resources:   Home health vs SNF    PATIENT'S/FAMILY'S RESPONSE TO PLAN OF CARE: Good--pt thanked CSW for support and is understanding of CSW role.       Ky Barban, MSW, [facility] Clinical Social Worker [phone]

## 2013-08-24 NOTE — Progress Notes (Signed)
Utilization review completed. Stanisha Lorenz, RN, BSN. 

## 2013-08-24 NOTE — Progress Notes (Signed)
Patient ID: Fernando Lane, male   DOB: April 09, 1950, 63 y.o.   MRN: [id number]    SUBJECTIVE: Patient feels a little disoriented today but not having abdominal pain and not lightheaded.  SBP in 90s this morning.  He has UTI, possible hypotension due to urosepsis.   Scheduled Meds: . aspirin  81 mg Oral Daily  . atorvastatin  80 mg Oral q1800  . cefTRIAXone (ROCEPHIN)  IV  1 g Intravenous QHS  . clopidogrel  75 mg Oral Q breakfast  . colchicine  0.6 mg Oral Daily  . folic acid  1 mg Oral Daily  . gabapentin  300 mg Oral TID  . sodium chloride  3 mL Intravenous Q12H  . thiamine  100 mg Oral Daily   Continuous Infusions:   PRN Meds:.acetaminophen, acetaminophen, ondansetron (ZOFRAN) IV, ondansetron, oxyCODONE-acetaminophen    Filed Vitals:   08/24/13 0302 08/24/13 0400 08/24/13 0500 08/24/13 0600  BP: 88/55 87/68 89/57  98/57  Pulse: 79 81 78 80  Temp: 98.2 F (36.8 C)     TempSrc: Oral     Resp: 21 25 26 19   Height:      Weight: 98.4 kg (216 lb 14.9 oz)     SpO2: 92% 82% 93% 96%    Intake/Output Summary (Last 24 hours) at 08/24/13 0732 Last data filed at 08/24/13 0014  Gross per 24 hour  Intake    730 ml  Output    200 ml  Net    530 ml    LABS: Basic Metabolic Panel:  Recent Labs  08/23/13 1758  NA 131*  K 5.4*  CL 93*  CO2 24  GLUCOSE 104*  BUN 79*  CREATININE 1.58*  CALCIUM 9.1   Liver Function Tests:  Recent Labs  08/23/13 1758  AST 34  ALT 46  ALKPHOS 204*  BILITOT 0.9  PROT 6.7  ALBUMIN 2.8*   No results found for this basename: LIPASE, AMYLASE,  in the last 72 hours CBC:  Recent Labs  08/23/13 1758 08/24/13 0037 08/24/13 0354  WBC 18.1* 16.0* 16.1*  NEUTROABS 15.7*  --   --   HGB 14.2 13.9 13.6  HCT 44.6 44.6 42.8  MCV 85.9 86.3 85.8  PLT 759* 745* 718*   Cardiac Enzymes:  Recent Labs  08/23/13 1819 08/24/13 0037  TROPONINI <0.30 <0.30   BNP: No components found with this basename: POCBNP,  D-Dimer: No results found  for this basename: DDIMER,  in the last 72 hours Hemoglobin A1C: No results found for this basename: HGBA1C,  in the last 72 hours Fasting Lipid Panel: No results found for this basename: CHOL, HDL, LDLCALC, TRIG, CHOLHDL, LDLDIRECT,  in the last 72 hours Thyroid Function Tests: No results found for this basename: TSH, T4TOTAL, FREET3, T3FREE, THYROIDAB,  in the last 72 hours Anemia Panel: No results found for this basename: VITAMINB12, FOLATE, FERRITIN, TIBC, IRON, RETICCTPCT,  in the last 72 hours   PHYSICAL EXAM General: NAD Neck: Thick, JVP 10-12 cm, no thyromegaly or thyroid nodule.  Lungs: Clear to auscultation bilaterally with normal respiratory effort. CV: Nondisplaced PMI.  Heart regular S1/S2, no S3/S4, no murmur. 1+ ankle edema bilaterally.  Toes on L and right feet dusky; L foot cool and no palpable pulse, R foot warm and palpable pulse Abdomen: Soft, mild LUQ tenderness, no hepatosplenomegaly, no distention.  Neurologic: Alert and oriented x 3.  Psych: Normal affect. Extremities: No clubbing or cyanosis.   ASSESSMENT AND PLAN: 63 yo with history of  polycythemia vera, HTN, and OSA presented with probable inferior MI recently with possible earlier anterior MI as well as evidence for ischemic foot (dusky toes) and acute systolic CHF.  Echo showed evidence for RV infarction and pericardial effusion.  He was treated medically and discharged to a SNF.  He returned to hospital from [facility] Thursday with hypotension and probable urosepsis.  He also had a fall with possible splenic contusion.  1. CAD: Recent out of hospital MI with totally occluded mid LAD and mid RCA, no collaterals.  RV infarction on echo.  Was medically managed. Given evidence for RV infarction, suspect RCA occlusion was acute event and LAD was a chronic occlusion.  Unfortunately does not have a lot of good options.  Not PCI candidate and no targets for CABG.  RV looked bad, given this and his comorbidities would not be an  LVAD candidate.   - Continue ASA, statin, Plavix.  2. Acute systolic CHF with ischemic cardiomyopathy:  EF 20% on LV-gram, LVEDP 29 mmHg. ECHO from last admission showed EF 30-35% with akinetic apex, septal wall, and inferior wall.  RV moderately dilated and with severely reduced systolic function (consistent with RV infarction).  SBP 60s yesterday so patient is now off Coreg, lisinopril, spironolactone, and Lasix (suspect urosepsis in the face of ongoing use of BP-active meds and diuresis).  BP improved today, JVP elevated.  Would not give further IV fluid.  3. OSA: CPAP at night.  4. PAD: Ischemic toes on right foot. Peripheral angiogram with bilateral occluded PTs, no intervention. Follow clinically.  This has significantly inhibited his ability to walk.  5. 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  He had early tamponade on echo and given hypotension is s/p pericardiocentesis. Pleuritic chest pain has resolved. Planned for colchicine x 3 months.  6. Urosepsis: UA suggestive of UTI.  Suspect low BP was due to a combination of urosepsis with use of BP-active meds and diuresis with Lasix.  WBCs elevated.  He is on ceftriaxone, cultures pending.  7. Fall with possible splenic contusion: No further workup per trauma, ok to stay on ASA and Plavix.  8. AKI: Elevated creatinine in setting of hypotension/urosepsis and also got contrast with CT in ER yesterday.  I am concerned that he may have a significant creatinine rise.    Loralie Champagne 08/24/2013 7:32 AM

## 2013-08-24 NOTE — Progress Notes (Signed)
[facility]  Fernando Lane PPJ:[id number] DOB: [birth year] DOA: 08/23/2013 PCP: Gwendolyn Grant, MD  Time spent :  35 mins  Brief narrative: 63 y.o. male with history of polycythemia vera, recent MI post cardiac cath (found to have two-vessel disease with EF of 20% which is managed medically). recent pericardiocentesis for effusion due to pericarditis, and chronic right leg stasis ulcers managed conservatively who went for routine followup with his Cardiologist when he was found to have systolic BPs in the 58K.  The patient was transferred to [facility].   After arrival to the ER the patient reported feeling weak and dizzy. He did have a fall at his nursing home prior to arrival during transfer to his wheelchair. Patient did not lose consciousness or hit his head after his fall but had some bruising on his abdomen. CT abdomen and pelvis showed contusions of his spleen and a rib fracture. Patient otherwise denied any chest pain or shortness of breath, nausea, vomiting or diarrhea. Patient's blood pressure did improve with IV boluses. UA showed features concerning for UTI so he was placed on antibiotics. Blood cultures were obtained.  HPI/Subjective: Pt very lethargic and only briefly awakens.  Assessment/Plan:  Hypotension/Dehydration -BP soft so cont IVFs and hold offending meds (Cards rec no IVFs but pt lethargic and not eating/drinking so watch closely)  Metabolic encephalopathy -due to recent hypotension, infection and ARF(azotemia) -also suspect accumulated Neurontin in setting of ARF so will hold for now  Urinary tract infection -urine cx pending -cont empiric anbx's  Acute renal failure -multifactorial: dehydration, ATN due to ACE I and diuretics, hypotension, and IV contrast for CT scan -follow labs -hold above meds and hydrate -dc Colchicine for now (was to be on for 3 months to treat pericarditis)  Abdominal wall  contusion/Contusion of spleen -appreciate Trauma service assistance -stable on exam and no evidence of bleeding on CT -hgb stable -Trauma has signed off -OK per Trauma to resume anticoagulation    POLYCYTHEMIA VERA -hgb and platelets at baseline  HYPERTENSION -BP low at this time -BB on hold so monitor for tachycardia from BB withdrawal  CORONARY ARTERY DISEASE / recent STEMI / recent pericardial effusion post pericardiocentesis -per Cards -cont Plavix, ASA, statin  Chronic systolic heart failure / ischemic CM -per Cards -currently dry and compensated  OBSTRUCTIVE SLEEP APNEA  ? PERIPHERAL VASCULAR DISEASE/Leg ulcer -has ulcer on RLE-ABIs/arterial duplex 08/08/13 without evidence of stenosis, occlusion, or aneurysm bilaterally. VVS eval last admit and if PAIN uncontrollable then only option would be amputation -due to pain unable to tolerate SCD to RLE -consult WOC RN  DVT prophylaxis: SCDs Code Status: Full Family Communication: Spoke at length with patient's son Heath Lark Disposition Plan/Expected LOS: Step down  Consultants: Cardiology Trauma service  Procedures: None  Antibiotics: Rocephin 6/11 >>>  Objective: Blood pressure 131/95, pulse 98, temperature 97.6 F (36.4 C), temperature source Oral, resp. rate 24, height 5\' 5"  (1.651 m), weight 98.4 kg (216 lb 14.9 oz), SpO2 94.00%.  Intake/Output Summary (Last 24 hours) at 08/24/13 1743 Last data filed at 08/24/13 0014  Gross per 24 hour  Intake    730 ml  Output    200 ml  Net    530 ml   Exam: General: No acute respiratory distress - lethargic Lungs: Clear to auscultation bilaterally without wheezes or crackles, RA Cardiovascular: Regular rate and rhythm without murmur gallop or rub normal S1 and S2, trace bilateral lower  extremity peripheral edema  Abdomen: Nontender, nondistended, soft, bowel sounds positive, no rebound, no ascites, no appreciable mass Musculoskeletal: No significant cyanosis, clubbing of  bilateral lower extremities-good stasis ulcer right calf   Scheduled Meds:  Scheduled Meds: . antiseptic oral rinse  15 mL Mouth Rinse BID  . aspirin  81 mg Oral Daily  . atorvastatin  80 mg Oral q1800  . cefTRIAXone (ROCEPHIN)  IV  1 g Intravenous QHS  . clopidogrel  75 mg Oral Q breakfast  . feeding supplement (ENSURE)  1 Container Oral BID BM  . folic acid  1 mg Oral Daily  . sodium chloride  3 mL Intravenous Q12H  . thiamine  100 mg Oral Daily   Data Reviewed: Basic Metabolic Panel:  Recent Labs Lab 08/23/13 1758 08/24/13 1315  NA 131* 137  K 5.4* 5.2  CL 93* 98  CO2 24 25  GLUCOSE 104* 92  BUN 79* 75*  CREATININE 1.58* 1.34  CALCIUM 9.1 9.3   Liver Function Tests:  Recent Labs Lab 08/23/13 1758  AST 34  ALT 46  ALKPHOS 204*  BILITOT 0.9  PROT 6.7  ALBUMIN 2.8*   CBC:  Recent Labs Lab 08/23/13 1758 08/24/13 0037 08/24/13 0354 08/24/13 0827 08/24/13 1130  WBC 18.1* 16.0* 16.1* 17.0* 17.0*  NEUTROABS 15.7*  --   --   --   --   HGB 14.2 13.9 13.6 13.8 14.2  HCT 44.6 44.6 42.8 44.1 44.7  MCV 85.9 86.3 85.8 87.0 87.0  PLT 759* 745* 718* 723* 784*   Cardiac Enzymes:  Recent Labs Lab 08/23/13 1819 08/24/13 0037  TROPONINI <0.30 <0.30   BNP (last 3 results)  Recent Labs  08/07/13 1600 08/23/13 1759  PROBNP 13375.0* 9383.0*   CBG:  Recent Labs Lab 08/23/13 1748 08/23/13 2225  GLUCAP 94 108*    No results found for this or any previous visit (from the past 240 hour(s)).   Studies:  Recent x-ray studies have been reviewed in detail by the Attending Physician  Erin Hearing, ANP [facility] Office  [phone] Pager [phone]  **If unable to reach the above provider after paging please contact the [place] @ [phone]  On-Call/Text Page:      [email or web address]      password [email or web address]  If 7PM-7AM, please contact night-coverage [email or web address] Password [facility] 08/24/2013, 5:43 PM   LOS: 1 day   I have personally examined  this patient and reviewed the entire database. I have reviewed the above note, made any necessary editorial changes, and agree with its content.  Cherene Altes, MD [facility]

## 2013-08-24 NOTE — Progress Notes (Signed)
INITIAL NUTRITION ASSESSMENT  DOCUMENTATION CODES Per approved criteria  -Obesity Unspecified   INTERVENTION: Ensure Complete po BID, each supplement provides 350 kcal and 13 grams of protein.  NUTRITION DIAGNOSIS: Inadequate oral intake related to chronic illness as evidenced by reported intake less than estimated needs and wt loss.   Goal: Pt to meet >/= 90% of their estimated nutrition needs   Monitor:  Wt, po intake, acceptance of supplements, labs, I/O's  Reason for Assessment: MST  63 y.o. male  Admitting Dx: Hypotension  ASSESSMENT: 63 y.o. male with history of polycythemia vera, recent MI had cardiac catheter and was found to have two-vessel disease with EF of 20% managed medically, recent pericardiocentesis after patient had peripheral effusion from pericarditis, right lower leg ischemia manage conservatively has recent mammogram showed blood flow and any worsening vascular surgery is planning amputation, OSA, hypertension, hyperlipidemia had gone for his regular followup to his cardiologist when patient was found to have low blood pressure in the systolic 93Z and patient was transferred to [place] reports having a poor appetite for past month. - Pt reports wt loss. Per chart history, pt has 22 lbs in the past 6 months. - Pt's current meal completion is 100% - No signs of fat and muscle wasting  Height: Ht Readings from Last 1 Encounters:  08/24/13 5\' 5"  (1.651 m)    Weight: Wt Readings from Last 1 Encounters:  08/24/13 216 lb 14.9 oz (98.4 kg)    Ideal Body Weight: 61.5 kg  % Ideal Body Weight: 160%  Wt Readings from Last 10 Encounters:  08/24/13 216 lb 14.9 oz (98.4 kg)  08/17/13 214 lb 8.1 oz (97.3 kg)  08/17/13 214 lb 8.1 oz (97.3 kg)  08/17/13 214 lb 8.1 oz (97.3 kg)  03/02/13 238 lb 1.6 oz (108.001 kg)  01/24/13 230 lb (104.327 kg)  01/24/13 230 lb (104.327 kg)  12/20/12 233 lb (105.688 kg)  12/20/12 233 lb 12.8 oz (106.051 kg)   06/26/12 253 lb 12.8 oz (115.123 kg)    Usual Body Weight: 238 lbs 6 mos ago  % Usual Body Weight: 91%  BMI:  Body mass index is 36.1 kg/(m^2).  Estimated Nutritional Needs: Kcal: 1850-2150 Protein: 100-110 g Fluid: 1.9-2.2 L/day  Skin: incision on buttocks, incision on sacrum, diabetic ulcers on left and right legs  Diet Order: Cardiac  EDUCATION NEEDS: -Education needs addressed   Intake/Output Summary (Last 24 hours) at 08/24/13 1245 Last data filed at 08/24/13 0014  Gross per 24 hour  Intake    730 ml  Output    200 ml  Net    530 ml    Last BM: 6/12   Labs:   Recent Labs Lab 08/23/13 1758  NA 131*  K 5.4*  CL 93*  CO2 24  BUN 79*  CREATININE 1.58*  CALCIUM 9.1  GLUCOSE 104*    CBG (last 3)   Recent Labs  08/23/13 1748 08/23/13 2225  GLUCAP 94 108*    Scheduled Meds: . antiseptic oral rinse  15 mL Mouth Rinse BID  . aspirin  81 mg Oral Daily  . atorvastatin  80 mg Oral q1800  . cefTRIAXone (ROCEPHIN)  IV  1 g Intravenous QHS  . clopidogrel  75 mg Oral Q breakfast  . colchicine  0.6 mg Oral Daily  . folic acid  1 mg Oral Daily  . gabapentin  300 mg Oral TID  . sodium chloride  3 mL Intravenous Q12H  .  thiamine  100 mg Oral Daily    Continuous Infusions:   Past Medical History  Diagnosis Date  . Chronic low back pain   . 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 Chronic neck pain   . OSA (obstructive sleep apnea)     CPAP @ bedtime  . Allergy   . Unspecified essential hypertension     Resistant, 2D Echo - EF-55-60  . Polycythemia   . CAD (coronary artery disease)     Vessel type unspecified  . PVD (peripheral vascular disease)   . DDD (degenerative disc disease), cervical     Past Surgical History  Procedure Laterality Date  . Lower extrmity doppler      [place] 2008; no evidence for PAD  . US echocardiography  11/2008    Normal valves, mild LAE  . Left knee replacement  10/2006  .  Cervical spine surgery  02/2008  . Joint replacement    . Spine surgery      Terrace Arabia RD, LDN

## 2013-08-24 NOTE — Progress Notes (Signed)
Trauma Service Note  Subjective: Patient looks wiped out at rest.  Still has some abdominal pai and discomfort, but this ismostly away from the splenic area, and around areas of abdominal wall contusions.  Smells of urine  Objective: Vital signs in last 24 hours: Temp:  [97.6 F (36.4 C)-98.6 F (37 C)] 98.2 F (36.8 C) (06/12 0302) Pulse Rate:  [42-84] 80 (06/12 0600) Resp:  [15-30] 19 (06/12 0600) BP: (64-98)/(44-74) 98/57 mmHg (06/12 0600) SpO2:  [82 %-100 %] 96 % (06/12 0600) Weight:  [98.4 kg (216 lb 14.9 oz)] 98.4 kg (216 lb 14.9 oz) (06/12 0302) Last BM Date: 08/23/13  Intake/Output from previous day: 06/11 0701 - 06/12 0700 In: 730 [P.O.:120; I.V.:610] Out: 200 [Urine:200] Intake/Output this shift:    General: Short of breath at rest, but states that he feels better.  Lungs: Decreased breath sounds bilaterally.  Abd: Distended, but not tense, good bowel sounds.  Extremities: No changes  Neuro: Intact  Lab Results: CBC   Recent Labs  08/24/13 0037 08/24/13 0354  WBC 16.0* 16.1*  HGB 13.9 13.6  HCT 44.6 42.8  PLT 745* 718*   BMET  Recent Labs  08/23/13 1758  NA 131*  K 5.4*  CL 93*  CO2 24  GLUCOSE 104*  BUN 79*  CREATININE 1.58*  CALCIUM 9.1   PT/INR  Recent Labs  08/23/13 1758  LABPROT 15.3*  INR 1.24   ABG No results found for this basename: PHART, PCO2, PO2, HCO3,  in the last 72 hours  Studies/Results: Dg Lumbar Spine 2-3 Views  08/23/2013   CLINICAL DATA:  Right hip pain and low back pain after a fall.  EXAM: LUMBAR SPINE - 2-3 VIEW  COMPARISON:  CT abdomen and pelvis 08/23/2013  FINDINGS: Technically limited study due to underpenetration. Lumbar spine demonstrates normal alignment without significant anterior subluxation. Diffuse degenerative changes with narrowed lumbar interspaces and associated endplate hypertrophic changes. No vertebral compression deformities or displaced fractures are suggested.  IMPRESSION: Degenerative  changes in the lumbar spine. No displaced fractures identified.   Electronically Signed   By: Lucienne Capers M.D.   On: 08/23/2013 23:39   Dg Pelvis 1-2 Views  08/23/2013   CLINICAL DATA:  Right hip pain after a fall.  EXAM: PELVIS - 1-2 VIEW  COMPARISON:  CT abdomen and pelvis 08/23/2013  FINDINGS: Degenerative changes in both hips. No evidence of acute fracture or dislocation of the pelvis, right hip, or left hip. SI joints and symphysis pubis are not displaced. No focal bone lesions are appreciated.  IMPRESSION: Degenerative changes.  No displaced fractures identified.   Electronically Signed   By: Lucienne Capers M.D.   On: 08/23/2013 23:40   Dg Sacrum/coccyx  08/23/2013   CLINICAL DATA:  Right hip pain after a fall.  EXAM: SACRUM AND COCCYX - 2+ VIEW  COMPARISON:  CT abdomen and pelvis 08/23/2013  FINDINGS: Residual contrast material in the bladder from recent CT scan. This obscures visualization of the sacrum on the AP view. On the lateral view, the sacrum appears grossly intact without displacement demonstrated. No destructive bone lesions are appreciated.  IMPRESSION: Visualization of the sacrum is limited due to contrast material in the bladder. No displaced fractures are demonstrated.   Electronically Signed   By: Lucienne Capers M.D.   On: 08/23/2013 23:38   Ct Abdomen Pelvis W Contrast  08/23/2013   CLINICAL DATA:  Fall during physical therapy today. Bruising around the abdomen.  EXAM: CT ABDOMEN AND PELVIS  WITH CONTRAST  TECHNIQUE: Multidetector CT imaging of the abdomen and pelvis was performed using the standard protocol following bolus administration of intravenous contrast.  CONTRAST:  147mL OMNIPAQUE IOHEXOL 300 MG/ML  SOLN  COMPARISON:  None.  FINDINGS: Bones: Nondisplaced left ninth lateral rib fracture is present. Congenitally narrow spinal canal. Moderate thoracolumbar spondylosis. Thoracolumbar vertebral body height is preserved. Pelvic rings appear intact. Both hips are located.   Lung Bases: Small right pleural effusion. Associated atelectasis. Coronary artery atherosclerosis.  Liver:  Liver appears within normal limits.  No perihepatic fluid.  Spleen: Splenomegaly is present with 17 cm splenic span. There are scattered areas of areas of low attenuation in the inferior spleen compatible with splenic contusion. There is no pseudoaneurysm or perisplenic blood the suggest laceration. Splenic infarct is considered less likely although it could have this appearance. The present 7 adjacent rib fracture suggest trauma as the mechanism.  Gallbladder:  Contracted.  Common bile duct:  Normal.  Pancreas:  Normal.  Adrenal glands: Small nodule adjacent to the left adrenal gland is most compatible with a splenuculus.  Kidneys: Nonspecific bilateral perinephric stranding. 1 cm left upper pole renal cysts. Both ureters appear within normal limits. There is no delayed excretion of contrast following a 3 minutes delay, which may be secondary to poor renal function or contrast induced nephropathy. 4 mm nonobstructing calculus in the left inferior renal pole.  Stomach:  Collapsed.  Small hiatal hernia.  Small bowel:  Normal.  Colon:   Colonic diverticulosis.  No diverticulitis.  Pelvic Genitourinary: Thickening of the urinary bladder the degree of distension, raising the possibility of chronic bladder outflow obstruction.  Vasculature: Atherosclerosis. No acute vascular abnormality. No extravasation of contrast.  Body Wall: Fat containing periumbilical hernia. Stranding along both flanks, greater on the right than left which may represent contusion or subcutaneous edema.  IMPRESSION: 1. Multiple areas of low attenuation in an enlarged spleen. These are compatible with splenic contusions. Splenic infarcts considered less likely. Nearby nondisplaced left lateral ninth rib fracture. No perisplenic fluid to suggest laceration and no extravasation of contrast. 2. Splenomegaly with 17 cm splenic span. 3. No delayed  excretion of contrast from the kidneys suggesting contrast induced nephropathy. This could also be associated with chronic renal insufficiency. Recommend assessment of renal function. 4. Small right pleural effusion with associated atelectasis. 5. Stranding in the body wall along both flanks, right greater than left suggesting contusion.   Electronically Signed   By: Dereck Ligas M.D.   On: 08/23/2013 20:31   Dg Chest Port 1 View  08/23/2013   CLINICAL DATA:  Hypotension.  EXAM: PORTABLE CHEST - 1 VIEW  COMPARISON:  08/23/2013.  02/21/2013.  FINDINGS: Cardiomegaly. No airspace disease or effusion. Basilar atelectasis. Apical lordotic projection. Lower cervical ACDF. Monitoring leads project over the chest.  IMPRESSION: Cardiomegaly without failure.   Electronically Signed   By: Dereck Ligas M.D.   On: 08/23/2013 20:36   Dg Chest Port 1 View  08/23/2013   CLINICAL DATA:  Left-sided chest pain  EXAM: PORTABLE CHEST - 1 VIEW  COMPARISON:  08/15/2013  FINDINGS: Cardiac shadow remains enlarged. The lungs are clear. Postsurgical changes are noted in the cervical spine. No acute bony abnormality is noted.  IMPRESSION: No acute abnormality seen.  Stable cardiomegaly.   Electronically Signed   By: Inez Catalina M.D.   On: 08/23/2013 18:34    Anti-infectives: Anti-infectives   Start     Dose/Rate Route Frequency Ordered Stop   08/24/13 2200  cefTRIAXone (ROCEPHIN) 1 g in dextrose 5 % 50 mL IVPB     1 g 100 mL/hr over 30 Minutes Intravenous Daily at bedtime 08/23/13 2336     08/23/13 1930  cefTRIAXone (ROCEPHIN) 1 g in dextrose 5 % 50 mL IVPB     1 g 100 mL/hr over 30 Minutes Intravenous  Once 08/23/13 1929 08/23/13 2203      Assessment/Plan: s/p  Advance diet No concern about bleeding from spleen.  Patient is on ASA and Plavix.   Okay to resume anticoagulation and ambulation. Trauma will sign off.  Call us if needed.  LOS: 1 day   Kathryne Eriksson. Dahlia Bailiff, MD, FACS [phone] Trauma  Surgeon 08/24/2013

## 2013-08-24 NOTE — Consult Note (Addendum)
WOC wound consult note Reason for Consult: Consult requested for leg wounds.  Pt is familiar to [facility] team from previous admission; refer to [facility]ogress note from 5/28. All wounds have greatly improved at this time. Pt was previously using Santyl ointment to chemically debride nonviable tissue.  This topical treatment is no longer needed for wounds. Wound type:  Full thickness to left knee and right calf. Measurement: Left knee 1.2X2X.1cm, 100% red and moist.  Small amt yellow drainage, no odor. Right calf with 2 wounds; 4.3X6X.1cm and 1.3X.4X.1cm Wound bed:100% red and moist.   Drainage (amount, consistency, odor) Mod amt yellow drainage, no odor Periwound: Intact skin surrounding Dressing procedure/placement/frequency: Foam dressing to absorb drainage and promote healing.  Discussed plan of care with patient and Santyl use is discontinued.  He verbalizes understanding. Please re-consult if further assistance is needed.  Thank-you,  Julien Girt MSN, [place], [place], [place], [place]

## 2013-08-25 LAB — BASIC METABOLIC PANEL
BUN: 71 mg/dL — ABNORMAL HIGH (ref 6–23)
CHLORIDE: 97 meq/L (ref 96–112)
CO2: 24 mEq/L (ref 19–32)
Calcium: 9.2 mg/dL (ref 8.4–10.5)
Creatinine, Ser: 1.25 mg/dL (ref 0.50–1.35)
GFR calc Af Amer: 69 mL/min — ABNORMAL LOW (ref >90)
GFR calc non Af Amer: 60 mL/min — ABNORMAL LOW (ref >90)
GLUCOSE: 117 mg/dL — AB (ref 70–99)
POTASSIUM: 4.9 meq/L (ref 3.7–5.3)
Sodium: 135 mEq/L — ABNORMAL LOW (ref 137–147)

## 2013-08-25 LAB — CBC
HCT: 42.4 % (ref 39.0–52.0)
HEMOGLOBIN: 13.2 g/dL (ref 13.0–17.0)
MCH: 27.2 pg (ref 26.0–34.0)
MCHC: 31.1 g/dL (ref 30.0–36.0)
MCV: 87.2 fL (ref 78.0–100.0)
Platelets: 749 10*3/uL — ABNORMAL HIGH (ref 150–400)
RBC: 4.86 MIL/uL (ref 4.22–5.81)
RDW: 19.9 % — ABNORMAL HIGH (ref 11.5–15.5)
WBC: 16.9 10*3/uL — AB (ref 4.0–10.5)

## 2013-08-25 LAB — URINE CULTURE

## 2013-08-25 NOTE — Progress Notes (Signed)
Placed pt. On cpap. Pt. Is tolerating well at this time.  

## 2013-08-25 NOTE — Progress Notes (Signed)
[address] - Stepdown / ICU Progress Note  Fernando Lane QIW:[id number] DOB: Jun 29, 1950 DOA: 08/23/2013 PCP: Gwendolyn Grant, MD  Brief narrative: 63 y.o. male with history of polycythemia vera, recent MI post cardiac cath (found to have two-vessel disease with EF of 20% which is managed medically), recent pericardiocentesis for effusion due to pericarditis, and chronic right leg stasis ulcers managed conservatively who went for routine followup with his Cardiologist when he was found to have systolic BPs in the 41D.  The patient was transferred to [facility].   After arrival to the ER the patient reported feeling weak and dizzy. He did have a fall at his nursing home prior to arrival during transfer to his wheelchair. Patient did not lose consciousness or hit his head after his fall but had some bruising on his abdomen. CT abdomen and pelvis showed contusions of his spleen and a rib fracture. Patient otherwise denied any chest pain or shortness of breath, nausea, vomiting or diarrhea. Patient's blood pressure did improve with IV boluses. UA showed features concerning for UTI so he was placed on antibiotics. Blood cultures were obtained.  HPI/Subjective: Pt is much more alert today.  C/o chronic pain in legs, R>L.  Denies cp, sob, f/c, or abdom pain.   Assessment/Plan:  Hypotension/Dehydration BP is slowly improving - resuscitation efforts limited by signif systolic CHF - cont to hold BP meds   Metabolic encephalopathy due to hypotension, infection, ARF(azotemia), and probable accumulated Neurontin in setting of ARF - much improved today   Urinary tract infection - Gram neg rods  await culture results - cont empiric anbx's  Acute renal failure multifactorial: dehydration, ATN due to ACE I and diuretics, hypotension, and IV contrast for CT scan - slowly improving w/ volume expansion - follow - dc Colchicine for now (was to be on for 3 months to treat  pericarditis)  Abdominal wall contusion / Contusion of spleen appreciate Trauma service assistance - stable on exam and no evidence of bleeding on CT - Hgb stable - OK per Trauma to resume anticoagulation    POLYCYTHEMIA VERA w/ thrombocytosis  Hgb and platelets at baseline  Hx of HYPERTENSION BP low at this time - BB on hold   CORONARY ARTERY DISEASE / recent STEMI / recent pericardial effusion post pericardiocentesis per Cards - cont Plavix, ASA, statin  Chronic systolic heart failure / ischemic CM per Cards - currently well compensated  OBSTRUCTIVE SLEEP APNEA  ? PERIPHERAL VASCULAR DISEASE/Leg ulcer ulcer on RLE - ABIs/arterial duplex 08/08/13 without evidence of stenosis, occlusion, or aneurysm bilaterally - VVS eval last admit and if pain uncontrollable only option would be amputation - due to pain unable to tolerate SCD to RLE - WOC RN has seen   DVT prophylaxis: SCDs Code Status: Full Family Communication: no family present at time of exam  Disposition Plan/Expected LOS: Step down  Consultants: Cardiology Trauma service  Procedures: None  Antibiotics: Rocephin 6/11 >>  Objective: Blood pressure 94/65, pulse 77, temperature 98.6 F (37 C), temperature source Oral, resp. rate 19, height 5\' 5"  (1.651 m), weight 99.2 kg (218 lb 11.1 oz), SpO2 91.00%.  Intake/Output Summary (Last 24 hours) at 08/25/13 1002 Last data filed at 08/25/13 0959  Gross per 24 hour  Intake    533 ml  Output    250 ml  Net    283 ml   Exam: General: No acute respiratory distress - alert and oriented  Lungs: Clear to auscultation bilaterally  without wheezes or crackles Cardiovascular: Regular rate and rhythm without murmur gallop or rub, trace bilateral lower extremity peripheral edema  Abdomen: Nontender, nondistended, soft, bowel sounds positive, no rebound, no ascites, no appreciable mass - ecchymosis B lower abdom quad/flanks w/o change  Musculoskeletal: No significant cyanosis,  clubbing of bilateral lower extremities - R LE wound dressed  Scheduled Meds:  Scheduled Meds: . antiseptic oral rinse  15 mL Mouth Rinse BID  . aspirin  81 mg Oral Daily  . atorvastatin  80 mg Oral q1800  . cefTRIAXone (ROCEPHIN)  IV  1 g Intravenous QHS  . clopidogrel  75 mg Oral Q breakfast  . feeding supplement (ENSURE)  1 Container Oral BID BM  . folic acid  1 mg Oral Daily  . sodium chloride  3 mL Intravenous Q12H  . thiamine  100 mg Oral Daily   Data Reviewed: Basic Metabolic Panel:  Recent Labs Lab 08/23/13 1758 08/24/13 1315 08/25/13 0325  NA 131* 137 135*  K 5.4* 5.2 4.9  CL 93* 98 97  CO2 24 25 24   GLUCOSE 104* 92 117*  BUN 79* 75* 71*  CREATININE 1.58* 1.34 1.25  CALCIUM 9.1 9.3 9.2   Liver Function Tests:  Recent Labs Lab 08/23/13 1758  AST 34  ALT 46  ALKPHOS 204*  BILITOT 0.9  PROT 6.7  ALBUMIN 2.8*   CBC:  Recent Labs Lab 08/23/13 1758 08/24/13 0037 08/24/13 0354 08/24/13 0827 08/24/13 1130 08/25/13 0325  WBC 18.1* 16.0* 16.1* 17.0* 17.0* 16.9*  NEUTROABS 15.7*  --   --   --   --   --   HGB 14.2 13.9 13.6 13.8 14.2 13.2  HCT 44.6 44.6 42.8 44.1 44.7 42.4  MCV 85.9 86.3 85.8 87.0 87.0 87.2  PLT 759* 745* 718* 723* 784* 749*   Cardiac Enzymes:  Recent Labs Lab 08/23/13 1819 08/24/13 0037  TROPONINI <0.30 <0.30   BNP (last 3 results)  Recent Labs  08/07/13 1600 08/23/13 1759  PROBNP 13375.0* 9383.0*   CBG:  Recent Labs Lab 08/23/13 1748 08/23/13 2225  GLUCAP 94 108*    Recent Results (from the past 240 hour(s))  URINE CULTURE     Status: None   Collection Time    08/23/13  6:35 PM      Result Value Ref Range Status   Specimen Description URINE, CLEAN CATCH   Final   Special Requests NONE   Final   Culture  Setup Time     Final   Value: 08/24/2013 00:35     Performed at [place]     Final   Value: >=100,000 COLONIES/ML     Performed at [facility]   Culture     Final    Value: [place]     Performed at [facility]   Report Status PENDING   Incomplete     Studies:  Recent x-ray studies have been reviewed in detail by the Attending Physician  Time spent :  35 mins  Cherene Altes, MD [facility] For Consults/Admissions - Flow Manager - [phone] Office  [phone] Pager [phone]  On-Call/Text Page:      [email or web address]      password [facility]  08/25/2013, 10:02 AM   LOS: 2 days

## 2013-08-25 NOTE — Progress Notes (Signed)
Subjective:  Complains of mild abdominal pain today, no shortness of breath or chest pain  Objective:  Vital Signs in the last 24 hours: BP 94/65  Pulse 77  Temp(Src) 98.6 F (37 C) (Oral)  Resp 19  Ht 5\' 5"  (1.651 m)  Wt 99.2 kg (218 lb 11.1 oz)  BMI 36.39 kg/m2  SpO2 91%  Physical Exam: Pleasant obese male in no acute distress. Lungs:  Clear  Cardiac:  Regular rhythm, normal S1 and S2, no S3  jugular venous distention noted Abdomen:  No tenderness in epigastric area  Extremities:  1+ edema noted, dusky toes with left foot cool  Intake/Output from previous day: 06/12 0701 - 06/13 0700 In: 293 [P.O.:240; I.V.:3; IV Piggyback:50] Out: 250 [Urine:250] Weight Filed Weights   08/24/13 0023 08/24/13 0302 08/25/13 0401  Weight: 98.4 kg (216 lb 14.9 oz) 98.4 kg (216 lb 14.9 oz) 99.2 kg (218 lb 11.1 oz)    Lab Results: Basic Metabolic Panel:  Recent Labs  08/24/13 1315 08/25/13 0325  NA 137 135*  K 5.2 4.9  CL 98 97  CO2 25 24  GLUCOSE 92 117*  BUN 75* 71*  CREATININE 1.34 1.25    CBC:  Recent Labs  08/23/13 1758  08/24/13 1130 08/25/13 0325  WBC 18.1*  < > 17.0* 16.9*  NEUTROABS 15.7*  --   --   --   HGB 14.2  < > 14.2 13.2  HCT 44.6  < > 44.7 42.4  MCV 85.9  < > 87.0 87.2  PLT 759*  < > 784* 749*  < > = values in this interval not displayed.  BNP    Component Value Date/Time   PROBNP 9383.0* 08/23/2013 1759    PROTIME: Lab Results  Component Value Date   INR 1.24 08/23/2013   INR 1.55* 08/08/2013    Telemetry: Sinus rhythm  Assessment/Plan:  1. Coronary artery disease with previous recent inferior and right ventricular infarction with occluded mid LAD and right coronary artery collaterals 2. Acute systolic congestive heart failure with ischemic cardiomyopathy and reduced RV function 3. Sleep apnea 4. Pericardial effusion 5. Peripheral vascular disease  Recommendations:  His pressure appears to be coming up some now with treatment of his  urosepsis. Continue to watch his blood pressure and holding the other medications. May slowly add them back once his urosepsis is treated.   Kerry Hough  MD [facility] Cardiology  08/25/2013, 10:04 AM

## 2013-08-26 LAB — RENAL FUNCTION PANEL
Albumin: 2.9 g/dL — ABNORMAL LOW (ref 3.5–5.2)
BUN: 60 mg/dL — AB (ref 6–23)
CHLORIDE: 98 meq/L (ref 96–112)
CO2: 28 meq/L (ref 19–32)
CREATININE: 1.1 mg/dL (ref 0.50–1.35)
Calcium: 9.4 mg/dL (ref 8.4–10.5)
GFR calc Af Amer: 81 mL/min — ABNORMAL LOW (ref >90)
GFR calc non Af Amer: 70 mL/min — ABNORMAL LOW (ref >90)
Glucose, Bld: 94 mg/dL (ref 70–99)
POTASSIUM: 5.3 meq/L (ref 3.7–5.3)
Phosphorus: 3.6 mg/dL (ref 2.3–4.6)
Sodium: 138 mEq/L (ref 137–147)

## 2013-08-26 LAB — CBC
HCT: 44.4 % (ref 39.0–52.0)
HEMOGLOBIN: 14 g/dL (ref 13.0–17.0)
MCH: 27.9 pg (ref 26.0–34.0)
MCHC: 31.5 g/dL (ref 30.0–36.0)
MCV: 88.4 fL (ref 78.0–100.0)
Platelets: 857 10*3/uL — ABNORMAL HIGH (ref 150–400)
RBC: 5.02 MIL/uL (ref 4.22–5.81)
RDW: 20 % — ABNORMAL HIGH (ref 11.5–15.5)
WBC: 14.6 10*3/uL — ABNORMAL HIGH (ref 4.0–10.5)

## 2013-08-26 MED ORDER — COLCHICINE 0.6 MG PO TABS
0.6000 mg | ORAL_TABLET | Freq: Every day | ORAL | Status: DC
  Administered 2013-08-26 – 2013-08-30 (×5): 0.6 mg via ORAL
  Filled 2013-08-26 (×5): qty 1

## 2013-08-26 MED ORDER — MORPHINE SULFATE 2 MG/ML IJ SOLN
2.0000 mg | Freq: Once | INTRAMUSCULAR | Status: AC
  Administered 2013-08-26: 2 mg via INTRAVENOUS
  Filled 2013-08-26: qty 1

## 2013-08-26 MED ORDER — MAGNESIUM CITRATE PO SOLN
0.5000 | Freq: Once | ORAL | Status: AC | PRN
  Filled 2013-08-26: qty 296

## 2013-08-26 MED ORDER — LISINOPRIL 2.5 MG PO TABS
2.5000 mg | ORAL_TABLET | Freq: Every day | ORAL | Status: DC
Start: 2013-08-26 — End: 2013-08-30
  Administered 2013-08-26 – 2013-08-30 (×5): 2.5 mg via ORAL
  Filled 2013-08-26 (×5): qty 1

## 2013-08-26 MED ORDER — BENZONATATE 100 MG PO CAPS
100.0000 mg | ORAL_CAPSULE | Freq: Three times a day (TID) | ORAL | Status: DC | PRN
Start: 2013-08-26 — End: 2013-08-30
  Filled 2013-08-26: qty 1

## 2013-08-26 MED ORDER — CARVEDILOL 3.125 MG PO TABS
3.1250 mg | ORAL_TABLET | Freq: Two times a day (BID) | ORAL | Status: DC
Start: 2013-08-26 — End: 2013-08-29
  Administered 2013-08-26 – 2013-08-29 (×7): 3.125 mg via ORAL
  Filled 2013-08-26 (×9): qty 1

## 2013-08-26 MED ORDER — POLYETHYLENE GLYCOL 3350 17 G PO PACK
17.0000 g | PACK | Freq: Two times a day (BID) | ORAL | Status: DC
  Administered 2013-08-26 – 2013-08-29 (×3): 17 g via ORAL
  Filled 2013-08-26 (×10): qty 1

## 2013-08-26 MED ORDER — ADULT MULTIVITAMIN W/MINERALS CH
1.0000 | ORAL_TABLET | Freq: Every day | ORAL | Status: DC
  Administered 2013-08-26 – 2013-08-30 (×5): 1 via ORAL
  Filled 2013-08-26 (×5): qty 1

## 2013-08-26 MED ORDER — SENNOSIDES-DOCUSATE SODIUM 8.6-50 MG PO TABS
1.0000 | ORAL_TABLET | Freq: Two times a day (BID) | ORAL | Status: DC
  Administered 2013-08-26 – 2013-08-30 (×7): 1 via ORAL
  Filled 2013-08-26 (×9): qty 1

## 2013-08-26 NOTE — Progress Notes (Signed)
Subjective:  Sitting up in chair at the side of bed today. No complaints of chest pain or shortness of breath at rest. Complained of pain when he stands on his foot as well as mild dyspnea with exertion.  Objective:  Vital Signs in the last 24 hours: BP 97/66  Pulse 84  Temp(Src) 97.5 F (36.4 C) (Oral)  Resp 25  Ht 5\' 5"  (1.651 m)  Wt 97.1 kg (214 lb 1.1 oz)  BMI 35.62 kg/m2  SpO2 95%  Physical Exam: Pleasant obese male in no acute distress. Lungs:  Clear  Cardiac:  Regular rhythm, normal S1 and S2, no S3  jugular venous distention noted Abdomen:  No tenderness in epigastric area  Extremities:  1+ edema noted, dusky toes with left foot cool  Intake/Output from previous day: 06/13 0701 - 06/14 0700 In: 720 [P.O.:720] Out: 2000 [Urine:2000] Weight Filed Weights   08/24/13 0302 08/25/13 0401 08/26/13 0500  Weight: 98.4 kg (216 lb 14.9 oz) 99.2 kg (218 lb 11.1 oz) 97.1 kg (214 lb 1.1 oz)    Lab Results: Basic Metabolic Panel:  Recent Labs  08/25/13 0325 08/26/13 0322  NA 135* 138  K 4.9 5.3  CL 97 98  CO2 24 28  GLUCOSE 117* 94  BUN 71* 60*  CREATININE 1.25 1.10    CBC:  Recent Labs  08/23/13 1758  08/25/13 0325 08/26/13 0322  WBC 18.1*  < > 16.9* 14.6*  NEUTROABS 15.7*  --   --   --   HGB 14.2  < > 13.2 14.0  HCT 44.6  < > 42.4 44.4  MCV 85.9  < > 87.2 88.4  PLT 759*  < > 749* 857*  < > = values in this interval not displayed.  BNP    Component Value Date/Time   PROBNP 9383.0* 08/23/2013 1759    PROTIME: Lab Results  Component Value Date   INR 1.24 08/23/2013   INR 1.55* 08/08/2013    Telemetry: Sinus rhythm  Assessment/Plan:  1. Coronary artery disease with previous recent inferior and right ventricular infarction with occluded mid LAD and right coronary artery collaterals 2. Acute systolic congestive heart failure with ischemic cardiomyopathy and reduced RV function 3. Sleep apnea 4. Pericardial effusion 5. Peripheral vascular  disease  Recommendations:  His blood pressure has been stable overnight. I will restart lisinopril and carvedilol at lower doses. Kerry Hough  MD [facility] Cardiology  08/26/2013, 9:57 AM

## 2013-08-26 NOTE — Progress Notes (Signed)
[address] - Stepdown / ICU Progress Note  Fernando Lane:[id number] DOB: 01/21/1951 DOA: 08/23/2013 PCP: Fernando Grant, MD  Brief narrative: 63 y.o. male with history of polycythemia vera, recent MI post cardiac cath (found to have two-vessel disease with EF of 20% which is managed medically), recent pericardiocentesis for effusion due to pericarditis, and chronic right leg stasis ulcers managed conservatively who went for routine followup with his Cardiologist when he was found to have systolic BPs in the 50Y.  The patient was transferred to [facility].   After arrival to the ER the patient reported feeling weak and dizzy. He did have a fall at his nursing home prior to arrival during transfer to his wheelchair. Patient did not lose consciousness or hit his head after his fall but had some bruising on his abdomen. CT abdomen and pelvis showed contusions of his spleen and a rib fracture. Patient otherwise denied any chest pain or shortness of breath, nausea, vomiting or diarrhea. Patient's blood pressure did improve with IV boluses. UA showed features concerning for UTI so he was placed on antibiotics. Blood cultures were obtained.  HPI/Subjective: Pt remains alert.  States he has not had a bowel movement in "days."  RLE pain persists.  No sob or cp.    Assessment/Plan:  Sepsis due to pyelonephritis (HR 104, WBC 16.2, confirmed UTI) Sepsis physiology is improving   Metabolic encephalopathy due to hypotension, infection, ARF(azotemia), and probable accumulated Neurontin in setting of ARF - much improved today/appears to have resolved   Pan sensitive E coli pyelonephritis  cont empiric anbx's for planned 10 days of tx   Acute renal failure multifactorial: dehydration, ATN due to ACE I and diuretics, hypotension, and IV contrast for CT scan - steadily improving  Abdominal wall contusion / Contusion of spleen appreciate Trauma service assistance - stable on exam  and no evidence of bleeding on CT - Hgb stable    POLYCYTHEMIA VERA w/ thrombocytosis  Hgb and platelets at baseline  Hx of HYPERTENSION BP is recovering - resume essential cardiac meds in a stepwise fashion   CORONARY ARTERY DISEASE / recent STEMI / recent pericardial effusion post pericardiocentesis per Cards - cont Plavix, ASA, statin  Chronic systolic heart failure / ischemic CM per Cards - currently well compensated  OBSTRUCTIVE SLEEP APNEA  PERIPHERAL VASCULAR DISEASE/Leg ulcer ulcer on RLE - ABIs/arterial duplex 08/08/13 without evidence of stenosis, occlusion, or aneurysm bilaterally - VVS eval last admit and if pain uncontrollable only option would be amputation - due to pain unable to tolerate SCD to RLE - WOC RN has seen   DVT prophylaxis: SCDs Code Status: Full Family Communication: spoke w/ pt and son at bedside   Disposition Plan/Expected LOS: Step down - possible transfer to med be in AM - ultimate goal is to return to rehab facility in [place]  Consultants: Cardiology Trauma service  Procedures: None  Antibiotics: Rocephin 6/11 >>  Objective: Blood pressure 106/68, pulse 84, temperature 97.3 F (36.3 C), temperature source Oral, resp. rate 20, height 5\' 5"  (1.651 m), weight 97.1 kg (214 lb 1.1 oz), SpO2 99.00%.  Intake/Output Summary (Last 24 hours) at 08/26/13 1326 Last data filed at 08/26/13 1200  Gross per 24 hour  Intake   1010 ml  Output   1800 ml  Net   -790 ml   Exam: General: No acute respiratory distress - alert and oriented  Lungs: Clear to auscultation bilaterally without wheezes or crackles Cardiovascular: Regular  rate and rhythm without murmur gallop or rub, trace bilateral lower extremity peripheral edema  Abdomen: Nontender, soft, bowel sounds positive, no rebound, no ascites, no appreciable mass - ecchymosis B lower abdom quad/flanks w/o change  Musculoskeletal: No significant cyanosis, clubbing of bilateral lower extremities - R LE  wound dressed  Scheduled Meds:  Scheduled Meds: . antiseptic oral rinse  15 mL Mouth Rinse BID  . aspirin  81 mg Oral Daily  . atorvastatin  80 mg Oral q1800  . carvedilol  3.125 mg Oral BID WC  . cefTRIAXone (ROCEPHIN)  IV  1 g Intravenous QHS  . clopidogrel  75 mg Oral Q breakfast  . feeding supplement (ENSURE)  1 Container Oral BID BM  . folic acid  1 mg Oral Daily  . lisinopril  2.5 mg Oral Daily  . sodium chloride  3 mL Intravenous Q12H  . thiamine  100 mg Oral Daily   Data Reviewed: Basic Metabolic Panel:  Recent Labs Lab 08/23/13 1758 08/24/13 1315 08/25/13 0325 08/26/13 0322  NA 131* 137 135* 138  K 5.4* 5.2 4.9 5.3  CL 93* 98 97 98  CO2 24 25 24 28   GLUCOSE 104* 92 117* 94  BUN 79* 75* 71* 60*  CREATININE 1.58* 1.34 1.25 1.10  CALCIUM 9.1 9.3 9.2 9.4  PHOS  --   --   --  3.6   Liver Function Tests:  Recent Labs Lab 08/23/13 1758 08/26/13 0322  AST 34  --   ALT 46  --   ALKPHOS 204*  --   BILITOT 0.9  --   PROT 6.7  --   ALBUMIN 2.8* 2.9*   CBC:  Recent Labs Lab 08/23/13 1758  08/24/13 0354 08/24/13 0827 08/24/13 1130 08/25/13 0325 08/26/13 0322  WBC 18.1*  < > 16.1* 17.0* 17.0* 16.9* 14.6*  NEUTROABS 15.7*  --   --   --   --   --   --   HGB 14.2  < > 13.6 13.8 14.2 13.2 14.0  HCT 44.6  < > 42.8 44.1 44.7 42.4 44.4  MCV 85.9  < > 85.8 87.0 87.0 87.2 88.4  PLT 759*  < > 718* 723* 784* 749* 857*  < > = values in this interval not displayed.  Cardiac Enzymes:  Recent Labs Lab 08/23/13 1819 08/24/13 0037  TROPONINI <0.30 <0.30   BNP (last 3 results)  Recent Labs  08/07/13 1600 08/23/13 1759  PROBNP 13375.0* 9383.0*   CBG:  Recent Labs Lab 08/23/13 1748 08/23/13 2225  GLUCAP 94 108*    Recent Results (from the past 240 hour(s))  URINE CULTURE     Status: None   Collection Time    08/23/13  6:35 PM      Result Value Ref Range Status   Specimen Description URINE, CLEAN CATCH   Final   Special Requests NONE   Final    Culture  Setup Time     Final   Value: 08/24/2013 00:35     Performed at [facility]     Final   Value: >=100,000 COLONIES/ML     Performed at [facility]   Culture     Final   Value: ESCHERICHIA COLI     Performed at [facility]   Report Status 08/25/2013 FINAL   Final   Organism ID, Bacteria ESCHERICHIA COLI   Final  CULTURE, BLOOD (ROUTINE X 2)     Status: None  Collection Time    08/23/13  7:37 PM      Result Value Ref Range Status   Specimen Description BLOOD RIGHT FOREARM   Final   Special Requests BOTTLES DRAWN AEROBIC AND ANAEROBIC 5CC   Final   Culture  Setup Time     Final   Value: 08/24/2013 04:36     Performed at [facility]   Culture     Final   Value:        BLOOD CULTURE RECEIVED NO GROWTH TO DATE CULTURE WILL BE HELD FOR 5 DAYS BEFORE ISSUING A FINAL NEGATIVE REPORT     Performed at [facility]   Report Status PENDING   Incomplete  CULTURE, BLOOD (ROUTINE X 2)     Status: None   Collection Time    08/23/13  7:45 PM      Result Value Ref Range Status   Specimen Description BLOOD LEFT HAND   Final   Special Requests BOTTLES DRAWN AEROBIC ONLY 5CC   Final   Culture  Setup Time     Final   Value: 08/24/2013 04:36     Performed at [facility]   Culture     Final   Value:        BLOOD CULTURE RECEIVED NO GROWTH TO DATE CULTURE WILL BE HELD FOR 5 DAYS BEFORE ISSUING A FINAL NEGATIVE REPORT     Performed at [facility]   Report Status PENDING   Incomplete     Studies:  Recent x-ray studies have been reviewed in detail by the Attending Physician  Time spent :  9 mins  Cherene Altes, MD [facility] For Consults/Admissions - Flow Manager - [phone] Office  [phone] Pager [phone]  On-Call/Text Page:      [email or web address]      password [facility]  08/26/2013, 1:26 PM   LOS: 3 days

## 2013-08-26 NOTE — Consult Note (Signed)
WOC wound consult note Reason for Consult:Asked to see to reevaluate wound on RLE with increased exudate.Patient known to my partners who last saw on 5/28 and 08/24/13. Has also seen outpatient wound care center staff and "just stopped going".  Followed by VVS. Patient states duration is approximately 1 month. Right foot, 4th digit is discolored with plantar discoloration extending to area beneath 5th digit.  Patient states he has neuropathic pain in this area. Wound type:Venous insufficiency with ulceration.   Pressure Ulcer POA:No Measurement:Right lateral LE with two ulcerations, Proximal measures 4.5cm x 5.5cm x 0.2cm and distal measures 1.5cm x 1cm x 0.2cm. Skin is intact surrounding both wounds.  Wound XEN:MMHWK red with thin yellow exudate that soils silicone foam dressing in 1.5-2 days.   Drainage (amount, consistency, odor) thin, moderate amount of yellow exudate.  No odor Periwound:intact. Dressing procedure/placement/frequency: I will add a calcium alginate as a wound contact layer to the dressing regimen for absorption of excess exudate and continue the placement of a silicone foam dressing with twice weekly changes (versus the 5 days previously implemented).  Patient and son are in agreement with POC.  Additionally, I will add a pressure redistribution boot (Prevalon) to protect the toes from traumatic injury and to correct alignment of right foot to a more neutral position (he rotates laterally and puts pressure on teh lateral LE wounds). [place] nursing team will not follow routionely, but will remain available to this patient, the nursing and medical teams.  Please re-consult if needed or if wound progression is inconsistent with patient's overall status. Thanks, Maudie Flakes, MSN, RN, [place], [place], [place] [phone])

## 2013-08-26 NOTE — Progress Notes (Signed)
Placed pt. On cpap. Pt. Is tolerating well at this time.  

## 2013-08-27 LAB — BASIC METABOLIC PANEL
BUN: 43 mg/dL — AB (ref 6–23)
CHLORIDE: 98 meq/L (ref 96–112)
CO2: 27 mEq/L (ref 19–32)
Calcium: 9.7 mg/dL (ref 8.4–10.5)
Creatinine, Ser: 0.95 mg/dL (ref 0.50–1.35)
GFR calc Af Amer: >90 mL/min (ref >90)
GFR calc non Af Amer: 87 mL/min — ABNORMAL LOW (ref >90)
GLUCOSE: 108 mg/dL — AB (ref 70–99)
Potassium: 5.3 mEq/L (ref 3.7–5.3)
Sodium: 136 mEq/L — ABNORMAL LOW (ref 137–147)

## 2013-08-27 LAB — CBC
HEMATOCRIT: 45.1 % (ref 39.0–52.0)
HEMOGLOBIN: 14.1 g/dL (ref 13.0–17.0)
MCH: 27.5 pg (ref 26.0–34.0)
MCHC: 31.3 g/dL (ref 30.0–36.0)
MCV: 87.9 fL (ref 78.0–100.0)
Platelets: 829 10*3/uL — ABNORMAL HIGH (ref 150–400)
RBC: 5.13 MIL/uL (ref 4.22–5.81)
RDW: 20.1 % — ABNORMAL HIGH (ref 11.5–15.5)
WBC: 17.4 10*3/uL — ABNORMAL HIGH (ref 4.0–10.5)

## 2013-08-27 MED ORDER — MUPIROCIN 2 % EX OINT
1.0000 "application " | TOPICAL_OINTMENT | Freq: Two times a day (BID) | CUTANEOUS | Status: DC
  Administered 2013-08-27 – 2013-08-30 (×6): 1 via NASAL
  Filled 2013-08-27: qty 22

## 2013-08-27 MED ORDER — CHLORHEXIDINE GLUCONATE CLOTH 2 % EX PADS
6.0000 | MEDICATED_PAD | Freq: Every day | CUTANEOUS | Status: DC
  Administered 2013-08-28: 6 via TOPICAL

## 2013-08-27 NOTE — Progress Notes (Signed)
[address] - Stepdown / ICU Progress Note  Fernando Lane DJM:[id number] DOB: 1950/07/11 DOA: 08/23/2013 PCP: Gwendolyn Grant, MD  Brief narrative: 63 y.o. male with history of polycythemia vera, recent MI post cardiac cath (found to have two-vessel disease with EF of 20% which is managed medically), recent pericardiocentesis for effusion due to pericarditis, and chronic right leg stasis ulcers managed conservatively who went for routine followup with his Cardiologist when he was found to have systolic BPs in the 22W.  The patient was transferred to [facility].   After arrival to the ER the patient reported feeling weak and dizzy. He did have a fall at his nursing home prior to arrival during transfer to his wheelchair. Patient did not lose consciousness or hit his head after his fall but had some bruising on his abdomen. CT abdomen and pelvis showed contusions of his spleen and a rib fracture. Patient otherwise denied any chest pain or shortness of breath, nausea, vomiting or diarrhea. Patient's blood pressure did improve with IV boluses. UA showed features concerning for UTI so he was placed on antibiotics. Blood cultures were obtained.  HPI/Subjective: Pt had a large BM today.  He states that he is feeling better overall.  No new complaints.   Assessment/Plan:  Sepsis due to pyelonephritis (HR 104, WBC 16.2, confirmed UTI) Sepsis physiology has resolved   Metabolic encephalopathy due to hypotension, infection, ARF(azotemia), and probable accumulated Neurontin in setting of ARF - resolved   Pan sensitive E coli pyelonephritis  cont empiric anbx's for planned 10 days of tx   Acute renal failure multifactorial: dehydration, ATN due to ACE I and diuretics, hypotension, and IV contrast for CT scan - crt has now normalized w/ GFR > 90  Abdominal wall contusion / Contusion of spleen appreciate Trauma service assistance - stable on exam and no evidence of bleeding on CT -  Hgb stable    POLYCYTHEMIA VERA w/ thrombocytosis  Hgb and platelets at baseline  Hx of HYPERTENSION BP is recovering - resume essential cardiac meds in a stepwise fashion   CORONARY ARTERY DISEASE / recent STEMI / recent pericardial effusion post pericardiocentesis per Cards - cont Plavix, ASA, statin  Chronic systolic heart failure / ischemic CM per Cards - currently well compensated  OBSTRUCTIVE SLEEP APNEA Nightly CPAP   PERIPHERAL VASCULAR DISEASE/Leg ulcer ulcer on RLE - ABIs/arterial duplex 08/08/13 without evidence of stenosis, occlusion, or aneurysm bilaterally - VVS eval last admit and if pain uncontrollable only option would be amputation - due to pain unable to tolerate SCD to RLE - WOC RN has seen   DVT prophylaxis: SCDs Code Status: Full Family Communication: no family present at time of exam today    Disposition Plan/Expected LOS: SDU - plan transfer to [place] in AM   Consultants: Cardiology Trauma service  Procedures: None  Antibiotics: Rocephin 6/11 >>  Objective: Blood pressure 108/58, pulse 85, temperature 97.8 F (36.6 C), temperature source Oral, resp. rate 20, height 5\' 5"  (1.651 m), weight 97.1 kg (214 lb 1.1 oz), SpO2 98.00%.  Intake/Output Summary (Last 24 hours) at 08/27/13 1650 Last data filed at 08/27/13 1450  Gross per 24 hour  Intake    600 ml  Output   1400 ml  Net   -800 ml   Exam: General: No acute respiratory distress - alert and oriented  Lungs: Clear to auscultation bilaterally without wheezes or crackles Cardiovascular: Regular rate and rhythm without murmur gallop or rub, trace bilateral  lower extremity edema  Abdomen: Nontender, soft, bowel sounds positive, no rebound, no ascites, no appreciable mass - ecchymosis B lower abdom quad/flanks w/o change  Musculoskeletal: No significant cyanosis, clubbing of bilateral lower extremities - R LE wound dressed  Scheduled Meds:  Scheduled Meds: . antiseptic oral rinse  15 mL Mouth Rinse  BID  . aspirin  81 mg Oral Daily  . atorvastatin  80 mg Oral q1800  . carvedilol  3.125 mg Oral BID WC  . cefTRIAXone (ROCEPHIN)  IV  1 g Intravenous QHS  . clopidogrel  75 mg Oral Q breakfast  . colchicine  0.6 mg Oral Daily  . feeding supplement (ENSURE)  1 Container Oral BID BM  . folic acid  1 mg Oral Daily  . lisinopril  2.5 mg Oral Daily  . multivitamin with minerals  1 tablet Oral Daily  . polyethylene glycol  17 g Oral BID  . senna-docusate  1 tablet Oral BID  . sodium chloride  3 mL Intravenous Q12H  . thiamine  100 mg Oral Daily   Data Reviewed: Basic Metabolic Panel:  Recent Labs Lab 08/23/13 1758 08/24/13 1315 08/25/13 0325 08/26/13 0322 08/27/13 0405  NA 131* 137 135* 138 136*  K 5.4* 5.2 4.9 5.3 5.3  CL 93* 98 97 98 98  CO2 24 25 24 28 27   GLUCOSE 104* 92 117* 94 108*  BUN 79* 75* 71* 60* 43*  CREATININE 1.58* 1.34 1.25 1.10 0.95  CALCIUM 9.1 9.3 9.2 9.4 9.7  PHOS  --   --   --  3.6  --    Liver Function Tests:  Recent Labs Lab 08/23/13 1758 08/26/13 0322  AST 34  --   ALT 46  --   ALKPHOS 204*  --   BILITOT 0.9  --   PROT 6.7  --   ALBUMIN 2.8* 2.9*   CBC:  Recent Labs Lab 08/23/13 1758  08/24/13 0827 08/24/13 1130 08/25/13 0325 08/26/13 0322 08/27/13 0405  WBC 18.1*  < > 17.0* 17.0* 16.9* 14.6* 17.4*  NEUTROABS 15.7*  --   --   --   --   --   --   HGB 14.2  < > 13.8 14.2 13.2 14.0 14.1  HCT 44.6  < > 44.1 44.7 42.4 44.4 45.1  MCV 85.9  < > 87.0 87.0 87.2 88.4 87.9  PLT 759*  < > 723* 784* 749* 857* 829*  < > = values in this interval not displayed.  Cardiac Enzymes:  Recent Labs Lab 08/23/13 1819 08/24/13 0037  TROPONINI <0.30 <0.30   BNP (last 3 results)  Recent Labs  08/07/13 1600 08/23/13 1759  PROBNP 13375.0* 9383.0*   CBG:  Recent Labs Lab 08/23/13 1748 08/23/13 2225  GLUCAP 94 108*    Recent Results (from the past 240 hour(s))  URINE CULTURE     Status: None   Collection Time    08/23/13  6:35 PM       Result Value Ref Range Status   Specimen Description URINE, CLEAN CATCH   Final   Special Requests NONE   Final   Culture  Setup Time     Final   Value: 08/24/2013 00:35     Performed at [facility]     Final   Value: >=100,000 COLONIES/ML     Performed at [facility]   Culture     Final   Value: ESCHERICHIA COLI     Performed  at [facility]   Report Status 08/25/2013 FINAL   Final   Organism ID, Bacteria ESCHERICHIA COLI   Final  CULTURE, BLOOD (ROUTINE X 2)     Status: None   Collection Time    08/23/13  7:37 PM      Result Value Ref Range Status   Specimen Description BLOOD RIGHT FOREARM   Final   Special Requests BOTTLES DRAWN AEROBIC AND ANAEROBIC 5CC   Final   Culture  Setup Time     Final   Value: 08/24/2013 04:36     Performed at [facility]   Culture     Final   Value:        BLOOD CULTURE RECEIVED NO GROWTH TO DATE CULTURE WILL BE HELD FOR 5 DAYS BEFORE ISSUING A FINAL NEGATIVE REPORT     Performed at [facility]   Report Status PENDING   Incomplete  CULTURE, BLOOD (ROUTINE X 2)     Status: None   Collection Time    08/23/13  7:45 PM      Result Value Ref Range Status   Specimen Description BLOOD LEFT HAND   Final   Special Requests BOTTLES DRAWN AEROBIC ONLY 5CC   Final   Culture  Setup Time     Final   Value: 08/24/2013 04:36     Performed at [facility]   Culture     Final   Value:        BLOOD CULTURE RECEIVED NO GROWTH TO DATE CULTURE WILL BE HELD FOR 5 DAYS BEFORE ISSUING A FINAL NEGATIVE REPORT     Performed at [facility]   Report Status PENDING   Incomplete     Studies:  Recent x-ray studies have been reviewed in detail by the Attending Physician  Time spent :  3 mins  Cherene Altes, MD [facility] For Consults/Admissions - Flow Manager - [phone] Office  [phone] Pager [phone]  On-Call/Text Page:      [email or web address]      password  [facility]  08/27/2013, 4:50 PM   LOS: 4 days

## 2013-08-27 NOTE — Evaluation (Signed)
Occupational Therapy Evaluation Patient Details Name: Fernando Lane MRN: [id number] DOB: May 28, 1950 Today's Date: 08/27/2013    History of Present Illness 62 yo male admitted from SNF in [place] after recent hospital admission for ischemic Rt LE and Nstemi. pt s/p fall at snf with new spleen contusion and Rib fx. Pt now readmitted with HTN, sepsis due to pyelonephritis, and metabolic encephalopathy.   Clinical Impression   PT admitted with UTI, encephalopathy, rib fx and spleen contusion. Pt currently with functional limitiations due to the deficits listed below (see OT problem list). Recent d/c from [facility] to SNF. Pt will benefit from skilled OT to increase their independence and safety with adls and balance to allow discharge SNF. OT to follow acutely to assist with basic transfer and adl retraining.     Follow Up Recommendations  SNF    Equipment Recommendations  Other (comment) (defer SNF)    Recommendations for Other Services       Precautions / Restrictions Precautions Precautions: Fall Precaution Comments: rt foot x2 ulcers      Mobility Bed Mobility Overal bed mobility: Needs Assistance Bed Mobility: Supine to Sit     Supine to sit: Max assist;+2 for physical assistance;HOB elevated     General bed mobility comments: pt initiated with Lt UE and needed facilitation for full upirght posture  Transfers Overall transfer level: Needs assistance Equipment used:  (stedy) Transfers: Sit to/from [employer] Sit to Stand: Max assist;+2 physical assistance   Squat pivot transfers: Max assist;+2 physical assistance     General transfer comment: pt needed (A) to progress to chair from elevated surface    Balance Overall balance assessment: Needs assistance Sitting-balance support: Bilateral upper extremity supported;Feet supported Sitting balance-Leahy Scale: Fair       Standing balance-Leahy Scale: Poor                               ADL Overall ADL's : Needs assistance/impaired                         Toilet Transfer: +2 for physical assistance;Maximal assistance (stedy used) Armed forces technical officer Details (indicate cue type and reason): pt needed cues for hand placement but excellent use of stedy. Pt using DME previous admission and likes using Toileting- Clothing Manipulation and Hygiene: Total assistance;+2 for physical assistance         General ADL Comments: Pt supine <>Sit with total +2 (A). pt requires use of stedy for transfer to 3n1. pt attempting to use stedy to transfer to recliner but stedy unable to fix under front of chair. pt required trasnfer back to EOB. recliner arm dropped and pt squat pivot to chair total +2 with pad. Pt positioned in chair .     Vision                     Perception     Praxis      Pertinent Vitals/Pain VSS      Hand Dominance Right   Extremity/Trunk Assessment Upper Extremity Assessment Upper Extremity Assessment: Generalized weakness   Lower Extremity Assessment Lower Extremity Assessment: Defer to PT evaluation   Cervical / Trunk Assessment Cervical / Trunk Assessment: Normal   Communication Communication Communication: No difficulties   Cognition Arousal/Alertness: Awake/alert Behavior During Therapy: WFL for tasks assessed/performed Overall Cognitive Status: Impaired/Different from baseline Area of Impairment: Orientation;Attention;Memory;Following commands;Safety/judgement;Problem solving;Awareness Orientation Level: Disoriented  to;Situation;Time Current Attention Level: Sustained Memory: Decreased recall of precautions;Decreased short-term memory Following Commands: Follows one step commands with increased time Safety/Judgement: Decreased awareness of safety;Decreased awareness of deficits Awareness: Emergent Problem Solving: Slow processing;Decreased initiation;Difficulty sequencing General Comments: pt likes to joke alot and needs  redirection to task at times. pt pleasant and engages in task easily. pt unable to clearly state reason for admission but reports not liking conditions at SNF.    General Comments       Exercises       Shoulder Instructions      Home Living Family/patient expects to be discharged to:: Skilled nursing facility Living Arrangements: Alone Available Help at Discharge: [place]                                    Prior Functioning/Environment Level of Independence: Needs assistance    ADL's / Homemaking Assistance Needed: requires assistance with all ADLS since recent admission        OT Diagnosis: Generalized weakness;Cognitive deficits;Acute pain   OT Problem List: Decreased strength;Decreased range of motion;Decreased activity tolerance;Impaired balance (sitting and/or standing);Decreased cognition;Decreased safety awareness;Decreased knowledge of use of DME or AE;Decreased knowledge of precautions;Cardiopulmonary status limiting activity;Obesity;Pain   OT Treatment/Interventions: Self-care/ADL training;Therapeutic exercise;DME and/or AE instruction;Therapeutic activities;Cognitive remediation/compensation;Patient/family education;Balance training    OT Goals(Current goals can be found in the care plan section) Acute Rehab OT Goals Patient Stated Goal: pt wants to return home to cat  OT Goal Formulation: With patient Time For Goal Achievement: 09/10/13 Potential to Achieve Goals: Good  OT Frequency: Min 2X/week   Barriers to D/C: Decreased caregiver support          Co-evaluation              End of Session Equipment Utilized During Treatment: Gait belt (stedy) Nurse Communication: Mobility status;Precautions  Activity Tolerance: Patient tolerated treatment well Patient left: in chair;with call bell/phone within reach   Time: 6295-2841 OT Time Calculation (min): 31 min Charges:  OT General Charges $OT Visit: 1 Procedure OT  Evaluation $Initial OT Evaluation Tier I: 1 Procedure OT Treatments $Self Care/Home Management : 23-37 mins G-Codes:    Peri Maris Aug 30, 2013, 2:16 PM Pager: [phone]

## 2013-08-27 NOTE — Evaluation (Signed)
Physical Therapy Evaluation Patient Details Name: Fernando Lane MRN: [id number] DOB: [birth year] Today's Date: 08/27/2013   History of Present Illness  63 yo male admitted from SNF in [place] after recent hospital admission for ischemic Rt LE and Nstemi. pt s/p fall at snf with new spleen contusion and Rib fx. Pt now readmitted with HTN, sepsis due to pyelonephritis, and metabolic encephalopathy.  Clinical Impression  Pt admitted with the above. Pt currently with functional limitations due to the deficits listed below (see PT Problem List). At the time of PT eval pt was relying on therapist and tech for most of mobility. VC's for technique to increase independence, however pt unwilling to make corrective changes. Pt will benefit from skilled PT to increase their independence and safety with mobility to allow discharge to the venue listed below.     Follow Up Recommendations SNF;Supervision/Assistance - 24 hour    Equipment Recommendations  Rolling walker with 5" wheels;3in1 (PT)    Recommendations for Other Services       Precautions / Restrictions Precautions Precautions: Fall Precaution Comments: rt foot x2 ulcers Restrictions Weight Bearing Restrictions: No      Mobility  Bed Mobility Overal bed mobility: Needs Assistance Bed Mobility: Sit to Supine     Supine to sit: Max assist;+2 for physical assistance;HOB elevated Sit to supine: Mod assist   General bed mobility comments: Mod assist to elevate LE's onto bed. Assist to scoot HOB.  Transfers Overall transfer level: Needs assistance Equipment used: 2 person hand held assist Transfers: Sit to/from [facility] Sit to Stand: Max assist;+2 physical assistance Stand pivot transfers: Mod assist;+2 physical assistance Squat pivot transfers: Max assist;+2 physical assistance     General transfer comment: VC's for hand placement on seated surface for safety. Pt relying on therapist and tech to stand and  maintain balance during SPT to chair>bed.   Ambulation/Gait                Stairs            Wheelchair Mobility    Modified Rankin (Stroke Patients Only)       Balance Overall balance assessment: Needs assistance Sitting-balance support: Feet supported;Bilateral upper extremity supported Sitting balance-Leahy Scale: Fair       Standing balance-Leahy Scale: Zero                               Pertinent Vitals/Pain Vitals stable throughout session. At end of session when pt at rest in bed, HR increased, jumping from 130's to >200bpm. RN notified immediately.     Home Living Family/patient expects to be discharged to:: Skilled nursing facility Living Arrangements: Alone Available Help at Discharge: [place]                  Prior Function Level of Independence: Needs assistance   Gait / Transfers Assistance Needed: Pt states he was not ambulating at SNF, transfers only  ADL's / Homemaking Assistance Needed: requires assistance with all ADLS since recent admission        Hand Dominance   Dominant Hand: Right    Extremity/Trunk Assessment   Upper Extremity Assessment: Defer to OT evaluation           Lower Extremity Assessment: Generalized weakness      Cervical / Trunk Assessment: Normal  Communication   Communication: No difficulties  Cognition Arousal/Alertness: Awake/alert Behavior During Therapy: [facility] for tasks  assessed/performed Overall Cognitive Status: Impaired/Different from baseline Area of Impairment: Attention;Following commands;Safety/judgement;Problem solving Orientation Level: Disoriented to;Situation;Time Current Attention Level: Sustained Memory: Decreased recall of precautions;Decreased short-term memory Following Commands: Follows one step commands with increased time Safety/Judgement: Decreased awareness of safety;Decreased awareness of deficits Awareness: Emergent Problem Solving: Slow  processing;Decreased initiation;Difficulty sequencing;Requires verbal cues General Comments: pt likes to joke alot and needs redirection to task at times. pt pleasant and engages in task easily. pt unable to clearly state reason for admission but reports not liking conditions at SNF.     General Comments      Exercises        Assessment/Plan    PT Assessment Patient needs continued PT services  PT Diagnosis Difficulty walking;Acute pain   PT Problem List Decreased strength;Decreased activity tolerance;Decreased range of motion;Decreased balance;Decreased mobility;Decreased knowledge of use of DME;Decreased safety awareness;Decreased knowledge of precautions;Pain;Decreased skin integrity  PT Treatment Interventions DME instruction;Gait training;Stair training;Functional mobility training;Therapeutic activities;Therapeutic exercise;Neuromuscular re-education;Patient/family education   PT Goals (Current goals can be found in the Care Plan section) Acute Rehab PT Goals Patient Stated Goal: pt wants to return home to cat  PT Goal Formulation: With patient Time For Goal Achievement: 09/03/13 Potential to Achieve Goals: Good    Frequency Min 2X/week   Barriers to discharge Decreased caregiver support      Co-evaluation               End of Session Equipment Utilized During Treatment:  (Pt declined gait belt use) Activity Tolerance: Patient limited by fatigue;Patient limited by pain Patient left: in bed;with call bell/phone within reach Nurse Communication: Mobility status;Other (comment) (Tachycardia at rest at end of session)         Time: 1610-9604 PT Time Calculation (min): 22 min   Charges:   PT Evaluation $Initial PT Evaluation Tier I: 1 Procedure PT Treatments $Therapeutic Activity: 8-22 mins   PT G Codes:          Jolyn Lent 08/27/2013, 3:26 PM  Jolyn Lent, PT, DPT Acute Rehabilitation Services Pager: [phone]

## 2013-08-27 NOTE — Progress Notes (Signed)
Placed pt. On cpap. Pt. Is tolerating well at this time.  

## 2013-08-27 NOTE — Progress Notes (Signed)
Patient ID: Fernando Lane, male   DOB: Oct 14, 1950, 63 y.o.   MRN: [id number]    SUBJECTIVE:  Admitted with hypotension. Had spleenic contusion from fall. All HF meds held. General surgery singed off.   Yesterday he was started on low dose carvedilol and lisinopril. Complains of fatigue. Mild dyspnea with exertion. No current weight. LUQ pain improved.   Denies CP/orthopnea.     Scheduled Meds: . antiseptic oral rinse  15 mL Mouth Rinse BID  . aspirin  81 mg Oral Daily  . atorvastatin  80 mg Oral q1800  . carvedilol  3.125 mg Oral BID WC  . cefTRIAXone (ROCEPHIN)  IV  1 g Intravenous QHS  . clopidogrel  75 mg Oral Q breakfast  . colchicine  0.6 mg Oral Daily  . feeding supplement (ENSURE)  1 Container Oral BID BM  . folic acid  1 mg Oral Daily  . lisinopril  2.5 mg Oral Daily  . multivitamin with minerals  1 tablet Oral Daily  . polyethylene glycol  17 g Oral BID  . senna-docusate  1 tablet Oral BID  . sodium chloride  3 mL Intravenous Q12H  . thiamine  100 mg Oral Daily   Continuous Infusions:   PRN Meds:.acetaminophen, acetaminophen, benzonatate, ondansetron (ZOFRAN) IV, ondansetron, oxyCODONE-acetaminophen    Filed Vitals:   08/27/13 0200 08/27/13 0506 08/27/13 0600 08/27/13 0800  BP: 117/74  114/76 114/76  Pulse:    94  Temp:  96.5 F (35.8 C)  97.9 F (36.6 C)  TempSrc:  Axillary  Oral  Resp:    23  Height:      Weight:      SpO2:    96%    Intake/Output Summary (Last 24 hours) at 08/27/13 0937 Last data filed at 08/27/13 0041  Gross per 24 hour  Intake    480 ml  Output   1150 ml  Net   -670 ml    LABS: Basic Metabolic Panel:  Recent Labs  08/25/13 0325 08/26/13 0322 08/27/13 0405  NA 135* 138 136*  K 4.9 5.3 5.3  CL 97 98 98  CO2 24 28 27   GLUCOSE 117* 94 108*  BUN 71* 60* 43*  CREATININE 1.25 1.10 0.95  CALCIUM 9.2 9.4 9.7  PHOS  --  3.6  --    Liver Function Tests:  Recent Labs  08/26/13 0322  ALBUMIN 2.9*   No results found  for this basename: LIPASE, AMYLASE,  in the last 72 hours CBC:  Recent Labs  08/26/13 0322 08/27/13 0405  WBC 14.6* 17.4*  HGB 14.0 14.1  HCT 44.4 45.1  MCV 88.4 87.9  PLT 857* 829*   Cardiac Enzymes: No results found for this basename: CKTOTAL, CKMB, CKMBINDEX, TROPONINI,  in the last 72 hours BNP: No components found with this basename: POCBNP,  D-Dimer: No results found for this basename: DDIMER,  in the last 72 hours Hemoglobin A1C: No results found for this basename: HGBA1C,  in the last 72 hours Fasting Lipid Panel: No results found for this basename: CHOL, HDL, LDLCALC, TRIG, CHOLHDL, LDLDIRECT,  in the last 72 hours Thyroid Function Tests: No results found for this basename: TSH, T4TOTAL, FREET3, T3FREE, THYROIDAB,  in the last 72 hours Anemia Panel: No results found for this basename: VITAMINB12, FOLATE, FERRITIN, TIBC, IRON, RETICCTPCT,  in the last 72 hours   PHYSICAL EXAM General: NAD Neck: Thick, JVP does not appear elevated but difficult to see due to body habitus.  no  thyromegaly or thyroid nodule.  Lungs: Clear to auscultation bilaterally with normal respiratory effort. CV: Nondisplaced PMI.  Heart regular S1/S2, no S3/S4, no murmur. Trace lower extremity edema bilaterally.  Toes on L and right feet dusky Abdomen: Soft, mild LUQ tenderness, no hepatosplenomegaly, no distention.  Neurologic: Alert and oriented x 3.  Psych: Normal affect. Extremities: No clubbing or cyanosis.   ASSESSMENT AND PLAN: 63 yo with history of polycythemia vera, HTN, and OSA presented with probable inferior MI recently with possible earlier anterior MI as well as evidence for ischemic foot (dusky toes) and acute systolic CHF.  Echo showed evidence for RV infarction and pericardial effusion.  He was treated medically and discharged to a SNF.  He returned to hospital from [facility] Thursday with hypotension and probable urosepsis.  He also had a fall with  splenic contusion.   1. Hypotension-  improved  2. UTI - on day 4/10 rocephin 3. Fall --> Splenic Contusion 4. CAD 4. Chronic Systolic HF . ECHO EF 73-22% -akinetic apex, septal wall, and inferior wall.  RV moderately dilated and with severely reduced systolic function (consistent with RV infarction).  5. PAD 6. OSA 7. CKD.  8. Lower extremity wounds  Volume status stable off diuretics. Will need lasix in the next day or so. Continue low dose carvedilol and lisinopril 2.5 mg daily. Will not increase lisinopril due to elevated K. Watch potassium closely.   Consult PT. Need to mobilize.    CLEGG,AMY NP-C  08/27/2013 9:37 AM  Patient seen and examined with Darrick Grinder, NP. We discussed all aspects of the encounter. I agree with the assessment and plan as stated above.   HF currently stable. Agree that we will need to restart low-dose diuretics soon. Low threshold to stop ACE with hyperkalemia.   Ademide Schaberg,MD 8:01 PM

## 2013-08-27 NOTE — Progress Notes (Signed)
CSW provided emotional support to pt and spoke over the phone with pt's son. Pt explained he is not against returning to [facility], but the conditions of the facility were not ideal and he did not get the best rehab. CSW explained that once PT sees pt and if he would like to explore other placement options, CSW is happy to help with this. Pt also would like information about home health and wants to know all resources when discharge is imminent and PT has made their recommendation. CSW following case and will continue to provide support to pt and his son.   Ky Barban, MSW, [facility] Clinical Social Worker [phone]

## 2013-08-28 LAB — BASIC METABOLIC PANEL
BUN: 31 mg/dL — AB (ref 6–23)
CALCIUM: 9.5 mg/dL (ref 8.4–10.5)
CHLORIDE: 99 meq/L (ref 96–112)
CO2: 25 meq/L (ref 19–32)
CREATININE: 0.79 mg/dL (ref 0.50–1.35)
GFR calc Af Amer: >90 mL/min (ref >90)
GFR calc non Af Amer: >90 mL/min (ref >90)
Glucose, Bld: 101 mg/dL — ABNORMAL HIGH (ref 70–99)
Potassium: 5 mEq/L (ref 3.7–5.3)
Sodium: 137 mEq/L (ref 137–147)

## 2013-08-28 LAB — CBC
HCT: 43 % (ref 39.0–52.0)
Hemoglobin: 13.2 g/dL (ref 13.0–17.0)
MCH: 27 pg (ref 26.0–34.0)
MCHC: 30.7 g/dL (ref 30.0–36.0)
MCV: 87.9 fL (ref 78.0–100.0)
Platelets: 801 10*3/uL — ABNORMAL HIGH (ref 150–400)
RBC: 4.89 MIL/uL (ref 4.22–5.81)
RDW: 20.4 % — ABNORMAL HIGH (ref 11.5–15.5)
WBC: 15.4 10*3/uL — ABNORMAL HIGH (ref 4.0–10.5)

## 2013-08-28 MED ORDER — FUROSEMIDE 20 MG PO TABS
20.0000 mg | ORAL_TABLET | Freq: Two times a day (BID) | ORAL | Status: DC
  Administered 2013-08-29 – 2013-08-30 (×3): 20 mg via ORAL
  Filled 2013-08-28 (×6): qty 1

## 2013-08-28 NOTE — Progress Notes (Signed)
Patient ID: Fernando Lane, male   DOB: 02-09-1951, 63 y.o.   MRN: [id number]    SUBJECTIVE:  Admitted with hypotension. Had spleenic contusion from fall. All HF meds held. General surgery signed off.   Tolerating low dose carvedilol and lisinopril.  Weight down 1 pound off diuretics. No current weight. LUQ pain improving    Denies CP/orthopnea.  Remains SOB with exertion.   Creatinine 0.79  K 5.0    Scheduled Meds: . antiseptic oral rinse  15 mL Mouth Rinse BID  . aspirin  81 mg Oral Daily  . atorvastatin  80 mg Oral q1800  . carvedilol  3.125 mg Oral BID WC  . cefTRIAXone (ROCEPHIN)  IV  1 g Intravenous QHS  . Chlorhexidine Gluconate Cloth  6 each Topical Q0600  . clopidogrel  75 mg Oral Q breakfast  . colchicine  0.6 mg Oral Daily  . feeding supplement (ENSURE)  1 Container Oral BID BM  . folic acid  1 mg Oral Daily  . lisinopril  2.5 mg Oral Daily  . multivitamin with minerals  1 tablet Oral Daily  . mupirocin ointment  1 application Nasal BID  . polyethylene glycol  17 g Oral BID  . senna-docusate  1 tablet Oral BID  . sodium chloride  3 mL Intravenous Q12H  . thiamine  100 mg Oral Daily   Continuous Infusions:   PRN Meds:.acetaminophen, acetaminophen, benzonatate, ondansetron (ZOFRAN) IV, ondansetron, oxyCODONE-acetaminophen    Filed Vitals:   08/27/13 2337 08/28/13 0400 08/28/13 0835 08/28/13 1015  BP: 105/65 110/52 118/66 118/83  Pulse: 79 86 82 89  Temp: 97.9 F (36.6 C) 97.5 F (36.4 C) 98.5 F (36.9 C)   TempSrc: Oral Oral Oral   Resp: 19 19 12    Height:      Weight:  213 lb 13.5 oz (97 kg)    SpO2: 93% 96% 96%     Intake/Output Summary (Last 24 hours) at 08/28/13 1050 Last data filed at 08/28/13 0900  Gross per 24 hour  Intake    463 ml  Output   1050 ml  Net   -587 ml    LABS: Basic Metabolic Panel:  Recent Labs  08/26/13 0322 08/27/13 0405 08/28/13 0240  NA 138 136* 137  K 5.3 5.3 5.0  CL 98 98 99  CO2 28 27 25   GLUCOSE 94 108*  101*  BUN 60* 43* 31*  CREATININE 1.10 0.95 0.79  CALCIUM 9.4 9.7 9.5  PHOS 3.6  --   --    Liver Function Tests:  Recent Labs  08/26/13 0322  ALBUMIN 2.9*   No results found for this basename: LIPASE, AMYLASE,  in the last 72 hours CBC:  Recent Labs  08/27/13 0405 08/28/13 0240  WBC 17.4* 15.4*  HGB 14.1 13.2  HCT 45.1 43.0  MCV 87.9 87.9  PLT 829* 801*   Cardiac Enzymes: No results found for this basename: CKTOTAL, CKMB, CKMBINDEX, TROPONINI,  in the last 72 hours BNP: No components found with this basename: POCBNP,  D-Dimer: No results found for this basename: DDIMER,  in the last 72 hours Hemoglobin A1C: No results found for this basename: HGBA1C,  in the last 72 hours Fasting Lipid Panel: No results found for this basename: CHOL, HDL, LDLCALC, TRIG, CHOLHDL, LDLDIRECT,  in the last 72 hours Thyroid Function Tests: No results found for this basename: TSH, T4TOTAL, FREET3, T3FREE, THYROIDAB,  in the last 72 hours Anemia Panel: No results found for this basename:  VITAMINB12, FOLATE, FERRITIN, TIBC, IRON, RETICCTPCT,  in the last 72 hours   PHYSICAL EXAM General: NAD Neck: Thick, JVP does not appear elevated but difficult to see due to body habitus.  no thyromegaly or thyroid nodule.  Lungs: Clear to auscultation bilaterally with normal respiratory effort. CV: Nondisplaced PMI.  Heart regular S1/S2, no S3/S4, no murmur. Trace lower extremity edema bilaterally.  Toes on L and right feet dusky Abdomen: Soft, mild LUQ tenderness, no hepatosplenomegaly, no distention.  Neurologic: Alert and oriented x 3.  Psych: Normal affect. Extremities: No clubbing or cyanosis.   ASSESSMENT AND PLAN: 63 yo with history of polycythemia vera, HTN, and OSA presented with probable inferior MI recently with possible earlier anterior MI as well as evidence for ischemic foot (dusky toes) and acute systolic CHF.  Echo showed evidence for RV infarction and pericardial effusion.  He was  treated medically and discharged to a SNF.  He returned to hospital from [facility] Thursday with hypotension and probable urosepsis.  He also had a fall with  splenic contusion.   1. Hypotension- improved  2. UTI - on day 5/10 rocephin 3. Fall --> Splenic Contusion 4. CAD 4. Chronic Systolic HF . ECHO EF 11-21% -akinetic apex, septal wall, and inferior wall.  RV moderately dilated and with severely reduced systolic function (consistent with RV infarction).  5. PAD 6. OSA 7. CKD.  8. Lower extremity wounds  Remains SOB with exertion but does not appear to be related to HF. Volume status stable off diuretics. Weight down another pound. Will need lasix in the next day or so. Continue low dose carvedilol and lisinopril 2.5 mg daily. Will not increase lisinopril due to elevated K. Follow daily BMET    CLEGG,AMY NP-C  08/28/2013 10:50 AM  Patient seen and examined with Darrick Grinder, NP. We discussed all aspects of the encounter. I agree with the assessment and plan as stated above. Weight continues to fall but suspect we will need to restart lasix in am.   Benay Spice 5:40 PM

## 2013-08-28 NOTE — Progress Notes (Addendum)
Clinical Social Work Department CLINICAL SOCIAL WORK PLACEMENT NOTE 08/28/2013  Patient:  Fernando Lane, Fernando Lane  Account Number:  [id number] Admit date:  08/23/2013  Clinical Social Worker:  Ky Barban, Latanya Presser  Date/time:  08/28/2013 01:20 PM  Clinical Social Work is seeking post-discharge placement for this patient at the following level of care:   [place]   (*CSW will update this form in Epic as items are completed)   N/A-pt authorized CSW to make referrals to [facility] and [facility] SNFs  Patient/family provided with [place] Department of Clinical Social Work's list of facilities offering this level of care within the geographic area requested by the patient (or if unable, by the patient's family).  08/28/2013  Patient/family informed of their freedom to choose among providers that offer the needed level of care, that participate in Medicare, Medicaid or managed care program needed by the patient, have an available bed and are willing to accept the patient.  N/A-will inform if this facility offers a bed Patient/family informed of [facility]' ownership interest in [facility], as well as of the fact that they are under no obligation to receive care at this facility.  PASARR submitted to EDS on EXISTING PASARR number received on EXISTING  FL2 transmitted to all facilities in geographic area requested by pt/family on  08/28/2013 FL2 transmitted to all facilities within larger geographic area on 08/28/2013  Patient informed that his/her managed care company has contracts with or will negotiate with  certain facilities, including the following:     Patient/family informed of bed offers received:  08/28/2013 Patient chooses bed at [facility] and [place] recommends and patient chooses bed at    Patient to be transferred to  on  08/30/2013  Patient to be transferred to facility by family Patient and family notified of transfer on  Name of  family member notified:    The following physician request were entered in Epic:   Additional Comments:   Ky Barban, MSW, [place] Social Worker [phone]

## 2013-08-28 NOTE — Progress Notes (Signed)
Fernando Lane 1 - Stepdown / ICU Progress Note  Fernando Lane AJO:[id number] DOB: February 03, 1951 DOA: 08/23/2013 PCP: Fernando Grant, MD  Brief narrative: 63 y.o. male with history of polycythemia vera, recent MI post cardiac cath (found to have two-vessel disease with EF of 20% which is managed medically), recent pericardiocentesis for effusion due to pericarditis, and chronic right leg stasis ulcers managed conservatively who went for routine followup with his Cardiologist when he was found to have systolic BPs in the 94B.  The patient was transferred to [facility].   After arrival to the ER the patient reported feeling weak and dizzy. He did have a fall at his nursing home prior to arrival during transfer to his wheelchair. Patient did not lose consciousness or hit his head after his fall but had some bruising on his abdomen. CT abdomen and pelvis showed contusions of his spleen and a rib fracture. Patient otherwise denied any chest pain or shortness of breath, nausea, vomiting or diarrhea. Patient's blood pressure did improve with IV boluses. UA showed features concerning for UTI so he was placed on antibiotics. Blood cultures were obtained.  HPI/Subjective: Alert and endorsed generalized malaise- no SOB, CP or significant LE pain   Assessment/Plan:  Sepsis due to pyelonephritis (HR 104, WBC 16.2, confirmed UTI) Sepsis physiology now resolved   Metabolic encephalopathy due to hypotension, infection, ARF(azotemia), and probable accumulated Neurontin in setting of ARF now resolved   Pan sensitive E coli pyelonephritis  cont empiric anbx's for a total of 10 days tx   Acute renal failure multifactorial: dehydration, ATN due to ACE I and diuretics, hypotension, and IV contrast for CT scan - renal fnx has now normalized w/ GFR > 90  Abdominal wall contusion / Contusion of spleen appreciate Trauma service assistance - stable on exam and no evidence of bleeding on CT - Hgb  stable    POLYCYTHEMIA VERA w/ thrombocytosis  Hgb and platelets at baseline  Hx of HYPERTENSION BP has recovered - are resuming essential cardiac meds in a stepwise fashion   CORONARY ARTERY DISEASE / recent STEMI / recent pericardial effusion post pericardiocentesis per Cards - cont Plavix, ASA, statin - Colchicine for pericariditis resumed - Cards rec total 3 months therapy at last admit  Chronic systolic heart failure / ischemic CM per Cards - remains well compensated  OBSTRUCTIVE SLEEP APNEA Nightly CPAP   PERIPHERAL VASCULAR DISEASE/Leg ulcer ulcer on RLE - ABIs/arterial duplex 08/08/13 without evidence of stenosis, occlusion, or aneurysm bilaterally - VVS eval last admit and if pain uncontrollable only option would be amputation - due to pain unable to tolerate SCD to RLE - WOC RN evaluation done this admit  DVT prophylaxis: SCDs Code Status: Full Family Communication: no family present at time of exam today    Disposition Plan/Expected LOS: Transfer to floor - eventual dc to SNF- family request change to new facility so bed search in process  Consultants: Cardiology Trauma service  Procedures: None  Antibiotics: Rocephin 6/11 >>  Objective: Blood pressure 118/83, pulse 89, temperature 98.5 F (36.9 C), temperature source Oral, resp. rate 12, height 5\' 5"  (1.651 m), weight 213 lb 13.5 oz (97 kg), SpO2 96.00%.  Intake/Output Summary (Last 24 hours) at 08/28/13 1115 Last data filed at 08/28/13 0900  Gross per 24 hour  Intake    463 ml  Output   1050 ml  Net   -587 ml   Exam: General: No acute respiratory distress - alert and oriented  Lungs: Clear to auscultation bilaterally Cardiovascular: Regular rate and rhythm without murmur gallop or rub, trace bilateral lower extremity edema  Abdomen: Nontender, soft, bowel sounds positive, no rebound, no ascites, no appreciable mass - ecchymosis B lower abdom quad/flanks which remains stable in appearance   Musculoskeletal: No significant cyanosis, clubbing of bilateral lower extremities - R LE wound dressed  Scheduled Meds:  Scheduled Meds: . antiseptic oral rinse  15 mL Mouth Rinse BID  . aspirin  81 mg Oral Daily  . atorvastatin  80 mg Oral q1800  . carvedilol  3.125 mg Oral BID WC  . cefTRIAXone (ROCEPHIN)  IV  1 g Intravenous QHS  . Chlorhexidine Gluconate Cloth  6 each Topical Q0600  . clopidogrel  75 mg Oral Q breakfast  . colchicine  0.6 mg Oral Daily  . feeding supplement (ENSURE)  1 Container Oral BID BM  . folic acid  1 mg Oral Daily  . lisinopril  2.5 mg Oral Daily  . multivitamin with minerals  1 tablet Oral Daily  . mupirocin ointment  1 application Nasal BID  . polyethylene glycol  17 g Oral BID  . senna-docusate  1 tablet Oral BID  . sodium chloride  3 mL Intravenous Q12H  . thiamine  100 mg Oral Daily   Data Reviewed: Basic Metabolic Panel:  Recent Labs Lab 08/24/13 1315 08/25/13 0325 08/26/13 0322 08/27/13 0405 08/28/13 0240  NA 137 135* 138 136* 137  K 5.2 4.9 5.3 5.3 5.0  CL 98 97 98 98 99  CO2 25 24 28 27 25   GLUCOSE 92 117* 94 108* 101*  BUN 75* 71* 60* 43* 31*  CREATININE 1.34 1.25 1.10 0.95 0.79  CALCIUM 9.3 9.2 9.4 9.7 9.5  PHOS  --   --  3.6  --   --    Liver Function Tests:  Recent Labs Lab 08/23/13 1758 08/26/13 0322  AST 34  --   ALT 46  --   ALKPHOS 204*  --   BILITOT 0.9  --   PROT 6.7  --   ALBUMIN 2.8* 2.9*   CBC:  Recent Labs Lab 08/23/13 1758  08/24/13 1130 08/25/13 0325 08/26/13 0322 08/27/13 0405 08/28/13 0240  WBC 18.1*  < > 17.0* 16.9* 14.6* 17.4* 15.4*  NEUTROABS 15.7*  --   --   --   --   --   --   HGB 14.2  < > 14.2 13.2 14.0 14.1 13.2  HCT 44.6  < > 44.7 42.4 44.4 45.1 43.0  MCV 85.9  < > 87.0 87.2 88.4 87.9 87.9  PLT 759*  < > 784* 749* 857* 829* 801*  < > = values in this interval not displayed.  Cardiac Enzymes:  Recent Labs Lab 08/23/13 1819 08/24/13 0037  TROPONINI <0.30 <0.30   BNP (last 3  results)  Recent Labs  08/07/13 1600 08/23/13 1759  PROBNP 13375.0* 9383.0*   CBG:  Recent Labs Lab 08/23/13 1748 08/23/13 2225  GLUCAP 94 108*    Recent Results (from the past 240 hour(s))  URINE CULTURE     Status: None   Collection Time    08/23/13  6:35 PM      Result Value Ref Range Status   Specimen Description URINE, CLEAN CATCH   Final   Special Requests NONE   Final   Culture  Setup Time     Final   Value: 08/24/2013 00:35     Performed at [facility]  Colony Count     Final   Value: >=100,000 COLONIES/ML     Performed at [facility]   Culture     Final   Value: ESCHERICHIA COLI     Performed at [facility]   Report Status 08/25/2013 FINAL   Final   Organism ID, Bacteria ESCHERICHIA COLI   Final  CULTURE, BLOOD (ROUTINE X 2)     Status: None   Collection Time    08/23/13  7:37 PM      Result Value Ref Range Status   Specimen Description BLOOD RIGHT FOREARM   Final   Special Requests BOTTLES DRAWN AEROBIC AND ANAEROBIC 5CC   Final   Culture  Setup Time     Final   Value: 08/24/2013 04:36     Performed at [facility]   Culture     Final   Value:        BLOOD CULTURE RECEIVED NO GROWTH TO DATE CULTURE WILL BE HELD FOR 5 DAYS BEFORE ISSUING A FINAL NEGATIVE REPORT     Performed at [facility]   Report Status PENDING   Incomplete  CULTURE, BLOOD (ROUTINE X 2)     Status: None   Collection Time    08/23/13  7:45 PM      Result Value Ref Range Status   Specimen Description BLOOD LEFT HAND   Final   Special Requests BOTTLES DRAWN AEROBIC ONLY 5CC   Final   Culture  Setup Time     Final   Value: 08/24/2013 04:36     Performed at [facility]   Culture     Final   Value:        BLOOD CULTURE RECEIVED NO GROWTH TO DATE CULTURE WILL BE HELD FOR 5 DAYS BEFORE ISSUING A FINAL NEGATIVE REPORT     Performed at [facility]   Report Status PENDING   Incomplete     Studies:  Recent x-ray studies have  been reviewed in detail by the Attending Physician  Time spent :  35 mins  Erin Hearing, ANP [facility] For Consults/Admissions - Flow Manager - [phone] Office  [phone] Pager [phone]  On-Call/Text Page:      [email or web address]      password [facility]  08/28/2013, 11:15 AM   LOS: 5 days   I have personally examined this patient and reviewed the entire database. I have reviewed the above note, made any necessary editorial changes, and agree with its content.  Cherene Altes, MD [facility]

## 2013-08-28 NOTE — Progress Notes (Signed)
Attempt to call report nurse currently at lunch. Will return call.

## 2013-08-28 NOTE — Progress Notes (Signed)
Son Heath Lark made aware of transfer to unit 2W29

## 2013-08-28 NOTE — Progress Notes (Signed)
Report called to [facility] on 2W. Patient to be transported to 2W29 via bed

## 2013-08-28 NOTE — Progress Notes (Signed)
Pt received into room 2w29, pt oriented to room and call bell, pt states pain in his right leg at this time from the ulcers, vitals taken, will continue to monitor Rickard Rhymes, RN

## 2013-08-28 NOTE — Care Management Note (Signed)
    Page 1 of 1   08/30/2013     4:25:17 PM CARE MANAGEMENT NOTE 08/30/2013  Patient:  Fernando Lane, Fernando Lane   Account Number:  [id number]  Date Initiated:  08/24/2013  Documentation initiated by:  MAYO,HENRIETTA  Subjective/Objective Assessment:   dx hypotension; pt is resident of [facility]     Action/Plan:   Anticipated DC Date:  08/30/2013   Anticipated DC Plan:  [place] referral  Clinical Social Worker         Choice offered to / List presented to:             Status of service:  Completed, signed off Medicare Important Message given?  YES (If response is "NO", the following Medicare IM given date fields will be blank) Date Medicare IM given:  08/28/2013 Date Additional Medicare IM given:    Discharge Disposition:  [place]  Per UR Regulation:  Reviewed for med. necessity/level of care/duration of stay  If discussed at [place] of Stay Meetings, dates discussed:   08/30/2013    Comments:  08/30/13 Ellan Lambert, RN, [place] Pt discharged to SNF today, per CSW arrangements.

## 2013-08-29 DIAGNOSIS — S301XXA Contusion of abdominal wall, initial encounter: Secondary | ICD-10-CM

## 2013-08-29 LAB — BASIC METABOLIC PANEL
BUN: 25 mg/dL — AB (ref 6–23)
CALCIUM: 9.6 mg/dL (ref 8.4–10.5)
CO2: 26 meq/L (ref 19–32)
Chloride: 102 mEq/L (ref 96–112)
Creatinine, Ser: 0.75 mg/dL (ref 0.50–1.35)
GFR calc Af Amer: >90 mL/min (ref >90)
GFR calc non Af Amer: >90 mL/min (ref >90)
GLUCOSE: 103 mg/dL — AB (ref 70–99)
Potassium: 5.3 mEq/L (ref 3.7–5.3)
Sodium: 140 mEq/L (ref 137–147)

## 2013-08-29 MED ORDER — CIPROFLOXACIN HCL 250 MG PO TABS
250.0000 mg | ORAL_TABLET | Freq: Two times a day (BID) | ORAL | Status: DC
  Administered 2013-08-29 – 2013-08-30 (×2): 250 mg via ORAL
  Filled 2013-08-29 (×4): qty 1

## 2013-08-29 MED ORDER — CARVEDILOL 3.125 MG PO TABS
3.1250 mg | ORAL_TABLET | Freq: Two times a day (BID) | ORAL | Status: DC
  Administered 2013-08-30: 3.125 mg via ORAL
  Filled 2013-08-29 (×3): qty 1

## 2013-08-29 MED ORDER — CLOPIDOGREL BISULFATE 75 MG PO TABS
75.0000 mg | ORAL_TABLET | Freq: Every day | ORAL | Status: DC
  Administered 2013-08-30: 75 mg via ORAL
  Filled 2013-08-29 (×2): qty 1

## 2013-08-29 NOTE — Progress Notes (Signed)
Patient ID: Fernando Lane, male   DOB: 31-May-1950, 63 y.o.   MRN: [id number]    SUBJECTIVE:  Admitted with urosepsis and hypotension. Had spleenic contusion from fall. All HF meds held.   Tolerating low dose carvedilol and lisinopril. Lasix 20 bid added back. Denies CP/orthopnea.  Feeling better.    BMET pending.    Scheduled Meds: . antiseptic oral rinse  15 mL Mouth Rinse BID  . aspirin  81 mg Oral Daily  . atorvastatin  80 mg Oral q1800  . carvedilol  3.125 mg Oral BID WC  . cefTRIAXone (ROCEPHIN)  IV  1 g Intravenous QHS  . Chlorhexidine Gluconate Cloth  6 each Topical Q0600  . clopidogrel  75 mg Oral Q breakfast  . colchicine  0.6 mg Oral Daily  . feeding supplement (ENSURE)  1 Container Oral BID BM  . folic acid  1 mg Oral Daily  . furosemide  20 mg Oral BID  . lisinopril  2.5 mg Oral Daily  . multivitamin with minerals  1 tablet Oral Daily  . mupirocin ointment  1 application Nasal BID  . polyethylene glycol  17 g Oral BID  . senna-docusate  1 tablet Oral BID  . sodium chloride  3 mL Intravenous Q12H  . thiamine  100 mg Oral Daily   Continuous Infusions:   PRN Meds:.acetaminophen, acetaminophen, benzonatate, ondansetron (ZOFRAN) IV, ondansetron, oxyCODONE-acetaminophen    Filed Vitals:   08/28/13 1201 08/28/13 1426 08/28/13 2035 08/29/13 0937  BP: 113/69 120/70 99/74 103/64  Pulse: 81 80 83 92  Temp: 97.9 F (36.6 C) 98 F (36.7 C) 98.6 F (37 C)   TempSrc: Oral Oral Oral   Resp: 13 16 18    Height:      Weight:      SpO2: 100% 98% 96%     Intake/Output Summary (Last 24 hours) at 08/29/13 0943 Last data filed at 08/28/13 2037  Gross per 24 hour  Intake    480 ml  Output    500 ml  Net    -20 ml    LABS: Basic Metabolic Panel:  Recent Labs  08/27/13 0405 08/28/13 0240  NA 136* 137  K 5.3 5.0  CL 98 99  CO2 27 25  GLUCOSE 108* 101*  BUN 43* 31*  CREATININE 0.95 0.79  CALCIUM 9.7 9.5   Liver Function Tests: No results found for this  basename: AST, ALT, ALKPHOS, BILITOT, PROT, ALBUMIN,  in the last 72 hours No results found for this basename: LIPASE, AMYLASE,  in the last 72 hours CBC:  Recent Labs  08/27/13 0405 08/28/13 0240  WBC 17.4* 15.4*  HGB 14.1 13.2  HCT 45.1 43.0  MCV 87.9 87.9  PLT 829* 801*   Cardiac Enzymes: No results found for this basename: CKTOTAL, CKMB, CKMBINDEX, TROPONINI,  in the last 72 hours BNP: No components found with this basename: POCBNP,  D-Dimer: No results found for this basename: DDIMER,  in the last 72 hours Hemoglobin A1C: No results found for this basename: HGBA1C,  in the last 72 hours Fasting Lipid Panel: No results found for this basename: CHOL, HDL, LDLCALC, TRIG, CHOLHDL, LDLDIRECT,  in the last 72 hours Thyroid Function Tests: No results found for this basename: TSH, T4TOTAL, FREET3, T3FREE, THYROIDAB,  in the last 72 hours Anemia Panel: No results found for this basename: VITAMINB12, FOLATE, FERRITIN, TIBC, IRON, RETICCTPCT,  in the last 72 hours   PHYSICAL EXAM General: NAD Neck: Thick, JVP does not appear  elevated but difficult to see due to body habitus.  no thyromegaly or thyroid nodule.  Lungs: Clear to auscultation bilaterally with normal respiratory effort. CV: Nondisplaced PMI.  Heart regular S1/S2, no S3/S4, no murmur.  Abdomen: Soft, mild improved LUQ tenderness, no hepatosplenomegaly, no distention.  Neurologic: Alert and oriented x 3.  Psych: Normal affect. Extremities: No clubbing or cyanosis. RLE wounds. No edema  ASSESSMENT AND PLAN: 63 yo with history of polycythemia vera, HTN, and OSA presented with probable inferior MI recently with possible earlier anterior MI as well as evidence for ischemic foot (dusky toes) and acute systolic CHF.  Echo showed evidence for RV infarction and pericardial effusion.  He was treated medically and discharged to a SNF.  He returned to hospital from [facility] Thursday with hypotension and probable urosepsis.  He also had a  fall with  splenic contusion.   1. Hypotension- improved  2. UTI - on day 5/10 rocephin 3. Fall --> Splenic Contusion 4. CAD 4. Chronic Systolic HF . ECHO EF 14-43% -akinetic apex, septal wall, and inferior wall.  RV moderately dilated and with severely reduced systolic function (consistent with RV infarction).  5. PAD 6. OSA 7. CKD.  8. Lower extremity wounds  Volume status stable. Agree with add lasix 20 mg po bid. Add back lasix 40 No labs or weight available. Will check BMET now. Continue low dose carvedilol and lisinopril.   Will need follow up with Dr Aundra Dubin in HF clinic 1 week post discharge.    CLEGG,AMY NP-C  08/29/2013 9:43 AM  Patient seen and examined with Darrick Grinder, NP. We discussed all aspects of the encounter. I agree with the assessment and plan as stated above. Weight continues to fall but suspect we will need to restart lasix in am.   Doing well from HF standpoint. He is back on lasix. Would be careful not to overdiurese. Continue carvedilol and lisinopril. We will follow from a distance.   Daniel Bensimhon,MD 10:24 AM

## 2013-08-29 NOTE — Progress Notes (Signed)
CSW spoke to patient regarding SNF placement. Patient states he would go back to [place] if they will take him back. CSW called [facility] and they are able to take patient back. CSW asked patient if social worker should call family. Patient stated no. CSW continues to follow.  Jeanette Caprice, MSW, [place]

## 2013-08-29 NOTE — Progress Notes (Signed)
Pt places on CPap for the night at 10cmH20 and a 2Lt bleed in of O2. Pt tol well

## 2013-08-29 NOTE — Progress Notes (Signed)
Progress Note  Fernando Lane SNK:[id number] DOB: 1950/10/29 DOA: 08/23/2013 PCP: Gwendolyn Grant, MD  Brief narrative: 63 y.o. male with history of polycythemia vera, recent MI post cardiac cath (found to have two-vessel disease with EF of 20% which is managed medically), recent pericardiocentesis for effusion due to pericarditis, and chronic right leg stasis ulcers managed conservatively who went for routine followup with his Cardiologist when he was found to have systolic BPs in the 93X.  The patient was transferred to [facility].   After arrival to the ER the patient reported feeling weak and dizzy. He did have a fall at his nursing home prior to arrival during transfer to his wheelchair. Patient did not lose consciousness or hit his head after his fall but had some bruising on his abdomen. CT abdomen and pelvis showed contusions of his spleen and a rib fracture. Patient otherwise denied any chest pain or shortness of breath, nausea, vomiting or diarrhea. Patient's blood pressure did improve with IV boluses. UA showed features concerning for UTI so he was placed on antibiotics. Blood cultures were obtained.  HPI/Subjective: Alert and endorsed generalized malaise- no SOB, CP or significant LE pain   Assessment/Plan: Sepsis due to pyelonephritis (HR 104, WBC 16.2, confirmed UTI) -improved   Metabolic encephalopathy -due to hypotension, infection, ARF(azotemia), and probable accumulated Neurontin in setting of ARF now resolved   Pan sensitive E coli pyelonephritis  -cont empiric anbx's for a total of 10 days tx  -change to PO ciprofloxacin  Acute renal failure multifactorial: dehydration, ATN due to ACE I and diuretics, hypotension, and IV contrast for CT scan - renal fnx has now normalized w/ GFR > 90  Abdominal wall contusion / Contusion of spleen Seen by Trauma service  - stable on exam and no evidence of bleeding on CT - Hgb stable    POLYCYTHEMIA VERA w/  thrombocytosis  Hgb and platelets at baseline  Hx of HYPERTENSION BP has recovered - are resuming essential cardiac meds in a stepwise fashion   CORONARY ARTERY DISEASE / recent STEMI / recent pericardial effusion post pericardiocentesis per Cards - cont Plavix, ASA, statin - Colchicine for pericariditis resumed - Cards rec total 3 months therapy at last admit  Chronic systolic heart failure / ischemic CM per Cards - remains well compensated -continue lasix PO  OBSTRUCTIVE SLEEP APNEA Nightly CPAP   PERIPHERAL VASCULAR DISEASE/Leg ulcer ulcer on RLE - ABIs/arterial duplex 08/08/13 without evidence of stenosis, occlusion, or aneurysm bilaterally - VVS eval last admit and if pain uncontrollable only option would be amputation - due to pain unable to tolerate SCD to RLE - WOC RN evaluation done this admit  DVT prophylaxis: SCDs Code Status: Full Family Communication: no family present at time of exam today    Disposition Plan/Expected LOS: SNF tomorrow if stable  Consultants: Cardiology Trauma service  Procedures: None  Antibiotics: Rocephin 6/11 >>6/17  Objective: Blood pressure 103/64, pulse 92, temperature 98.6 F (37 C), temperature source Oral, resp. rate 18, height 5\' 5"  (1.651 m), weight 97 kg (213 lb 13.5 oz), SpO2 96.00%.  Intake/Output Summary (Last 24 hours) at 08/29/13 1409 Last data filed at 08/29/13 1322  Gross per 24 hour  Intake    720 ml  Output   1200 ml  Net   -480 ml   Exam: General: No acute respiratory distress - alert and oriented  Lungs: Clear to auscultation bilaterally Cardiovascular: Regular rate and rhythm without murmur gallop or rub, trace bilateral  lower extremity edema  Abdomen: Nontender, soft, bowel sounds positive, no rebound, no ascites, no appreciable mass - ecchymosis B lower abdom quad/flanks which remains stable in appearance  Musculoskeletal: No significant cyanosis, clubbing of bilateral lower extremities - R LE wound  dressed  Scheduled Meds:  Scheduled Meds: . antiseptic oral rinse  15 mL Mouth Rinse BID  . aspirin  81 mg Oral Daily  . atorvastatin  80 mg Oral q1800  . carvedilol  3.125 mg Oral BID WC  . Chlorhexidine Gluconate Cloth  6 each Topical Q0600  . ciprofloxacin  250 mg Oral BID  . clopidogrel  75 mg Oral Q breakfast  . colchicine  0.6 mg Oral Daily  . feeding supplement (ENSURE)  1 Container Oral BID BM  . folic acid  1 mg Oral Daily  . furosemide  20 mg Oral BID  . lisinopril  2.5 mg Oral Daily  . multivitamin with minerals  1 tablet Oral Daily  . mupirocin ointment  1 application Nasal BID  . polyethylene glycol  17 g Oral BID  . senna-docusate  1 tablet Oral BID  . sodium chloride  3 mL Intravenous Q12H  . thiamine  100 mg Oral Daily   Data Reviewed: Basic Metabolic Panel:  Recent Labs Lab 08/25/13 0325 08/26/13 0322 08/27/13 0405 08/28/13 0240 08/29/13 1225  NA 135* 138 136* 137 140  K 4.9 5.3 5.3 5.0 5.3  CL 97 98 98 99 102  CO2 24 28 27 25 26   GLUCOSE 117* 94 108* 101* 103*  BUN 71* 60* 43* 31* 25*  CREATININE 1.25 1.10 0.95 0.79 0.75  CALCIUM 9.2 9.4 9.7 9.5 9.6  PHOS  --  3.6  --   --   --    Liver Function Tests:  Recent Labs Lab 08/23/13 1758 08/26/13 0322  AST 34  --   ALT 46  --   ALKPHOS 204*  --   BILITOT 0.9  --   PROT 6.7  --   ALBUMIN 2.8* 2.9*   CBC:  Recent Labs Lab 08/23/13 1758  08/24/13 1130 08/25/13 0325 08/26/13 0322 08/27/13 0405 08/28/13 0240  WBC 18.1*  < > 17.0* 16.9* 14.6* 17.4* 15.4*  NEUTROABS 15.7*  --   --   --   --   --   --   HGB 14.2  < > 14.2 13.2 14.0 14.1 13.2  HCT 44.6  < > 44.7 42.4 44.4 45.1 43.0  MCV 85.9  < > 87.0 87.2 88.4 87.9 87.9  PLT 759*  < > 784* 749* 857* 829* 801*  < > = values in this interval not displayed.  Cardiac Enzymes:  Recent Labs Lab 08/23/13 1819 08/24/13 0037  TROPONINI <0.30 <0.30   BNP (last 3 results)  Recent Labs  08/07/13 1600 08/23/13 1759  PROBNP 13375.0*  9383.0*   CBG:  Recent Labs Lab 08/23/13 1748 08/23/13 2225  GLUCAP 94 108*    Recent Results (from the past 240 hour(s))  URINE CULTURE     Status: None   Collection Time    08/23/13  6:35 PM      Result Value Ref Range Status   Specimen Description URINE, CLEAN CATCH   Final   Special Requests NONE   Final   Culture  Setup Time     Final   Value: 08/24/2013 00:35     Performed at [place]     Final   Value: >=100,000 COLONIES/ML  Performed at [facility]     Final   Value: ESCHERICHIA COLI     Performed at [facility]   Report Status 08/25/2013 FINAL   Final   Organism ID, Bacteria ESCHERICHIA COLI   Final  CULTURE, BLOOD (ROUTINE X 2)     Status: None   Collection Time    08/23/13  7:37 PM      Result Value Ref Range Status   Specimen Description BLOOD RIGHT FOREARM   Final   Special Requests BOTTLES DRAWN AEROBIC AND ANAEROBIC 5CC   Final   Culture  Setup Time     Final   Value: 08/24/2013 04:36     Performed at [facility]   Culture     Final   Value:        BLOOD CULTURE RECEIVED NO GROWTH TO DATE CULTURE WILL BE HELD FOR 5 DAYS BEFORE ISSUING A FINAL NEGATIVE REPORT     Performed at [facility]   Report Status PENDING   Incomplete  CULTURE, BLOOD (ROUTINE X 2)     Status: None   Collection Time    08/23/13  7:45 PM      Result Value Ref Range Status   Specimen Description BLOOD LEFT HAND   Final   Special Requests BOTTLES DRAWN AEROBIC ONLY 5CC   Final   Culture  Setup Time     Final   Value: 08/24/2013 04:36     Performed at [facility]   Culture     Final   Value:        BLOOD CULTURE RECEIVED NO GROWTH TO DATE CULTURE WILL BE HELD FOR 5 DAYS BEFORE ISSUING A FINAL NEGATIVE REPORT     Performed at [facility]   Report Status PENDING   Incomplete     Studies:   Domenic Polite, MD [phone]

## 2013-08-30 LAB — CULTURE, BLOOD (ROUTINE X 2)
CULTURE: NO GROWTH
Culture: NO GROWTH

## 2013-08-30 LAB — BASIC METABOLIC PANEL
BUN: 21 mg/dL (ref 6–23)
CALCIUM: 9.4 mg/dL (ref 8.4–10.5)
CO2: 26 mEq/L (ref 19–32)
Chloride: 100 mEq/L (ref 96–112)
Creatinine, Ser: 0.8 mg/dL (ref 0.50–1.35)
GFR calc Af Amer: >90 mL/min (ref >90)
Glucose, Bld: 92 mg/dL (ref 70–99)
Potassium: 4.7 mEq/L (ref 3.7–5.3)
Sodium: 138 mEq/L (ref 137–147)

## 2013-08-30 MED ORDER — CIPROFLOXACIN HCL 250 MG PO TABS: 250.0000 mg | ORAL_TABLET | Freq: Two times a day (BID) | ORAL | Status: DC

## 2013-08-30 MED ORDER — ANAGRELIDE HCL 0.5 MG PO CAPS: 0.5000 mg | ORAL_CAPSULE | Freq: Two times a day (BID) | ORAL | Status: DC

## 2013-08-30 MED ORDER — FUROSEMIDE 20 MG PO TABS
20.0000 mg | ORAL_TABLET | Freq: Two times a day (BID) | ORAL | Status: DC
Start: 2013-08-30 — End: 2013-11-16

## 2013-08-30 MED ORDER — OXYCODONE-ACETAMINOPHEN 5-325 MG PO TABS: 1.0000 | ORAL_TABLET | Freq: Four times a day (QID) | ORAL | Status: DC | PRN

## 2013-08-30 NOTE — Progress Notes (Signed)
Called report to [facility] and spoke with Jeannene Patella, LPN.   Eulis Canner, RN

## 2013-08-30 NOTE — Progress Notes (Addendum)
Update: Patient's son states he cannot bring his dad and needs an alternative transport. CSW explained Education officer, museum can arrange for EMS. Patient's son states that is fine. CSW will set up transport for pickup at 2pm.   Clinical Social Worker facilitated patient discharge including contacting patient and facility to confirm patient discharge plans.  Patient states he has already talked to family and they are bringing him back to [facility] facility this afternoon. Clinical information faxed to facility and family agreeable with plan.    Clinical Social Worker will sign off for now as social work intervention is no longer needed. Please consult [place] again if new need arises.  Jeanette Caprice, MSW, [place]
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
## 2013-09-07 ENCOUNTER — Telehealth: Payer: Self-pay | Admitting: Internal Medicine

## 2013-09-07 NOTE — Telephone Encounter (Signed)
s.w. pt and r/s appt from 7.16 to 7.17.Marland KitchenMarland KitchenMarland Kitchenpt ok with d/t

## 2013-09-13 ENCOUNTER — Telehealth: Payer: Self-pay

## 2013-09-13 NOTE — Telephone Encounter (Signed)
Pt called thinking his appt was today. He is in rehab. Rehab does not have transportation. Pt appt is actually the 17th. Pt may still be in rehab at that time. I asked pt to call when closer to appt date to give [place] an update. He states he does not have anyone to ask to bring him to [facility] for his appt.

## 2013-09-26 ENCOUNTER — Telehealth: Payer: Self-pay | Admitting: Internal Medicine

## 2013-09-26 NOTE — Telephone Encounter (Signed)
returned pt call...pt in rehab in [place] and he will not be able to come...will call back to r/s once out of rehab

## 2013-09-27 ENCOUNTER — Ambulatory Visit: Payer: Medicare Other

## 2013-09-27 ENCOUNTER — Other Ambulatory Visit: Payer: Medicare Other

## 2013-09-28 ENCOUNTER — Ambulatory Visit: Payer: Medicare Other

## 2013-09-28 ENCOUNTER — Other Ambulatory Visit: Payer: Medicare Other

## 2013-10-10 ENCOUNTER — Encounter: Payer: Self-pay | Admitting: Vascular Surgery

## 2013-10-11 ENCOUNTER — Encounter: Payer: Self-pay | Admitting: Vascular Surgery

## 2013-10-11 ENCOUNTER — Ambulatory Visit (INDEPENDENT_AMBULATORY_CARE_PROVIDER_SITE_OTHER): Payer: Medicare Other | Admitting: Vascular Surgery

## 2013-10-11 VITALS — BP 115/70 | HR 77 | Ht 65.0 in | Wt 233.0 lb

## 2013-10-11 DIAGNOSIS — L98499 Non-pressure chronic ulcer of skin of other sites with unspecified severity: Principal | ICD-10-CM

## 2013-10-11 DIAGNOSIS — I739 Peripheral vascular disease, unspecified: Secondary | ICD-10-CM

## 2013-10-11 DIAGNOSIS — I7025 Atherosclerosis of native arteries of other extremities with ulceration: Secondary | ICD-10-CM | POA: Insufficient documentation

## 2013-10-11 NOTE — Progress Notes (Signed)
Patient is a 62 year old male who presents today with gangrenous changes of toes on the right foot. He previously underwent an arteriogram may 27th 2015 which showed unreconstructable tibial artery occlusive disease. The patient was referred for further management. He states that he does have pain in his right foot but it is not intolerable for him. He denies any fever or chills. He has not had any significant progression of the wounds in his right foot. He is minimally ambulatory. He also has a history of polycythemia sleep apnea coronary artery disease and pericarditis. He also has an ejection fraction of 20%. These are currently stable. He is on Plavix and aspirin.  Review of systems: Patient develops shortness of breath at rest. He states he has also been more edematous in his lower extremities recently.  Past Medical History  Diagnosis Date  . Chronic low back pain   . 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 Chronic neck pain   . OSA (obstructive sleep apnea)     CPAP @ bedtime  . Allergy   . Unspecified essential hypertension     Resistant, 2D Echo - EF-55-60  . Polycythemia   . CAD (coronary artery disease)     Vessel type unspecified  . PVD (peripheral vascular disease)   . DDD (degenerative disc disease), cervical     Past Surgical History  Procedure Laterality Date  . Lower extrmity doppler      [place] 2008; no evidence for PAD  . US echocardiography  11/2008    Normal valves, mild LAE  . Left knee replacement  10/2006  . Cervical spine surgery  02/2008  . Joint replacement    . Spine surgery      Current Outpatient Prescriptions on File Prior to Visit  Medication Sig Dispense Refill  . anagrelide (AGRYLIN) 0.5 MG capsule Take 1 capsule (0.5 mg total) by mouth 2 (two) times daily.      Marland Kitchen aspirin 81 MG chewable tablet Chew 1 tablet (81 mg total) by mouth daily.      Marland Kitchen atorvastatin (LIPITOR) 80 MG tablet Take 1 tablet (80 mg total)  by mouth daily at 6 PM.      . carvedilol (COREG) 12.5 MG tablet Take 1 tablet (12.5 mg total) by mouth 2 (two) times daily with a meal.      . chlorhexidine (PERIDEX) 0.12 % solution Use as directed 15 mLs in the mouth or throat daily.      . chlorpheniramine-HYDROcodone (TUSSIONEX) 10-8 MG/5ML LQCR Take 5 mLs by mouth every 12 (twelve) hours as needed for cough.    0  . ciprofloxacin (CIPRO) 250 MG tablet Take 1 tablet (250 mg total) by mouth 2 (two) times daily. For 3 days  1 tablet  0  . clopidogrel (PLAVIX) 75 MG tablet Take 75 mg by mouth daily at 6 PM.      . colchicine 0.6 MG tablet Take 0.6 mg by mouth daily at 6 PM.      . feeding supplement, ENSURE COMPLETE, (ENSURE COMPLETE) LIQD Take 237 mLs by mouth 2 (two) times daily between meals.      . furosemide (LASIX) 20 MG tablet Take 1 tablet (20 mg total) by mouth 2 (two) times daily.  1 tablet    . gabapentin (NEURONTIN) 300 MG capsule Take 1 capsule (300 mg total) by mouth 3 (three) times daily.  90 capsule  3  . lisinopril (PRINIVIL,ZESTRIL) 5 MG tablet Take 1  tablet (5 mg total) by mouth daily.      Marland Kitchen oxyCODONE-acetaminophen (PERCOCET/ROXICET) 5-325 MG per tablet Take 1 tablet by mouth every 6 (six) hours as needed for severe pain.  30 tablet  0  . thiamine 100 MG tablet Take 1 tablet (100 mg total) by mouth daily.       No current facility-administered medications on file prior to visit.    No Known Allergies  Physical exam:  Filed Vitals:   10/11/13 1530  BP: 115/70  Pulse: 77  Height: 5\' 5"  (1.651 m)  Weight: 233 lb (105.688 kg)  SpO2: 96%    Extremities: 2+ femoral pulses bilaterally absent pedal pulses, early gangrenous changes toes 4 and 5 right foot no surrounding erythema no purulent drainage  Chest: Clear to auscultation bilaterally  Cardiac: Regular rate and rhythm  Data: I reviewed the patient's arteriogram from 08/08/2013. He had in-line flow via the anterior tibial artery to the right foot. He had  occlusion of the peroneal and posterior tibial arteries.  Assessment: Dry gangrenous changes toes right foot with unreconstructable tibial artery occlusive disease  Plan: The patient was offered a right below-knee amputation versus continued observation. I discussed with the patient that if he had evidence of ascending worsening infection or unrelenting pain from his right foot that that would be an indication to do the amputation. This is currently not an emergency. He will call if he wishes to schedule a right below-knee amputation electively at some point future. If he decides to proceed with amputation we would need to stop his Plavix 7-10 days prior to the procedure but it would be okay for him to continue his aspirin.  Ruta Hinds, MD [facility] Office: [phone] Pager: [phone]

## 2013-10-15 ENCOUNTER — Telehealth: Payer: Self-pay | Admitting: Dietician

## 2013-10-15 NOTE — Telephone Encounter (Signed)
error 

## 2013-11-01 ENCOUNTER — Ambulatory Visit: Payer: Medicare Other | Admitting: Vascular Surgery

## 2013-11-15 ENCOUNTER — Inpatient Hospital Stay (HOSPITAL_COMMUNITY): Payer: Medicare Other

## 2013-11-15 ENCOUNTER — Inpatient Hospital Stay (HOSPITAL_COMMUNITY)
Admission: EM | Admit: 2013-11-15 | Discharge: 2013-12-04 | DRG: 853 | Disposition: A | Discharge status: Skilled Nursing Facility | Payer: Medicare Other | Source: Other Acute Inpatient Hospital | Attending: Internal Medicine | Admitting: Internal Medicine

## 2013-11-15 DIAGNOSIS — M545 Low back pain, unspecified: Secondary | ICD-10-CM | POA: Diagnosis present

## 2013-11-15 DIAGNOSIS — Z7902 Long term (current) use of antithrombotics/antiplatelets: Secondary | ICD-10-CM | POA: Diagnosis not present

## 2013-11-15 DIAGNOSIS — E872 Acidosis, unspecified: Secondary | ICD-10-CM | POA: Diagnosis present

## 2013-11-15 DIAGNOSIS — E876 Hypokalemia: Secondary | ICD-10-CM | POA: Diagnosis present

## 2013-11-15 DIAGNOSIS — L97919 Non-pressure chronic ulcer of unspecified part of right lower leg with unspecified severity: Secondary | ICD-10-CM

## 2013-11-15 DIAGNOSIS — A419 Sepsis, unspecified organism: Secondary | ICD-10-CM | POA: Diagnosis present

## 2013-11-15 DIAGNOSIS — N5089 Other specified disorders of the male genital organs: Secondary | ICD-10-CM | POA: Diagnosis present

## 2013-11-15 DIAGNOSIS — J189 Pneumonia, unspecified organism: Secondary | ICD-10-CM | POA: Diagnosis present

## 2013-11-15 DIAGNOSIS — R0602 Shortness of breath: Secondary | ICD-10-CM | POA: Diagnosis present

## 2013-11-15 DIAGNOSIS — G9341 Metabolic encephalopathy: Secondary | ICD-10-CM

## 2013-11-15 DIAGNOSIS — R1310 Dysphagia, unspecified: Secondary | ICD-10-CM | POA: Diagnosis present

## 2013-11-15 DIAGNOSIS — I498 Other specified cardiac arrhythmias: Secondary | ICD-10-CM | POA: Diagnosis present

## 2013-11-15 DIAGNOSIS — I9589 Other hypotension: Secondary | ICD-10-CM

## 2013-11-15 DIAGNOSIS — J9601 Acute respiratory failure with hypoxia: Secondary | ICD-10-CM

## 2013-11-15 DIAGNOSIS — I252 Old myocardial infarction: Secondary | ICD-10-CM | POA: Diagnosis not present

## 2013-11-15 DIAGNOSIS — E8779 Other fluid overload: Secondary | ICD-10-CM

## 2013-11-15 DIAGNOSIS — R7309 Other abnormal glucose: Secondary | ICD-10-CM | POA: Diagnosis present

## 2013-11-15 DIAGNOSIS — Z79899 Other long term (current) drug therapy: Secondary | ICD-10-CM

## 2013-11-15 DIAGNOSIS — D751 Secondary polycythemia: Secondary | ICD-10-CM

## 2013-11-15 DIAGNOSIS — E8770 Fluid overload, unspecified: Secondary | ICD-10-CM

## 2013-11-15 DIAGNOSIS — R1311 Dysphagia, oral phase: Secondary | ICD-10-CM | POA: Diagnosis present

## 2013-11-15 DIAGNOSIS — L89109 Pressure ulcer of unspecified part of back, unspecified stage: Secondary | ICD-10-CM | POA: Diagnosis present

## 2013-11-15 DIAGNOSIS — L8990 Pressure ulcer of unspecified site, unspecified stage: Secondary | ICD-10-CM | POA: Diagnosis present

## 2013-11-15 DIAGNOSIS — Z87891 Personal history of nicotine dependence: Secondary | ICD-10-CM

## 2013-11-15 DIAGNOSIS — I5023 Acute on chronic systolic (congestive) heart failure: Secondary | ICD-10-CM

## 2013-11-15 DIAGNOSIS — I70269 Atherosclerosis of native arteries of extremities with gangrene, unspecified extremity: Secondary | ICD-10-CM | POA: Diagnosis present

## 2013-11-15 DIAGNOSIS — G894 Chronic pain syndrome: Secondary | ICD-10-CM

## 2013-11-15 DIAGNOSIS — G4733 Obstructive sleep apnea (adult) (pediatric): Secondary | ICD-10-CM | POA: Diagnosis present

## 2013-11-15 DIAGNOSIS — N4889 Other specified disorders of penis: Secondary | ICD-10-CM | POA: Diagnosis present

## 2013-11-15 DIAGNOSIS — Z7982 Long term (current) use of aspirin: Secondary | ICD-10-CM | POA: Diagnosis not present

## 2013-11-15 DIAGNOSIS — I5043 Acute on chronic combined systolic (congestive) and diastolic (congestive) heart failure: Secondary | ICD-10-CM

## 2013-11-15 DIAGNOSIS — J9620 Acute and chronic respiratory failure, unspecified whether with hypoxia or hypercapnia: Secondary | ICD-10-CM | POA: Diagnosis present

## 2013-11-15 DIAGNOSIS — I10 Essential (primary) hypertension: Secondary | ICD-10-CM

## 2013-11-15 DIAGNOSIS — L98499 Non-pressure chronic ulcer of skin of other sites with unspecified severity: Secondary | ICD-10-CM

## 2013-11-15 DIAGNOSIS — R6520 Severe sepsis without septic shock: Secondary | ICD-10-CM | POA: Diagnosis present

## 2013-11-15 DIAGNOSIS — I739 Peripheral vascular disease, unspecified: Secondary | ICD-10-CM

## 2013-11-15 DIAGNOSIS — G8929 Other chronic pain: Secondary | ICD-10-CM | POA: Diagnosis present

## 2013-11-15 DIAGNOSIS — J449 Chronic obstructive pulmonary disease, unspecified: Secondary | ICD-10-CM | POA: Diagnosis present

## 2013-11-15 DIAGNOSIS — I509 Heart failure, unspecified: Secondary | ICD-10-CM | POA: Diagnosis present

## 2013-11-15 DIAGNOSIS — R5381 Other malaise: Secondary | ICD-10-CM | POA: Diagnosis present

## 2013-11-15 DIAGNOSIS — I248 Other forms of acute ischemic heart disease: Secondary | ICD-10-CM | POA: Diagnosis present

## 2013-11-15 DIAGNOSIS — I5022 Chronic systolic (congestive) heart failure: Secondary | ICD-10-CM

## 2013-11-15 DIAGNOSIS — L97909 Non-pressure chronic ulcer of unspecified part of unspecified lower leg with unspecified severity: Secondary | ICD-10-CM

## 2013-11-15 DIAGNOSIS — A0472 Enterocolitis due to Clostridium difficile, not specified as recurrent: Secondary | ICD-10-CM | POA: Diagnosis present

## 2013-11-15 DIAGNOSIS — I2510 Atherosclerotic heart disease of native coronary artery without angina pectoris: Secondary | ICD-10-CM | POA: Diagnosis present

## 2013-11-15 DIAGNOSIS — I2489 Other forms of acute ischemic heart disease: Secondary | ICD-10-CM | POA: Diagnosis present

## 2013-11-15 DIAGNOSIS — N179 Acute kidney failure, unspecified: Secondary | ICD-10-CM | POA: Diagnosis present

## 2013-11-15 DIAGNOSIS — Z96659 Presence of unspecified artificial knee joint: Secondary | ICD-10-CM

## 2013-11-15 DIAGNOSIS — D45 Polycythemia vera: Secondary | ICD-10-CM | POA: Diagnosis present

## 2013-11-15 DIAGNOSIS — R578 Other shock: Secondary | ICD-10-CM | POA: Diagnosis present

## 2013-11-15 DIAGNOSIS — I25810 Atherosclerosis of coronary artery bypass graft(s) without angina pectoris: Secondary | ICD-10-CM

## 2013-11-15 DIAGNOSIS — I519 Heart disease, unspecified: Secondary | ICD-10-CM | POA: Diagnosis present

## 2013-11-15 DIAGNOSIS — Z66 Do not resuscitate: Secondary | ICD-10-CM

## 2013-11-15 DIAGNOSIS — I50810 Right heart failure, unspecified: Secondary | ICD-10-CM

## 2013-11-15 DIAGNOSIS — N390 Urinary tract infection, site not specified: Secondary | ICD-10-CM | POA: Diagnosis not present

## 2013-11-15 DIAGNOSIS — R570 Cardiogenic shock: Secondary | ICD-10-CM | POA: Diagnosis present

## 2013-11-15 DIAGNOSIS — I7025 Atherosclerosis of native arteries of other extremities with ulceration: Secondary | ICD-10-CM | POA: Diagnosis present

## 2013-11-15 DIAGNOSIS — Z6832 Body mass index (BMI) 32.0-32.9, adult: Secondary | ICD-10-CM

## 2013-11-15 DIAGNOSIS — R4702 Dysphasia: Secondary | ICD-10-CM

## 2013-11-15 DIAGNOSIS — A498 Other bacterial infections of unspecified site: Secondary | ICD-10-CM | POA: Diagnosis present

## 2013-11-15 DIAGNOSIS — I255 Ischemic cardiomyopathy: Secondary | ICD-10-CM | POA: Diagnosis present

## 2013-11-15 DIAGNOSIS — E46 Unspecified protein-calorie malnutrition: Secondary | ICD-10-CM | POA: Diagnosis present

## 2013-11-15 DIAGNOSIS — R579 Shock, unspecified: Secondary | ICD-10-CM

## 2013-11-15 DIAGNOSIS — I959 Hypotension, unspecified: Secondary | ICD-10-CM | POA: Diagnosis present

## 2013-11-15 DIAGNOSIS — R6521 Severe sepsis with septic shock: Secondary | ICD-10-CM

## 2013-11-15 DIAGNOSIS — J9600 Acute respiratory failure, unspecified whether with hypoxia or hypercapnia: Secondary | ICD-10-CM | POA: Diagnosis present

## 2013-11-15 DIAGNOSIS — I96 Gangrene, not elsewhere classified: Secondary | ICD-10-CM | POA: Diagnosis present

## 2013-11-15 DIAGNOSIS — J4489 Other specified chronic obstructive pulmonary disease: Secondary | ICD-10-CM | POA: Diagnosis present

## 2013-11-15 LAB — MRSA PCR SCREENING: MRSA by PCR: POSITIVE — AB

## 2013-11-15 LAB — URINALYSIS, ROUTINE W REFLEX MICROSCOPIC
Glucose, UA: 100 mg/dL — AB
Ketones, ur: 15 mg/dL — AB
NITRITE: NEGATIVE
Specific Gravity, Urine: 1.024 (ref 1.005–1.030)
Urobilinogen, UA: 0.2 mg/dL (ref 0.0–1.0)
pH: 5 (ref 5.0–8.0)

## 2013-11-15 LAB — CBC WITH DIFFERENTIAL/PLATELET
Basophils Absolute: 0 10*3/uL (ref 0.0–0.1)
Basophils Relative: 0 % (ref 0–1)
Eosinophils Absolute: 0 10*3/uL (ref 0.0–0.7)
Eosinophils Relative: 0 % (ref 0–5)
HCT: 48.8 % (ref 39.0–52.0)
Hemoglobin: 15.6 g/dL (ref 13.0–17.0)
LYMPHS ABS: 0.9 10*3/uL (ref 0.7–4.0)
Lymphocytes Relative: 2 % — ABNORMAL LOW (ref 12–46)
MCH: 29.2 pg (ref 26.0–34.0)
MCHC: 32 g/dL (ref 30.0–36.0)
MCV: 91.4 fL (ref 78.0–100.0)
MONO ABS: 0.9 10*3/uL (ref 0.1–1.0)
Monocytes Relative: 2 % — ABNORMAL LOW (ref 3–12)
NEUTROS PCT: 96 % — AB (ref 43–77)
Neutro Abs: 45.3 10*3/uL — ABNORMAL HIGH (ref 1.7–7.7)
PLATELETS: 829 10*3/uL — AB (ref 150–400)
RBC: 5.34 MIL/uL (ref 4.22–5.81)
RDW: 18.4 % — ABNORMAL HIGH (ref 11.5–15.5)
WBC: 47.1 10*3/uL — AB (ref 4.0–10.5)

## 2013-11-15 LAB — COMPREHENSIVE METABOLIC PANEL
ALBUMIN: 2.4 g/dL — AB (ref 3.5–5.2)
ALT: 14 U/L (ref 0–53)
AST: 19 U/L (ref 0–37)
Alkaline Phosphatase: 118 U/L — ABNORMAL HIGH (ref 39–117)
Anion gap: 20 — ABNORMAL HIGH (ref 5–15)
BUN: 51 mg/dL — ABNORMAL HIGH (ref 6–23)
CALCIUM: 7.5 mg/dL — AB (ref 8.4–10.5)
CO2: 18 mEq/L — ABNORMAL LOW (ref 19–32)
CREATININE: 3.42 mg/dL — AB (ref 0.50–1.35)
Chloride: 99 mEq/L (ref 96–112)
GFR calc Af Amer: 20 mL/min — ABNORMAL LOW (ref >90)
GFR calc non Af Amer: 18 mL/min — ABNORMAL LOW (ref >90)
Glucose, Bld: 229 mg/dL — ABNORMAL HIGH (ref 70–99)
Potassium: 4 mEq/L (ref 3.7–5.3)
Sodium: 137 mEq/L (ref 137–147)
Total Bilirubin: 1 mg/dL (ref 0.3–1.2)
Total Protein: 5.1 g/dL — ABNORMAL LOW (ref 6.0–8.3)

## 2013-11-15 LAB — URINE MICROSCOPIC-ADD ON

## 2013-11-15 LAB — POCT I-STAT 3, ART BLOOD GAS (G3+)
Acid-base deficit: 10 mmol/L — ABNORMAL HIGH (ref 0.0–2.0)
BICARBONATE: 18.5 meq/L — AB (ref 20.0–24.0)
O2 Saturation: 96 %
PCO2 ART: 47.4 mmHg — AB (ref 35.0–45.0)
PH ART: 7.2 — AB (ref 7.350–7.450)
Patient temperature: 98.7
TCO2: 20 mmol/L (ref 0–100)
pO2, Arterial: 97 mmHg (ref 80.0–100.0)

## 2013-11-15 LAB — PRO B NATRIURETIC PEPTIDE: Pro B Natriuretic peptide (BNP): 9486 pg/mL — ABNORMAL HIGH (ref 0–125)

## 2013-11-15 LAB — PROCALCITONIN: PROCALCITONIN: 0.26 ng/mL

## 2013-11-15 LAB — PROTIME-INR
INR: 1.59 — AB (ref 0.00–1.49)
Prothrombin Time: 19 seconds — ABNORMAL HIGH (ref 11.6–15.2)

## 2013-11-15 LAB — TROPONIN I: Troponin I: 0.75 ng/mL (ref <0.30)

## 2013-11-15 LAB — LIPASE, BLOOD: LIPASE: 7 U/L — AB (ref 11–59)

## 2013-11-15 LAB — GLUCOSE, CAPILLARY: Glucose-Capillary: 188 mg/dL — ABNORMAL HIGH (ref 70–99)

## 2013-11-15 LAB — AMYLASE: Amylase: 151 U/L — ABNORMAL HIGH (ref 0–105)

## 2013-11-15 LAB — LACTIC ACID, PLASMA: Lactic Acid, Venous: 2.4 mmol/L — ABNORMAL HIGH (ref 0.5–2.2)

## 2013-11-15 MED ORDER — VANCOMYCIN HCL 10 G IV SOLR
1500.0000 mg | INTRAVENOUS | Status: DC
  Administered 2013-11-17: 1500 mg via INTRAVENOUS
  Filled 2013-11-15 (×2): qty 1500

## 2013-11-15 MED ORDER — NOREPINEPHRINE BITARTRATE 1 MG/ML IV SOLN
2.0000 ug/min | INTRAVENOUS | Status: DC
  Administered 2013-11-15: 50 ug/min via INTRAVENOUS
  Administered 2013-11-16: 20 ug/min via INTRAVENOUS
  Administered 2013-11-17: 10 ug/min via INTRAVENOUS
  Administered 2013-11-18: 8 ug/min via INTRAVENOUS
  Filled 2013-11-15 (×4): qty 16

## 2013-11-15 MED ORDER — SODIUM CHLORIDE 0.9 % IV SOLN: 250.0000 mL | INTRAVENOUS | Status: DC | PRN

## 2013-11-15 MED ORDER — HYDROCORTISONE NA SUCCINATE PF 100 MG IJ SOLR
50.0000 mg | Freq: Four times a day (QID) | INTRAMUSCULAR | Status: DC
  Administered 2013-11-15 – 2013-11-16 (×3): 50 mg via INTRAVENOUS
  Filled 2013-11-15 (×7): qty 1

## 2013-11-15 MED ORDER — VANCOMYCIN 50 MG/ML ORAL SOLUTION: 125.0000 mg | ORAL | Status: DC

## 2013-11-15 MED ORDER — PANTOPRAZOLE SODIUM 40 MG IV SOLR
40.0000 mg | Freq: Every day | INTRAVENOUS | Status: DC
  Administered 2013-11-15: 40 mg via INTRAVENOUS
  Filled 2013-11-15: qty 40

## 2013-11-15 MED ORDER — ALBUTEROL SULFATE (2.5 MG/3ML) 0.083% IN NEBU: 2.5000 mg | INHALATION_SOLUTION | RESPIRATORY_TRACT | Status: DC

## 2013-11-15 MED ORDER — ASPIRIN 300 MG RE SUPP: 300.0000 mg | RECTAL | Status: AC

## 2013-11-15 MED ORDER — VANCOMYCIN 50 MG/ML ORAL SOLUTION: 125.0000 mg | Freq: Two times a day (BID) | ORAL | Status: DC

## 2013-11-15 MED ORDER — SODIUM BICARBONATE 8.4 % IV SOLN
INTRAVENOUS | Status: AC
  Administered 2013-11-15: 20:00:00
  Filled 2013-11-15: qty 50

## 2013-11-15 MED ORDER — FENTANYL CITRATE 0.05 MG/ML IJ SOLN: 100.0000 ug | INTRAMUSCULAR | Status: DC | PRN

## 2013-11-15 MED ORDER — PIPERACILLIN-TAZOBACTAM 3.375 G IVPB 30 MIN
3.3750 g | INTRAVENOUS | Status: AC
Start: 2013-11-15 — End: 2013-11-15
  Administered 2013-11-15: 3.375 g via INTRAVENOUS
  Filled 2013-11-15: qty 50

## 2013-11-15 MED ORDER — VANCOMYCIN 50 MG/ML ORAL SOLUTION
500.0000 mg | Freq: Four times a day (QID) | ORAL | Status: DC
  Administered 2013-11-15 – 2013-11-16 (×2): 500 mg via ORAL
  Filled 2013-11-15 (×6): qty 10

## 2013-11-15 MED ORDER — INSULIN ASPART 100 UNIT/ML ~~LOC~~ SOLN
2.0000 [IU] | SUBCUTANEOUS | Status: DC
  Administered 2013-11-16 (×2): 2 [IU] via SUBCUTANEOUS
  Administered 2013-11-16: 4 [IU] via SUBCUTANEOUS
  Administered 2013-11-16 – 2013-11-17 (×7): 2 [IU] via SUBCUTANEOUS

## 2013-11-15 MED ORDER — STERILE WATER FOR INJECTION IV SOLN
INTRAVENOUS | Status: DC
  Administered 2013-11-15: 21:00:00 via INTRAVENOUS
  Filled 2013-11-15 (×3): qty 850

## 2013-11-15 MED ORDER — IPRATROPIUM-ALBUTEROL 0.5-2.5 (3) MG/3ML IN SOLN: 3.0000 mL | RESPIRATORY_TRACT | Status: DC | PRN

## 2013-11-15 MED ORDER — DOPAMINE-DEXTROSE 3.2-5 MG/ML-% IV SOLN: 2.0000 ug/kg/min | INTRAVENOUS | Status: DC

## 2013-11-15 MED ORDER — HEPARIN SODIUM (PORCINE) 5000 UNIT/ML IJ SOLN
5000.0000 [IU] | Freq: Three times a day (TID) | INTRAMUSCULAR | Status: DC
  Administered 2013-11-15 – 2013-11-20 (×14): 5000 [IU] via SUBCUTANEOUS
  Filled 2013-11-15 (×16): qty 1

## 2013-11-15 MED ORDER — NOREPINEPHRINE BITARTRATE 1 MG/ML IV SOLN
2.0000 ug/min | INTRAVENOUS | Status: DC
  Administered 2013-11-15: 40 ug/min via INTRAVENOUS
  Filled 2013-11-15: qty 4

## 2013-11-15 MED ORDER — FAMOTIDINE IN NACL 20-0.9 MG/50ML-% IV SOLN
20.0000 mg | Freq: Every day | INTRAVENOUS | Status: DC
Start: 2013-11-16 — End: 2013-11-20
  Administered 2013-11-16 – 2013-11-19 (×5): 20 mg via INTRAVENOUS
  Filled 2013-11-15 (×6): qty 50

## 2013-11-15 MED ORDER — IPRATROPIUM-ALBUTEROL 0.5-2.5 (3) MG/3ML IN SOLN
3.0000 mL | RESPIRATORY_TRACT | Status: DC
  Filled 2013-11-15 (×2): qty 3

## 2013-11-15 MED ORDER — SODIUM CHLORIDE 0.9 % IV SOLN
INTRAVENOUS | Status: DC
  Administered 2013-11-15: 20:00:00 via INTRAVENOUS

## 2013-11-15 MED ORDER — VASOPRESSIN 20 UNIT/ML IJ SOLN
0.0300 [IU]/min | INTRAMUSCULAR | Status: DC
  Administered 2013-11-15 – 2013-11-18 (×4): 0.03 [IU]/min via INTRAVENOUS
  Filled 2013-11-15 (×5): qty 2

## 2013-11-15 MED ORDER — SODIUM BICARBONATE 8.4 % IV SOLN
INTRAVENOUS | Status: AC
  Administered 2013-11-15: 21:00:00
  Filled 2013-11-15: qty 50

## 2013-11-15 MED ORDER — PIPERACILLIN-TAZOBACTAM 3.375 G IVPB
3.3750 g | Freq: Three times a day (TID) | INTRAVENOUS | Status: DC
  Administered 2013-11-16 – 2013-11-19 (×10): 3.375 g via INTRAVENOUS
  Filled 2013-11-15 (×12): qty 50

## 2013-11-15 MED ORDER — VANCOMYCIN 50 MG/ML ORAL SOLUTION
125.0000 mg | Freq: Four times a day (QID) | ORAL | Status: DC
  Filled 2013-11-15: qty 2.5

## 2013-11-15 MED ORDER — DOPAMINE-DEXTROSE 3.2-5 MG/ML-% IV SOLN
INTRAVENOUS | Status: AC
  Administered 2013-11-15: 5 ug/kg/min
  Filled 2013-11-15: qty 250

## 2013-11-15 MED ORDER — VANCOMYCIN 50 MG/ML ORAL SOLUTION: 125.0000 mg | Freq: Every day | ORAL | Status: DC

## 2013-11-15 MED ORDER — METRONIDAZOLE IN NACL 5-0.79 MG/ML-% IV SOLN
500.0000 mg | Freq: Three times a day (TID) | INTRAVENOUS | Status: DC
  Administered 2013-11-15 – 2013-11-16 (×2): 500 mg via INTRAVENOUS
  Filled 2013-11-15 (×4): qty 100

## 2013-11-15 MED ORDER — ASPIRIN 81 MG PO CHEW
324.0000 mg | CHEWABLE_TABLET | ORAL | Status: AC
  Administered 2013-11-15: 324 mg via ORAL
  Filled 2013-11-15: qty 4

## 2013-11-15 NOTE — Procedures (Signed)
Arterial Catheter Insertion Procedure Note Fernando Lane [id number] 07/17/1950  Procedure: Insertion of Arterial Catheter  Indications: Blood pressure monitoring and Frequent blood sampling  Procedure Details Consent: Unable to obtain consent because of altered level of consciousness. Time Out: Verified patient identification, verified procedure, site/side was marked, verified correct patient position, special equipment/implants available, medications/allergies/relevent history reviewed, required imaging and test results available.  Performed  Maximum sterile technique was used including antiseptics, cap, gloves, gown, hand hygiene, mask and sheet. Skin prep: Chlorhexidine; local anesthetic administered 24 gauge catheter was inserted into right femoral artery using the Seldinger technique.  Evaluation Blood flow good; BP tracing good. Complications: No apparent complications.  Procedure performed under direct ultrasound guidance for real time vessel cannulation.      Montey Hora, [place] Pgr: [phone]  or [phone]  Chesley Mires, MD [place] 11/15/2013, 11:18 PM Pager:  [phone] After 3pm call: [phone]

## 2013-11-15 NOTE — Progress Notes (Signed)
CRITICAL VALUE ALERT  Critical value received:  Troponin .75  Date of notification:  11/15/2013  Time of notification:  2101  Critical value read back:Yes.    Nurse who received alert:  Corrinne Eagle RN relayed info to Dede Turner(pt's primary RN)  MD notified (1st page):  Dr. Nelda Marseille in [place]  Time of first page:  2102  MD notified (2nd page):  Time of second page:  Responding MD:  [email or web address]  Time MD responded:  2102

## 2013-11-15 NOTE — Progress Notes (Signed)
RT attempted Aline x2 with no success. RN made aware.

## 2013-11-15 NOTE — H&P (Addendum)
PULMONARY / CRITICAL CARE MEDICINE   Name: Fernando Lane MRN: [id number] DOB: 02-Oct-1950    ADMISSION DATE:  11/15/2013 CONSULTATION DATE:  11/15/2013  REFERRING MD : Oval Linsey EDP  CHIEF COMPLAINT:  SOB  INITIAL PRESENTATION:  63 yo male presented to [facility] ER with dyspnea and septic shock >> needed intubation.  Recently Dx with C diff, and has dry gangrene of Rt foot.  Transferred to [facility] for further tx.    STUDIES:  5/29 Echo > LVEF 35-40%, RV severe dilation, RA invagination. (EF 20% on LV gram 08/2013)  SIGNIFICANT EVENTS: 9/03 intubated for respiratory insufficiency and admitted to ICU  HISTORY OF PRESENT ILLNESS:  63 year old male with complex PMH as outlined below, which includes polycythemia vera, OSA on nocturnal CPAP, CHF, and CAD (2 vessel disease with medical management only). He resides at [facility] where he had developed SOB 9/3 am. He was transported to [facility] ED where his workup was concerning for sepsis vs CHF exacerbation. He was placed on BiPAP initially, but eventually required intubation. He received 4 liters of NS. He was then transferred to [facility] for ICU admission.   PAST MEDICAL HISTORY :  Past Medical History  Diagnosis Date  . Chronic low back pain   . 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 Chronic neck pain   . OSA (obstructive sleep apnea)     CPAP @ bedtime  . Allergy   . Unspecified essential hypertension     Resistant, 2D Echo - EF-55-60  . Polycythemia   . CAD (coronary artery disease)     Vessel type unspecified  . PVD (peripheral vascular disease)   . DDD (degenerative disc disease), cervical    Past Surgical History  Procedure Laterality Date  . Lower extrmity doppler      [place] 2008; no evidence for PAD  . US echocardiography  11/2008    Normal valves, mild LAE  . Left knee replacement  10/2006  . Cervical spine surgery  02/2008  . Joint replacement    . Spine surgery     Prior to Admission  medications   Medication Sig Start Date End Date Taking? Authorizing Provider  anagrelide (AGRYLIN) 0.5 MG capsule Take 1 capsule (0.5 mg total) by mouth 2 (two) times daily. 08/30/13   Domenic Polite, MD  aspirin 81 MG chewable tablet Chew 1 tablet (81 mg total) by mouth daily. 08/17/13   Geradine Girt, DO  atorvastatin (LIPITOR) 80 MG tablet Take 1 tablet (80 mg total) by mouth daily at 6 PM. 08/17/13   Geradine Girt, DO  carvedilol (COREG) 12.5 MG tablet Take 1 tablet (12.5 mg total) by mouth 2 (two) times daily with a meal. 08/17/13   Geradine Girt, DO  chlorhexidine (PERIDEX) 0.12 % solution Use as directed 15 mLs in the mouth or throat daily.    Historical Provider, MD  chlorpheniramine-HYDROcodone (TUSSIONEX) 10-8 MG/5ML LQCR Take 5 mLs by mouth every 12 (twelve) hours as needed for cough. 08/17/13   Geradine Girt, DO  ciprofloxacin (CIPRO) 250 MG tablet Take 1 tablet (250 mg total) by mouth 2 (two) times daily. For 3 days 08/30/13   Domenic Polite, MD  clopidogrel (PLAVIX) 75 MG tablet Take 75 mg by mouth daily at 6 PM.    Historical Provider, MD  colchicine 0.6 MG tablet Take 0.6 mg by mouth daily at 6 PM.    Historical Provider, MD  feeding supplement, ENSURE COMPLETE, (  ENSURE COMPLETE) LIQD Take 237 mLs by mouth 2 (two) times daily between meals. 08/17/13   Geradine Girt, DO  furosemide (LASIX) 20 MG tablet Take 1 tablet (20 mg total) by mouth 2 (two) times daily. 08/30/13   Domenic Polite, MD  gabapentin (NEURONTIN) 300 MG capsule Take 1 capsule (300 mg total) by mouth 3 (three) times daily. 02/21/13   Rowe Clack, MD  lisinopril (PRINIVIL,ZESTRIL) 5 MG tablet Take 1 tablet (5 mg total) by mouth daily. 08/17/13   Geradine Girt, DO  oxyCODONE-acetaminophen (PERCOCET/ROXICET) 5-325 MG per tablet Take 1 tablet by mouth every 6 (six) hours as needed for severe pain. 08/30/13   Domenic Polite, MD  thiamine 100 MG tablet Take 1 tablet (100 mg total) by mouth daily. 08/17/13   Geradine Girt, DO    No Known Allergies  FAMILY HISTORY:  Family History  Problem Relation Age of Onset  . Heart attack Father    SOCIAL HISTORY:  reports that he quit smoking about 45 years ago. His smoking use included Cigarettes and Pipe. He smoked 0.00 packs per day for 0 years. He has quit using smokeless tobacco. He reports that he drinks about 3.6 ounces of alcohol per week. He reports that he does not use illicit drugs.  REVIEW OF SYSTEMS:  Unable - intubated  SUBJECTIVE:   VITAL SIGNS: Temp:  [97.5 F (36.4 C)] 97.5 F (36.4 C) (09/03 1801) Pulse Rate:  [75-97] 75 (09/03 1955) Resp:  [20] 20 (09/03 1955) BP: (75-105)/(43-77) 89/57 mmHg (09/03 1955) SpO2:  [89 %-96 %] 89 % (09/03 1900) FiO2 (%):  [100 %] 100 % (09/03 1955) Weight:  [246 lb 7.6 oz (111.8 kg)] 246 lb 7.6 oz (111.8 kg) (09/03 1801) HEMODYNAMICS:   VENTILATOR SETTINGS: Vent Mode:  [-] PRVC FiO2 (%):  [100 %] 100 % Set Rate:  [20 bmp] 20 bmp Vt Set:  [450 mL] 450 mL PEEP:  [5 cmH20] 5 cmH20 Plateau Pressure:  [15 UVO53-66 cmH20] 15 cmH20 INTAKE / OUTPUT: No intake or output data in the 24 hours ending 11/15/13 2252  PHYSICAL EXAMINATION: General:  Obese male sedated on vent Neuro:  Comatose on vent with no sedation HEENT:  [place]/AT, no JVD noted (difficult due to neck girth) Cardiovascular:  Tachy, regular, several runs of NSVT 10-15 beats.  Lungs:  Coarse crackles bilaterally Abdomen:  Soft, non-distended Musculoskeletal: No acute deformities  Skin: Cyanosis to RLE, mottled extremities. Excoriation to buttocks  LABS:  CBC  Recent Labs Lab 11/15/13 1950  WBC 47.1*  HGB 15.6  HCT 48.8  PLT 829*   Coag's  Recent Labs Lab 11/15/13 1950  INR 1.59*   BMET  Recent Labs Lab 11/15/13 1950  NA 137  K 4.0  CL 99  CO2 18*  BUN 51*  CREATININE 3.42*  GLUCOSE 229*   Electrolytes  Recent Labs Lab 11/15/13 1950  CALCIUM 7.5*   Sepsis Markers  Recent Labs Lab 11/15/13 1921 11/15/13 1951   LATICACIDVEN 2.4*  --   PROCALCITON  --  0.26   ABG  Recent Labs Lab 11/15/13 2033  PHART 7.200*  PCO2ART 47.4*  PO2ART 97.0   Liver Enzymes  Recent Labs Lab 11/15/13 1950  AST 19  ALT 14  ALKPHOS 118*  BILITOT 1.0  ALBUMIN 2.4*   Cardiac Enzymes  Recent Labs Lab 11/15/13 1951  TROPONINI 0.75*  PROBNP 9486.0*   Glucose  Recent Labs Lab 11/15/13 1826  GLUCAP 188*    Imaging No  results found.   ASSESSMENT / PLAN:  PULMONARY OETT 9/3 >> A: Acute in setting of septic shock and possible PNA. Hx of OSA on home CPAP. Hx of COPD. P:   Full vent support F/u CXR, ABG BD's Daily SBT when meets criteria  CARDIOVASCULAR Rt IJ CVL (Placed at [facility]) 9/3 >>  A:  Septic shock. Chronic systolic heart failure. Peripheral vascular disease with dry gangrene Rt foot >> was recommend amputation by [facility]VS at visit 10/11/13. Hx of CAD with STEMI 08/07/13 >> severe 2 vessel CAD of LAD and mid RCA on cath 08/08/13 >> medical management. Hx of hyperlipidemia. Hx of pericardial effusion after MI s/p pericardiocentesis 08/10/13 >> was on colchicine as outpt. P:  Pressors for MAP goal > 65 mmHg Fluids for goal CVP > 8 F/u ECHO Trend Troponin Trend lactic acid Hold outpt lipitor, coreg, plavix, lasix, lisinopril Hold colchicine  RENAL A:   AKI in setting of septic shock >> baseline creatinine 0.75 from 08/29/13. Anion gap metabolic acidosis. P:   Monitor renal fx, urine outpt, electrolytes Continue HCO3 in IV fluid F/u ABG  GASTROINTESTINAL A:   Nutrition. P:   NPO SUP: IV Pepcid in setting of recent C diff Tube feeds if unable to extubate soon  HEMATOLOGIC A:   Hx of Polycythemia vera. P:  Follow CBC SQ heparin for DVT prophylaxis Hold outpt anagrelide  INFECTIOUS A:  Septic Shock >> potential sources PNA, C diff, gangrene Rt foot.  C-diff positive from stool PCR 11/12/13 (from outside hospital) >> Tx with Fidaxomicin 8/31 >>>9/3. P:   BCx2 9/3  >> UC 9/3 >>> Sputum 9/3 >>>  Day 1 zosyn, vancomycin (IV and enteral), flagyl >> start date 9/03  ENDOCRINE A:   Hyperglycemia. Relative adrenal insufficiency in setting of septic shock. P:   SSI F/u TSH Stress dose steroids pending cortisol results  NEUROLOGIC A:   Acute metabolic encephalopathy. P:   RASS goal: -1 PRN fentanyl for sedation Monitor  Georgann Housekeeper, ACNP [place] [facility]/Critical [facility] Pager [phone] or [phone]   TODAY'S SUMMARY:  63 yo male from [place] with septic shock, VDRF, AKI, encephalopathy.  Potential causes of sepsis are PNA, C diff, and dry gangrene.  Continue Abx, vent support, pressor support.  Might need renal to assess for CRRT if no improvement in urine outpt.  Will likely need assessment by surgery for Rt leg >> defer until pt can be more stabilized.  CC time 60 minutes.  Chesley Mires, MD [facility] Pulmonary/Critical Care 11/15/2013, 11:06 PM Pager:  [phone] After 3pm call: [phone]

## 2013-11-15 NOTE — Progress Notes (Addendum)
ANTIBIOTIC CONSULT NOTE - INITIAL  Pharmacy Consult:  Vancomycin / Zosyn + Flagyl / PO Vanc Indication:  Sepsis / Recent C.diff (11/12/13)   No Known Allergies  Patient Measurements: Height: 5\' 2"  (157.5 cm) Weight: 246 lb 7.6 oz (111.8 kg) IBW/kg (Calculated) : 54.6  Vital Signs: Temp: 97.5 F (36.4 C) (09/03 1801) Temp src: Oral (09/03 1801) BP: 91/61 mmHg (09/03 1900) Pulse Rate: 79 (09/03 1900)  Labs: No results found for this basename: WBC, HGB, PLT, LABCREA, CREATININE,  in the last 72 hours Estimated Creatinine Clearance: 102.3 ml/min (by C-G formula based on Cr of 0.8). No results found for this basename: VANCOTROUGH, VANCOPEAK, VANCORANDOM, GENTTROUGH, GENTPEAK, GENTRANDOM, TOBRATROUGH, TOBRAPEAK, TOBRARND, AMIKACINPEAK, AMIKACINTROU, AMIKACIN,  in the last 72 hours   Microbiology: No results found for this or any previous visit (from the past 720 hour(s)).  Medical History: Past Medical History  Diagnosis Date  . Chronic low back pain   . 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 Chronic neck pain   . OSA (obstructive sleep apnea)     CPAP @ bedtime  . Allergy   . Unspecified essential hypertension     Resistant, 2D Echo - EF-55-60  . Polycythemia   . CAD (coronary artery disease)     Vessel type unspecified  . PVD (peripheral vascular disease)   . DDD (degenerative disc disease), cervical      Assessment: 22 YOM transferred from [facility] ED with severe respiratory distress and sepsis.  Pharmacy consulted to manage vancomycin and Zosyn for sepsis and Flagyl for recent C.diff infection.  Labs from [place] reviewed.  Spoke to PharmD at [facility], patient has only received vancomycin 2gm IV x 1 at 1445.  Vanc 9/3 >> Zosyn 9/3 >> Flagyl 9/3 >> (stop 9/17) PO Vanc 9/3 >> (stop 9/17) Fidaxomicin 8/31 >> 9/3   Goal of Therapy:  Vancomycin trough level 15-20 mcg/ml   Plan:   - Vanc 1500mg  IV Q48H, start  11/17/13 - Zosyn 3.375gm IV Q8H, 4 hr infusion - Flagyl 500mg  IV Q8H + PO Vanc 500mg  Q6H x14 days total - Monitor renal fxn, clinical course, vanc trough as indicated    Shebra Muldrow D. Mina Marble, PharmD, BCPS Pager:  [phone] 11/15/2013, 8:06 PM

## 2013-11-16 ENCOUNTER — Inpatient Hospital Stay (HOSPITAL_COMMUNITY): Payer: Medicare Other

## 2013-11-16 DIAGNOSIS — I70269 Atherosclerosis of native arteries of extremities with gangrene, unspecified extremity: Secondary | ICD-10-CM

## 2013-11-16 DIAGNOSIS — I369 Nonrheumatic tricuspid valve disorder, unspecified: Secondary | ICD-10-CM

## 2013-11-16 LAB — BASIC METABOLIC PANEL
ANION GAP: 20 — AB (ref 5–15)
BUN: 55 mg/dL — AB (ref 6–23)
CO2: 18 mEq/L — ABNORMAL LOW (ref 19–32)
Calcium: 7.6 mg/dL — ABNORMAL LOW (ref 8.4–10.5)
Chloride: 99 mEq/L (ref 96–112)
Creatinine, Ser: 3.13 mg/dL — ABNORMAL HIGH (ref 0.50–1.35)
GFR calc Af Amer: 23 mL/min — ABNORMAL LOW (ref >90)
GFR, EST NON AFRICAN AMERICAN: 20 mL/min — AB (ref >90)
Glucose, Bld: 182 mg/dL — ABNORMAL HIGH (ref 70–99)
POTASSIUM: 3.8 meq/L (ref 3.7–5.3)
SODIUM: 137 meq/L (ref 137–147)

## 2013-11-16 LAB — BLOOD GAS, ARTERIAL
Acid-base deficit: 5.8 mmol/L — ABNORMAL HIGH (ref 0.0–2.0)
Bicarbonate: 18.5 mEq/L — ABNORMAL LOW (ref 20.0–24.0)
FIO2: 80 %
O2 SAT: 98 %
PATIENT TEMPERATURE: 98.6
PEEP/CPAP: 5 cmH2O
RATE: 20 resp/min
TCO2: 19.5 mmol/L (ref 0–100)
VT: 450 mL
pCO2 arterial: 32.7 mmHg — ABNORMAL LOW (ref 35.0–45.0)
pH, Arterial: 7.371 (ref 7.350–7.450)
pO2, Arterial: 115 mmHg — ABNORMAL HIGH (ref 80.0–100.0)

## 2013-11-16 LAB — GLUCOSE, CAPILLARY
GLUCOSE-CAPILLARY: 131 mg/dL — AB (ref 70–99)
GLUCOSE-CAPILLARY: 112 mg/dL — AB (ref 70–99)
GLUCOSE-CAPILLARY: 138 mg/dL — AB (ref 70–99)
GLUCOSE-CAPILLARY: 125 mg/dL — AB (ref 70–99)
Glucose-Capillary: 147 mg/dL — ABNORMAL HIGH (ref 70–99)
Glucose-Capillary: 151 mg/dL — ABNORMAL HIGH (ref 70–99)

## 2013-11-16 LAB — CBC
HEMATOCRIT: 48.3 % (ref 39.0–52.0)
Hemoglobin: 15.9 g/dL (ref 13.0–17.0)
MCH: 28.6 pg (ref 26.0–34.0)
MCHC: 32.9 g/dL (ref 30.0–36.0)
MCV: 87 fL (ref 78.0–100.0)
PLATELETS: 763 10*3/uL — AB (ref 150–400)
RBC: 5.55 MIL/uL (ref 4.22–5.81)
RDW: 17.9 % — AB (ref 11.5–15.5)
WBC: 44 10*3/uL — ABNORMAL HIGH (ref 4.0–10.5)

## 2013-11-16 LAB — TYPE AND SCREEN
ABO/RH(D): A POS
Antibody Screen: NEGATIVE

## 2013-11-16 LAB — CARBOXYHEMOGLOBIN
Carboxyhemoglobin: 0.8 % (ref 0.5–1.5)
METHEMOGLOBIN: 0.8 % (ref 0.0–1.5)
O2 SAT: 60.1 %
TOTAL HEMOGLOBIN: 15.7 g/dL (ref 13.5–18.0)

## 2013-11-16 LAB — CORTISOL: CORTISOL PLASMA: 53.8 ug/dL

## 2013-11-16 LAB — TSH: TSH: 2.57 u[IU]/mL (ref 0.350–4.500)

## 2013-11-16 LAB — TROPONIN I
TROPONIN I: 1.98 ng/mL — AB (ref <0.30)
Troponin I: 2.63 ng/mL (ref <0.30)
Troponin I: 2.07 ng/mL (ref <0.30)

## 2013-11-16 LAB — HEPATIC FUNCTION PANEL
ALBUMIN: 2.5 g/dL — AB (ref 3.5–5.2)
ALT: 13 U/L (ref 0–53)
AST: 20 U/L (ref 0–37)
Alkaline Phosphatase: 113 U/L (ref 39–117)
Bilirubin, Direct: 0.5 mg/dL — ABNORMAL HIGH (ref 0.0–0.3)
Indirect Bilirubin: 0.4 mg/dL (ref 0.3–0.9)
TOTAL PROTEIN: 5.3 g/dL — AB (ref 6.0–8.3)
Total Bilirubin: 0.9 mg/dL (ref 0.3–1.2)

## 2013-11-16 MED ORDER — ANAGRELIDE HCL 0.5 MG PO CAPS
0.5000 mg | ORAL_CAPSULE | Freq: Two times a day (BID) | ORAL | Status: DC
  Administered 2013-11-16 – 2013-11-20 (×9): 0.5 mg via ORAL
  Filled 2013-11-16 (×10): qty 1

## 2013-11-16 MED ORDER — MUPIROCIN 2 % EX OINT
1.0000 "application " | TOPICAL_OINTMENT | Freq: Two times a day (BID) | CUTANEOUS | Status: AC
  Administered 2013-11-16 – 2013-11-20 (×10): 1 via NASAL
  Filled 2013-11-16: qty 22

## 2013-11-16 MED ORDER — CLOPIDOGREL BISULFATE 75 MG PO TABS
75.0000 mg | ORAL_TABLET | Freq: Every day | ORAL | Status: DC
Start: 2013-11-16 — End: 2013-11-16
  Filled 2013-11-16: qty 1

## 2013-11-16 MED ORDER — ASPIRIN 325 MG PO TABS
325.0000 mg | ORAL_TABLET | Freq: Every day | ORAL | Status: DC
  Administered 2013-11-16 – 2013-12-04 (×19): 325 mg via ORAL
  Filled 2013-11-16 (×19): qty 1

## 2013-11-16 MED ORDER — CHLORHEXIDINE GLUCONATE 0.12 % MT SOLN
15.0000 mL | Freq: Two times a day (BID) | OROMUCOSAL | Status: DC
  Administered 2013-11-16 – 2013-11-17 (×3): 15 mL via OROMUCOSAL
  Filled 2013-11-16 (×3): qty 15

## 2013-11-16 MED ORDER — PERFLUTREN LIPID MICROSPHERE
1.0000 mL | INTRAVENOUS | Status: AC | PRN
  Administered 2013-11-16: 2 mL via INTRAVENOUS
  Filled 2013-11-16: qty 10

## 2013-11-16 MED ORDER — FENTANYL CITRATE 0.05 MG/ML IJ SOLN
50.0000 ug | INTRAMUSCULAR | Status: DC | PRN
  Administered 2013-11-16 – 2013-11-17 (×5): 100 ug via INTRAVENOUS
  Administered 2013-11-18 – 2013-11-19 (×5): 50 ug via INTRAVENOUS
  Filled 2013-11-16 (×11): qty 2

## 2013-11-16 MED ORDER — ALBUTEROL SULFATE (2.5 MG/3ML) 0.083% IN NEBU: 2.5000 mg | INHALATION_SOLUTION | RESPIRATORY_TRACT | Status: DC | PRN

## 2013-11-16 MED ORDER — FIDAXOMICIN 200 MG PO TABS
200.0000 mg | ORAL_TABLET | Freq: Two times a day (BID) | ORAL | Status: DC
  Administered 2013-11-16 – 2013-11-21 (×11): 200 mg via ORAL
  Filled 2013-11-16 (×12): qty 1

## 2013-11-16 MED ORDER — CHLORHEXIDINE GLUCONATE CLOTH 2 % EX PADS
6.0000 | MEDICATED_PAD | Freq: Every day | CUTANEOUS | Status: AC
Start: 2013-11-16 — End: 2013-11-21
  Administered 2013-11-16 – 2013-11-20 (×4): 6 via TOPICAL

## 2013-11-16 MED ORDER — CETYLPYRIDINIUM CHLORIDE 0.05 % MT LIQD
7.0000 mL | Freq: Four times a day (QID) | OROMUCOSAL | Status: DC
  Administered 2013-11-16 (×2): 7 mL via OROMUCOSAL

## 2013-11-16 MED ORDER — ATORVASTATIN CALCIUM 80 MG PO TABS
80.0000 mg | ORAL_TABLET | Freq: Every day | ORAL | Status: DC
  Administered 2013-11-16 – 2013-12-03 (×18): 80 mg via ORAL
  Filled 2013-11-16 (×20): qty 1

## 2013-11-16 MED ORDER — CETYLPYRIDINIUM CHLORIDE 0.05 % MT LIQD
7.0000 mL | Freq: Two times a day (BID) | OROMUCOSAL | Status: DC
  Administered 2013-11-16 – 2013-11-17 (×2): 7 mL via OROMUCOSAL

## 2013-11-16 NOTE — Procedures (Signed)
Extubation Procedure Note  Patient Details:   Name: Fernando Lane DOB: 09/03/1950 MRN: [id number]   Airway Documentation:     Evaluation  O2 sats: stable throughout Complications: No apparent complications Patient did tolerate procedure well. Bilateral Breath Sounds: Clear;Diminished Suctioning: Airway Yes  Pt was extubated to 3 LPM nasal cannula. Pt had good strong productive cough. Pt was able to vocalize name. Pt had good air movement in neck and clear; diminished breath sounds. No stridor noted. RT will continue to monitor pt.   Coby Antrobus M 11/16/2013, 10:47 AM

## 2013-11-16 NOTE — Progress Notes (Signed)
ANTIBIOTIC CONSULT NOTE - Follow Up  Pharmacy Consult:  Fidaxomicin  Indication:  Recent C.diff (11/12/13)   No Known Allergies  Patient Measurements: Height: 5\' 2"  (157.5 cm) Weight: 248 lb 10.9 oz (112.8 kg) IBW/kg (Calculated) : 54.6  Vital Signs: Temp: 97.6 F (36.4 C) (09/04 0804) Temp src: Oral (09/04 0804) BP: 116/78 mmHg (09/04 0833) Pulse Rate: 86 (09/04 0833)  Labs:  Recent Labs  11/15/13 1950 11/16/13 0500  WBC 47.1* 44.0*  HGB 15.6 15.9  PLT 829* 763*  CREATININE 3.42* 3.13*   Estimated Creatinine Clearance: 26.3 ml/min (by C-G formula based on Cr of 3.13). No results found for this basename: VANCOTROUGH, [place] Leak, VANCORANDOM, [place], GENTPEAK, GENTRANDOM, TOBRATROUGH, TOBRAPEAK, TOBRARND, AMIKACINPEAK, AMIKACINTROU, AMIKACIN,  in the last 72 hours   Microbiology: Recent Results (from the past 720 hour(s))  MRSA PCR SCREENING     Status: Abnormal   Collection Time    11/15/13  6:02 PM      Result Value Ref Range Status   MRSA by PCR POSITIVE (*) NEGATIVE Final   Comment:            The GeneXpert MRSA Assay (FDA     approved for NASAL specimens     only), is one component of a     comprehensive MRSA colonization     surveillance program. It is not     intended to diagnose MRSA     infection nor to guide or     monitor treatment for     MRSA infections.     RESULT CALLED TO, READ BACK BY AND VERIFIED WITH:     D.TURNER AT 2106 11/15/13    Medical History: Past Medical History  Diagnosis Date  . Chronic low back pain   . 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 Chronic neck pain   . OSA (obstructive sleep apnea)     CPAP @ bedtime  . Allergy   . Unspecified essential hypertension     Resistant, 2D Echo - EF-55-60  . Polycythemia   . CAD (coronary artery disease)     Vessel type unspecified  . PVD (peripheral vascular disease)   . DDD (degenerative disc disease), cervical     Assessment: 65 YOM transferred from [facility] ED with severe respiratory distress and sepsis.  Pharmacy consulted to manage vancomycin and Zosyn for sepsis and Flagyl for recent C.diff infection.  Today his WBC has shot up to 44.0.  We have been asked to resume his Fidaxomicin for possible ongoing C.Diff infection.  Vanc 9/3 >> Zosyn 9/3 >> Flagyl 9/3 >> 9/4 PO Vanc 9/3 >> 9/4 Fidaxomicin 8/31 >> 9/3 - Restart 9/4>>(through 9/14)  Goal of Therapy:  Vancomycin trough level 15-20 mcg/ml  Plan:   - Vanc 1500mg  IV Q48H, start 11/17/13 - Zosyn 3.375gm IV Q8H, 4 hr infusion - Fidaxomicin 200mg  bid x 10 day course - Monitor renal fxn, clinical course, vanc trough as indicated  Rober Minion, PharmD., MS Clinical Pharmacist Pager:  [phone] Thank you for allowing pharmacy to be part of this patients care team. 11/16/2013, 10:16 AM

## 2013-11-16 NOTE — Consult Note (Signed)
Hospital Consult    Reason for Consult:  Gangrene right foot MRN #:  [id number]  History of Present Illness: This is a 63 y.o. male who is known to Dr. Oneida Alar for gangrenous changes to the toes on the right foot.  He underwent arteriogram on Aug 08, 2013, which revealed unreconstructable tibial artery occlusive disease.  At that time, he did have pain in his foot, but he was tolerating it.  He is minimally ambulatory.  On October 11, 2013, he was offered a right BKA versus observation.  Dr. Oneida Alar discussed with the pt that if he had evidence of ascending worsening infection or unrelenting pain from the right foot, he would need an amputation.    Pt has hx of polycythemia, sleep apnea, CAD and pericarditis.  His ejection fraction was 20%.  He did have an echo today, but these results are not recorded yet.  He presented to [facility] yesterday with septic shock and acute respiratory failure requiring intubation and transferred to [facility] for further management.  He has hx of STEMI with medical management  He did have a positive troponin I this am of 2.63, which is elevated from last pm.  His BNP is 9400.  He does have AKI with the creatinine up significantly to 3.13 today and it was 0.80 in June of this year.  His WBC is 44k and he is on vanc/Zosyn and Metronidazole for + C diff.  Urine and Blood cx are pending.  He has been afebrile since admission.  The pt is on Plavix and aspirin.    Past Medical History  Diagnosis Date  . Chronic low back pain   . 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 Chronic neck pain   . OSA (obstructive sleep apnea)     CPAP @ bedtime  . Allergy   . Unspecified essential hypertension     Resistant, 2D Echo - EF-55-60  . Polycythemia   . CAD (coronary artery disease)     Vessel type unspecified  . PVD (peripheral vascular disease)   . DDD (degenerative disc disease), cervical    Past Surgical History    Procedure Laterality Date  . Lower extrmity doppler      [place] 2008; no evidence for PAD  . US echocardiography  11/2008    Normal valves, mild LAE  . Left knee replacement  10/2006  . Cervical spine surgery  02/2008  . Joint replacement    . Spine surgery      No Known Allergies  Prior to Admission medications   Medication Sig Start Date End Date Taking? Authorizing Provider  anagrelide (AGRYLIN) 0.5 MG capsule Take 1 capsule (0.5 mg total) by mouth 2 (two) times daily. 08/30/13   Domenic Polite, MD  aspirin 81 MG chewable tablet Chew 1 tablet (81 mg total) by mouth daily. 08/17/13   Geradine Girt, DO  atorvastatin (LIPITOR) 80 MG tablet Take 1 tablet (80 mg total) by mouth daily at 6 PM. 08/17/13   Geradine Girt, DO  carvedilol (COREG) 12.5 MG tablet Take 1 tablet (12.5 mg total) by mouth 2 (two) times daily with a meal. 08/17/13   Geradine Girt, DO  chlorhexidine (PERIDEX) 0.12 % solution Use as directed 15 mLs in the mouth or throat daily.    Historical Provider, MD  chlorpheniramine-HYDROcodone (TUSSIONEX) 10-8 MG/5ML LQCR Take 5 mLs by mouth every 12 (twelve) hours as needed for cough. 08/17/13   Janett Billow  U Vann, DO  ciprofloxacin (CIPRO) 250 MG tablet Take 1 tablet (250 mg total) by mouth 2 (two) times daily. For 3 days 08/30/13   Domenic Polite, MD  clopidogrel (PLAVIX) 75 MG tablet Take 75 mg by mouth daily at 6 PM.    Historical Provider, MD  colchicine 0.6 MG tablet Take 0.6 mg by mouth daily at 6 PM.    Historical Provider, MD  feeding supplement, ENSURE COMPLETE, (ENSURE COMPLETE) LIQD Take 237 mLs by mouth 2 (two) times daily between meals. 08/17/13   Geradine Girt, DO  furosemide (LASIX) 20 MG tablet Take 1 tablet (20 mg total) by mouth 2 (two) times daily. 08/30/13   Domenic Polite, MD  gabapentin (NEURONTIN) 300 MG capsule Take 1 capsule (300 mg total) by mouth 3 (three) times daily. 02/21/13   Rowe Clack, MD  lisinopril (PRINIVIL,ZESTRIL) 5 MG tablet Take 1 tablet (5 mg total)  by mouth daily. 08/17/13   Geradine Girt, DO  oxyCODONE-acetaminophen (PERCOCET/ROXICET) 5-325 MG per tablet Take 1 tablet by mouth every 6 (six) hours as needed for severe pain. 08/30/13   Domenic Polite, MD  thiamine 100 MG tablet Take 1 tablet (100 mg total) by mouth daily. 08/17/13   Geradine Girt, DO    History   Social History  . Marital Status: Divorced    Spouse Name: N/A    Number of Children: 1  . Years of Education: N/A   Occupational History  .     Social History Main Topics  . Smoking status: Former Smoker -- 0 years    Types: Cigarettes, Pipe    Quit date: 03/15/1968  . Smokeless tobacco: Former Systems developer     Comment: Pt quit Cigarettes 1970's- and cigars & chew 1990  . Alcohol Use: 3.6 oz/week    6 Cans of beer per week  . Drug Use: No  . Sexual Activity: Not on file   Other Topics Concern  . Not on file   Social History Narrative   Divorced, lives alone. Daughter lives in town. Disable/Former management/construction     Family History  Problem Relation Age of Onset  . Heart attack Father     ROS: [x]  Positive   [ ]  Negative   [ ]  All sytems reviewed and are negative  Cardiovascular: []  chest pain/pressure []  palpitations []  SOB lying flat []  DOE []  pain in legs while walking []  pain in legs at rest []  pain in legs at night []  non-healing ulcers []  hx of DVT []  swelling in legs  Pulmonary: []  productive cough []  asthma/wheezing []  home O2 [x]  OSA-uses CPAP  Neurologic: []  weakness in []  arms []  legs []  numbness in []  arms []  legs []  hx of CVA []  mini stroke [] difficulty speaking or slurred speech []  temporary loss of vision in one eye []  dizziness  Hematologic: []  hx of cancer []  bleeding problems []  problems with blood clotting easily [x]  polycythemia  Endocrine:   []  diabetes []  thyroid disease  GI []  vomiting blood []  blood in stool  GU: []  CKD/renal failure []  HD--[]  M/W/F or []  T/T/S [x]  AKI []  burning with urination []   blood in urine  Psychiatric: []  anxiety []  depression  Musculoskeletal: []  arthritis []  joint pain [x]  DDD-cervical  Integumentary: []  rashes [x]  gangrene right foot  Constitutional: []  fever []  chills   Physical Examination  Filed Vitals:   11/16/13 1224  BP:   Pulse:   Temp: 97.8 F (36.6 C)  Resp:  Body mass index is 40.16 kg/(m^2).  General:  WDWN in NAD Gait: Not observed HENT: WNL, normocephalic Eyes: Pupils equal Pulmonary: normal non-labored breathing, without Rales, rhonchi,  wheezing Cardiac: regular, without  Murmurs, rubs or gallops; without carotid bruit on the left (he has a triple lumen catheter right neck) Abdomen: soft, NT/ND, no masses Skin: without rashes, with ulcers  Vascular Exam/Pulses:  Right Left  Radial 2+ (normal) 2+ (normal)  Ulnar Unable to palpate Unable to palpate  Femoral Pt has catheter in right groin therefore, unable to palpate Unable to palpate  Popliteal Unable to palpate Unable to palpate  DP Unable to palpate Unable to palpate  PT Unable to palpate Unable to palpate   Extremities: with ischemic changes to right foot, he does have a wound on the lateral aspect of right leg above the ankle;  Gangrenous toes right foot with Gangrene , without cellulitis; with open wounds; left foot and leg to mid calf has mild mottling Musculoskeletal: no muscle wasting or atrophy  Neurologic: A&O X 3; Appropriate Affect ; SENSATION: normal; MOTOR FUNCTION:  moving all extremities equally. Speech is fluent/normal Psychiatric:  Appropriate to make decisions Lymph:  No inguinal lymphadenopathy left groin   CBC    Component Value Date/Time   WBC 44.0* 11/16/2013 0500   WBC 12.9* 04/04/2013 1125   RBC 5.55 11/16/2013 0500   RBC 5.23 04/04/2013 1125   HGB 15.9 11/16/2013 0500   HGB 15.1 04/04/2013 1125   HCT 48.3 11/16/2013 0500   HCT 46.9 04/04/2013 1125   PLT 763* 11/16/2013 0500   PLT 459* 04/04/2013 1125   MCV 87.0 11/16/2013 0500   MCV 89.7  04/04/2013 1125   MCH 28.6 11/16/2013 0500   MCH 28.9 04/04/2013 1125   MCHC 32.9 11/16/2013 0500   MCHC 32.2 04/04/2013 1125   RDW 17.9* 11/16/2013 0500   RDW 16.1* 04/04/2013 1125   LYMPHSABS 0.9 11/15/2013 1950   LYMPHSABS 1.5 04/04/2013 1125   MONOABS 0.9 11/15/2013 1950   MONOABS 0.2 04/04/2013 1125   EOSABS 0.0 11/15/2013 1950   EOSABS 0.8* 04/04/2013 1125   BASOSABS 0.0 11/15/2013 1950   BASOSABS 0.0 04/04/2013 1125    BMET    Component Value Date/Time   NA 137 11/16/2013 0500   NA 136 03/02/2013 1055   K 3.8 11/16/2013 0500   K 4.2 03/02/2013 1055   CL 99 11/16/2013 0500   CL 108* 08/04/2012 1302   CO2 18* 11/16/2013 0500   CO2 22 03/02/2013 1055   GLUCOSE 182* 11/16/2013 0500   GLUCOSE 111 03/02/2013 1055   GLUCOSE 164* 08/04/2012 1302   BUN 55* 11/16/2013 0500   BUN 13.8 03/02/2013 1055   CREATININE 3.13* 11/16/2013 0500   CREATININE 0.7 03/02/2013 1055   CALCIUM 7.6* 11/16/2013 0500   CALCIUM 9.7 03/02/2013 1055   GFRNONAA 20* 11/16/2013 0500   GFRAA 23* 11/16/2013 0500    COAGS: Lab Results  Component Value Date   INR 1.59* 11/15/2013   INR 1.24 08/23/2013   INR 1.55* 08/08/2013     Non-Invasive Vascular Imaging:  None this admission  Statin:  Yes.   Beta Blocker:  Yes.   Aspirin:  Yes.   ACEI:  Yes.   ARB:  No. Other antiplatelets/anticoagulants:  Yes.  -Plavix   ASSESSMENT/PLAN: This is a 63 y.o. 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   -foot with gangrene, but doubt this is the source of his sepsis.  He is requiring pressor support.  Will allow pressor to be weaned off and proceed in a few days with right AKA vs. BKA.  I have also discontinued his Plavix today in anticipation for surgery.    -he does have mottling of the left foot/leg as well.  He does have severe tibial disease in the left leg as well.  Most likely being caused by pressors.  Hopeful this will improve once pressors are weaned. - pt is + for C diff and is on  Dificid -pt is septic with leukocytosis with WBC of 44k--pt is on Vanc/Zosyn--continue -pt with elevated troponin as well--will let pt recover more before proceeding to OR -continue care per primary team -will follow with you.   Leontine Locket, PA-C [facility] [phone]  History and exam as above.  Pt with prior arteriogram a few months ago which showed unreconstructable tibial disease.  He does have wound on the lateral aspect of his right foot and dusky toes.  I doubt this is the source of his current sepsis but his flow to his feet is compromised and probably worse secondary to vasopressin/levophed.  Pt is consenting to amputation of right leg at this point.  Will follow for now as his sepsis issues will need to be resolved prior to considering this.  Will follow as consult.  Ruta Hinds, MD [facility] [place] Office: [phone] Pager: [phone]

## 2013-11-16 NOTE — Progress Notes (Signed)
INITIAL NUTRITION ASSESSMENT  DOCUMENTATION CODES Per approved criteria  -Morbid Obesity   INTERVENTION:  Diet advancement per MD.  If intake is inadequate, can add Glucerna Shake po TID to maximize oral intake, each supplement provides 220 kcal and 10 grams of protein  NUTRITION DIAGNOSIS: Inadequate oral intake related to inability to eat as evidenced by NPO status.   Goal: Intake to meet >90% of estimated nutrition needs.  Monitor:  Diet advancement, PO intake, labs, weight trend.   Reason for Assessment: MD Consult for TF initiation and management.  63 y.o. male  Admitting Dx: SOB  ASSESSMENT: 63 yo male presented to [facility] ER with dyspnea and septic shock >> needed intubation. Recently Dx with C diff, and has dry gangrene of Rt foot. Transferred to [facility] for further tx. Work-up at [facility] ED was concerning for sepsis vs CHF exacerbation.   Discussed patient in ICU rounds today. Patient was extubated this morning. No plans to start TF at this time. Hopeful to advance diet today. Nutrition focused physical exam completed.  No muscle or subcutaneous fat depletion noticed.  Height: Ht Readings from Last 1 Encounters:  11/16/13 5\' 6"  (1.676 m)    Weight: Wt Readings from Last 1 Encounters:  11/16/13 248 lb 10.9 oz (112.8 kg)    Ideal Body Weight: 64.5 kg  % Ideal Body Weight: 175%  Wt Readings from Last 10 Encounters:  11/16/13 248 lb 10.9 oz (112.8 kg)  10/11/13 233 lb (105.688 kg)  08/30/13 212 lb 4.9 oz (96.3 kg)  08/17/13 214 lb 8.1 oz (97.3 kg)  08/17/13 214 lb 8.1 oz (97.3 kg)  08/17/13 214 lb 8.1 oz (97.3 kg)  03/02/13 238 lb 1.6 oz (108.001 kg)  01/24/13 230 lb (104.327 kg)  01/24/13 230 lb (104.327 kg)  12/20/12 233 lb (105.688 kg)    Usual Body Weight: ~233 lb one year ago, 214 lb 3 months ago  % Usual Body Weight: 106%  BMI:  Body mass index is 40.16 kg/(m^2). class 3, extreme/morbid obesity  Estimated Nutritional Needs: Kcal:  2000-2200 Protein: 120-130 gm Fluid: 2-2.2 L  Skin: pressure ulcer (DTI) to sacrum; diabetic ulcer to right leg; black toes on right foot  Diet Order: NPO  EDUCATION NEEDS: -Education not appropriate at this time   Intake/Output Summary (Last 24 hours) at 11/16/13 1237 Last data filed at 11/16/13 1100  Gross per 24 hour  Intake 2171.37 ml  Output    480 ml  Net 1691.37 ml    Last BM: PTA   Labs:   Recent Labs Lab 11/15/13 1950 11/16/13 0500  NA 137 137  K 4.0 3.8  CL 99 99  CO2 18* 18*  BUN 51* 55*  CREATININE 3.42* 3.13*  CALCIUM 7.5* 7.6*  GLUCOSE 229* 182*    CBG (last 3)   Recent Labs  11/16/13 0010 11/16/13 0428 11/16/13 0801  GLUCAP 147* 151* 131*    Scheduled Meds: . anagrelide  0.5 mg Oral BID  . antiseptic oral rinse  7 mL Mouth Rinse QID  . aspirin  325 mg Oral Daily  . atorvastatin  80 mg Oral q1800  . chlorhexidine  15 mL Mouth Rinse BID  . Chlorhexidine Gluconate Cloth  6 each Topical Q0600  . clopidogrel  75 mg Oral q1800  . famotidine (PEPCID) IV  20 mg Intravenous QHS  . fidaxomicin  200 mg Oral BID  . heparin  5,000 Units Subcutaneous 3 times per day  . insulin aspart  2-6 Units  Subcutaneous 6 times per day  . mupirocin ointment  1 application Nasal BID  . piperacillin-tazobactam (ZOSYN)  IV  3.375 g Intravenous Q8H  . [START ON 11/17/2013] vancomycin  1,500 mg Intravenous Q48H    Continuous Infusions: . norepinephrine (LEVOPHED) Adult infusion 15 mcg/min (11/16/13 1045)  . vasopressin (PITRESSIN) infusion - *FOR SHOCK* 0.03 Units/min (11/16/13 0800)    Past Medical History  Diagnosis Date  . Chronic low back pain   . 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 Chronic neck pain   . OSA (obstructive sleep apnea)     CPAP @ bedtime  . Allergy   . Unspecified essential hypertension     Resistant, 2D Echo - EF-55-60  . Polycythemia   . CAD (coronary artery disease)     Vessel type  unspecified  . PVD (peripheral vascular disease)   . DDD (degenerative disc disease), cervical     Past Surgical History  Procedure Laterality Date  . Lower extrmity doppler      [place] 2008; no evidence for PAD  . US echocardiography  11/2008    Normal valves, mild LAE  . Left knee replacement  10/2006  . Cervical spine surgery  02/2008  . Joint replacement    . Spine surgery       Molli Barrows, [place], LDN, [place] Pager [phone] After Hours Pager [phone]

## 2013-11-16 NOTE — Consult Note (Signed)
WOC wound consult note Reason for Consult: Consult requested for buttocks and right leg.   Wound type: Entire buttocks and posterior scrotum with erythremia and patchy areas of skin loss.  Appearance consistent with moisture associated skin damage.  Area of dark purple deep tissue injury in the crease of buttocks, 3X3cm Pressure Ulcer POA: Yes Measurement: Right lower leg with full thickness stasis ulcer; 2.5X3X.2cm Wound bed: 100% red and moist. Small amt yellow drainage, no odor.   Periwound: Intact skin surrounding.  Right foot is purple and mottled, some toes with dry intact eschar.  VVS has seen in the past and recommended amputation. Topical treatment will not be effective for this site.  It is best practice to leave dry stable eschar intact. Dressing procedure/placement/frequency: Barrier cream to scrotum and buttocks to protect, repel moisture, and promote healing.  Foam dressing to outer leg wound.  If aggressive plan of care is desired to right foot, please consult VVS team for further plan of care. Please re-consult if further assistance is needed.  Thank-you,  Julien Girt MSN, [place], [place], [place], [place]

## 2013-11-16 NOTE — Progress Notes (Signed)
Utilization review completed. Glendi Mohiuddin, RN, BSN. 

## 2013-11-16 NOTE — Progress Notes (Signed)
Echocardiogram 2D Echocardiogram with Definity has been performed.  Fernando Lane 11/16/2013, 12:18 PM

## 2013-11-16 NOTE — Progress Notes (Addendum)
PULMONARY / CRITICAL CARE MEDICINE  Name: Fernando Lane MRN: [id number] DOB: [birth year]    ADMISSION DATE:  11/15/2013 CONSULTATION DATE:  11/15/2013  REFERRING MD : Oval Linsey EDP  CHIEF COMPLAINT:  SOB  INITIAL PRESENTATION: 63 yo male with R foot gangrene and pseudomembranous colitis presented to [facility] ER with septic shock and acute respiratory failure requiring intubation. Transferred to [facility] for further management.  STUDIES / SIGNIFICANT EVENTS:  5/29 TTE >>> EF 35%, severely dilated RV, RA invagination 9/3   Intubated in [place] ED, transferred to [place]:   VITAL SIGNS: Temp:  [97.4 F (36.3 C)-98.3 F (36.8 C)] 97.6 F (36.4 C) (09/04 0804) Pulse Rate:  [73-107] 86 (09/04 0833) Resp:  [14-28] 18 (09/04 0833) BP: (61-132)/(38-89) 116/78 mmHg (09/04 0833) SpO2:  [87 %-100 %] 100 % (09/04 0833) Arterial Line BP: (70-151)/(41-73) 121/71 mmHg (09/04 0730) FiO2 (%):  [40 %-100 %] 40 % (09/04 0833) Weight:  [111.8 kg (246 lb 7.6 oz)-112.8 kg (248 lb 10.9 oz)] 112.8 kg (248 lb 10.9 oz) (09/04 0400)  HEMODYNAMICS: CVP:  [16 mmHg-20 mmHg] 16 mmHg  VENTILATOR SETTINGS: Vent Mode:  [-] PSV;CPAP FiO2 (%):  [40 %-100 %] 40 % Set Rate:  [20 bmp-28 bmp] 28 bmp Vt Set:  [450 mL] 450 mL PEEP:  [5 cmH20-10 cmH20] 5 cmH20 Pressure Support:  [5 cmH20] 5 cmH20 Plateau Pressure:  [15 cmH20-21 cmH20] 15 cmH20  INTAKE / OUTPUT: Intake/Output     09/03 0701 - 09/04 0700 09/04 0701 - 09/05 0700   I.V. (mL/kg) 1621.3 (14.4) 106.7 (0.9)   NG/GT 30    IV Piggyback 350    Total Intake(mL/kg) 2001.3 (17.7) 106.7 (0.9)   Urine (mL/kg/hr) 155    Total Output 155     Net +1846.3 +106.7         PHYSICAL EXAMINATION: General:  Mechanically ventilated, synchronous Neuro:  Sedated but arouses to stimulation, gag / cough present HEENT:  OETT Cardiovascular:  Regular, no murmurs Lungs:  Bilateral air entry, rales / rhonchi Abdomen:  Soft, non-distended Musculoskeletal: No  edema Skin: Cyanosis to RLE, mottled extremities, sacral pressure wound  LABS:  CBC  Recent Labs Lab 11/15/13 1950 11/16/13 0500  WBC 47.1* 44.0*  HGB 15.6 15.9  HCT 48.8 48.3  PLT 829* 763*   Coag's  Recent Labs Lab 11/15/13 1950  INR 1.59*   BMET  Recent Labs Lab 11/15/13 1950 11/16/13 0500  NA 137 137  K 4.0 3.8  CL 99 99  CO2 18* 18*  BUN 51* 55*  CREATININE 3.42* 3.13*  GLUCOSE 229* 182*   Electrolytes  Recent Labs Lab 11/15/13 1950 11/16/13 0500  CALCIUM 7.5* 7.6*   Sepsis Markers  Recent Labs Lab 11/15/13 1921 11/15/13 1951  LATICACIDVEN 2.4*  --   PROCALCITON  --  0.26   ABG  Recent Labs Lab 11/15/13 2033 11/16/13 0515  PHART 7.200* 7.371  PCO2ART 47.4* 32.7*  PO2ART 97.0 115.0*   Liver Enzymes  Recent Labs Lab 11/15/13 1950 11/16/13 0500  AST 19 20  ALT 14 13  ALKPHOS 118* 113  BILITOT 1.0 0.9  ALBUMIN 2.4* 2.5*   Cardiac Enzymes  Recent Labs Lab 11/15/13 1951  TROPONINI 0.75*  PROBNP 9486.0*   Glucose  Recent Labs Lab 11/15/13 1826 11/16/13 0010 11/16/13 0428 11/16/13 0801  GLUCAP 188* 147* 151* 131*   IMAGING:  Dg Chest Port 1 View  11/15/2013   CLINICAL DATA:  Shortness of breath. Evaluate  endotracheal tube and central line.  EXAM: PORTABLE CHEST - 1 VIEW  COMPARISON:  Chest radiograph 11/15/2013.  FINDINGS: ET tube terminates in the mid trachea. NG tube courses inferior to the diaphragm. Right IJ central venous catheter tip projects over the expected location of the right internal jugular vein. Stable cardiomegaly. Persistent retrocardiac consolidation. Possible left pleural effusion.  IMPRESSION: ET tube terminates in the mid trachea.  Right sided central venous catheter tip projects over the expected location of the right internal jugular vein.  Retrocardiac consolidation may represent atelectasis or pneumonia.   Electronically Signed   By: Lovey Newcomer M.D.   On: 11/15/2013 19:48   ASSESSMENT /  PLAN:  PULMONARY A: Acute respiratory failure in setting of septic shock and possible pneumonia, intubated 9/3 COPD without evidence of exacerbation OSA on home CPAP P:   Extubate Supplemental oxygen Goal SpO2>92 Incentive spirometry / flutter valve Albuterol PRN No indication for systemic steroids CPAP qhs per home settings /  auto-titrate  CARDIOVASCULAR A:  Shock (hypovolemic, septic, cardiogenic) SVT on presentation Chronic systolic heart failure PVD with dry gangrene R foot, seen by Vascular 10/11/13, amputation recommended STEMI 08/07/13, LAD and RCA stenosis on cath 08/08/13, medical management recommended Pericardial effusion in setting of STEMI s/p pericardiocentesis 08/10/13, on colchicine as outpatient R IJ CVL placed 9/3 P:  Goal MAP >65 Levophed gtt Vasopressin gtt D/c Dopamine gtt ( no benefit, arrhythmogenic ) TTE Trend troponin / lactate Trend lactic acid Continue ASA, Plavix, Lipitor  Hold preadmission Coreg, Lisinopril, Colchicine Consult vascular  RENAL A:   AKI in setting of septic shock Increased anion gap metabolic acidosis Last CVP 16 P:   Goal CVP 10-12 CVP q4h D/c bicarbonate gtt ( no benefit ) Hold  Lasix D/c IVF  GASTROINTESTINAL A:   Nutrition GI Pxk P:   NPO TF per Nutritionist Pepcid  HEMATOLOGIC A:   Polycythemia vera DVT Px P:  Trend CBC Heparin [place] Preadmission Anagrelide  INFECTIOUS A:  Pseudomembranous colitis, 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  C-diff positive from stool PCR 11/12/13 (from outside hospital) >> Tx with  8/31 >>>9/3. P:   Follow blood, urine, sputum cx 9/3 D/c Flagyl 9/4 D/c oral Vancomycin 9/4 Start Fidaxomicin per Pharmacy 9/4 >>> Zosyn / Vancomycin 9/3 >>>  ENDOCRINE A:   Hyperglycemia. Relative adrenal  insufficiency ruled out ( cortisol 54 on 9/3 ) P:   SSI D/c Hydrocortisone  NEUROLOGIC A:   Acute metabolic encephalopathy P:   RASS goal 0 to -1 Fentanyl PRN  I have personally obtained history, examined patient, evaluated and interpreted laboratory and imaging results, reviewed medical records, formulated assessment / plan and placed orders.  CRITICAL CARE:  The patient is critically ill with multiple organ systems failure and requires high complexity decision making for assessment and support, frequent evaluation and titration of therapies, application of advanced monitoring technologies and extensive interpretation of multiple databases. Critical Care Time devoted to patient care services described in this note is 60 minutes.   Doree Fudge, MD [facility] [place] Pager: [phone]  11/16/2013, 10:15 AM

## 2013-11-17 DIAGNOSIS — I519 Heart disease, unspecified: Secondary | ICD-10-CM | POA: Diagnosis present

## 2013-11-17 DIAGNOSIS — I255 Ischemic cardiomyopathy: Secondary | ICD-10-CM | POA: Diagnosis present

## 2013-11-17 LAB — COMPREHENSIVE METABOLIC PANEL
ALK PHOS: 87 U/L (ref 39–117)
ALT: 14 U/L (ref 0–53)
ANION GAP: 17 — AB (ref 5–15)
AST: 12 U/L (ref 0–37)
Albumin: 2.4 g/dL — ABNORMAL LOW (ref 3.5–5.2)
BUN: 59 mg/dL — ABNORMAL HIGH (ref 6–23)
CALCIUM: 7.8 mg/dL — AB (ref 8.4–10.5)
CO2: 21 mEq/L (ref 19–32)
CREATININE: 2.51 mg/dL — AB (ref 0.50–1.35)
Chloride: 100 mEq/L (ref 96–112)
GFR calc Af Amer: 30 mL/min — ABNORMAL LOW (ref >90)
GFR calc non Af Amer: 25 mL/min — ABNORMAL LOW (ref >90)
GLUCOSE: 133 mg/dL — AB (ref 70–99)
Potassium: 3.2 mEq/L — ABNORMAL LOW (ref 3.7–5.3)
Sodium: 138 mEq/L (ref 137–147)
Total Bilirubin: 0.9 mg/dL (ref 0.3–1.2)
Total Protein: 4.9 g/dL — ABNORMAL LOW (ref 6.0–8.3)

## 2013-11-17 LAB — GLUCOSE, CAPILLARY
GLUCOSE-CAPILLARY: 114 mg/dL — AB (ref 70–99)
GLUCOSE-CAPILLARY: 133 mg/dL — AB (ref 70–99)
GLUCOSE-CAPILLARY: 134 mg/dL — AB (ref 70–99)
GLUCOSE-CAPILLARY: 135 mg/dL — AB (ref 70–99)
Glucose-Capillary: 136 mg/dL — ABNORMAL HIGH (ref 70–99)
Glucose-Capillary: 139 mg/dL — ABNORMAL HIGH (ref 70–99)
Glucose-Capillary: 75 mg/dL (ref 70–99)

## 2013-11-17 LAB — URINE CULTURE

## 2013-11-17 LAB — CBC WITH DIFFERENTIAL/PLATELET
BASOS ABS: 0 10*3/uL (ref 0.0–0.1)
BASOS PCT: 0 % (ref 0–1)
Eosinophils Absolute: 0 10*3/uL (ref 0.0–0.7)
Eosinophils Relative: 0 % (ref 0–5)
HCT: 43.8 % (ref 39.0–52.0)
Hemoglobin: 14.1 g/dL (ref 13.0–17.0)
Lymphocytes Relative: 3 % — ABNORMAL LOW (ref 12–46)
Lymphs Abs: 0.7 10*3/uL (ref 0.7–4.0)
MCH: 28.4 pg (ref 26.0–34.0)
MCHC: 32.2 g/dL (ref 30.0–36.0)
MCV: 88.3 fL (ref 78.0–100.0)
MONOS PCT: 2 % — AB (ref 3–12)
Monocytes Absolute: 0.5 10*3/uL (ref 0.1–1.0)
Neutro Abs: 23.4 10*3/uL — ABNORMAL HIGH (ref 1.7–7.7)
Neutrophils Relative %: 95 % — ABNORMAL HIGH (ref 43–77)
Platelets: 457 10*3/uL — ABNORMAL HIGH (ref 150–400)
RBC: 4.96 MIL/uL (ref 4.22–5.81)
RDW: 17.9 % — ABNORMAL HIGH (ref 11.5–15.5)
WBC: 24.6 10*3/uL — ABNORMAL HIGH (ref 4.0–10.5)

## 2013-11-17 MED ORDER — CETYLPYRIDINIUM CHLORIDE 0.05 % MT LIQD
7.0000 mL | Freq: Two times a day (BID) | OROMUCOSAL | Status: DC
  Administered 2013-11-17 – 2013-11-18 (×3): 7 mL via OROMUCOSAL

## 2013-11-17 MED ORDER — STARCH (THICKENING) PO POWD
ORAL | Status: DC | PRN
  Filled 2013-11-17: qty 227

## 2013-11-17 MED ORDER — CHLORHEXIDINE GLUCONATE 0.12 % MT SOLN
15.0000 mL | Freq: Two times a day (BID) | OROMUCOSAL | Status: DC
  Administered 2013-11-17 – 2013-11-19 (×3): 15 mL via OROMUCOSAL
  Filled 2013-11-17 (×3): qty 15

## 2013-11-17 MED ORDER — SODIUM CHLORIDE 0.9 % IV BOLUS (SEPSIS)
1000.0000 mL | Freq: Once | INTRAVENOUS | Status: AC
  Administered 2013-11-17: 1000 mL via INTRAVENOUS

## 2013-11-17 MED ORDER — POTASSIUM CHLORIDE 10 MEQ/50ML IV SOLN
10.0000 meq | INTRAVENOUS | Status: AC
  Administered 2013-11-17 (×2): 10 meq via INTRAVENOUS
  Filled 2013-11-17 (×2): qty 50

## 2013-11-17 MED ORDER — DOCUSATE SODIUM 100 MG PO CAPS
100.0000 mg | ORAL_CAPSULE | Freq: Every day | ORAL | Status: DC | PRN
  Administered 2013-11-17 – 2013-11-18 (×2): 100 mg via ORAL
  Filled 2013-11-17 (×4): qty 1

## 2013-11-17 NOTE — Progress Notes (Signed)
SLP Cancellation Note  Patient Details Name: Fernando Lane MRN: [id number] DOB: December 13, 1950   Cancelled treatment:        Pt. On CPAP this morning.  Will see when can tolerate nasal cannula for swallow assessment as soon as able.   Houston Siren 11/17/2013, 8:50 AM Orbie Pyo Colvin Caroli.Ed [employer] [phone]

## 2013-11-17 NOTE — Progress Notes (Signed)
PULMONARY / CRITICAL CARE MEDICINE  Name: Fernando Lane MRN: [id number] DOB: 07/18/1950    ADMISSION DATE:  11/15/2013 CONSULTATION DATE:  11/15/2013  REFERRING MD : Oval Linsey EDP  CHIEF COMPLAINT:  SOB  INITIAL PRESENTATION: 63 yo male with R foot gangrene and pseudomembranous colitis presented to [facility] ER with septic shock and acute respiratory failure requiring intubation. Transferred to [facility] for further management.  STUDIES / SIGNIFICANT EVENTS:  5/29 TTE >>> EF 35%, severely dilated RV, RA invagination 9/3   Intubated in [place] ED, transferred to [facility] 9/4   Extubated 9/4   Seen by Vascular 9/4   TTE >>> Limited study, akinetic apex with unusual appearance, cannot rule out distal muscular VSD    INTERVAL HISTORY:  Remains extubated but on vasopressors.  VITAL SIGNS: Temp:  [97.4 F (36.3 C)-98.6 F (37 C)] 97.6 F (36.4 C) (09/05 0751) Pulse Rate:  [76-95] 83 (09/05 0700) Resp:  [15-26] 18 (09/05 0700) BP: (69-116)/(45-88) 96/64 mmHg (09/05 0700) SpO2:  [82 %-100 %] 92 % (09/05 0700) Arterial Line BP: (83-125)/(44-74) 113/58 mmHg (09/04 2300) FiO2 (%):  [40 %] 40 % (09/04 1000) Weight:  [113.1 kg (249 lb 5.4 oz)] 113.1 kg (249 lb 5.4 oz) (09/05 0545)  HEMODYNAMICS: CVP:  [18 mmHg-23 mmHg] 23 mmHg  VENTILATOR SETTINGS: Vent Mode:  [-] PSV;CPAP FiO2 (%):  [40 %] 40 % PEEP:  [5 cmH20] 5 cmH20 Pressure Support:  [5 cmH20] 5 cmH20  INTAKE / OUTPUT: Intake/Output     09/04 0701 - 09/05 0700 09/05 0701 - 09/06 0700   P.O. 120    I.V. (mL/kg) 733.8 (6.5)    NG/GT     IV Piggyback 187.5    Total Intake(mL/kg) 1041.3 (9.2)    Urine (mL/kg/hr) 1090 (0.4)    Total Output 1090     Net -48.7           PHYSICAL EXAMINATION: General:  No distress Neuro:  Sleepy but arouses to stimulation HEENT:  CPAP mask on Cardiovascular:  Regular, no murmurs Lungs:  CTAB, somewhat diminished Abdomen:  Soft, non-distended Musculoskeletal: No edema Skin: Cyanosis to RLE, mottled  extremities, sacral pressure wound  LABS:  CBC  Recent Labs Lab 11/15/13 1950 11/16/13 0500 11/17/13 0424  WBC 47.1* 44.0* 24.6*  HGB 15.6 15.9 14.1  HCT 48.8 48.3 43.8  PLT 829* 763* 457*   Coag's  Recent Labs Lab 11/15/13 1950  INR 1.59*   BMET  Recent Labs Lab 11/15/13 1950 11/16/13 0500 11/17/13 0424  NA 137 137 138  K 4.0 3.8 3.2*  CL 99 99 100  CO2 18* 18* 21  BUN 51* 55* 59*  CREATININE 3.42* 3.13* 2.51*  GLUCOSE 229* 182* 133*   Electrolytes  Recent Labs Lab 11/15/13 1950 11/16/13 0500 11/17/13 0424  CALCIUM 7.5* 7.6* 7.8*   Sepsis Markers  Recent Labs Lab 11/15/13 1921 11/15/13 1951  LATICACIDVEN 2.4*  --   PROCALCITON  --  0.26   ABG  Recent Labs Lab 11/15/13 2033 11/16/13 0515  PHART 7.200* 7.371  PCO2ART 47.4* 32.7*  PO2ART 97.0 115.0*   Liver Enzymes  Recent Labs Lab 11/15/13 1950 11/16/13 0500 11/17/13 0424  AST 19 20 12   ALT 14 13 14   ALKPHOS 118* 113 87  BILITOT 1.0 0.9 0.9  ALBUMIN 2.4* 2.5* 2.4*   Cardiac Enzymes  Recent Labs Lab 11/15/13 1951 11/16/13 0957 11/16/13 1557 11/16/13 2221  TROPONINI 0.75* 2.63* 2.07* 1.98*  PROBNP 9486.0*  --   --   --  Glucose  Recent Labs Lab 11/16/13 1223 11/16/13 1603 11/16/13 2039 11/16/13 2353 11/17/13 0419 11/17/13 0711  GLUCAP 112* 138* 125* 133* 136* 135*   IMAGING:  Dg Chest Port 1 View  11/16/2013   CLINICAL DATA:  Evaluate airspace disease.  EXAM: PORTABLE CHEST - 1 VIEW  COMPARISON:  11/15/2013  FINDINGS: Support devices are in stable position. Cardiomegaly with vascular congestion. Low lung volumes with bibasilar opacities and layering effusions. No real change since prior study.  IMPRESSION: Stable bibasilar opacities and layering effusions. Low lung volumes.  Cardiomegaly, vascular congestion   Electronically Signed   By: Rolm Baptise M.D.   On: 11/16/2013 07:34   ASSESSMENT / PLAN:  PULMONARY A: Acute respiratory failure in setting of septic  shock and possible pneumonia, intubated 9/3, extubated 9/4 COPD without evidence of exacerbation OSA on home CPAP P:   Supplemental oxygen Goal SpO2>92 Incentive spirometry / flutter valve Albuterol PRN No indication for systemic steroids CPAP qhs per home settings /  auto-titrate  CARDIOVASCULAR A:  Shock (hypovolemic, septic, cardiogenic) Demand ischemia, less likely NSTEMI SVT on presentation Chronic systolic heart failure Cannot r/o distal muscular VSD on TTE 9/4 PVD with dry gangrene R foot, seen by Vascular 10/11/13, amputation recommended STEMI 08/07/13, LAD and RCA stenosis on cath 08/08/13, medical management recommended Pericardial effusion in setting of STEMI s/p pericardiocentesis 08/10/13, on colchicine as outpatient R IJ CVL placed 9/3 P:  Goal MAP >65 Levophed gtt Vasopressin gtt Continue ASA, Plavix, Lipitor  Hold preadmission Coreg, Lisinopril, Colchicine Consult Cardiology  RENAL A:   AKI in setting of septic shock, improving Increased anion gap metabolic acidosis, resolved P:   Goal CVP 10-12 CVP q4h NS 1000 x 1 Hold  Lasix  GASTROINTESTINAL A:   Nutrition GI Pxk Suspected dysphagia P:   D/c TF Pepcid NPO Swallow evaluation  HEMATOLOGIC A:   Polycythemia vera DVT Px P:  Trend CBC Heparin [place] Preadmission Anagrelide  INFECTIOUS A:  Pseudomembranous colitis, 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 Relative adrenal insufficiency ruled out ( cortisol 54 on 9/3 ) P:   SSI D/c Hydrocortisone  NEUROLOGIC A:   Acute metabolic encephalopathy, resolved P:   Fentanyl PRN  I have personally obtained history, examined patient,  evaluated and interpreted laboratory and imaging results, reviewed medical records, formulated assessment / plan and placed orders.  CRITICAL CARE:  The patient is critically ill with multiple organ systems failure and requires high complexity decision making for assessment and support, frequent evaluation and titration of therapies, application of advanced monitoring technologies and extensive interpretation of multiple databases. Critical Care Time devoted to patient care services described in this note is 35 minutes.   Doree Fudge, MD Pulmonary and [place] Pager: [phone]  11/17/2013, 8:10 AM

## 2013-11-17 NOTE — Evaluation (Addendum)
Clinical/Bedside Swallow Evaluation Patient Details  Name: Fernando Lane MRN: [id number] Date of Birth: 08-13-1950  Today's Date: 11/17/2013 Time: 4196-2229 SLP Time Calculation (min): 20 min  Past Medical History:  Past Medical History  Diagnosis Date  . Chronic low back pain   . 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 Chronic neck pain   . OSA (obstructive sleep apnea)     CPAP @ bedtime  . Allergy   . Unspecified essential hypertension     Resistant, 2D Echo - EF-55-60  . Polycythemia   . CAD (coronary artery disease)     Vessel type unspecified  . PVD (peripheral vascular disease)   . DDD (degenerative disc disease), cervical    Past Surgical History:  Past Surgical History  Procedure Laterality Date  . Lower extrmity doppler      [place] 2008; no evidence for PAD  . US echocardiography  11/2008    Normal valves, mild LAE  . Left knee replacement  10/2006  . Cervical spine surgery  02/2008  . Joint replacement    . Spine surgery     HPI:  63 year old male with PMH:  polycythemia vera, OSA (nocturnal CPAP), CHF, and CAD from SNF aditted with SOB. Intubated 9/3-9/4. CXR stable bibasilar opacities and layering effusions. Low lung volumes. Cardiomegaly, vascular congestion.  Found to have hypovolemia, sepsis, cardiogenic.   Assessment / Plan / Recommendation Clinical Impression  Pt. demonstrates evidence of an acute and reversable dysphagia likely due to short term intubation.  Immediate cough following thin liquids intermittently due to possible edema preventing full epiglottic coverage.  Pt. given mild-moderate cues to remain quiet immediately following swallow with clinical rationale.  SLP recommending Dys 3 (precautionary) and nectar thick liquids, pills whole in applesauce, small sips and no straws and ST follow up for liquid upgrade.     Aspiration Risk  Moderate    Diet Recommendation Dysphagia 3 (Mechanical  Soft);Nectar-thick liquid   Liquid Administration via: No straw;Cup Medication Administration: Whole meds with puree Supervision: Patient able to self feed;Full supervision/cueing for compensatory strategies Compensations: Slow rate;Small sips/bites Postural Changes and/or Swallow Maneuvers: Seated upright 90 degrees    Other  Recommendations Oral Care Recommendations: Oral care BID   Follow Up Recommendations  None    Frequency and Duration min 2x/week  2 weeks   Pertinent Vitals/Pain WDL    SLP Swallow Goals     Swallow Study        Oral/Motor/Sensory Function Overall Oral Motor/Sensory Function: Appears within functional limits for tasks assessed   Ice Chips Ice chips: Within functional limits Presentation: Spoon   Thin Liquid Thin Liquid: Impaired Presentation: Cup;Straw Oral Phase Impairments: Reduced labial seal Oral Phase Functional Implications: Right anterior spillage;Left anterior spillage Pharyngeal  Phase Impairments: Cough - Immediate;Cough - Delayed;Throat Clearing - Delayed    Nectar Thick Nectar Thick Liquid: Not tested   Honey Thick Honey Thick Liquid: Not tested   Puree Puree: Within functional limits   Solid   GO    Solid: Within functional limits       Houston Siren 11/17/2013,11:06 AM Orbie Pyo Colvin Caroli.Ed [employer] [phone]

## 2013-11-17 NOTE — Consult Note (Signed)
CARDIOLOGY CONSULT NOTE   Patient ID: Fernando Lane MRN: [id number] DOB/AGE: 64-Jul-1952 64 y.o.  Admit Date: 11/15/2013  Primary Physician: Gwendolyn Grant, MD  Primary Cardiologist:  [facility],  [facility]  Clinical Summary Fernando Lane is a 63 y.o.male.  He is currently admitted and being treated for shock. He has a complex cardiac history. His most recent echo is read as abnormal near the apex. The question is raised as to whether he could have a VSD. Cardiology is consulted for further input.  In May, 2015 the patient had an LAD infarct. His ejection fraction was in the 20% range. Catheterization showed that he was not a candidate for either PCI or CABG. In addition he has severe peripheral arterial disease that could not be revascularized. It was known from that time that the patient had akinesis of his apex. He also has severe right ventricular dysfunction. This was thought to probably related to right ventricular infarction.    No Known Allergies  Medications Scheduled Medications: . anagrelide  0.5 mg Oral BID  . antiseptic oral rinse  7 mL Mouth Rinse q12n4p  . aspirin  325 mg Oral Daily  . atorvastatin  80 mg Oral q1800  . chlorhexidine  15 mL Mouth Rinse BID  . Chlorhexidine Gluconate Cloth  6 each Topical Q0600  . famotidine (PEPCID) IV  20 mg Intravenous QHS  . fidaxomicin  200 mg Oral BID  . heparin  5,000 Units Subcutaneous 3 times per day  . insulin aspart  2-6 Units Subcutaneous 6 times per day  . mupirocin ointment  1 application Nasal BID  . piperacillin-tazobactam (ZOSYN)  IV  3.375 g Intravenous Q8H  . vancomycin  1,500 mg Intravenous Q48H     Infusions: . norepinephrine (LEVOPHED) Adult infusion 8 mcg/min (11/17/13 1100)  . vasopressin (PITRESSIN) infusion - *FOR SHOCK* 0.03 Units/min (11/17/13 0700)     PRN Medications:  sodium chloride, albuterol, docusate sodium, fentaNYL, food thickener   Past Medical History  Diagnosis Date    . Chronic low back pain   . 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 Chronic neck pain   . OSA (obstructive sleep apnea)     CPAP @ bedtime  . Allergy   . Unspecified essential hypertension     Resistant, 2D Echo - EF-55-60  . Polycythemia   . CAD (coronary artery disease)     Vessel type unspecified  . PVD (peripheral vascular disease)   . DDD (degenerative disc disease), cervical     Past Surgical History  Procedure Laterality Date  . Lower extrmity doppler      [place] 2008; no evidence for PAD  . US echocardiography  11/2008    Normal valves, mild LAE  . Left knee replacement  10/2006  . Cervical spine surgery  02/2008  . Joint replacement    . Spine surgery      Family History  Problem Relation Age of Onset  . Heart attack Father     Social History Fernando Lane reports that he quit smoking about 45 years ago. His smoking use included Cigarettes and Pipe. He smoked 0.00 packs per day for 0 years. He has quit using smokeless tobacco. Fernando Lane reports that he drinks about 3.6 ounces of alcohol per week.  Review of Systems   Patient denies fever, chills, headache, sweats, rash, change in vision, change in hearing, urinary symptoms.  Physical Examination Blood pressure 100/74, pulse  84, temperature 97.6 F (36.4 C), temperature source Oral, resp. rate 22, height 5\' 6"  (1.676 m), weight 249 lb 5.4 oz (113.1 kg), SpO2 97.00%.  Intake/Output Summary (Last 24 hours) at 11/17/13 1418 Last data filed at 11/17/13 1338  Gross per 24 hour  Intake 3159.59 ml  Output   1105 ml  Net 2054.59 ml    The patient looks remarkably good for his multitude of problems. He is lying flat in bed. He has multiple IV lines in. Lungs reveal scattered rhonchi. Cardiac exam reveals distant heart sounds. With the available equipment, I cannot fully assess any significant murmurs.  Prior Cardiac Testing/Procedures  Lab Results  Basic Metabolic  Panel:  Recent Labs Lab 11/15/13 1950 11/16/13 0500 11/17/13 0424  NA 137 137 138  K 4.0 3.8 3.2*  CL 99 99 100  CO2 18* 18* 21  GLUCOSE 229* 182* 133*  BUN 51* 55* 59*  CREATININE 3.42* 3.13* 2.51*  CALCIUM 7.5* 7.6* 7.8*    Liver Function Tests:  Recent Labs Lab 11/15/13 1950 11/16/13 0500 11/17/13 0424  AST 19 20 12   ALT 14 13 14   ALKPHOS 118* 113 87  BILITOT 1.0 0.9 0.9  PROT 5.1* 5.3* 4.9*  ALBUMIN 2.4* 2.5* 2.4*    CBC:  Recent Labs Lab 11/15/13 1950 11/16/13 0500 11/17/13 0424  WBC 47.1* 44.0* 24.6*  NEUTROABS 45.3*  --  23.4*  HGB 15.6 15.9 14.1  HCT 48.8 48.3 43.8  MCV 91.4 87.0 88.3  PLT 829* 763* 457*    Cardiac Enzymes:  Recent Labs Lab 11/15/13 1951 11/16/13 0957 11/16/13 1557 11/16/13 2221  TROPONINI 0.75* 2.63* 2.07* 1.98*    BNP: No components found with this basename: POCBNP,    Radiology: Dg Chest Port 1 View  11/16/2013   CLINICAL DATA:  Evaluate airspace disease.  EXAM: PORTABLE CHEST - 1 VIEW  COMPARISON:  11/15/2013  FINDINGS: Support devices are in stable position. Cardiomegaly with vascular congestion. Low lung volumes with bibasilar opacities and layering effusions. No real change since prior study.  IMPRESSION: Stable bibasilar opacities and layering effusions. Low lung volumes.  Cardiomegaly, vascular congestion   Electronically Signed   By: Rolm Baptise M.D.   On: 11/16/2013 07:34   Dg Chest Port 1 View  11/15/2013   CLINICAL DATA:  Shortness of breath. Evaluate endotracheal tube and central line.  EXAM: PORTABLE CHEST - 1 VIEW  COMPARISON:  Chest radiograph 11/15/2013.  FINDINGS: ET tube terminates in the mid trachea. NG tube courses inferior to the diaphragm. Right IJ central venous catheter tip projects over the expected location of the right internal jugular vein. Stable cardiomegaly. Persistent retrocardiac consolidation. Possible left pleural effusion.  IMPRESSION: ET tube terminates in the mid trachea.  Right sided  central venous catheter tip projects over the expected location of the right internal jugular vein.  Retrocardiac consolidation may represent atelectasis or pneumonia.   Electronically Signed   By: Lovey Newcomer M.D.   On: 11/15/2013 19:48     ECG: There is no EKG from this admission. Previous EKGs show interventricular conduction delay.  Telemetry:    I have reviewed telemetry today November 17, 2013. There is normal sinus rhythm.   Impression and Recommendations    Ischemic cardiomyopathy:     I reviewed the current echo and prior echoes. In May, 2015 the patient had akinesis of the apex. Currently the apex looks more abnormal. There may be a localized aneurysmal dilatation of the apex. The question has been  raised as to whether or not there could be a intramuscular VSD. This is questioned by the appearance, but not by any Doppler data. I suspect that this finding is progression of left ventricular dysfunction occurring after the patient's MI, as revascularization could not be done. When the patient stabilizes from his current illness, MRI can be considered to fully assess his left ventricle and right ventricle including his apex and ventricular septum. I would recommend no other changes in his care at this time.    CORONARY ARTERY DISEASE, VESSEL TYPE UNSPECIFIED   Atherosclerosis of native arteries of the extremities with ulceration(440.23)   Septic shock   Acute respiratory failure   Pneumonia   C. difficile colitis   AKI (acute kidney injury)   Dry gangrene    Right ventricular dysfunction    The patient has severe right ventricular dysfunction. He had this in May, 2015. It may be somewhat worse now. Historically it is thought that he may have had right ventricular infarction along with his left ventricular infarction. MRI may also help [place] assess this further.  Daryel November, MD   11/17/2013, 2:18 PM

## 2013-11-17 NOTE — Progress Notes (Signed)
Patient and family educated about C-dif precautions and MRSA precautions.  The family was instructed to wear the gown and gloves and remove before leaving the room.  They were also instructed to wash their hands vigorously with soap and water after personal protective equipment removal.  Patient was placed on C-diff precautions due to patient being treated for C-diff outpatient and currently. The patient has not had since admission.  C-diff sign was placed on the door and bleach wipes in the yellow bin outside the door.  Dr. Gar Gibbon aware.   Hemphill, Lauralyn Primes, RN

## 2013-11-17 NOTE — Progress Notes (Signed)
No real change On more levophed today Toes right foot dusky but unchanged Left foot cool unchanged  Will recheck in a few days after his sepsis issues improve Leg is probably not source of current issues  Ruta Hinds, MD [facility]: [phone] Pager: [phone]

## 2013-11-17 NOTE — Progress Notes (Signed)
Placed pt. On cpap. Pt. Is tolerating well at this time.  

## 2013-11-18 LAB — BASIC METABOLIC PANEL
ANION GAP: 13 (ref 5–15)
BUN: 56 mg/dL — ABNORMAL HIGH (ref 6–23)
CHLORIDE: 104 meq/L (ref 96–112)
CO2: 22 mEq/L (ref 19–32)
Calcium: 7.8 mg/dL — ABNORMAL LOW (ref 8.4–10.5)
Creatinine, Ser: 1.97 mg/dL — ABNORMAL HIGH (ref 0.50–1.35)
GFR calc non Af Amer: 34 mL/min — ABNORMAL LOW (ref >90)
GFR, EST AFRICAN AMERICAN: 40 mL/min — AB (ref >90)
Glucose, Bld: 125 mg/dL — ABNORMAL HIGH (ref 70–99)
POTASSIUM: 3.3 meq/L — AB (ref 3.7–5.3)
SODIUM: 139 meq/L (ref 137–147)

## 2013-11-18 LAB — CULTURE, RESPIRATORY W GRAM STAIN

## 2013-11-18 LAB — CBC
HEMATOCRIT: 44.2 % (ref 39.0–52.0)
Hemoglobin: 13.9 g/dL (ref 13.0–17.0)
MCH: 28 pg (ref 26.0–34.0)
MCHC: 31.4 g/dL (ref 30.0–36.0)
MCV: 89.1 fL (ref 78.0–100.0)
PLATELETS: 413 10*3/uL — AB (ref 150–400)
RBC: 4.96 MIL/uL (ref 4.22–5.81)
RDW: 18 % — AB (ref 11.5–15.5)
WBC: 23.5 10*3/uL — ABNORMAL HIGH (ref 4.0–10.5)

## 2013-11-18 LAB — GLUCOSE, CAPILLARY
GLUCOSE-CAPILLARY: 112 mg/dL — AB (ref 70–99)
GLUCOSE-CAPILLARY: 137 mg/dL — AB (ref 70–99)
GLUCOSE-CAPILLARY: 138 mg/dL — AB (ref 70–99)
Glucose-Capillary: 115 mg/dL — ABNORMAL HIGH (ref 70–99)
Glucose-Capillary: 116 mg/dL — ABNORMAL HIGH (ref 70–99)

## 2013-11-18 MED ORDER — INSULIN ASPART 100 UNIT/ML ~~LOC~~ SOLN
0.0000 [IU] | Freq: Three times a day (TID) | SUBCUTANEOUS | Status: DC
  Administered 2013-11-18: 2 [IU] via SUBCUTANEOUS

## 2013-11-18 MED ORDER — INSULIN ASPART 100 UNIT/ML ~~LOC~~ SOLN: 0.0000 [IU] | Freq: Every day | SUBCUTANEOUS | Status: DC

## 2013-11-18 MED ORDER — SODIUM CHLORIDE 0.9 % IV BOLUS (SEPSIS)
1000.0000 mL | Freq: Once | INTRAVENOUS | Status: AC
  Administered 2013-11-18: 1000 mL via INTRAVENOUS

## 2013-11-18 MED ORDER — POTASSIUM CHLORIDE CRYS ER 20 MEQ PO TBCR
40.0000 meq | EXTENDED_RELEASE_TABLET | Freq: Once | ORAL | Status: AC
  Administered 2013-11-18: 40 meq via ORAL
  Filled 2013-11-18: qty 2

## 2013-11-18 NOTE — Progress Notes (Signed)
eLink Physician-Brief Progress Note Patient Name: Fernando Lane DOB: 09-18-1950 MRN: [id number]   Date of Service  11/18/2013  HPI/Events of Note   hypokalemia  eICU Interventions  repleted     Intervention Category Intermediate Interventions: Electrolyte abnormality - evaluation and management  Merton Border 11/18/2013, 6:05 AM

## 2013-11-18 NOTE — Progress Notes (Signed)
ANTIBIOTIC CONSULT NOTE - Follow Up  Pharmacy Consult:  Fidaxomicin, Vancomycin, Zosyn Indication:  Recent C.diff (11/12/13)   No Known Allergies  Patient Measurements: Height: 5\' 6"  (167.6 cm) Weight: 254 lb 10.1 oz (115.5 kg) IBW/kg (Calculated) : 63.8  Vital Signs: Temp: 98 F (36.7 C) (09/06 0731) Temp src: Oral (09/06 0731) BP: 99/71 mmHg (09/06 1000) Pulse Rate: 82 (09/06 1000)  Labs:  Recent Labs  11/16/13 0500 11/17/13 0424 11/18/13 0403  WBC 44.0* 24.6* 23.5*  HGB 15.9 14.1 13.9  PLT 763* 457* 413*  CREATININE 3.13* 2.51* 1.97*   Estimated Creatinine Clearance: 45.3 ml/min (by C-G formula based on Cr of 1.97).  Microbiology: Recent Results (from the past 720 hour(s))  MRSA PCR SCREENING     Status: Abnormal   Collection Time    11/15/13  6:02 PM      Result Value Ref Range Status   MRSA by PCR POSITIVE (*) NEGATIVE Final   Comment:            The GeneXpert MRSA Assay (FDA     approved for NASAL specimens     only), is one component of a     comprehensive MRSA colonization     surveillance program. It is not     intended to diagnose MRSA     infection nor to guide or     monitor treatment for     MRSA infections.     RESULT CALLED TO, READ BACK BY AND VERIFIED WITH:     D.TURNER AT 2106 11/15/13  CULTURE, BLOOD (ROUTINE X 2)     Status: None   Collection Time    11/15/13  8:37 PM      Result Value Ref Range Status   Specimen Description BLOOD LEFT ARM   Final   Special Requests BOTTLES DRAWN AEROBIC ONLY 8CC   Final   Culture  Setup Time     Final   Value: 11/16/2013 04:13     Performed at [facility]   Culture     Final   Value:        BLOOD CULTURE RECEIVED NO GROWTH TO DATE CULTURE WILL BE HELD FOR 5 DAYS BEFORE ISSUING A FINAL NEGATIVE REPORT     Performed at [facility]   Report Status PENDING   Incomplete  URINE CULTURE     Status: None   Collection Time    11/15/13 10:32 PM      Result Value Ref Range Status   Specimen Description URINE, CATHETERIZED   Final   Special Requests NONE   Final   Culture  Setup Time     Final   Value: 11/16/2013 00:55     Performed at [place]     Final   Value: 3,000 COLONIES/ML     Performed at [facility]   Culture     Final   Value: INSIGNIFICANT GROWTH     Performed at [facility]   Report Status 11/17/2013 FINAL   Final  CULTURE, RESPIRATORY (NON-EXPECTORATED)     Status: None   Collection Time    11/15/13 11:30 PM      Result Value Ref Range Status   Specimen Description TRACHEAL ASPIRATE   Final   Special Requests NONE   Final   Gram Stain     Final   Value: MODERATE WBC PRESENT,BOTH PMN AND MONONUCLEAR     RARE SQUAMOUS EPITHELIAL CELLS  PRESENT     RARE GRAM POSITIVE COCCI     IN PAIRS RARE GRAM POSITIVE RODS     Performed at [facility]   Culture     Final   Value: Culture reincubated for better growth     Performed at [facility]   Report Status PENDING   Incomplete  CULTURE, BLOOD (ROUTINE X 2)     Status: None   Collection Time    11/16/13  8:45 AM      Result Value Ref Range Status   Specimen Description BLOOD LEFT ANTECUBITAL   Final   Special Requests BOTTLES DRAWN AEROBIC ONLY 4CC   Final   Culture  Setup Time     Final   Value: 11/16/2013 14:27     Performed at [facility]   Culture     Final   Value:        BLOOD CULTURE RECEIVED NO GROWTH TO DATE CULTURE WILL BE HELD FOR 5 DAYS BEFORE ISSUING A FINAL NEGATIVE REPORT     Performed at [facility]   Report Status PENDING   Incomplete    Medical History: Past Medical History  Diagnosis Date  . Chronic low back pain   . 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 Chronic neck pain   . OSA (obstructive sleep apnea)     CPAP @ bedtime  . Allergy   . Unspecified essential hypertension     Resistant, 2D Echo - EF-55-60  . Polycythemia   . CAD (coronary  artery disease)     Vessel type unspecified  . PVD (peripheral vascular disease)   . DDD (degenerative disc disease), cervical    Assessment: 69 YOM transferred from [facility] ED with severe respiratory distress and sepsis.  Pharmacy consulted to manage antibiotics for sepsis and recent C.diff infection.  His leukocytosis has improved from 44K>>23.5K but remains elevated.  He is afebrile and his culture data is un-revealing.  His acute renal failure is improved with creatinine now at 1.97 and an estimated crcl of 44ml/min.  I suspect he is clearing his Vancomycin better now and will plan to check a level with AM labs.  Vanc 9/3 >> Zosyn 9/3 >> Flagyl 9/3 >> 9/4 PO Vanc 9/3 >> 9/4 Fidaxomicin 8/31 >> 9/3 - Restart 9/4>>(through 9/14)  Goal of Therapy:  Vancomycin trough level 15-20 mcg/ml  Plan:   - Continue Vanc 1500mg  IV Q48H - Zosyn 3.375gm IV Q8H, 4 hr infusion - Fidaxomicin 200mg  bid x 10 day course - Monitor renal fxn, clinical course, vanc random level in AM  Rober Minion, PharmD., MS Clinical Pharmacist Pager:  [phone] Thank you for allowing pharmacy to be part of this patients care team. 11/18/2013, 11:03 AM

## 2013-11-18 NOTE — Progress Notes (Signed)
PULMONARY / CRITICAL CARE MEDICINE  Name: Fernando Lane MRN: [id number] DOB: 02-Dec-1950    ADMISSION DATE:  11/15/2013 CONSULTATION DATE:  11/15/2013  REFERRING MD : Oval Linsey EDP  CHIEF COMPLAINT:  SOB  INITIAL PRESENTATION: 63 yo male with R foot gangrene and pseudomembranous colitis presented to [facility] ER with septic shock and acute respiratory failure requiring intubation. Transferred to [facility] for further management.  STUDIES / SIGNIFICANT EVENTS:  5/29 TTE >>> EF 35%, severely dilated RV, RA invagination 9/3   Intubated in [place] ED, transferred to [facility] 9/4   Extubated 9/4   Seen by Vascular 9/4   TTE >>> Limited study, akinetic apex with unusual appearance, cannot rule out distal muscular VSD 9/5   Cardiology consulted    INTERVAL HISTORY:  Unable to titrate pressors to off  VITAL SIGNS: Temp:  [97.3 F (36.3 C)-97.9 F (36.6 C)] 97.5 F (36.4 C) (09/06 0400) Pulse Rate:  [47-91] 50 (09/06 0600) Resp:  [15-24] 17 (09/06 0700) BP: (80-112)/(51-74) 91/63 mmHg (09/06 0700) SpO2:  [90 %-100 %] 100 % (09/06 0600) Arterial Line BP: (67-109)/(52-67) 100/58 mmHg (09/06 0700) Weight:  [115.5 kg (254 lb 10.1 oz)] 115.5 kg (254 lb 10.1 oz) (09/06 0402)  HEMODYNAMICS: CVP:  [10 mmHg-20 mmHg] 18 mmHg  VENTILATOR SETTINGS:   INTAKE / OUTPUT: Intake/Output     09/05 0701 - 09/06 0700 09/06 0701 - 09/07 0700   P.O. 360    I.V. (mL/kg) 592.1 (5.1)    IV Piggyback 2812.5    Total Intake(mL/kg) 3764.6 (32.6)    Urine (mL/kg/hr) 1050 (0.4)    Total Output 1050     Net +2714.6           PHYSICAL EXAMINATION: General:  No distress Neuro:  Awake, alert, cooperative HEENT:  Moist membranes Cardiovascular:  Regular, no murmurs Lungs:  Diminished bilateral air entry Abdomen:  Soft, non-distended Musculoskeletal: No edema Skin: Cyanosis to RLE, mottled extremities, sacral pressure wound  LABS:  CBC  Recent Labs Lab 11/16/13 0500 11/17/13 0424 11/18/13 0403  WBC 44.0*  24.6* 23.5*  HGB 15.9 14.1 13.9  HCT 48.3 43.8 44.2  PLT 763* 457* 413*   Coag's  Recent Labs Lab 11/15/13 1950  INR 1.59*   BMET  Recent Labs Lab 11/16/13 0500 11/17/13 0424 11/18/13 0403  NA 137 138 139  K 3.8 3.2* 3.3*  CL 99 100 104  CO2 18* 21 22  BUN 55* 59* 56*  CREATININE 3.13* 2.51* 1.97*  GLUCOSE 182* 133* 125*   Electrolytes  Recent Labs Lab 11/16/13 0500 11/17/13 0424 11/18/13 0403  CALCIUM 7.6* 7.8* 7.8*   Sepsis Markers  Recent Labs Lab 11/15/13 1921 11/15/13 1951  LATICACIDVEN 2.4*  --   PROCALCITON  --  0.26   ABG  Recent Labs Lab 11/15/13 2033 11/16/13 0515  PHART 7.200* 7.371  PCO2ART 47.4* 32.7*  PO2ART 97.0 115.0*   Liver Enzymes  Recent Labs Lab 11/15/13 1950 11/16/13 0500 11/17/13 0424  AST 19 20 12   ALT 14 13 14   ALKPHOS 118* 113 87  BILITOT 1.0 0.9 0.9  ALBUMIN 2.4* 2.5* 2.4*   Cardiac Enzymes  Recent Labs Lab 11/15/13 1951 11/16/13 0957 11/16/13 1557 11/16/13 2221  TROPONINI 0.75* 2.63* 2.07* 1.98*  PROBNP 9486.0*  --   --   --    Glucose  Recent Labs Lab 11/17/13 0711 11/17/13 1117 11/17/13 1516 11/17/13 1953 11/17/13 2324 11/18/13 0357  GLUCAP 135* 114* 134* 139* 75 115*   IMAGING:  No results found.  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 CVL placed 9/3 H/o STEMI 08/07/13, LAD and RCA stenosis on cath  08/08/13, medical management recommended H/o Pericardial effusion in setting of STEMI s/p pericardiocentesis 08/10/13, on colchicine as outpatient P:  Cardiology following, input appreciated Goal MAP >65 Levophed gtt Vasopressin gtt Continue ASA, Plavix, Lipitor  Hold preadmission Coreg, Lisinopril, [place] Heart MRI when stable  RENAL A:   AKI in setting of septic shock, improving Increased anion gap metabolic acidosis, resolved Hypokalemia CVP likely unreliable in setting of severe R heart dysfunction, also is likely preload dependent P:   CVP q4h, but use for correlation only NS 1000 x 1 Hold  Lasix K 40  GASTROINTESTINAL A:   Nutrition GI Pxk Suspected dysphagia P:   Pepcid D3 diet  HEMATOLOGIC A:   Polycythemia vera DVT Px P:  Trend CBC Heparin [place] Preadmission Anagrelide  INFECTIOUS A:  Pseudomembranous colitis, 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 Relative adrenal insufficiency ruled out ( cortisol 54 on 9/3 ) P:   SSI  NEUROLOGIC A:   Acute metabolic encephalopathy, resolved P:   Fentanyl PRN  I have personally obtained history, examined patient, evaluated and interpreted laboratory and imaging results, reviewed medical records, formulated assessment / plan and placed orders.  CRITICAL CARE:  The patient is critically ill with multiple organ systems failure and requires high complexity decision making for assessment and support, frequent evaluation and titration of therapies, application of advanced monitoring technologies and extensive interpretation of multiple databases. Critical Care Time  devoted to patient care services described in this note is 35 minutes.   Doree Fudge, MD Pulmonary and [place] Pager: [phone]  11/18/2013, 7:27 AM

## 2013-11-18 NOTE — Progress Notes (Signed)
Placed pt. On cpap. Pt. Tolerating well at this time. 

## 2013-11-18 NOTE — Progress Notes (Addendum)
Patient ID: Fernando Lane, male   DOB: October 10, 1950, 64 y.o.   MRN: [id number]  I reviewed the notes since my consult note September 5. No further thoughts as of today. When the patient his stabilize from his current significant illness, further assessment of his LV and RV will be appropriate. MRI may be the best approach if it can be done safely. It is possible that gadolinium might have to be considered for the complete MRI study. This may not be safe with his current renal dysfunction. We will need further input from our MRI and CT reading cardiologist concerning the best test to do next. TEE would help with some of the assessment, but this does not seem to be the best approach at this time.  Daryel November

## 2013-11-19 DIAGNOSIS — R579 Shock, unspecified: Secondary | ICD-10-CM

## 2013-11-19 DIAGNOSIS — I2589 Other forms of chronic ischemic heart disease: Secondary | ICD-10-CM

## 2013-11-19 LAB — GLUCOSE, CAPILLARY
GLUCOSE-CAPILLARY: 108 mg/dL — AB (ref 70–99)
GLUCOSE-CAPILLARY: 111 mg/dL — AB (ref 70–99)
GLUCOSE-CAPILLARY: 137 mg/dL — AB (ref 70–99)
Glucose-Capillary: 116 mg/dL — ABNORMAL HIGH (ref 70–99)
Glucose-Capillary: 94 mg/dL (ref 70–99)

## 2013-11-19 LAB — BASIC METABOLIC PANEL
Anion gap: 13 (ref 5–15)
BUN: 52 mg/dL — ABNORMAL HIGH (ref 6–23)
CO2: 22 mEq/L (ref 19–32)
Calcium: 8.2 mg/dL — ABNORMAL LOW (ref 8.4–10.5)
Chloride: 107 mEq/L (ref 96–112)
Creatinine, Ser: 1.59 mg/dL — ABNORMAL HIGH (ref 0.50–1.35)
GFR calc Af Amer: 51 mL/min — ABNORMAL LOW (ref >90)
GFR calc non Af Amer: 44 mL/min — ABNORMAL LOW (ref >90)
GLUCOSE: 109 mg/dL — AB (ref 70–99)
POTASSIUM: 3.7 meq/L (ref 3.7–5.3)
Sodium: 142 mEq/L (ref 137–147)

## 2013-11-19 LAB — CBC
HCT: 45.2 % (ref 39.0–52.0)
HEMOGLOBIN: 14.4 g/dL (ref 13.0–17.0)
MCH: 28.1 pg (ref 26.0–34.0)
MCHC: 31.9 g/dL (ref 30.0–36.0)
MCV: 88.1 fL (ref 78.0–100.0)
PLATELETS: 324 10*3/uL (ref 150–400)
RBC: 5.13 MIL/uL (ref 4.22–5.81)
RDW: 18.1 % — ABNORMAL HIGH (ref 11.5–15.5)
WBC: 19.9 10*3/uL — AB (ref 4.0–10.5)

## 2013-11-19 LAB — VANCOMYCIN, RANDOM: Vancomycin Rm: 13.9 ug/mL

## 2013-11-19 MED ORDER — VANCOMYCIN HCL IN DEXTROSE 1-5 GM/200ML-% IV SOLN
1000.0000 mg | INTRAVENOUS | Status: DC
  Administered 2013-11-19: 1000 mg via INTRAVENOUS
  Filled 2013-11-19: qty 200

## 2013-11-19 NOTE — Progress Notes (Signed)
No significant change in foot.  Toes dusky on right some mild pain. Still on vasopressin levophed Will continue to follow.  Will consider BKA when overall stable and off pressors.  Again do not think this is source of his sepsis.  Ruta Hinds, MD [facility] Office: [phone] Pager: [phone]

## 2013-11-19 NOTE — Progress Notes (Addendum)
PULMONARY / CRITICAL CARE MEDICINE  Name: Fernando Lane MRN: [id number] DOB: 1950-11-13    ADMISSION DATE:  11/15/2013 CONSULTATION DATE:  11/15/2013  REFERRING MD : Oval Linsey EDP  CHIEF COMPLAINT:  SOB  INITIAL PRESENTATION: 63 yo male with R foot gangrene and pseudomembranous colitis presented to [facility] ER with septic shock and acute respiratory failure requiring intubation. Transferred to [facility] for further management.  STUDIES / SIGNIFICANT EVENTS:  5/29 TTE >>> EF 35%, severely dilated RV, RA invagination 9/3   Intubated in [place] ED, transferred to [facility] 9/4   Extubated 9/4   Seen by Vascular 9/4   TTE >>> Limited study, akinetic apex with unusual appearance, cannot rule out distal muscular VSD 9/5   Cardiology consulted  9/07 Weaning vasopressors   INTERVAL HISTORY:  Unable to titrate pressors to off  VITAL SIGNS: Temp:  [97.5 F (36.4 C)-98 F (36.7 C)] 97.6 F (36.4 C) (09/07 1538) Pulse Rate:  [31-106] 89 (09/07 1700) Resp:  [13-22] 15 (09/07 1700) BP: (68-119)/(31-86) 99/60 mmHg (09/07 1700) SpO2:  [63 %-97 %] 92 % (09/07 1700) Arterial Line BP: (64-121)/(48-77) 64/51 mmHg (09/07 1045) Weight:  [117.9 kg (259 lb 14.8 oz)] 117.9 kg (259 lb 14.8 oz) (09/07 0500)  HEMODYNAMICS: CVP:  [14 mmHg-17 mmHg] 16 mmHg  VENTILATOR SETTINGS:   INTAKE / OUTPUT: Intake/Output     09/06 0701 - 09/07 0700 09/07 0701 - 09/08 0700   P.O.     I.V. (mL/kg) 642.8 (5.5) 131.9 (1.1)   IV Piggyback 1150 200   Total Intake(mL/kg) 1792.8 (15.2) 331.9 (2.8)   Urine (mL/kg/hr) 985 (0.3) 345 (0.3)   Total Output 985 345   Net +807.8 -13.1         PHYSICAL EXAMINATION: General:  No distress Neuro:  Awake, alert, cooperative HEENT:  Moist membranes Cardiovascular:  Regular, no murmurs Lungs:  Diminished bilateral air entry Abdomen:  Soft, non-distended Ext: 3+ symmetric edema   LABS: I have reviewed all of today's lab results. Relevant abnormalities are discussed in the A/P  section  CXR: NNF   ASSESSMENT / PLAN:  PULMONARY A: Acute respiratory failure in setting of septic shock - resolved  intubated 9/3, extubated 9/4 COPD without evidence of exacerbation OSA on home CPAP P:   Cont Supplemental oxygen Goal SpO2>92 Cont Incentive spirometry / flutter valve Cont Albuterol PRN CPAP qhs  CARDIOVASCULAR A:  Shock (hypovolemic, septic, cardiogenic) Demand ischemia, less likely NSTEMI SVT on presentation, resolved Chronic systolic heart failure Severe right heart dysfunction  Aneurysmal dilation of heart apex, cannot r/o distal muscular VSD on TTE 9/4 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 CVL placed 9/3 >>  H/o STEMI 08/07/13, LAD and RCA stenosis on cath 08/08/13, medical management recommended H/o Pericardial effusion in setting of STEMI s/p pericardiocentesis 08/10/13, on colchicine as outpatient P:  Cardiology following, input appreciated Goal MAP > 60 mmHg Levophed gtt DC Vasopressin gtt Continue ASA, Plavix, Lipitor  Holding preadmission Coreg, Lisinopril, [place] Heart MRI when stable  RENAL A:   AKI in setting of septic shock, improving Increased anion gap metabolic acidosis, resolved Hypokalemia, resolved Severe hypervolemia P:   Monitor BMET intermittently Monitor I/Os Correct electrolytes as indicated Consider further diuresis when off vasopressors  GASTROINTESTINAL A:   Suspected dysphagia P:   Pepcid D3 diet  HEMATOLOGIC A:   Polycythemia vera DVT Px P:  Trend CBC Heparin [place] Preadmission Anagrelide  INFECTIOUS A:  Pseudomembranous colitis, 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 Relative adrenal insufficiency ruled out ( cortisol 54 on 9/3 ) P:   SSI  NEUROLOGIC A:    Acute metabolic encephalopathy, resolved P:   Fentanyl PRN  I have personally obtained history, examined patient, evaluated and interpreted laboratory and imaging results, reviewed medical records, formulated assessment / plan and placed orders.   Merton Border, MD Pulmonary and [place] Pager: [phone]  11/19/2013, 5:23 PM

## 2013-11-19 NOTE — Evaluation (Signed)
Physical Therapy Evaluation Patient Details Name: Fernando Lane MRN: [id number] DOB: 08-31-1950 Today's Date: 11/19/2013   History of Present Illness    63 yo male with R foot gangrene and pseudomembranous colitis presented to [facility] ER with septic shock and acute respiratory failure requiring intubation. Transferred to [facility] for further management. Plan for right BKA possibly soon.       Clinical Impression  Pt admitted with above. Pt currently with functional limitations due to the deficits listed below (see PT Problem List).  Pt will benefit from skilled PT to increase their independence and safety with mobility to allow discharge to the venue listed below.     Follow Up Recommendations SNF;Supervision/Assistance - 24 hour    Equipment Recommendations  None recommended by PT    Recommendations for Other Services       Precautions / Restrictions Precautions Precautions: Fall Restrictions Weight Bearing Restrictions: No      Mobility  Bed Mobility Overal bed mobility: Needs Assistance;+2 for physical assistance Bed Mobility: Rolling Rolling: Max assist;+2 for physical assistance         General bed mobility comments: needed assist to initiate movement to reach for therapist and then to rail.  Used pads to assist.   Transfers                    Ambulation/Gait                Stairs            Wheelchair Mobility    Modified Rankin (Stroke Patients Only)       Balance Overall balance assessment: Needs assistance;History of Falls Sitting-balance support: Bilateral upper extremity supported;Feet supported Sitting balance-Leahy Scale: Poor Sitting balance - Comments: Pt needed UE support in sitting.  Placed bed in full chair position.Pt sat a total of 15 minutes only resting his back on the bed x2 during sitting.  Definite need of UEs for support in sitting however did well with tolerance of sitting.  Pt stated his right foot hurts too bad to  attempt to stand.   Postural control: Posterior lean                                   Pertinent Vitals/Pain Pain Assessment: Faces Faces Pain Scale: Hurts whole lot Pain Location: right LE pain Pain Descriptors / Indicators: Constant;Heaviness;Sharp;Stabbing Pain Intervention(s): Limited activity within patient's tolerance;Monitored during session;Repositioned BP 97/41 initially.  BP 46/28 after sitting EOB asymptomatic.  Laid back down and BP76/57.  Pt with chronic low BP.  Other VSS.     Home Living Family/patient expects to be discharged to:: Skilled nursing facility                      Prior Function Level of Independence: Needs assistance   Gait / Transfers Assistance Needed: Pt states he was not ambulating at SNF, transfers only  ADL's / Homemaking Assistance Needed: requires assistance with all ADLS since recent admission        Hand Dominance   Dominant Hand: Right    Extremity/Trunk Assessment   Upper Extremity Assessment: Defer to OT evaluation           Lower Extremity Assessment: RLE deficits/detail;LLE deficits/detail RLE Deficits / Details: grossly 2+/5 LLE Deficits / Details: grossly 3/5     Communication   Communication: No difficulties  Cognition Arousal/Alertness: Awake/alert Behavior  During Therapy: WFL for tasks assessed/performed Overall Cognitive Status: Within Functional Limits for tasks assessed                      General Comments General comments (skin integrity, edema, etc.): has a bed sore on his bottom.    Exercises General Exercises - Lower Extremity Ankle Circles/Pumps: AROM;Both;5 reps;Seated Long Arc Quad: AROM;Both;5 reps;Seated      Assessment/Plan    PT Assessment Patient needs continued PT services  PT Diagnosis Generalized weakness;Acute pain   PT Problem List Decreased activity tolerance;Decreased balance;Decreased mobility;Decreased coordination;Decreased knowledge of use of  DME;Decreased safety awareness;Decreased knowledge of precautions;Pain;Decreased strength  PT Treatment Interventions DME instruction;Gait training;Functional mobility training;Therapeutic activities;Therapeutic exercise;Balance training;Patient/family education   PT Goals (Current goals can be found in the Care Plan section) Acute Rehab PT Goals Patient Stated Goal: To go to NH PT Goal Formulation: With patient Time For Goal Achievement: 12/03/13 Potential to Achieve Goals: Fair    Frequency Min 2X/week   Barriers to discharge Decreased caregiver support      Co-evaluation               End of Session Equipment Utilized During Treatment: Gait belt;Oxygen Activity Tolerance: Patient limited by fatigue;Patient limited by pain Patient left: in bed;with call bell/phone within reach;with family/visitor present (in chair position) Nurse Communication: Mobility status;Need for lift equipment         Time: [phone] PT Time Calculation (min): 28 min   Charges:   PT Evaluation $Initial PT Evaluation Tier I: 1 Procedure PT Treatments $Therapeutic Activity: 8-22 mins   PT G Codes:          INGOLD,Arron Tetrault Dec 05, 2013, 1:19 PM Kandie Keiper [place],PT Acute Rehabilitation [phone] (pager)

## 2013-11-19 NOTE — Care Management Note (Addendum)
  Page 2 of 2   12/03/2013     5:13:26 PM CARE MANAGEMENT NOTE 12/03/2013  Patient:  Fernando Lane, Fernando Lane   Account Number:  [id number]  Date Initiated:  11/19/2013  Documentation initiated by:  [facility]  Subjective/Objective Assessment:   Admitted with sepsis - resp failure intubated     Action/Plan:   CM to follow for disposition needs   Anticipated DC Date:  11/23/2013   Anticipated DC Plan:  SKILLED NURSING FACILITY  In-house referral  Clinical Social Worker      DC Planning Services  CM consult      Choice offered to / List presented to:             Status of service:  Completed, signed off Medicare Important Message given?  YES (If response is "NO", the following Medicare IM given date fields will be blank) Date Medicare IM given:  11/26/2013 Medicare IM given by:  [facility], [place] Date Additional Medicare IM given:  12/03/2013 Additional Medicare IM given by:  Alisson Rozell  Discharge Disposition:  [place]  Per UR Regulation:  Reviewed for med. 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  11/22/13 [place] MSN BSN CCM Xferred to [place] on lasix gtt.  Vascular surgery waiting for cardiac clearance before (R) 4th toe amp.  11-19-13 1:30pm Luz Lex, [place] From SNF for rehab - states applying for Medicaid.  Plan for SNF for continued rehab.

## 2013-11-19 NOTE — Progress Notes (Signed)
Placed pt. On cpap. Pt. Tolerating well at this time. 

## 2013-11-19 NOTE — Progress Notes (Signed)
Subjective: Denies CP  Breathing is OK  Complains of pain in foot   Objective: Filed Vitals:   11/19/13 0700 11/19/13 0800 11/19/13 0824 11/19/13 0900  BP: 112/69 106/60  99/47  Pulse:  65    Temp:   97.8 F (36.6 C)   TempSrc:   Oral   Resp: 19 18  22   Height:      Weight:      SpO2:  68%     Weight change: 5 lb 4.7 oz (2.4 kg)  Intake/Output Summary (Last 24 hours) at 11/19/13 1033 Last data filed at 11/19/13 0902  Gross per 24 hour  Intake  958.4 ml  Output    890 ml  Net   68.4 ml    General: Alert, awake, oriented x3, in no acute distress Neck:  JVP is normal Heart: Regular rate and rhythm, without murmurs, rubs, gallops.  Lungs: Clear to auscultation.  No rales or wheezes. Exemities:  Tr edema.  Did not look at feet.   Neuro: Grossly intact, nonfocal.   Lab Results: Results for orders placed during the hospital encounter of 11/15/13 (from the past 24 hour(s))  GLUCOSE, CAPILLARY     Status: Abnormal   Collection Time    11/18/13 11:31 AM      Result Value Ref Range   Glucose-Capillary 116 (*) 70 - 99 mg/dL  GLUCOSE, CAPILLARY     Status: Abnormal   Collection Time    11/18/13  4:35 PM      Result Value Ref Range   Glucose-Capillary 137 (*) 70 - 99 mg/dL  GLUCOSE, CAPILLARY     Status: Abnormal   Collection Time    11/18/13  7:38 PM      Result Value Ref Range   Glucose-Capillary 138 (*) 70 - 99 mg/dL   Comment 1 Notify RN    GLUCOSE, CAPILLARY     Status: Abnormal   Collection Time    11/19/13 12:14 AM      Result Value Ref Range   Glucose-Capillary 111 (*) 70 - 99 mg/dL  GLUCOSE, CAPILLARY     Status: Abnormal   Collection Time    11/19/13  3:56 AM      Result Value Ref Range   Glucose-Capillary 108 (*) 70 - 99 mg/dL   Comment 1 Notify RN    CBC     Status: Abnormal   Collection Time    11/19/13  5:00 AM      Result Value Ref Range   WBC 19.9 (*) 4.0 - 10.5 K/uL   RBC 5.13  4.22 - 5.81 MIL/uL   Hemoglobin 14.4  13.0 - 17.0 g/dL   HCT  45.2  39.0 - 52.0 %   MCV 88.1  78.0 - 100.0 fL   MCH 28.1  26.0 - 34.0 pg   MCHC 31.9  30.0 - 36.0 g/dL   RDW 18.1 (*) 11.5 - 15.5 %   Platelets 324  150 - 400 K/uL  BASIC METABOLIC PANEL     Status: Abnormal   Collection Time    11/19/13  5:00 AM      Result Value Ref Range   Sodium 142  137 - 147 mEq/L   Potassium 3.7  3.7 - 5.3 mEq/L   Chloride 107  96 - 112 mEq/L   CO2 22  19 - 32 mEq/L   Glucose, Bld 109 (*) 70 - 99 mg/dL   BUN 52 (*) 6 - 23 mg/dL  Creatinine, Ser 1.59 (*) 0.50 - 1.35 mg/dL   Calcium 8.2 (*) 8.4 - 10.5 mg/dL   GFR calc non Af Amer 44 (*) >90 mL/min   GFR calc Af Amer 51 (*) >90 mL/min   Anion gap 13  5 - 15  VANCOMYCIN, RANDOM     Status: None   Collection Time    11/19/13  5:00 AM      Result Value Ref Range   Vancomycin Rm 13.9    GLUCOSE, CAPILLARY     Status: Abnormal   Collection Time    11/19/13  8:23 AM      Result Value Ref Range   Glucose-Capillary 116 (*) 70 - 99 mg/dL    Studies/Results: No results found.  Medications: REviewed   @PROBHOSP @  LOS: 4 days   1.  CAD   I have reviewed cath from May 2015.  Patient with occlusion of LAD and RCA No intervention possible. 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 Would treat for systolic CHF when BP improves. Concern in future will be thrombi at apex, will need to weigh against risk for bleeding Volume status is not bad.   Will continue to follow.   Dorris Carnes 11/19/2013, 10:33 AM

## 2013-11-19 NOTE — Progress Notes (Addendum)
ANTIBIOTIC CONSULT NOTE  Pharmacy Consult:  Fidaxomicin, Vancomycin, Zosyn Indication:  Recent C.diff (11/12/13)   No Known Allergies  Patient Measurements: Height: 5\' 6"  (167.6 cm) Weight: 259 lb 14.8 oz (117.9 kg) IBW/kg (Calculated) : 63.8  Vital Signs: Temp: 97.6 F (36.4 C) (09/07 0433) Temp src: Oral (09/07 0433) BP: 119/77 mmHg (09/07 0600) Pulse Rate: 103 (09/07 0600)  Labs:  Recent Labs  11/17/13 0424 11/18/13 0403 11/19/13 0500  WBC 24.6* 23.5* 19.9*  HGB 14.1 13.9 14.4  PLT 457* 413* 324  CREATININE 2.51* 1.97* 1.59*    Medical History: Past Medical History  Diagnosis Date  . Chronic low back pain   . 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 Chronic neck pain   . OSA (obstructive sleep apnea)     CPAP @ bedtime  . Allergy   . Unspecified essential hypertension     Resistant, 2D Echo - EF-55-60  . Polycythemia   . CAD (coronary artery disease)     Vessel type unspecified  . PVD (peripheral vascular disease)   . DDD (degenerative disc disease), cervical    Assessment: 63 yo male transferred from outside hospital with severe respiratory distress and sepsis.  His leukocytosis has improved from 44K>>19.9K, he is afebrile and his culture data is un-revealing.  Acute renal failure is improving, SCr 2.5 > 1.59, UOP 0.4, Vancomycin random returned this morning slightly subtherapeutic at 13.9.   Vanc 9/3 >> Zosyn 9/3 >> Fidaxomicin 8/31 >> 9/3 - Restart 9/4 >> Flagyl 9/3 >> 9/4 PO Vanc 9/3 >> 9/4  Blood x 1 9/3>>NGTD Urine 9/3>>minimal growth Trach Asp 9/3> no predominant organism Blood x 1 9/4>> ngtd 9/3 MRSA: pos  Goal of Therapy:  Vancomycin trough level 15-20 mcg/ml  Plan:   - Increase vancomycin to 1 g IV q24h - Zosyn 3.375gm IV Q8H, 4 hr infusion - Fidaxomicin 200mg  bid x 10 day course - Monitor renal fxn, clinical course, recommend VT at steady state   Hughes Better, PharmD, BCPS Clinical  Pharmacist Pager: [phone] 11/19/2013 8:20 AM     Pharmacist Heart Failure Core Measure Documentation  Assessment: KHRYSTIAN SCHAUF has an EF documented as 35-40% on 08/10/13.  Rationale: Heart failure patients with left ventricular systolic dysfunction (LVSD) and an EF < 40% should be prescribed an angiotensin converting enzyme inhibitor (ACEI) or angiotensin receptor blocker (ARB) at discharge unless a contraindication is documented in the medical record.  This patient is not currently on an ACEI or ARB for HF.  This note is being placed in the record in order to provide documentation that a contraindication to the use of these agents is present for this encounter.  ACE Inhibitor or Angiotensin Receptor Blocker is contraindicated (specify all that apply)  []   ACEI allergy AND ARB allergy []   Angioedema []   Moderate or severe aortic stenosis []   Hyperkalemia [x]   Hypotension []   Renal artery stenosis []   Worsening renal function, preexisting renal disease or dysfunction   Harvel Quale 11/19/2013 2:02 PM

## 2013-11-20 ENCOUNTER — Inpatient Hospital Stay (HOSPITAL_COMMUNITY): Payer: Medicare Other

## 2013-11-20 DIAGNOSIS — I519 Heart disease, unspecified: Secondary | ICD-10-CM

## 2013-11-20 DIAGNOSIS — E8770 Fluid overload, unspecified: Secondary | ICD-10-CM

## 2013-11-20 DIAGNOSIS — I50810 Right heart failure, unspecified: Secondary | ICD-10-CM

## 2013-11-20 LAB — PROCALCITONIN: PROCALCITONIN: 0.25 ng/mL

## 2013-11-20 LAB — COMPREHENSIVE METABOLIC PANEL
ALT: 9 U/L (ref 0–53)
AST: 8 U/L (ref 0–37)
Albumin: 2.4 g/dL — ABNORMAL LOW (ref 3.5–5.2)
Alkaline Phosphatase: 102 U/L (ref 39–117)
Anion gap: 11 (ref 5–15)
BUN: 47 mg/dL — ABNORMAL HIGH (ref 6–23)
CO2: 24 mEq/L (ref 19–32)
CREATININE: 1.24 mg/dL (ref 0.50–1.35)
Calcium: 8.2 mg/dL — ABNORMAL LOW (ref 8.4–10.5)
Chloride: 109 mEq/L (ref 96–112)
GFR calc non Af Amer: 60 mL/min — ABNORMAL LOW (ref >90)
GFR, EST AFRICAN AMERICAN: 69 mL/min — AB (ref >90)
Glucose, Bld: 86 mg/dL (ref 70–99)
Potassium: 3.4 mEq/L — ABNORMAL LOW (ref 3.7–5.3)
SODIUM: 144 meq/L (ref 137–147)
TOTAL PROTEIN: 5 g/dL — AB (ref 6.0–8.3)
Total Bilirubin: 0.6 mg/dL (ref 0.3–1.2)

## 2013-11-20 LAB — CBC
HCT: 44.2 % (ref 39.0–52.0)
Hemoglobin: 14 g/dL (ref 13.0–17.0)
MCH: 28.5 pg (ref 26.0–34.0)
MCHC: 31.7 g/dL (ref 30.0–36.0)
MCV: 89.8 fL (ref 78.0–100.0)
PLATELETS: 199 10*3/uL (ref 150–400)
RBC: 4.92 MIL/uL (ref 4.22–5.81)
RDW: 18.2 % — ABNORMAL HIGH (ref 11.5–15.5)
WBC: 13.8 10*3/uL — ABNORMAL HIGH (ref 4.0–10.5)

## 2013-11-20 LAB — GLUCOSE, CAPILLARY
GLUCOSE-CAPILLARY: 97 mg/dL (ref 70–99)
GLUCOSE-CAPILLARY: 83 mg/dL (ref 70–99)
Glucose-Capillary: 68 mg/dL — ABNORMAL LOW (ref 70–99)
Glucose-Capillary: 91 mg/dL (ref 70–99)

## 2013-11-20 MED ORDER — FENTANYL CITRATE 0.05 MG/ML IJ SOLN
25.0000 ug | INTRAMUSCULAR | Status: DC | PRN
  Administered 2013-11-20 – 2013-12-04 (×13): 50 ug via INTRAVENOUS
  Filled 2013-11-20 (×15): qty 2

## 2013-11-20 MED ORDER — POTASSIUM CHLORIDE CRYS ER 20 MEQ PO TBCR
20.0000 meq | EXTENDED_RELEASE_TABLET | ORAL | Status: AC
  Administered 2013-11-20 (×2): 20 meq via ORAL
  Filled 2013-11-20 (×2): qty 1

## 2013-11-20 MED ORDER — LIDOCAINE HCL 2 % EX GEL
1.0000 "application " | Freq: Once | CUTANEOUS | Status: AC | PRN
  Administered 2013-11-20: 1 via TOPICAL
  Filled 2013-11-20: qty 5

## 2013-11-20 MED ORDER — ENOXAPARIN SODIUM 40 MG/0.4ML ~~LOC~~ SOLN
40.0000 mg | Freq: Every day | SUBCUTANEOUS | Status: DC
  Administered 2013-11-20: 40 mg via SUBCUTANEOUS
  Filled 2013-11-20 (×2): qty 0.4

## 2013-11-20 MED ORDER — FUROSEMIDE 10 MG/ML IJ SOLN
40.0000 mg | Freq: Two times a day (BID) | INTRAMUSCULAR | Status: DC
  Administered 2013-11-20: 40 mg via INTRAVENOUS
  Filled 2013-11-20: qty 4

## 2013-11-20 MED ORDER — POTASSIUM CHLORIDE CRYS ER 20 MEQ PO TBCR
40.0000 meq | EXTENDED_RELEASE_TABLET | Freq: Three times a day (TID) | ORAL | Status: DC
  Administered 2013-11-20: 40 meq via ORAL
  Filled 2013-11-20: qty 2

## 2013-11-20 MED ORDER — FAMOTIDINE 20 MG PO TABS
20.0000 mg | ORAL_TABLET | Freq: Every day | ORAL | Status: DC
  Filled 2013-11-20: qty 1

## 2013-11-20 MED ORDER — FUROSEMIDE 10 MG/ML IJ SOLN
15.0000 mg/h | INTRAVENOUS | Status: DC
  Administered 2013-11-21 – 2013-11-22 (×3): 15 mg/h via INTRAVENOUS
  Filled 2013-11-20 (×10): qty 25

## 2013-11-20 MED ORDER — ALBUTEROL SULFATE (2.5 MG/3ML) 0.083% IN NEBU: 2.5000 mg | INHALATION_SOLUTION | RESPIRATORY_TRACT | Status: DC | PRN

## 2013-11-20 NOTE — Progress Notes (Signed)
DAILY PROGRESS NOTE  Subjective:  Weaned off of pressors. BP will probably support diuresis.  Per vascular surgery, toe ischemia is improving somewhat - not clear that amputation will be needed in the near future.  Objective:  Temp:  [97.5 F (36.4 C)-97.8 F (36.6 C)] 97.8 F (36.6 C) (09/08 0818) Pulse Rate:  [80-103] 94 (09/08 1030) Resp:  [11-23] 17 (09/08 1032) BP: (72-158)/(40-95) 98/60 mmHg (09/08 1032) SpO2:  [83 %-100 %] 96 % (09/08 1030) Weight:  [260 lb 12.9 oz (118.3 kg)] 260 lb 12.9 oz (118.3 kg) (09/08 0500) Weight change: 14.1 oz (0.4 kg)  Intake/Output from previous day: 09/07 0701 - 09/08 0700 In: 571.9 [I.V.:271.9; IV Piggyback:300] Out: 1010 [Urine:1010]  Intake/Output from this shift: Total I/O In: 130 [P.O.:100; I.V.:30] Out: 10 [Urine:10]  Medications: Current Facility-Administered Medications  Medication Dose Route Frequency Provider Last Rate Last Dose  . 0.9 %  sodium chloride infusion  250 mL Intravenous PRN Corey Harold, NP 10 mL/hr at 11/18/13 1000 250 mL at 11/18/13 1000  . albuterol (PROVENTIL) (2.5 MG/3ML) 0.083% nebulizer solution 2.5 mg  2.5 mg Nebulization Q4H PRN Wilhelmina Mcardle, MD      . aspirin tablet 325 mg  325 mg Oral Daily Doree Fudge, MD   325 mg at 11/20/13 0929  . atorvastatin (LIPITOR) tablet 80 mg  80 mg Oral q1800 Doree Fudge, MD   80 mg at 11/19/13 1831  . Chlorhexidine Gluconate Cloth 2 % PADS 6 each  6 each Topical Q0600 Rush Farmer, MD   6 each at 11/20/13 0600  . docusate sodium (COLACE) capsule 100 mg  100 mg Oral Daily PRN Melvenia Needles, NP   100 mg at 11/18/13 0545  . enoxaparin (LOVENOX) injection 40 mg  40 mg Subcutaneous Daily Wilhelmina Mcardle, MD   40 mg at 11/20/13 1225  . famotidine (PEPCID) IVPB 20 mg  20 mg Intravenous QHS Chesley Mires, MD   20 mg at 11/19/13 2140  . fentaNYL (SUBLIMAZE) injection 25-50 mcg  25-50 mcg Intravenous Q2H PRN Wilhelmina Mcardle, MD   50 mcg at 11/20/13  1018  . fidaxomicin (DIFICID) tablet 200 mg  200 mg Oral BID Tomasita Morrow, RPH   200 mg at 11/20/13 7619  . food thickener (THICK IT) powder   Oral PRN Doree Fudge, MD      . furosemide (LASIX) injection 40 mg  40 mg Intravenous BID Wilhelmina Mcardle, MD   40 mg at 11/20/13 1225  . mupirocin ointment (BACTROBAN) 2 % 1 application  1 application Nasal BID Rush Farmer, MD   1 application at 50/93/26 0930  . potassium chloride SA (K-DUR,KLOR-CON) CR tablet 40 mEq  40 mEq Oral TID Wilhelmina Mcardle, MD   40 mEq at 11/20/13 1224    Physical Exam: General appearance: alert Lungs: diminished breath sounds bilaterally Heart: regular rate and rhythm, S1, S2 normal, no murmur, click, rub or gallop Extremities: edema 2+ edema, dependent rubor, 1+ pulses  Lab Results: Results for orders placed during the hospital encounter of 11/15/13 (from the past 48 hour(s))  GLUCOSE, CAPILLARY     Status: Abnormal   Collection Time    11/18/13  4:35 PM      Result Value Ref Range   Glucose-Capillary 137 (*) 70 - 99 mg/dL  GLUCOSE, CAPILLARY     Status: Abnormal   Collection Time    11/18/13  7:38 PM  Result Value Ref Range   Glucose-Capillary 138 (*) 70 - 99 mg/dL   Comment 1 Notify RN    GLUCOSE, CAPILLARY     Status: Abnormal   Collection Time    11/19/13 12:14 AM      Result Value Ref Range   Glucose-Capillary 111 (*) 70 - 99 mg/dL  GLUCOSE, CAPILLARY     Status: Abnormal   Collection Time    11/19/13  3:56 AM      Result Value Ref Range   Glucose-Capillary 108 (*) 70 - 99 mg/dL   Comment 1 Notify RN    CBC     Status: Abnormal   Collection Time    11/19/13  5:00 AM      Result Value Ref Range   WBC 19.9 (*) 4.0 - 10.5 K/uL   RBC 5.13  4.22 - 5.81 MIL/uL   Hemoglobin 14.4  13.0 - 17.0 g/dL   HCT 45.2  39.0 - 52.0 %   MCV 88.1  78.0 - 100.0 fL   MCH 28.1  26.0 - 34.0 pg   MCHC 31.9  30.0 - 36.0 g/dL   RDW 18.1 (*) 11.5 - 15.5 %   Platelets 324  150 - 400 K/uL  BASIC  METABOLIC PANEL     Status: Abnormal   Collection Time    11/19/13  5:00 AM      Result Value Ref Range   Sodium 142  137 - 147 mEq/L   Potassium 3.7  3.7 - 5.3 mEq/L   Chloride 107  96 - 112 mEq/L   CO2 22  19 - 32 mEq/L   Glucose, Bld 109 (*) 70 - 99 mg/dL   BUN 52 (*) 6 - 23 mg/dL   Creatinine, Ser 1.59 (*) 0.50 - 1.35 mg/dL   Calcium 8.2 (*) 8.4 - 10.5 mg/dL   GFR calc non Af Amer 44 (*) >90 mL/min   GFR calc Af Amer 51 (*) >90 mL/min   Comment: (NOTE)     The eGFR has been calculated using the CKD EPI equation.     This calculation has not been validated in all clinical situations.     eGFR's persistently <90 mL/min signify possible Chronic Kidney     Disease.   Anion gap 13  5 - 15  VANCOMYCIN, RANDOM     Status: None   Collection Time    11/19/13  5:00 AM      Result Value Ref Range   Vancomycin Rm 13.9     Comment:            Random Vancomycin therapeutic     range is dependent on dosage and     time of specimen collection.     A peak range is 20.0-40.0 ug/mL     A trough range is 5.0-15.0 ug/mL             GLUCOSE, CAPILLARY     Status: Abnormal   Collection Time    11/19/13  8:23 AM      Result Value Ref Range   Glucose-Capillary 116 (*) 70 - 99 mg/dL  GLUCOSE, CAPILLARY     Status: Abnormal   Collection Time    11/19/13 12:29 PM      Result Value Ref Range   Glucose-Capillary 137 (*) 70 - 99 mg/dL  GLUCOSE, CAPILLARY     Status: None   Collection Time    11/19/13  4:10 PM      Result  Value Ref Range   Glucose-Capillary 94  70 - 99 mg/dL  CBC     Status: Abnormal   Collection Time    11/20/13  5:00 AM      Result Value Ref Range   WBC 13.8 (*) 4.0 - 10.5 K/uL   RBC 4.92  4.22 - 5.81 MIL/uL   Hemoglobin 14.0  13.0 - 17.0 g/dL   HCT 44.2  39.0 - 52.0 %   MCV 89.8  78.0 - 100.0 fL   MCH 28.5  26.0 - 34.0 pg   MCHC 31.7  30.0 - 36.0 g/dL   RDW 18.2 (*) 11.5 - 15.5 %   Platelets 199  150 - 400 K/uL   Comment: REPEATED TO VERIFY  COMPREHENSIVE  METABOLIC PANEL     Status: Abnormal   Collection Time    11/20/13  5:00 AM      Result Value Ref Range   Sodium 144  137 - 147 mEq/L   Potassium 3.4 (*) 3.7 - 5.3 mEq/L   Chloride 109  96 - 112 mEq/L   CO2 24  19 - 32 mEq/L   Glucose, Bld 86  70 - 99 mg/dL   BUN 47 (*) 6 - 23 mg/dL   Creatinine, Ser 1.24  0.50 - 1.35 mg/dL   Calcium 8.2 (*) 8.4 - 10.5 mg/dL   Total Protein 5.0 (*) 6.0 - 8.3 g/dL   Albumin 2.4 (*) 3.5 - 5.2 g/dL   AST 8  0 - 37 U/L   ALT 9  0 - 53 U/L   Alkaline Phosphatase 102  39 - 117 U/L   Total Bilirubin 0.6  0.3 - 1.2 mg/dL   GFR calc non Af Amer 60 (*) >90 mL/min   GFR calc Af Amer 69 (*) >90 mL/min   Comment: (NOTE)     The eGFR has been calculated using the CKD EPI equation.     This calculation has not been validated in all clinical situations.     eGFR's persistently <90 mL/min signify possible Chronic Kidney     Disease.   Anion gap 11  5 - 15  PROCALCITONIN     Status: None   Collection Time    11/20/13  5:00 AM      Result Value Ref Range   Procalcitonin 0.25     Comment:            Interpretation:     PCT (Procalcitonin) <= 0.5 ng/mL:     Systemic infection (sepsis) is not likely.     Local bacterial infection is possible.     (NOTE)             ICU PCT Algorithm               Non ICU PCT Algorithm        ----------------------------     ------------------------------             PCT < 0.25 ng/mL                 PCT < 0.1 ng/mL         Stopping of antibiotics            Stopping of antibiotics           strongly encouraged.               strongly encouraged.        ----------------------------     ------------------------------  PCT level decrease by               PCT < 0.25 ng/mL           >= 80% from peak PCT           OR PCT 0.25 - 0.5 ng/mL          Stopping of antibiotics                                                 encouraged.         Stopping of antibiotics               encouraged.        ----------------------------      ------------------------------           PCT level decrease by              PCT >= 0.25 ng/mL           < 80% from peak PCT            AND PCT >= 0.5 ng/mL            Continuing antibiotics                                                  encouraged.           Continuing antibiotics                encouraged.        ----------------------------     ------------------------------         PCT level increase compared          PCT > 0.5 ng/mL             with peak PCT AND              PCT >= 0.5 ng/mL             Escalation of antibiotics                                              strongly encouraged.          Escalation of antibiotics            strongly encouraged.    Imaging: Imaging results have been reviewed  Assessment:  1. Active Problems: 2.   CORONARY ARTERY DISEASE, VESSEL TYPE UNSPECIFIED 3.   Atherosclerosis of native arteries of the extremities with ulceration(440.23) 4.   Septic shock 5.   Acute respiratory failure 6.   Pneumonia 7.   C. difficile colitis 8.   AKI (acute kidney injury) 9.   Dry gangrene 10.   Right ventricular dysfunction 11.   Cardiomyopathy, ischemic 12.   Shock circulatory 13.   Plan:  1. Agree with diuresis at this point. Do not suspect VSD, however, persistent RV infarction is possibility. Cardiac MRI may be helpful in showing increased signal with delayed enhancement. Right heart catheterization may be of benefit, especially if significant pulmonary hypertension is suspected - would  recommend diuresis first to lower filling pressures and then can get a more accurate measurement of PA pressure, possibly in a few days. If there is severe pulmonary hypertension, there may be a role for long-term anticoagulation. He does not currently meet criteria given LV systolic dysfunction or even degree of PAD, however, it is thought that he may derive antithrombotic benefit from anticoagulation - if there was RV thrombus on cMRI that would be an  indication for anticoagulation. I don't believe there are any significant studies regarding long-term anticoagulation in the setting of chronic RV dysfunction.   Time Spent Directly with Patient:  15 minutes  Length of Stay:  LOS: 5 days   Pixie Casino, MD, [facility] Attending Cardiologist [facility]  HILTY,Kenneth C 11/20/2013, 12:29 PM

## 2013-11-20 NOTE — Consult Note (Signed)
Urology Attending note: Pt seen and examined. PA note reviewed.  Pt has anasarca from R heart failure, with penile deformity 2nd ary edema, with increased scrotal edema and thigh tightness. He is a poor candidate for suprapubic catheter at this point because he would require anesthesia for placement ) contra-indicated with cardiac status).   He now has scrotal elevation on a towel,and foley taped to his upper thigh, so he will not get erosion and iatrogenic peno-scrotal junction urethral stricture.    Recommend leaving foley catheter for now, until fluid can be drained from his abdomen and extremities. Please re-consult as needed.

## 2013-11-20 NOTE — Plan of Care (Signed)
Problem: Phase I Progression Outcomes Goal: Voiding-avoid urinary catheter unless indicated Outcome: Not Progressing Needs foley due to low UO, bladder scan revealed only 30cc since 6am. Foley not draining, attempt to irrigate not successful, will replace foley per order,

## 2013-11-20 NOTE — Progress Notes (Signed)
Speech Language Pathology Treatment: Dysphagia  Patient Details Name: Fernando Lane MRN: [id number] DOB: June 26, 1950 Today's Date: 11/20/2013 Time: 4462-8638 SLP Time Calculation (min): 15 min  Assessment / Plan / Recommendation Clinical Impression  F/u after 9/4 bedside swallow assessment (order discontinued 9/7, re-ordered 9/8).  Continue treatment per initial eval (repeat eval not warranted). Pt continues to present with signs of impaired airway protection when swallowing thin liquids - wet phonation and cough.  Postural adjustments at bedside do not alleviate signs.  Smaller boluses in isolation (without solid foods) were tolerated with reduced coughing.  For now, recommend continuing Dysphagia 3 with nectar-thick liquids; allow small sips of water between meals.  Pt may need FEES to elucidate source of dysphagia; consider vocal fold impairment as etiology given quality of phonation.   Pt agrees to plan.     HPI HPI: 63 year old male with PMH:  polycythemia vera, OSA (nocturnal CPAP), CHF, and CAD from SNF aditted with SOB. Intubated 9/3-9/4. CXR stable bibasilar opacities and layering effusions. Low lung volumes. Cardiomegaly, vascular congestion.  Found to have hypovolemia, sepsis, cardiogenic.       SLP Plan  Continue with current plan of care    Recommendations Diet recommendations: Dysphagia 3 (mechanical soft);Nectar-thick liquid (allow water (thin) between meals) Liquids provided via: Cup Medication Administration: Whole meds with puree Supervision: Patient able to self feed;Intermittent supervision to cue for compensatory strategies Compensations: Slow rate;Small sips/bites Postural Changes and/or Swallow Maneuvers: Seated upright 90 degrees              Oral Care Recommendations: Oral care BID Follow up Recommendations:  (tba) Plan: Continue with current plan of care   Mark Benecke L. Tivis Ringer, [place] CCC/SLP Pager [phone]      Fernando Lane 11/20/2013, 3:41 PM

## 2013-11-20 NOTE — Progress Notes (Signed)
Dr Alva Garnet notified of low BP, Pt is alert and oriented, voiding well via newly placed foley, asymptomatic, instructed to watch pt at this time, CCM will follow up. I suspect BP is not accurate.

## 2013-11-20 NOTE — Progress Notes (Addendum)
Progress Note    11/20/2013 9:08 AM Hospital Day 5  Subjective:  No change in how his feet feel from previous days  Afebrile  Off levophed and vasopressin  Filed Vitals:   11/20/13 0818  BP:   Pulse:   Temp: 97.8 F (36.6 C)  Resp:     Physical Exam:  Lungs:  Non labored this am Extremities:  Right foot duskiness improved and more rubor today.Marland Kitchen  He is still with dry gangrene of 4th right toe and this is unchanged.  Left foot still with some mottling that is unchanged, however, the left foot is also more rubor today.  CBC    Component Value Date/Time   WBC 13.8* 11/20/2013 0500   WBC 12.9* 04/04/2013 1125   RBC 4.92 11/20/2013 0500   RBC 5.23 04/04/2013 1125   HGB 14.0 11/20/2013 0500   HGB 15.1 04/04/2013 1125   HCT 44.2 11/20/2013 0500   HCT 46.9 04/04/2013 1125   PLT 199 11/20/2013 0500   PLT 459* 04/04/2013 1125   MCV 89.8 11/20/2013 0500   MCV 89.7 04/04/2013 1125   MCH 28.5 11/20/2013 0500   MCH 28.9 04/04/2013 1125   MCHC 31.7 11/20/2013 0500   MCHC 32.2 04/04/2013 1125   RDW 18.2* 11/20/2013 0500   RDW 16.1* 04/04/2013 1125   LYMPHSABS 0.7 11/17/2013 0424   LYMPHSABS 1.5 04/04/2013 1125   MONOABS 0.5 11/17/2013 0424   MONOABS 0.2 04/04/2013 1125   EOSABS 0.0 11/17/2013 0424   EOSABS 0.8* 04/04/2013 1125   BASOSABS 0.0 11/17/2013 0424   BASOSABS 0.0 04/04/2013 1125    BMET    Component Value Date/Time   NA 144 11/20/2013 0500   NA 136 03/02/2013 1055   K 3.4* 11/20/2013 0500   K 4.2 03/02/2013 1055   CL 109 11/20/2013 0500   CL 108* 08/04/2012 1302   CO2 24 11/20/2013 0500   CO2 22 03/02/2013 1055   GLUCOSE 86 11/20/2013 0500   GLUCOSE 111 03/02/2013 1055   GLUCOSE 164* 08/04/2012 1302   BUN 47* 11/20/2013 0500   BUN 13.8 03/02/2013 1055   CREATININE 1.24 11/20/2013 0500   CREATININE 0.7 03/02/2013 1055   CALCIUM 8.2* 11/20/2013 0500   CALCIUM 9.7 03/02/2013 1055   GFRNONAA 60* 11/20/2013 0500   GFRAA 69* 11/20/2013 0500    INR    Component Value Date/Time   INR 1.59* 11/15/2013 1950      Intake/Output Summary (Last 24 hours) at 11/20/13 0908 Last data filed at 11/20/13 0600  Gross per 24 hour  Intake  329.9 ml  Output    985 ml  Net -655.1 ml     Assessment/Plan:  63 y.o. 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  Duskiness of both feet have improved and now with more rubor.  Left foot still has some mottling, but is unchanged from yesterday. -continue to improve cardiac status before any amputation will be considered. -per cardiology, he has severe LV and RV dysfunction.  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  There is concern in the future for thrombi at apex and will need to weigh against risk of bleeding for anticoagulation. -sepsis most likely not related to gangrenous toe.   Leontine Locket, PA-C [facility] [phone] 11/20/2013 9:08 AM    Pt hypotensive again this afternoon.  Right fourth toe dry gangrene.  Left foot cool but  no pain.  His hands are cool as well.  Overall major problem is lack of cardiac function for perfusion.  Discussed with pt and his son that we would do right fourth toe amp for pain control after he is more stable and out of ICU.  Potentially early next week.  Bilateral lower extremity edema will place both legs in ACE wraps.      Ruta Hinds, MD [facility] [place] Office: [phone] Pager: [phone]

## 2013-11-20 NOTE — Consult Note (Signed)
Urology Consult   Physician requesting consult: Simonds  Reason for consult: Penile/scrotal edema with foley   History of Present Illness: Fernando Lane is a 63 y.o. male with PMH significant for polycythemia vera, OSA, HTN, CAD s/p MI, PVD with toe gangrene, ischemic cardiomyopathy, RVF,  and pseudomembranous colitis.  He presented to [facility] ER 11/15/13 with shock and acute respiratory failure requiring intubation. He was then transferred to [facility] on 11/15/13 for further management.  He was extubated on 11/16/13 but remains in ICU.    Urology consult was requested due to penile and scrotal edema.  Pt has functioning foley in place.    He states he initially had a foley placed approx 1 month ago because "he was holding to much fluid" and was being diuresed.  That foley was removed approx 1 week later and he states he was able to void without difficulty.  He then had another foley placed "because of fluid" several days later which was again pulled after approx 1 week.  He saw a urologist in [place] and had a void trial which he passed and was again voiding.  At some point last week he had a catheter placed at his SNF and this has been in place until it was exchanged in the ICU today.  He states prior to any foley placements he was voiding without difficulty and felt as though he was emptying his bladder with each void. He thinks the penile/scrotal edema has been present since the second catheter was placed. He states he is uncircumcised.    He denies a history of voiding or storage urinary symptoms, hematuria, UTIs, STDs, urolithiasis, GU malignancy/trauma/surgery.  He is currently resting and is without complaint.  He denies HA, CP, N/V, and diarrhea/constipation.  He does have some SOB.     Past Medical History  Diagnosis Date  . Chronic low back pain   . 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 Chronic neck pain   . OSA (obstructive sleep apnea)    CPAP @ bedtime  . Allergy   . Unspecified essential hypertension     Resistant, 2D Echo - [id number]  . Polycythemia   . CAD (coronary artery disease)     Vessel type unspecified  . PVD (peripheral vascular disease)   . DDD (degenerative disc disease), cervical     Past Surgical History  Procedure Laterality Date  . Lower extrmity doppler      [place] 2008; no evidence for PAD  . US echocardiography  11/2008    Normal valves, mild LAE  . Left knee replacement  10/2006  . Cervical spine surgery  02/2008  . Joint replacement    . Spine surgery      Current Hospital Medications:  Home Meds:    Medication List    ASK your doctor about these medications       anagrelide 0.5 MG capsule  Commonly known as:  AGRYLIN  Take 1 capsule (0.5 mg total) by mouth 2 (two) times daily.     aspirin 81 MG chewable tablet  Chew 1 tablet (81 mg total) by mouth daily.     atorvastatin 80 MG tablet  Commonly known as:  LIPITOR  Take 1 tablet (80 mg total) by mouth daily at 6 PM.     benzonatate 100 MG capsule  Commonly known as:  TESSALON  Take 100 mg by mouth 3 (three) times daily as needed for cough.  carvedilol 12.5 MG tablet  Commonly known as:  COREG  Take 1 tablet (12.5 mg total) by mouth 2 (two) times daily with a meal.     chlorhexidine 0.12 % solution  Commonly known as:  PERIDEX  Use as directed 15 mLs in the mouth or throat daily.     chlorpheniramine-HYDROcodone 10-8 MG/5ML Lqcr  Commonly known as:  TUSSIONEX  Take 5 mLs by mouth every 12 (twelve) hours as needed for cough.     ciprofloxacin 500 MG tablet  Commonly known as:  CIPRO  Take 500 mg by mouth 2 (two) times daily. For 10 days. For UTI. Starting 11-13-13.     clopidogrel 75 MG tablet  Commonly known as:  PLAVIX  Take 75 mg by mouth daily at 6 PM.     colchicine 0.6 MG tablet  Take 0.6 mg by mouth daily at 6 PM.     diphenoxylate-atropine 2.5-0.025 MG per tablet  Commonly known as:  LOMOTIL  Take 1 tablet by  mouth 4 (four) times daily as needed for diarrhea or loose stools.     feeding supplement (ENSURE COMPLETE) Liqd  Take 237 mLs by mouth 2 (two) times daily between meals.     fidaxomicin 200 MG Tabs tablet  Commonly known as:  DIFICID  Take 200 mg by mouth 2 (two) times daily. For 10 days. For C-Diff. Starting 11/12/13.     folic acid 1 MG tablet  Commonly known as:  FOLVITE  Take 1 mg by mouth daily.     furosemide 40 MG tablet  Commonly known as:  LASIX  Take 40 mg by mouth 3 (three) times daily.     gabapentin 300 MG capsule  Commonly known as:  NEURONTIN  Take 1 capsule (300 mg total) by mouth 3 (three) times daily.     HYDROcodone-acetaminophen 7.5-325 MG per tablet  Commonly known as:  NORCO  Take 1 tablet by mouth every 4 (four) hours as needed for moderate pain.     lisinopril 5 MG tablet  Commonly known as:  PRINIVIL,ZESTRIL  Take 1 tablet (5 mg total) by mouth daily.     multivitamin with minerals tablet  Take 1 tablet by mouth daily.     nitroGLYCERIN 0.4 MG SL tablet  Commonly known as:  NITROSTAT  Place 0.4 mg under the tongue every 5 (five) minutes as needed for chest pain.     oxyCODONE-acetaminophen 5-325 MG per tablet  Commonly known as:  PERCOCET/ROXICET  Take 1 tablet by mouth every 6 (six) hours as needed for severe pain.     SANTYL ointment  Generic drug:  collagenase  Apply 1 application topically daily as needed.     thiamine 100 MG tablet  Take 1 tablet (100 mg total) by mouth daily.        Scheduled Meds: . aspirin  325 mg Oral Daily  . atorvastatin  80 mg Oral q1800  . Chlorhexidine Gluconate Cloth  6 each Topical Q0600  . enoxaparin (LOVENOX) injection  40 mg Subcutaneous Daily  . fidaxomicin  200 mg Oral BID  . furosemide  40 mg Intravenous BID  . mupirocin ointment  1 application Nasal BID  . potassium chloride  40 mEq Oral TID   Continuous Infusions:  PRN Meds:.sodium chloride, albuterol, docusate sodium, fentaNYL, food  thickener  Allergies: No Known Allergies  Family History  Problem Relation Age of Onset  . Heart attack Father     Social History:  reports that  he quit smoking about 45 years ago. His smoking use included Cigarettes and Pipe. He smoked 0.00 packs per day for 0 years. He has quit using smokeless tobacco. He reports that he drinks about 3.6 ounces of alcohol per week. He reports that he does not use illicit drugs.  ROS: A complete review of systems was performed.  All systems are negative except for pertinent findings as noted.  Physical Exam:  Vital signs in last 24 hours: Temp:  [97.5 F (36.4 C)-97.8 F (36.6 C)] 97.8 F (36.6 C) (09/08 0818) Pulse Rate:  [80-103] 98 (09/08 1409) Resp:  [11-23] 22 (09/08 1409) BP: (61-158)/(19-95) 63/48 mmHg (09/08 1405) SpO2:  [83 %-100 %] 99 % (09/08 1409) Weight:  [118.3 kg (260 lb 12.9 oz)] 118.3 kg (260 lb 12.9 oz) (09/08 0500) Constitutional:  Alert and oriented, No acute distress Cardiovascular: Regular rate and rhythm Respiratory: Normal respiratory effort GI: Abdomen is soft, nontender, nondistended, no abdominal masses GU: foley cath in place and pulled tight almost on traction; uncircum penis with some yellowish discharge around catheter; marked penile and scrotal edema; meatus not visualized due to edema Lymphatic: No lymphadenopathy Neurologic: Grossly intact, no focal deficits Psychiatric: Normal mood and affect  Laboratory Data:   Recent Labs  11/18/13 0403 11/19/13 0500 11/20/13 0500  WBC 23.5* 19.9* 13.8*  HGB 13.9 14.4 14.0  HCT 44.2 45.2 44.2  PLT 413* 324 199     Recent Labs  11/18/13 0403 11/19/13 0500 11/20/13 0500  NA 139 142 144  K 3.3* 3.7 3.4*  CL 104 107 109  GLUCOSE 125* 109* 86  BUN 56* 52* 47*  CALCIUM 7.8* 8.2* 8.2*  CREATININE 1.97* 1.59* 1.24     Results for orders placed during the hospital encounter of 11/15/13 (from the past 24 hour(s))  GLUCOSE, CAPILLARY     Status: None    Collection Time    11/19/13  4:10 PM      Result Value Ref Range   Glucose-Capillary 94  70 - 99 mg/dL  CBC     Status: Abnormal   Collection Time    11/20/13  5:00 AM      Result Value Ref Range   WBC 13.8 (*) 4.0 - 10.5 K/uL   RBC 4.92  4.22 - 5.81 MIL/uL   Hemoglobin 14.0  13.0 - 17.0 g/dL   HCT 44.2  39.0 - 52.0 %   MCV 89.8  78.0 - 100.0 fL   MCH 28.5  26.0 - 34.0 pg   MCHC 31.7  30.0 - 36.0 g/dL   RDW 18.2 (*) 11.5 - 15.5 %   Platelets 199  150 - 400 K/uL  COMPREHENSIVE METABOLIC PANEL     Status: Abnormal   Collection Time    11/20/13  5:00 AM      Result Value Ref Range   Sodium 144  137 - 147 mEq/L   Potassium 3.4 (*) 3.7 - 5.3 mEq/L   Chloride 109  96 - 112 mEq/L   CO2 24  19 - 32 mEq/L   Glucose, Bld 86  70 - 99 mg/dL   BUN 47 (*) 6 - 23 mg/dL   Creatinine, Ser 1.24  0.50 - 1.35 mg/dL   Calcium 8.2 (*) 8.4 - 10.5 mg/dL   Total Protein 5.0 (*) 6.0 - 8.3 g/dL   Albumin 2.4 (*) 3.5 - 5.2 g/dL   AST 8  0 - 37 U/L   ALT 9  0 - 53 U/L  Alkaline Phosphatase 102  39 - 117 U/L   Total Bilirubin 0.6  0.3 - 1.2 mg/dL   GFR calc non Af Amer 60 (*) >90 mL/min   GFR calc Af Amer 69 (*) >90 mL/min   Anion gap 11  5 - 15  PROCALCITONIN     Status: None   Collection Time    11/20/13  5:00 AM      Result Value Ref Range   Procalcitonin 0.25    GLUCOSE, CAPILLARY     Status: None   Collection Time    11/20/13 12:29 PM      Result Value Ref Range   Glucose-Capillary 91  70 - 99 mg/dL   Recent Results (from the past 240 hour(s))  MRSA PCR SCREENING     Status: Abnormal   Collection Time    11/15/13  6:02 PM      Result Value Ref Range Status   MRSA by PCR POSITIVE (*) NEGATIVE Final   Comment:            The GeneXpert MRSA Assay (FDA     approved for NASAL specimens     only), is one component of a     comprehensive MRSA colonization     surveillance program. It is not     intended to diagnose MRSA     infection nor to guide or     monitor treatment for     MRSA  infections.     RESULT CALLED TO, READ BACK BY AND VERIFIED WITH:     D.TURNER AT 2106 11/15/13  CULTURE, BLOOD (ROUTINE X 2)     Status: None   Collection Time    11/15/13  8:37 PM      Result Value Ref Range Status   Specimen Description BLOOD LEFT ARM   Final   Special Requests BOTTLES DRAWN AEROBIC ONLY 8CC   Final   Culture  Setup Time     Final   Value: 11/16/2013 04:13     Performed at [facility]   Culture     Final   Value:        BLOOD CULTURE RECEIVED NO GROWTH TO DATE CULTURE WILL BE HELD FOR 5 DAYS BEFORE ISSUING A FINAL NEGATIVE REPORT     Performed at [facility]   Report Status PENDING   Incomplete  URINE CULTURE     Status: None   Collection Time    11/15/13 10:32 PM      Result Value Ref Range Status   Specimen Description URINE, CATHETERIZED   Final   Special Requests NONE   Final   Culture  Setup Time     Final   Value: 11/16/2013 00:55     Performed at [facility]     Final   Value: 3,000 COLONIES/ML     Performed at [facility]   Culture     Final   Value: INSIGNIFICANT GROWTH     Performed at [facility]   Report Status 11/17/2013 FINAL   Final  CULTURE, RESPIRATORY (NON-EXPECTORATED)     Status: None   Collection Time    11/15/13 11:30 PM      Result Value Ref Range Status   Specimen Description TRACHEAL ASPIRATE   Final   Special Requests NONE   Final   Gram Stain     Final   Value: MODERATE WBC PRESENT,BOTH PMN AND MONONUCLEAR  RARE SQUAMOUS EPITHELIAL CELLS PRESENT     RARE GRAM POSITIVE COCCI     IN PAIRS RARE GRAM POSITIVE RODS     Performed at [facility]   Culture     Final   Value: Non-Pathogenic Oropharyngeal-type Flora Isolated.     Performed at [facility]   Report Status 11/18/2013 FINAL   Final  CULTURE, BLOOD (ROUTINE X 2)     Status: None   Collection Time    11/16/13  8:45 AM      Result Value Ref Range Status   Specimen Description BLOOD LEFT  ANTECUBITAL   Final   Special Requests BOTTLES DRAWN AEROBIC ONLY 4CC   Final   Culture  Setup Time     Final   Value: 11/16/2013 14:27     Performed at [facility]   Culture     Final   Value:        BLOOD CULTURE RECEIVED NO GROWTH TO DATE CULTURE WILL BE HELD FOR 5 DAYS BEFORE ISSUING A FINAL NEGATIVE REPORT     Performed at [facility]   Report Status PENDING   Incomplete    Renal Function:  Recent Labs  11/15/13 1950 11/16/13 0500 11/17/13 0424 11/18/13 0403 11/19/13 0500 11/20/13 0500  CREATININE 3.42* 3.13* 2.51* 1.97* 1.59* 1.24   Estimated Creatinine Clearance: 72.9 ml/min (by C-G formula based on Cr of 1.24).  Radiologic Imaging: Dg Chest Port 1 View  11/20/2013   CLINICAL DATA:  Respiratory failure.  EXAM: PORTABLE CHEST - 1 VIEW  COMPARISON:  11/16/2013.  FINDINGS: Right lung base now appears clear. Persistent left lung base opacity is most consistent with atelectasis likely with a small residual effusion. No pulmonary edema or convincing pneumonia.  Stable cardiomegaly.  No mediastinal or hilar masses.  Right internal jugular central venous line is unchanged. All other support apparatus have been removed.  IMPRESSION: 1. Improved lung aeration when compared to the prior study. No residual pulmonary edema. Improved/resolved right lung base atelectasis. Mild residual left lung base atelectasis likely with a small effusion.   Electronically Signed   By: Lajean Manes M.D.   On: 11/20/2013 07:44     Impression/Recommendation Penile/scrotal edema in volume overloaded pt with septic shock, right ventricular failure, and ischemic cardiomyopthy---pt has foley in place that is draining well.  Urethral anatomy appears distorted due to edema in uncircumcised male.  1) Elevate scrotum/penis with folded towels to aid with edema.    2) Leave foley in place until edema improves and pt stable from medical standpoint at which time he will require a void trial.  Pt may  be able to void on his own as he has been able to in the recent past (according to him).  He is not stable enough from a cardiac standpoint to undergo a procedure and is therefore not an SP tube candidate.  At any rate, if he is able to void on his own he will not require a SPT nor chronic foley.    3) Keep foley off traction.  If this cannot be accomplished by keeping the leg strap high on his thigh with slack in the catheter, the catheter can be taped to his abdomen.      Debbrah Alar 11/20/2013, 2:47 PM

## 2013-11-20 NOTE — Progress Notes (Signed)
[facility] ADULT ICU REPLACEMENT PROTOCOL FOR AM LAB REPLACEMENT ONLY  The patient does apply for the [facility] Adult ICU Electrolyte Replacment Protocol based on the criteria listed below:   1. Is GFR >/= 40 ml/min? Yes.    Patient's GFR today is 60 2. Is urine output >/= 0.5 ml/kg/hr for the last 6 hours? Yes.   Patient's UOP is 0.5 ml/kg/hr 3. Is BUN < 60 mg/dL? Yes.    Patient's BUN today is 47 4. Abnormal electrolyte(s): K 3.4 5. Ordered repletion with: per protocol 6. If a panic level lab has been reported, has the CCM MD in charge been notified? No..   Physician:    Ronda Fairly A 11/20/2013 6:40 AM

## 2013-11-20 NOTE — Progress Notes (Signed)
PULMONARY / CRITICAL CARE MEDICINE  Name: Fernando Lane MRN: [id number] DOB: [birth year]    ADMISSION DATE:  11/15/2013 CONSULTATION DATE:  11/15/2013  REFERRING MD : Oval Linsey EDP  CHIEF COMPLAINT:  SOB  INITIAL PRESENTATION: 63 yo male with R foot gangrene and pseudomembranous colitis presented to [facility] ER with shock and acute respiratory failure requiring intubation. Transferred to [facility] for further management.  STUDIES / SIGNIFICANT EVENTS:  9/3 Intubated in [place] ED, transferred to [facility] 9/4 Extubated. Trop I elevated @ 2.63 9/04 WOC consult: Entire buttocks and posterior scrotum with erythremia and patchy areas of skin loss. Right foot is purple and mottled, some toes with dry intact eschar. Barrier cream to scrotum and buttocks to protect, repel moisture, and promote healing. Foam dressing to outer leg wound. If aggressive plan of care is desired to right foot, please consult VVS team for further plan of care. 9/04 VVS Consult: This is a 63 y.o. 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  -foot with gangrene, but doubt this is the source of his sepsis. Plavix discontinued in anticipation for possible surgery 9/4 TTE:  Limited study, akinetic apex with unusual appearance, cannot rule out distal muscular VSD, mildly dilated RA, severely dilated RV, moderately dilated RA moderate TR, no RVSP estimate given 9/05 Cards Consult: consider cardiac MRI to further elucidate the abnormal LV anatomy and clarify cause of RV dilation 9/07 Weaning off vasopressors  9/08 Off vasopressors. Diuresis ordered for severe hypervolemia. Discussed with Dr Debara Pickett re: Echo findings of severe RV dilation. Consider RHC when more stabilized.  9/08 Urology consult requested for severe penile and scrotal edema with markedly distorted urethral anatomy - recs for long term mgmt requested  LINES/TUBES: ETT 9/03 >> 9/04 R IJ CVL 9/03 >>   MICRO: MRSA PCR 9/03 >>  POS Resp 9/03 >> NOF Urine 9/03 >> NEG Blood 9/04 >> NEG  PCT 9/03: 0.26,  9/08: 0.26  ANTIMICROBIALS: Vanc 9/03 >> 9/07 Pip-tazo 9/03 >> 9/07 fidaxomicin 9/04 >>    SUBJ:   Pleasant spirits. Cognition intact. No distress, No new complaints   VITAL SIGNS: Temp:  [97.5 F (36.4 C)-97.8 F (36.6 C)] 97.8 F (36.6 C) (09/08 0818) Pulse Rate:  [80-103] 92 (09/08 1100) Resp:  [11-23] 13 (09/08 1100) BP: (72-158)/(40-95) 96/72 mmHg (09/08 1100) SpO2:  [83 %-100 %] 96 % (09/08 1100) Weight:  [118.3 kg (260 lb 12.9 oz)] 118.3 kg (260 lb 12.9 oz) (09/08 0500)  HEMODYNAMICS:    VENTILATOR SETTINGS:   INTAKE / OUTPUT: Intake/Output     09/07 0701 - 09/08 0700 09/08 0701 - 09/09 0700   P.O.  220   I.V. (mL/kg) 271.9 (2.3) 50 (0.4)   IV Piggyback 300    Total Intake(mL/kg) 571.9 (4.8) 270 (2.3)   Urine (mL/kg/hr) 1010 (0.4) 160 (0.2)   Total Output 1010 160   Net -438.1 +110        Stool Occurrence  1 x    PHYSICAL EXAMINATION: General:  No distress Neuro:  RASS 0, no focal deficits, cognition intact HEENT:  Normal Cardiovascular:  Regular, no murmurs Lungs:  Clear anteriorly Abdomen: obese, soft, +BS GU: severe scrotal and penile edema, markedly distorted urethral anatomy Ext: 3 -4+ symmetric edema with symmetric erythema. Gangrenous toes on right foot    LABS: I have reviewed all of today's lab results. 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 H/o Pericardial effusion in setting of STEMI s/p pericardiocentesis, on colchicine PTA P:   Cardiology following Consider cardiac MRI Consider RHC Holding clopidogrel Cont ASA, statin   PULMONARY A: Acute respiratory failure - resolved Former smoker, prior documentation of COPD, no acute wheezing OSA, home CPAP P:   Cont Supplemental oxygen to maintain SpO2 > 93% Cont PRN BDs  RENAL A:   AKI, improving Metabolic acidosis, resolved Hypokalemia, recurrent Severe hypervolemia/anasarca Markedly distorted urethral anatomy due to prior Foley P:   Monitor BMET intermittently Monitor I/Os Correct electrolytes as indicated Lasix X 2 doses 9/08 Urology to see to recommend long term management  GASTROINTESTINAL A:   Dysphagia of unclear etiology P:   SUP: N/I post extubation D3 diet SLP following  HEMATOLOGIC A:   Polycythemia vera P:  DVT px: SQ LMWH Monitor CBC intermittently Transfuse per usual ICU guidelines Anagrelide PTA for thrombocytosis - DC'd 9/08   INFECTIOUS A:  Pseudomembranous colitis, PCR positive 8/31, treated with Fidaxomicin prior to this admission Doubt pneumonia PVD with dry gangrene - doubt source of sepsis Urine cx negative 9/3 P:   Follow blood, respiratory cx 9/3 Zosyn / Vancomycin 9/03 >> 9/07 Fidaxomicin 9/4 >>   ENDOCRINE A:   Hyperglycemia without prior dx of DM, resolved P:   Monitor glu on chem panels  NEUROLOGIC A:   Acute metabolic encephalopathy, resolved Pain P:   Cont fentanyl PRN  Transfer to SDU. To remain on PCCM service. Urology to see 9/08 Gaynelle Arabian). Cards to consider further eval of severe RV dysfunction (RHC and/or cardiac MRI). Discussed with Dr Debara Pickett. Lasix ordered X 2 9/08   Merton Border, MD Pulmonary and [place] Pager: [phone]  11/20/2013, 1:58 PM

## 2013-11-20 NOTE — Progress Notes (Signed)
Physical Therapy Treatment Patient Details Name: Fernando Lane MRN: [id number] DOB: Sep 27, 1950 Today's Date: 11/20/2013    History of Present Illness 63 yo male admitted from SNF in [place] after recent hospital admission for ischemic Rt LE and Nstemi. pt s/p fall at snf with new spleen contusion and Rib fx. Pt now readmitted with HTN, sepsis due to pyelonephritis, and metabolic encephalopathy.    PT Comments    Pt admitted with above. Pt currently with functional limitations due to balance and endurance deficits as well as right LE pain and weakness UE and LES.  Pt will benefit from skilled PT to increase their independence and safety with mobility to allow discharge to the venue listed below.    Follow Up Recommendations  SNF;Supervision/Assistance - 24 hour     Equipment Recommendations  None recommended by PT    Recommendations for Other Services       Precautions / Restrictions Precautions Precautions: Fall Restrictions Weight Bearing Restrictions: No    Mobility  Bed Mobility               General bed mobility comments: Placed bed in full chair position with pt sitting for 15 minutes.  Pt using rails at times and at times not using rails.    Transfers Overall transfer level: Needs assistance Equipment used: None Transfers: Sit to/from Stand           General transfer comment: Attempted three stands with pt barely clearing bottom.  Pt unable to stand at this time due to weakness and right LE pain.    Ambulation/Gait                 Stairs            Wheelchair Mobility    Modified Rankin (Stroke Patients Only)       Balance Overall balance assessment: Needs assistance;History of Falls Sitting-balance support: Bilateral upper extremity supported;No upper extremity supported;Feet supported Sitting balance-Leahy Scale: Poor Sitting balance - Comments: Pt intermittently needed UE support in sitting.  Placed bed in full chair position.Pt  sat a total of 15 minutes.  Postural control: Posterior lean                          Cognition Arousal/Alertness: Awake/alert Behavior During Therapy: WFL for tasks assessed/performed Overall Cognitive Status: Within Functional Limits for tasks assessed                      Exercises General Exercises - Lower Extremity Ankle Circles/Pumps: AROM;Both;5 reps;Seated Long Arc Quad: AROM;Both;5 reps;Seated Heel Slides: AROM;Both;10 reps;Supine    General Comments        Pertinent Vitals/Pain Pain Assessment: Faces Faces Pain Scale: Hurts whole lot Pain Location: bil LE pain Pain Descriptors / Indicators: Constant;Heaviness;Sharp;Stabbing Pain Intervention(s): Limited activity within patient's tolerance;Monitored during session;Repositioned VSS    Home Living                      Prior Function            PT Goals (current goals can now be found in the care plan section) Progress towards PT goals: Progressing toward goals    Frequency  Min 2X/week    PT Plan Current plan remains appropriate    Co-evaluation             End of Session Equipment Utilized During Treatment: Gait belt;Oxygen Activity Tolerance: Patient limited  by fatigue;Patient limited by pain Patient left: in bed;with call bell/phone within reach;with family/visitor present (in chair position)     Time: 1155-2080 PT Time Calculation (min): 41 min  Charges:  $Therapeutic Exercise: 8-22 mins $Therapeutic Activity: 23-37 mins                    G Codes:      INGOLD,Adyline Huberty 11/24/13, 11:04 AM [facility] [phone] (pager)

## 2013-11-21 DIAGNOSIS — I50810 Right heart failure, unspecified: Secondary | ICD-10-CM

## 2013-11-21 DIAGNOSIS — E8779 Other fluid overload: Secondary | ICD-10-CM

## 2013-11-21 DIAGNOSIS — I5023 Acute on chronic systolic (congestive) heart failure: Secondary | ICD-10-CM

## 2013-11-21 DIAGNOSIS — I509 Heart failure, unspecified: Secondary | ICD-10-CM

## 2013-11-21 LAB — CBC
HCT: 43.9 % (ref 39.0–52.0)
Hemoglobin: 13.7 g/dL (ref 13.0–17.0)
MCH: 28.1 pg (ref 26.0–34.0)
MCHC: 31.2 g/dL (ref 30.0–36.0)
MCV: 90.1 fL (ref 78.0–100.0)
PLATELETS: 174 10*3/uL (ref 150–400)
RBC: 4.87 MIL/uL (ref 4.22–5.81)
RDW: 18.4 % — AB (ref 11.5–15.5)
WBC: 14.2 10*3/uL — ABNORMAL HIGH (ref 4.0–10.5)

## 2013-11-21 LAB — BASIC METABOLIC PANEL
Anion gap: 9 (ref 5–15)
BUN: 38 mg/dL — ABNORMAL HIGH (ref 6–23)
CALCIUM: 8.1 mg/dL — AB (ref 8.4–10.5)
CO2: 25 mEq/L (ref 19–32)
CREATININE: 0.96 mg/dL (ref 0.50–1.35)
Chloride: 110 mEq/L (ref 96–112)
GFR calc Af Amer: >90 mL/min (ref >90)
GFR, EST NON AFRICAN AMERICAN: 86 mL/min — AB (ref >90)
GLUCOSE: 86 mg/dL (ref 70–99)
POTASSIUM: 3.9 meq/L (ref 3.7–5.3)
Sodium: 144 mEq/L (ref 137–147)

## 2013-11-21 LAB — CARBOXYHEMOGLOBIN
CARBOXYHEMOGLOBIN: 1.3 % (ref 0.5–1.5)
Carboxyhemoglobin: 1.3 % (ref 0.5–1.5)
Methemoglobin: 0.7 % (ref 0.0–1.5)
Methemoglobin: 0.7 % (ref 0.0–1.5)
O2 SAT: 77.1 %
O2 Saturation: 73.6 %
Total hemoglobin: 13.7 g/dL (ref 13.5–18.0)
Total hemoglobin: 13.9 g/dL (ref 13.5–18.0)

## 2013-11-21 MED ORDER — POTASSIUM CHLORIDE CRYS ER 20 MEQ PO TBCR
20.0000 meq | EXTENDED_RELEASE_TABLET | Freq: Two times a day (BID) | ORAL | Status: DC
  Administered 2013-11-21 – 2013-11-24 (×8): 20 meq via ORAL
  Filled 2013-11-21: qty 1
  Filled 2013-11-21: qty 2
  Filled 2013-11-21 (×3): qty 1
  Filled 2013-11-21 (×3): qty 2
  Filled 2013-11-21 (×3): qty 1
  Filled 2013-11-21 (×2): qty 2

## 2013-11-21 MED ORDER — ENOXAPARIN SODIUM 60 MG/0.6ML ~~LOC~~ SOLN
55.0000 mg | Freq: Every day | SUBCUTANEOUS | Status: DC
  Administered 2013-11-21 – 2013-11-28 (×8): 55 mg via SUBCUTANEOUS
  Filled 2013-11-21 (×9): qty 0.6

## 2013-11-21 MED ORDER — METOLAZONE 2.5 MG PO TABS
2.5000 mg | ORAL_TABLET | Freq: Two times a day (BID) | ORAL | Status: DC
  Administered 2013-11-21 – 2013-11-23 (×6): 2.5 mg via ORAL
  Filled 2013-11-21 (×8): qty 1

## 2013-11-21 MED ORDER — SPIRONOLACTONE 25 MG PO TABS
25.0000 mg | ORAL_TABLET | Freq: Every day | ORAL | Status: DC
  Administered 2013-11-21 – 2013-12-04 (×14): 25 mg via ORAL
  Filled 2013-11-21 (×14): qty 1

## 2013-11-21 NOTE — Progress Notes (Signed)
ADVANCED HEART FAILURE ROUNDING NOTE DAILY PROGRESS NOTE  Subjective:   I stated him on lasix gtt last night at 5m/hr. Good urine output. -2L. CVP ~ 15. Weight down 6 pounds. Renal function stable.  Co-ox 74%.   Objective:  Temp:  [97.4 F (36.3 C)-98.5 F (36.9 C)] 98.5 F (36.9 C) (09/09 0346) Pulse Rate:  [87-103] 95 (09/09 0700) Resp:  [13-22] 18 (09/09 0700) BP: (61-158)/(19-86) 106/71 mmHg (09/09 0700) SpO2:  [93 %-100 %] 99 % (09/09 0700) Weight:  [115.3 kg (254 lb 3.1 oz)] 115.3 kg (254 lb 3.1 oz) (09/09 0500) Weight change: -3 kg (-6 lb 9.8 oz)  Intake/Output from previous day: 09/08 0701 - 09/09 0700 In: 620 [P.O.:540; I.V.:80] Out: 2555 [Urine:2555]  Intake/Output from this shift:    Medications: Current Facility-Administered Medications  Medication Dose Route Frequency Provider Last Rate Last Dose  . 0.9 %  sodium chloride infusion  250 mL Intravenous PRN PCorey Harold NP 10 mL/hr at 11/18/13 1000 250 mL at 11/18/13 1000  . albuterol (PROVENTIL) (2.5 MG/3ML) 0.083% nebulizer solution 2.5 mg  2.5 mg Nebulization Q4H PRN DWilhelmina Mcardle MD      . aspirin tablet 325 mg  325 mg Oral Daily KDoree Fudge MD   325 mg at 11/20/13 0929  . atorvastatin (LIPITOR) tablet 80 mg  80 mg Oral q1800 KDoree Fudge MD   80 mg at 11/20/13 1753  . docusate sodium (COLACE) capsule 100 mg  100 mg Oral Daily PRN TMelvenia Needles NP   100 mg at 11/18/13 0545  . enoxaparin (LOVENOX) injection 40 mg  40 mg Subcutaneous Daily DWilhelmina Mcardle MD   40 mg at 11/20/13 1225  . fentaNYL (SUBLIMAZE) injection 25-50 mcg  25-50 mcg Intravenous Q2H PRN DWilhelmina Mcardle MD   50 mcg at 11/20/13 1600  . fidaxomicin (DIFICID) tablet 200 mg  200 mg Oral BID NTomasita Morrow RPH   200 mg at 11/20/13 2241  . food thickener (THICK IT) powder   Oral PRN KDoree Fudge MD      . furosemide (LASIX) 250 mg in dextrose 5 % 250 mL (1 mg/mL) infusion  15 mg/hr  Intravenous Continuous DJolaine Artist MD 15 mL/hr at 11/21/13 0600 15 mg/hr at 11/21/13 0600    Physical Exam: General:  Chronically ill-appearing. No resp difficulty HEENT: normal Neck: supple.thick. Unable to see JVD. Carotids 2+ bilat; no bruits. Cor: RRR distant Lungs: clear anteriorly Abdomen: markedly obese + distended.  Good bowel sounds. Extremities: no cyanosis, clubbing, rash, 3-4+ edema/anasarca. + severe penile and scrotal edema. +severe decubitus ulcers. Gangrenous toes R foot Neuro: alert & orientedx3, cranial nerves grossly intact. moves all 4 extremities w/o difficulty. Affect pleasant   Lab Results: Results for orders placed during the hospital encounter of 11/15/13 (from the past 48 hour(s))  GLUCOSE, CAPILLARY     Status: Abnormal   Collection Time    11/19/13  8:23 AM      Result Value Ref Range   Glucose-Capillary 116 (*) 70 - 99 mg/dL  GLUCOSE, CAPILLARY     Status: Abnormal   Collection Time    11/19/13 12:29 PM      Result Value Ref Range   Glucose-Capillary 137 (*) 70 - 99 mg/dL  GLUCOSE, CAPILLARY     Status: None   Collection Time    11/19/13  4:10 PM      Result Value Ref Range   Glucose-Capillary 94  70 - 99  mg/dL  CARBOXYHEMOGLOBIN     Status: None   Collection Time    11/20/13 12:31 AM      Result Value Ref Range   Total hemoglobin 13.7  13.5 - 18.0 g/dL   O2 Saturation 77.1     Carboxyhemoglobin 1.3  0.5 - 1.5 %   Methemoglobin 0.7  0.0 - 1.5 %  CBC     Status: Abnormal   Collection Time    11/20/13  5:00 AM      Result Value Ref Range   WBC 13.8 (*) 4.0 - 10.5 K/uL   RBC 4.92  4.22 - 5.81 MIL/uL   Hemoglobin 14.0  13.0 - 17.0 g/dL   HCT 44.2  39.0 - 52.0 %   MCV 89.8  78.0 - 100.0 fL   MCH 28.5  26.0 - 34.0 pg   MCHC 31.7  30.0 - 36.0 g/dL   RDW 18.2 (*) 11.5 - 15.5 %   Platelets 199  150 - 400 K/uL   Comment: REPEATED TO VERIFY  COMPREHENSIVE METABOLIC PANEL     Status: Abnormal   Collection Time    11/20/13  5:00 AM       Result Value Ref Range   Sodium 144  137 - 147 mEq/L   Potassium 3.4 (*) 3.7 - 5.3 mEq/L   Chloride 109  96 - 112 mEq/L   CO2 24  19 - 32 mEq/L   Glucose, Bld 86  70 - 99 mg/dL   BUN 47 (*) 6 - 23 mg/dL   Creatinine, Ser 1.24  0.50 - 1.35 mg/dL   Calcium 8.2 (*) 8.4 - 10.5 mg/dL   Total Protein 5.0 (*) 6.0 - 8.3 g/dL   Albumin 2.4 (*) 3.5 - 5.2 g/dL   AST 8  0 - 37 U/L   ALT 9  0 - 53 U/L   Alkaline Phosphatase 102  39 - 117 U/L   Total Bilirubin 0.6  0.3 - 1.2 mg/dL   GFR calc non Af Amer 60 (*) >90 mL/min   GFR calc Af Amer 69 (*) >90 mL/min   Comment: (NOTE)     The eGFR has been calculated using the CKD EPI equation.     This calculation has not been validated in all clinical situations.     eGFR's persistently <90 mL/min signify possible Chronic Kidney     Disease.   Anion gap 11  5 - 15  PROCALCITONIN     Status: None   Collection Time    11/20/13  5:00 AM      Result Value Ref Range   Procalcitonin 0.25     Comment:            Interpretation:     PCT (Procalcitonin) <= 0.5 ng/mL:     Systemic infection (sepsis) is not likely.     Local bacterial infection is possible.     (NOTE)             ICU PCT Algorithm               Non ICU PCT Algorithm        ----------------------------     ------------------------------             PCT < 0.25 ng/mL                 PCT < 0.1 ng/mL         Stopping of antibiotics  Stopping of antibiotics           strongly encouraged.               strongly encouraged.        ----------------------------     ------------------------------           PCT level decrease by               PCT < 0.25 ng/mL           >= 80% from peak PCT           OR PCT 0.25 - 0.5 ng/mL          Stopping of antibiotics                                                 encouraged.         Stopping of antibiotics               encouraged.        ----------------------------     ------------------------------           PCT level decrease by               PCT >= 0.25 ng/mL           < 80% from peak PCT            AND PCT >= 0.5 ng/mL            Continuing antibiotics                                                  encouraged.           Continuing antibiotics                encouraged.        ----------------------------     ------------------------------         PCT level increase compared          PCT > 0.5 ng/mL             with peak PCT AND              PCT >= 0.5 ng/mL             Escalation of antibiotics                                              strongly encouraged.          Escalation of antibiotics            strongly encouraged.  GLUCOSE, CAPILLARY     Status: Abnormal   Collection Time    11/20/13  8:17 AM      Result Value Ref Range   Glucose-Capillary 68 (*) 70 - 99 mg/dL  GLUCOSE, CAPILLARY     Status: None   Collection Time    11/20/13 12:29 PM      Result Value Ref Range   Glucose-Capillary 91  70 - 99 mg/dL  GLUCOSE, CAPILLARY  Status: None   Collection Time    11/20/13  3:44 PM      Result Value Ref Range   Glucose-Capillary 97  70 - 99 mg/dL  GLUCOSE, CAPILLARY     Status: None   Collection Time    11/20/13  7:50 PM      Result Value Ref Range   Glucose-Capillary 83  70 - 99 mg/dL  BASIC METABOLIC PANEL     Status: Abnormal   Collection Time    11/21/13  5:00 AM      Result Value Ref Range   Sodium 144  137 - 147 mEq/L   Potassium 3.9  3.7 - 5.3 mEq/L   Chloride 110  96 - 112 mEq/L   CO2 25  19 - 32 mEq/L   Glucose, Bld 86  70 - 99 mg/dL   BUN 38 (*) 6 - 23 mg/dL   Creatinine, Ser 0.96  0.50 - 1.35 mg/dL   Calcium 8.1 (*) 8.4 - 10.5 mg/dL   GFR calc non Af Amer 86 (*) >90 mL/min   GFR calc Af Amer >90  >90 mL/min   Comment: (NOTE)     The eGFR has been calculated using the CKD EPI equation.     This calculation has not been validated in all clinical situations.     eGFR's persistently <90 mL/min signify possible Chronic Kidney     Disease.   Anion gap 9  5 - 15  CBC     Status: Abnormal    Collection Time    11/21/13  5:00 AM      Result Value Ref Range   WBC 14.2 (*) 4.0 - 10.5 K/uL   RBC 4.87  4.22 - 5.81 MIL/uL   Hemoglobin 13.7  13.0 - 17.0 g/dL   HCT 43.9  39.0 - 52.0 %   MCV 90.1  78.0 - 100.0 fL   MCH 28.1  26.0 - 34.0 pg   MCHC 31.2  30.0 - 36.0 g/dL   RDW 18.4 (*) 11.5 - 15.5 %   Platelets 174  150 - 400 K/uL   Comment: REPEATED TO VERIFY  CARBOXYHEMOGLOBIN     Status: None   Collection Time    11/21/13  5:00 AM      Result Value Ref Range   Total hemoglobin 13.9  13.5 - 18.0 g/dL   O2 Saturation 73.6     Carboxyhemoglobin 1.3  0.5 - 1.5 %   Methemoglobin 0.7  0.0 - 1.5 %    Imaging: Imaging results have been reviewed  Assessment: 1. 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 RV severely dilated.  2. Anasarca 3. CAD s/p out-of -hospital LAD infarct 5/15      --LAD and RCA 100% occluded. Lcx 30% EF 20% -> medical management 4. Pericardial effusion/tamponade - s/p tap 5/15 5. PAD, severe 6. Morbid obesity 7. Acute on chronic respiratory failure 8. Severe decubiti 9. R foot gangrene 10. Acute kidney injury, improved 11. Protein-calorie malnutrition  Plan/discussion:  He has massive volume overload in the setting of biventricular HF with R>>>L symptoms. Suspect RHF is multifactorial. He is responding well to lasix gtt. Co-ox good. Renal failure resolved.  His baseline weight from May is about 215 so he probably has about 45 pounds of fluid to give. Continue lasix gtt. Add metolazone and spiro.  Will start low-dose ACE and b-blocker as BP permits. May need RHC prior to discharge depending  on how functional we can get him. Consider readjusting lovenox dosing based on weight. Will d/w Pharmacy.   Length of Stay:  LOS: 6 days  Glori Bickers MD 11/21/2013, 7:12 AM

## 2013-11-21 NOTE — Progress Notes (Signed)
PULMONARY / CRITICAL CARE MEDICINE  Name: Fernando Lane MRN: [id number] DOB: 20-Dec-1950    ADMISSION DATE:  11/15/2013 CONSULTATION DATE:  11/15/2013  REFERRING MD : Oval Linsey EDP  CHIEF COMPLAINT:  SOB  INITIAL PRESENTATION: 63 yo male with R foot gangrene and pseudomembranous colitis presented to [facility] ER with shock and acute respiratory failure requiring intubation. Transferred to [facility] for further management.  STUDIES / SIGNIFICANT EVENTS:  9/3 Intubated in [place] ED, transferred to [facility] 9/4 Extubated. Trop I elevated @ 2.63 9/04 WOC consult: Entire buttocks and posterior scrotum with erythremia and patchy areas of skin loss. Right foot is purple and mottled, some toes with dry intact eschar. Barrier cream to scrotum and buttocks to protect, repel moisture, and promote healing. Foam dressing to outer leg wound. If aggressive plan of care is desired to right foot, please consult VVS team for further plan of care. 9/04 VVS Consult: This is a 64 y.o. 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  -foot with gangrene, but doubt this is the source of his sepsis. Plavix discontinued in anticipation for possible surgery 9/4 TTE:  Limited study, akinetic apex with unusual appearance, cannot rule out distal muscular VSD, mildly dilated RA, severely dilated RV, moderately dilated RA moderate TR, no RVSP estimate given 9/05 Cards Consult: consider cardiac MRI to further elucidate the abnormal LV anatomy and clarify cause of RV dilation 9/07 Weaning off vasopressors  9/08 Off vasopressors. Diuresis ordered for severe hypervolemia. Discussed with Dr Debara Pickett re: Echo findings of severe RV dilation. Consider RHC when more stabilized.  9/08 Urology consult: see full note. Rec leaving Foley cath until penile and scrotal edema resolve 9/08 PM: Furosemide gtt initiated by Cardiology 9/09 Advanced Directives conversation initiated 9/09 by Dr Alva Garnet. Advised no  CPR/ACLS and DNI or short term intubation only. Palliative Care consultation requested. Transfer to SDU ordered. TRH to assume care as of AM 9/10 and PCCM to sign off   LINES/TUBES: ETT 9/03 >> 9/04 R IJ CVL 9/03 >>   MICRO: MRSA PCR 9/03 >> POS Resp 9/03 >> NOF Urine 9/03 >> NEG Blood 9/04 >> NEG  PCT 9/03: 0.26,  9/08: 0.26  ANTIMICROBIALS: Vanc 9/03 >> 9/07 Pip-tazo 9/03 >> 9/07 fidaxomicin 8/31 >> 9/09   SUBJ:   Pleasant spirits. Cognition intact. No distress, No new complaints   VITAL SIGNS: Temp:  [97.4 F (36.3 C)-98.5 F (36.9 C)] 97.5 F (36.4 C) (09/09 1256) Pulse Rate:  [85-101] 89 (09/09 1500) Resp:  [12-22] 17 (09/09 1500) BP: (68-134)/(44-84) 114/79 mmHg (09/09 1500) SpO2:  [93 %-99 %] 98 % (09/09 1500) Weight:  [115.3 kg (254 lb 3.1 oz)] 115.3 kg (254 lb 3.1 oz) (09/09 0500)  HEMODYNAMICS: CVP:  [15 mmHg-16 mmHg] 16 mmHg  VENTILATOR SETTINGS:   INTAKE / OUTPUT: Intake/Output     09/08 0701 - 09/09 0700 09/09 0701 - 09/10 0700   P.O. 740 200   I.V. (mL/kg) 80 (0.7) 120 (1)   IV Piggyback     Total Intake(mL/kg) 820 (7.1) 320 (2.8)   Urine (mL/kg/hr) 2555 (0.9) 2100 (2.1)   Total Output 2555 2100   Net -1735 -1780        Stool Occurrence 4 x     PHYSICAL EXAMINATION: General:  No distress Neuro:  RASS 0, no focal deficits, cognition intact HEENT:  Normal Cardiovascular:  Regular, no murmurs Lungs:  Clear anteriorly Abdomen: obese, soft, +BS GU: severe scrotal and penile edema, markedly  distorted urethral anatomy Ext: 3+ symmetric edema with symmetric erythema. Gangrenous toes on right foot    LABS: I have reviewed all of today's lab results. Relevant abnormalities are discussed in the A/P section  CXR: CM, no overt edema   ASSESSMENT / PLAN:  CARDIOVASCULAR A:  Shock, multifactorial - resolved Mildly elevated trop I (peak 2.63 on 9/04)  Inoperable CAD SVT on presentation, resolved Recent STEMI 08/07/13, medical management  recommended Severe right heart dysfunction - unclear whether due to recent RV infarct vs PAH Aneurysmal dilation of heart apex PVD with dry gangrene R foot  H/o Pericardial effusion in setting of STEMI s/p pericardiocentesis, on colchicine PTA P:  Cardiology, VVS following Per Cardiology, consider cardiac MRI vs RHC Holding clopidogrel Cont ASA, statin   PULMONARY A: Acute respiratory failure - resolved Former smoker, prior documentation of COPD, no acute wheezing OSA, home CPAP P:   Cont Supplemental oxygen to maintain SpO2 > 93% Cont PRN BDs  RENAL A:   AKI, resolved Metabolic acidosis, resolved Hypokalemia, recurrent Severe hypervolemia/anasarca Markedly distorted urethral anatomy due to prior Foley P:   Monitor BMET intermittently Monitor I/Os Correct electrolytes as indicated Furosemide gtt per Cards Leave Foley in place until penile edema resolves  GASTROINTESTINAL A:   Dysphagia of unclear etiology P:   SUP: N/I post extubation D3 diet SLP following  HEMATOLOGIC A:   Polycythemia vera Relative thrombocytopenia (baseline plt count 800k) P:  DVT px: SQ LMWH Monitor CBC intermittently Transfuse per usual ICU guidelines Holding Anagrelide - was on PTA for thrombocytosis - DC'd 9/08   INFECTIOUS A:  Pseudomembranous colitis, 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 Palliative Care consult requested 9/09. Cont Lasix gtt.    Merton Border, MD [facility] [place] Pager: [phone]  11/21/2013, 3:45 PM

## 2013-11-21 NOTE — Progress Notes (Signed)
  [facility] Progress Note  11/21/2013 8:26 AM  Subjective:  Feet feel unchanged.  Tmax 98.5 BP sys 60s-150s NSR 02 985 2L  Filed Vitals:   11/21/13 0800  BP: 101/73  Pulse: 88  Temp:   Resp: 16    Physical Exam: Extremities:  Dry gangrene of right 4th toe. Some mottling of left foot. 3+ edema lower legs bilaterally Lungs: clear to auscultation anteriorly Heart: distant heart sounds, regular  CBC    Component Value Date/Time   WBC 14.2* 11/21/2013 0500   WBC 12.9* 04/04/2013 1125   RBC 4.87 11/21/2013 0500   RBC 5.23 04/04/2013 1125   HGB 13.7 11/21/2013 0500   HGB 15.1 04/04/2013 1125   HCT 43.9 11/21/2013 0500   HCT 46.9 04/04/2013 1125   PLT 174 11/21/2013 0500   PLT 459* 04/04/2013 1125   MCV 90.1 11/21/2013 0500   MCV 89.7 04/04/2013 1125   MCH 28.1 11/21/2013 0500   MCH 28.9 04/04/2013 1125   MCHC 31.2 11/21/2013 0500   MCHC 32.2 04/04/2013 1125   RDW 18.4* 11/21/2013 0500   RDW 16.1* 04/04/2013 1125   LYMPHSABS 0.7 11/17/2013 0424   LYMPHSABS 1.5 04/04/2013 1125   MONOABS 0.5 11/17/2013 0424   MONOABS 0.2 04/04/2013 1125   EOSABS 0.0 11/17/2013 0424   EOSABS 0.8* 04/04/2013 1125   BASOSABS 0.0 11/17/2013 0424   BASOSABS 0.0 04/04/2013 1125    BMET    Component Value Date/Time   NA 144 11/21/2013 0500   NA 136 03/02/2013 1055   K 3.9 11/21/2013 0500   K 4.2 03/02/2013 1055   CL 110 11/21/2013 0500   CL 108* 08/04/2012 1302   CO2 25 11/21/2013 0500   CO2 22 03/02/2013 1055   GLUCOSE 86 11/21/2013 0500   GLUCOSE 111 03/02/2013 1055   GLUCOSE 164* 08/04/2012 1302   BUN 38* 11/21/2013 0500   BUN 13.8 03/02/2013 1055   CREATININE 0.96 11/21/2013 0500   CREATININE 0.7 03/02/2013 1055   CALCIUM 8.1* 11/21/2013 0500   CALCIUM 9.7 03/02/2013 1055   GFRNONAA 86* 11/21/2013 0500   GFRAA >90 11/21/2013 0500    INR    Component Value Date/Time   INR 1.59* 11/15/2013 1950     Intake/Output Summary (Last 24 hours) at 11/21/13 0826 Last data filed at 11/21/13 0800  Gross per 24 hour   Intake    625 ml  Output   2855 ml  Net  -2230 ml     Assessment:  63 y.o. 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 Will plan on right 4th toe amputation when cardiac status stabilizes.  -Off pressors yesterday.  -Biventricular HF R>L started on lasix gtt last night. Creatinine 0.96 today.  -Leukocytosis: slightly increased to 14.2 today.  -DVT prophylaxis:  Lovenox    Virgina Jock, PA-C [facility] Office: [phone] Pager: [phone] 11/21/2013 8:26 AM   Toe unchanged.  Will consider amputation of this when overall medically stable.  Ruta Hinds, MD [facility] [place] Office: [phone] Pager: [phone]

## 2013-11-21 NOTE — Progress Notes (Signed)
Speech Language Pathology     Patient Details Name: Fernando Lane MRN: [id number] DOB: Jan 13, 1951 Today's Date: 11/21/2013 Time:  -    Pt. Continues with evidence of aspiration; on nectar thick liquids and frequently asking for thin.  May we have orders to perform a FEES to fully assess swallow function and safe ability to upgrade po's?  Please order if agree.   Orbie Pyo [place].Ed [employer] [phone]

## 2013-11-21 NOTE — Progress Notes (Signed)
Pt transferred from [place], Received report from [facility]. Pt and family stated pt has been drinking thin liquids between meals with no thickner. Called Anderson Malta to confirm, she stated Speech therapy, did state this was okay. Pt given cup of water. Neville Walston V, RN 11/21/2013 6:59 PM

## 2013-11-22 DIAGNOSIS — R570 Cardiogenic shock: Secondary | ICD-10-CM

## 2013-11-22 DIAGNOSIS — D45 Polycythemia vera: Secondary | ICD-10-CM

## 2013-11-22 LAB — CULTURE, BLOOD (ROUTINE X 2)
Culture: NO GROWTH
Culture: NO GROWTH

## 2013-11-22 LAB — BASIC METABOLIC PANEL
ANION GAP: 11 (ref 5–15)
BUN: 30 mg/dL — AB (ref 6–23)
CO2: 29 mEq/L (ref 19–32)
Calcium: 8.5 mg/dL (ref 8.4–10.5)
Chloride: 102 mEq/L (ref 96–112)
Creatinine, Ser: 0.86 mg/dL (ref 0.50–1.35)
GFR, EST NON AFRICAN AMERICAN: 90 mL/min — AB (ref >90)
Glucose, Bld: 76 mg/dL (ref 70–99)
POTASSIUM: 3.8 meq/L (ref 3.7–5.3)
Sodium: 142 mEq/L (ref 137–147)

## 2013-11-22 LAB — CARBOXYHEMOGLOBIN
CARBOXYHEMOGLOBIN: 1.6 % — AB (ref 0.5–1.5)
Methemoglobin: 0.8 % (ref 0.0–1.5)
O2 SAT: 74.8 %
Total hemoglobin: 14.1 g/dL (ref 13.5–18.0)

## 2013-11-22 LAB — CBC
HCT: 44.1 % (ref 39.0–52.0)
Hemoglobin: 14 g/dL (ref 13.0–17.0)
MCH: 28.1 pg (ref 26.0–34.0)
MCHC: 31.7 g/dL (ref 30.0–36.0)
MCV: 88.4 fL (ref 78.0–100.0)
PLATELETS: 163 10*3/uL (ref 150–400)
RBC: 4.99 MIL/uL (ref 4.22–5.81)
RDW: 18.2 % — ABNORMAL HIGH (ref 11.5–15.5)
WBC: 14.9 10*3/uL — ABNORMAL HIGH (ref 4.0–10.5)

## 2013-11-22 MED ORDER — OXYCODONE HCL 5 MG PO TABS
5.0000 mg | ORAL_TABLET | ORAL | Status: DC | PRN
  Administered 2013-11-22: 10 mg via ORAL
  Administered 2013-11-22: 5 mg via ORAL
  Administered 2013-11-22 – 2013-12-03 (×22): 10 mg via ORAL
  Filled 2013-11-22 (×22): qty 2
  Filled 2013-11-22: qty 1
  Filled 2013-11-22: qty 2

## 2013-11-22 MED ORDER — ENSURE COMPLETE PO LIQD
237.0000 mL | Freq: Two times a day (BID) | ORAL | Status: DC
  Administered 2013-11-22 – 2013-11-25 (×6): 237 mL via ORAL

## 2013-11-22 MED ORDER — BACITRACIN ZINC 500 UNIT/GM EX OINT
TOPICAL_OINTMENT | Freq: Two times a day (BID) | CUTANEOUS | Status: DC
  Administered 2013-11-22 – 2013-11-24 (×5): 1 via TOPICAL
  Administered 2013-11-24: 23:00:00 via TOPICAL
  Administered 2013-11-25 (×2): 1 via TOPICAL
  Administered 2013-11-26: 18:00:00 via TOPICAL
  Administered 2013-11-26: 1 via TOPICAL
  Administered 2013-11-27 – 2013-11-28 (×4): via TOPICAL
  Administered 2013-11-29: 1 via TOPICAL
  Administered 2013-11-29: 22:00:00 via TOPICAL
  Administered 2013-11-30: 1 via TOPICAL
  Administered 2013-11-30 – 2013-12-04 (×7): via TOPICAL
  Filled 2013-11-22 (×2): qty 28.35

## 2013-11-22 NOTE — Progress Notes (Signed)
SHIFT SUMMARY:  Patient has been complaining of sore to his penile area.  Penis is edematous and has a slit to the side.   Foley catheter is intact.  Patient is on lasix drip, bottom is excoriated.  MD notified and gave an order.   Unable to discontinue foley cath at present. Will continue to monitor.

## 2013-11-22 NOTE — Progress Notes (Signed)
ADVANCED HEART FAILURE ROUNDING NOTE DAILY PROGRESS NOTE  Subjective:  Diuresing with lasix drip, spiro, and metolazone. Weight down 7 pounds. Baseline weight 215 pounds. Evaluated by Speech with recommendations for dysphagia 3 diet.    Advanced Directives conversation initiated 9/09 by Dr Alva Garnet. Advised no CPR/ACLS and DNI or short term intubation only. Palliative Care consultation requested.   Denies SOB/CP  CO-OX 74%    Objective:  Temp:  [97.5 F (36.4 C)-98 F (36.7 C)] 98 F (36.7 C) (09/10 0417) Pulse Rate:  [85-98] 91 (09/10 0417) Resp:  [12-20] 17 (09/10 0417) BP: (91-123)/(62-84) 101/67 mmHg (09/10 0637) SpO2:  [94 %-99 %] 99 % (09/10 0417) Weight:  [253 lb 8.5 oz (115 kg)] 253 lb 8.5 oz (115 kg) (09/10 0500) Weight change: -10.6 oz (-0.3 kg)  Intake/Output from previous day: 09/09 0701 - 09/10 0700 In: 1582 [P.O.:1222; I.V.:360] Out: 7500 [Urine:7500]  Intake/Output from this shift:    Medications: Current Facility-Administered Medications  Medication Dose Route Frequency Provider Last Rate Last Dose  . 0.9 %  sodium chloride infusion  250 mL Intravenous PRN Corey Harold, NP 10 mL/hr at 11/18/13 1000 250 mL at 11/18/13 1000  . albuterol (PROVENTIL) (2.5 MG/3ML) 0.083% nebulizer solution 2.5 mg  2.5 mg Nebulization Q4H PRN Wilhelmina Mcardle, MD      . aspirin tablet 325 mg  325 mg Oral Daily Doree Fudge, MD   325 mg at 11/21/13 1217  . atorvastatin (LIPITOR) tablet 80 mg  80 mg Oral q1800 Doree Fudge, MD   80 mg at 11/21/13 1837  . docusate sodium (COLACE) capsule 100 mg  100 mg Oral Daily PRN Melvenia Needles, NP   100 mg at 11/18/13 0545  . enoxaparin (LOVENOX) injection 55 mg  55 mg Subcutaneous Daily Jolaine Artist, MD   55 mg at 11/21/13 1512  . fentaNYL (SUBLIMAZE) injection 25-50 mcg  25-50 mcg Intravenous Q2H PRN Wilhelmina Mcardle, MD   50 mcg at 11/22/13 0636  . food thickener (THICK IT) powder   Oral PRN Doree Fudge, MD      . furosemide (LASIX) 250 mg in dextrose 5 % 250 mL (1 mg/mL) infusion  15 mg/hr Intravenous Continuous Jolaine Artist, MD 15 mL/hr at 11/22/13 0638 15 mg/hr at 11/22/13 1761  . metolazone (ZAROXOLYN) tablet 2.5 mg  2.5 mg Oral BID Jolaine Artist, MD   2.5 mg at 11/21/13 2117  . potassium chloride (K-DUR) CR tablet 20 mEq  20 mEq Oral BID Jolaine Artist, MD   20 mEq at 11/21/13 2117  . spironolactone (ALDACTONE) tablet 25 mg  25 mg Oral Daily Jolaine Artist, MD   25 mg at 11/21/13 1210    Physical Exam: CVP 8 General:  Chronically ill-appearing. No resp difficulty HEENT: normal Neck: supple. Unable to see JVD due to thick neck. Carotids 2+ bilat; no bruits. RIJ  Cor: RRR distant Lungs: clear anteriorly Abdomen: markedly obese + distended.  Good bowel sounds. Extremities: no cyanosis, clubbing, rash, 3-4+ edema/anasarca. + severe penile and scrotal edema. +severe decubitus ulcers. Gangrenous toes R foot Neuro: alert & orientedx3, cranial nerves grossly intact. moves all 4 extremities w/o difficulty. Affect pleasant   Lab Results: Results for orders placed during the hospital encounter of 11/15/13 (from the past 48 hour(s))  GLUCOSE, CAPILLARY     Status: Abnormal   Collection Time    11/20/13  8:17 AM      Result Value Ref Range  Glucose-Capillary 68 (*) 70 - 99 mg/dL  GLUCOSE, CAPILLARY     Status: None   Collection Time    11/20/13 12:29 PM      Result Value Ref Range   Glucose-Capillary 91  70 - 99 mg/dL  GLUCOSE, CAPILLARY     Status: None   Collection Time    11/20/13  3:44 PM      Result Value Ref Range   Glucose-Capillary 97  70 - 99 mg/dL  GLUCOSE, CAPILLARY     Status: None   Collection Time    11/20/13  7:50 PM      Result Value Ref Range   Glucose-Capillary 83  70 - 99 mg/dL  BASIC METABOLIC PANEL     Status: Abnormal   Collection Time    11/21/13  5:00 AM      Result Value Ref Range   Sodium 144  137 - 147 mEq/L    Potassium 3.9  3.7 - 5.3 mEq/L   Chloride 110  96 - 112 mEq/L   CO2 25  19 - 32 mEq/L   Glucose, Bld 86  70 - 99 mg/dL   BUN 38 (*) 6 - 23 mg/dL   Creatinine, Ser 0.96  0.50 - 1.35 mg/dL   Calcium 8.1 (*) 8.4 - 10.5 mg/dL   GFR calc non Af Amer 86 (*) >90 mL/min   GFR calc Af Amer >90  >90 mL/min   Comment: (NOTE)     The eGFR has been calculated using the CKD EPI equation.     This calculation has not been validated in all clinical situations.     eGFR's persistently <90 mL/min signify possible Chronic Kidney     Disease.   Anion gap 9  5 - 15  CBC     Status: Abnormal   Collection Time    11/21/13  5:00 AM      Result Value Ref Range   WBC 14.2 (*) 4.0 - 10.5 K/uL   RBC 4.87  4.22 - 5.81 MIL/uL   Hemoglobin 13.7  13.0 - 17.0 g/dL   HCT 43.9  39.0 - 52.0 %   MCV 90.1  78.0 - 100.0 fL   MCH 28.1  26.0 - 34.0 pg   MCHC 31.2  30.0 - 36.0 g/dL   RDW 18.4 (*) 11.5 - 15.5 %   Platelets 174  150 - 400 K/uL   Comment: REPEATED TO VERIFY  CARBOXYHEMOGLOBIN     Status: None   Collection Time    11/21/13  5:00 AM      Result Value Ref Range   Total hemoglobin 13.9  13.5 - 18.0 g/dL   O2 Saturation 73.6     Carboxyhemoglobin 1.3  0.5 - 1.5 %   Methemoglobin 0.7  0.0 - 1.5 %  CARBOXYHEMOGLOBIN     Status: Abnormal   Collection Time    11/22/13  4:35 AM      Result Value Ref Range   Total hemoglobin 14.1  13.5 - 18.0 g/dL   O2 Saturation 74.8     Carboxyhemoglobin 1.6 (*) 0.5 - 1.5 %   Methemoglobin 0.8  0.0 - 1.5 %  BASIC METABOLIC PANEL     Status: Abnormal   Collection Time    11/22/13  4:40 AM      Result Value Ref Range   Sodium 142  137 - 147 mEq/L   Potassium 3.8  3.7 - 5.3 mEq/L   Chloride 102  96 -  112 mEq/L   CO2 29  19 - 32 mEq/L   Glucose, Bld 76  70 - 99 mg/dL   BUN 30 (*) 6 - 23 mg/dL   Creatinine, Ser 0.86  0.50 - 1.35 mg/dL   Calcium 8.5  8.4 - 10.5 mg/dL   GFR calc non Af Amer 90 (*) >90 mL/min   GFR calc Af Amer >90  >90 mL/min   Comment: (NOTE)      The eGFR has been calculated using the CKD EPI equation.     This calculation has not been validated in all clinical situations.     eGFR's persistently <90 mL/min signify possible Chronic Kidney     Disease.   Anion gap 11  5 - 15  CBC     Status: Abnormal   Collection Time    11/22/13  4:40 AM      Result Value Ref Range   WBC 14.9 (*) 4.0 - 10.5 K/uL   RBC 4.99  4.22 - 5.81 MIL/uL   Hemoglobin 14.0  13.0 - 17.0 g/dL   HCT 44.1  39.0 - 52.0 %   MCV 88.4  78.0 - 100.0 fL   MCH 28.1  26.0 - 34.0 pg   MCHC 31.7  30.0 - 36.0 g/dL   RDW 18.2 (*) 11.5 - 15.5 %   Platelets 163  150 - 400 K/uL   Comment: PLATELET COUNT CONFIRMED BY SMEAR    Imaging: Imaging results have been reviewed  Assessment: 1. 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  Not LVAD candidate with severe RV failure and gangrene.      --EF approximately 35%, RV severely dilated. Difficult study.  2. Anasarca 3. CAD s/p out-of -hospital LAD infarct 5/15, patient also had likely RV infarction.      --LAD and RCA 100% occluded. Lcx 30% EF 20% -> medical management 4. 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 PAD, severe.  Dry gangrene right foot.  6. Morbid obesity 7. Acute on chronic respiratory failure 8. Severe decubiti 9. C difficile colitis: He has completed treatment.   10. Acute kidney injury, improved 11. Protein-calorie malnutrition  Plan/discussion:  He has massive volume overload in the setting of biventricular HF with R>>>L symptoms. Suspect RHF is multifactorial. [place] diuresis -5.9 liters. CVP better 8. Massive low extremity edema. Continue lasix drip and metolazone.  BP soft will not add BB and Ace. (Prior carvedilol 12.5 mg bid and lisinopril 5 mg daily)  Renal function stable.   Consult PT.   Length of Stay:  LOS: 7 days  CLEGG,AMY NP-C  11/22/2013, 7:10 AM  Patient seen with NP, agree with the above note.  Ischemic  cardiomyopathy with EF roughly 30% (difficult study) and RV failure.  He remains significantly volume overloaded.  Continue Lasix gtt and metolazone at current doses, he diuresed well yesterday.  Eventually he will need toe amputation and eventually would like to get him back on carvedilol and lisinopril.  Creatinine stable so far with diuresis.   Loralie Champagne 11/22/2013 9:25 AM

## 2013-11-22 NOTE — Progress Notes (Signed)
NUTRITION FOLLOW UP  INTERVENTION: Ensure Complete po BID, each supplement provides 350 kcal and 13 grams of protein RD to follow for nutrition care plan  NUTRITION DIAGNOSIS: Inadequate oral intake now related to poor appetite, dysphagia as evidenced by PO intake 10-20%, ongoing  Goal: Pt to meet >/= 90% of their estimated nutrition needs, unmet  Monitor:  PO & supplemental intake, weight, labs, I/O's  ASSESSMENT: 63 yo male presented to [facility] ER with dyspnea and septic shock >> needed intubation. Recently Dx with C diff, and has dry gangrene of Rt foot. Transferred to [facility] for further tx. Work-up at [facility] ED was concerning for sepsis vs CHF exacerbation.   Patient extubated 9/4.  Transferred to 2C-Stepdown from 74M-MICU 9/9.  Patient s/p bedside swallow evaluation 9/5.  Pt demonstrated evidence of an acute and reversable dysphagia likely due to short term intubation.  Diet advanced to Dys 3-nectar thick liquids.  PO intake poor at 10-20% per flowsheet records.  Would benefit from addition of oral nutrition supplements.  RD to order.  Height: Ht Readings from Last 1 Encounters:  11/16/13 5\' 6"  (1.676 m)    Weight: Wt Readings from Last 1 Encounters:  11/22/13 253 lb 8.5 oz (115 kg)    BMI:  Body mass index is 40.94 kg/(m^2).   Estimated Nutritional Needs: Kcal: 2000-2200 Protein: 120-130 gm Fluid: 2-2.2 L  Skin: pressure ulcer (DTI) to sacrum; diabetic ulcer to right leg; black toes on right foot  Diet Order: Dysphagia 3, nectar thick liquids   Intake/Output Summary (Last 24 hours) at 11/22/13 1120 Last data filed at 11/22/13 0900  Gross per 24 hour  Intake   1137 ml  Output   8600 ml  Net  -7463 ml    Labs:   Recent Labs Lab 11/20/13 0500 11/21/13 0500 11/22/13 0440  NA 144 144 142  K 3.4* 3.9 3.8  CL 109 110 102  CO2 24 25 29   BUN 47* 38* 30*  CREATININE 1.24 0.96 0.86  CALCIUM 8.2* 8.1* 8.5  GLUCOSE 86 86 76    CBG (last 3)   Recent  Labs  11/20/13 1229 11/20/13 1544 11/20/13 1950  GLUCAP 91 97 83    Scheduled Meds: . aspirin  325 mg Oral Daily  . atorvastatin  80 mg Oral q1800  . enoxaparin (LOVENOX) injection  55 mg Subcutaneous Daily  . metolazone  2.5 mg Oral BID  . potassium chloride  20 mEq Oral BID  . spironolactone  25 mg Oral Daily    Continuous Infusions: . furosemide (LASIX) infusion 15 mg/hr (11/22/13 1610)    Past Medical History  Diagnosis Date  . Chronic low back pain   . 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 Chronic neck pain   . OSA (obstructive sleep apnea)     CPAP @ bedtime  . Allergy   . Unspecified essential hypertension     Resistant, 2D Echo - EF-55-60  . Polycythemia   . CAD (coronary artery disease)     Vessel type unspecified  . PVD (peripheral vascular disease)   . DDD (degenerative disc disease), cervical     Past Surgical History  Procedure Laterality Date  . Lower extrmity doppler      [place] 2008; no evidence for PAD  . US echocardiography  11/2008    Normal valves, mild LAE  . Left knee replacement  10/2006  . Cervical spine surgery  02/2008  . Joint replacement    .  Spine surgery      Arthur Holms, RD, LDN Pager #: [phone] After-Hours Pager #: [phone]

## 2013-11-22 NOTE — Progress Notes (Signed)
Speech Language Pathology Treatment: Dysphagia  Patient Details Name: Fernando Lane MRN: [id number] DOB: Apr 13, 1950 Today's Date: 11/22/2013 Time: 6004-5997 SLP Time Calculation (min): 14 min  Assessment / Plan / Recommendation Clinical Impression  Pt.seen for skilled dysphagia treatment to determine safety of liquid upgrade.  No indications penetration/aspiration with straw sip thin (although session brief).  Oral prep, control and transit functional with min verbal reminders and clinical reasoning for small sips.  SLP recommends upgrade to thin liquids, continue Dys 3 (will assess for solid upgrade next date)   HPI HPI: 63 year old male with PMH:  polycythemia vera, OSA (nocturnal CPAP), CHF, and CAD from SNF aditted with SOB. Intubated 9/3-9/4. CXR stable bibasilar opacities and layering effusions. Low lung volumes. Cardiomegaly, vascular congestion.  Found to have hypovolemia, sepsis, cardiogenic.   Pertinent Vitals Pain Assessment: No/denies pain  SLP Plan  Continue with current plan of care    Recommendations Diet recommendations: Dysphagia 3 (mechanical soft);Thin liquid Liquids provided via: Cup;Straw Medication Administration: Whole meds with puree Supervision: Patient able to self feed;Intermittent supervision to cue for compensatory strategies Compensations: Slow rate;Small sips/bites Postural Changes and/or Swallow Maneuvers: Seated upright 90 degrees              Oral Care Recommendations: Oral care BID Follow up Recommendations: None Plan: Continue with current plan of care    GO     Houston Siren 11/22/2013, 2:18 PM  Orbie Pyo Colvin Caroli.Ed [employer] [phone]

## 2013-11-22 NOTE — Consult Note (Signed)
Diuresing.  No significant change in right foot.  4th toe dry gangrene.  Lateral granulating ulcer right distal calf 3-4 cm.  Overall foot is stable and needs to be improved from cardiac standpoint.  Will recheck next week.  Call if questions.  Ruta Hinds, MD [facility] Office: [phone] Pager: [phone]

## 2013-11-22 NOTE — Progress Notes (Signed)
[place] TEAM 1 - Stepdown/ICU TEAM Progress Note  DYLLIN GULLEY AVW:[id number] DOB: 1950/04/28 DOA: 11/15/2013 PCP: Gwendolyn Grant, MD  Admit HPI / Brief Narrative: 63 year old WM PMHx polycythemia vera, OSA on nocturnal CPAP, CHF, and CAD (2 vessel disease with medical management only). He resides at [facility] where he had developed SOB 9/3 am. He was transported to [facility] ED where his workup was concerning for sepsis vs CHF exacerbation. He was placed on BiPAP initially, but eventually required intubation. He received 4 liters of NS. He was then transferred to [facility] for ICU admission.    HPI/Subjective: 9/10 stay patient states is baseline weight is 235 pounds, currently bed weight= 245 pounds (111.5 kg), states not on O2 except at night with the CPAP. Currently has some SOB, negative CP.  Assessment/Plan: Cardiogenic shock; volume overload  - multifactorial, resolved; Mildly elevated trop I (peak 2.63 on 9/04)  -Inoperable CAD  -Recent STEMI 08/07/13, medical management recommended -Continue diuresis per cardiology. Currently on Lasix drip 15 mg/hr -Continue aspirin 325 mg daily -Continue Zaroxolyn 2.5 mg BID -Continue spironolactone 25 mg  Acute on chronic biventricular heart failure -See cardiogenic shock/volume overload -Daily weights -Strict in and out -Monitor electrolytes daily  CAD  -s/p out-of -hospital LAD infarct 5/15, patient also had likely RV infarction.  -LAD and RCA 100% occluded. 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 goal > 4; check daily -Check magnesium daily recurrent   Dysphagia of unclear etiology  -Continue dysphagia 3 diet per speech recommendation    Polycythemia vera  -Patient sees Dr. Concha Norway (oncology) -(baseline plt count 800k) ; currently within normal limits will continue to monitor. -Patient has been afebrile, leukocytosis part of his PCV (WBC baseline ~appears to be 15+) -Consult Dr. Juliann Mule if cell lines increased to previous abnormal levels.  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 Loralie Champagne (cardiology; heart failure) Dr. Merton [facility].) Dr. Ruta Hinds (vascular surgery)    Procedure/Significant Events: 9/3 Intubated in [facility] ED, transferred to [facility]  9/4 Extubated. Trop I elevated @ 2.63  9/04 WOC consult: Entire buttocks and posterior scrotum with erythremia and patchy areas of skin loss. Right foot is purple and mottled, some toes with dry intact eschar. Barrier cream to scrotum and buttocks to protect, repel moisture, and promote healing. Foam dressing to outer leg wound. If aggressive plan of care is desired to right foot, please consult VVS team for further plan of care.  9/04 VVS Consult: This is a 63 y.o. 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  -foot with gangrene, but doubt this is the source of his sepsis. Plavix discontinued in anticipation for possible surgery  9/4 TTE: Limited study, akinetic apex with unusual appearance, cannot rule out distal muscular VSD, mildly dilated  RA, severely dilated RV, moderately dilated RA moderate TR, no RVSP estimate given  9/05 Cards Consult: consider cardiac MRI to further elucidate the abnormal LV anatomy  and clarify cause of RV dilation  9/07 Weaning off vasopressors  9/08 Off vasopressors. Diuresis ordered for severe hypervolemia. Discussed with Dr Debara Pickett re: Echo findings of severe RV dilation. Consider RHC when more stabilized.  9/08 Urology consult: see full note. Rec leaving Foley cath until penile and scrotal edema resolve  9/08 PM: Furosemide gtt initiated by Cardiology  9/09 Advanced Directives conversation initiated 9/09 by Dr Alva Garnet. Advised no CPR/ACLS and DNI or short term intubation only. Palliative Care consultation requested. Transfer to SDU ordered. TRH to assume care as of AM 9/10 and PCCM to sign off    Culture MRSA PCR 9/03 >> POS  Resp 9/03 >> NOF  Urine 9/03 >> NEG  Blood 9/04 >> NEG  Antibiotics: Vanc 9/03 >> 9/07  Pip-tazo 9/03 >> 9/07  fidaxomicin 8/31 >> 9/09  DVT prophylaxis: Lovenox   Devices    LINES / TUBES:  ETT 9/03 >> 9/04  R IJ CVL 9/03 >>      Continuous Infusions: . furosemide (LASIX) infusion 15 mg/hr (11/22/13 3149)    Objective: VITAL SIGNS: Temp: 97.5 F (36.4 C) (09/10 0823) Temp src: Oral (09/10 0823) BP: 95/69 mmHg (09/10 0823) Pulse Rate: 92 (09/10 0823) SPO2; FIO2:   Intake/Output Summary (Last 24 hours) at 11/22/13 1038 Last data filed at 11/22/13 0900  Gross per 24 hour  Intake   1152 ml  Output   8600 ml  Net  -7448 ml     Exam: General: A./O. x4, NAD, No acute respiratory distress Lungs: Clear to auscultation bilaterally without wheezes or crackles Cardiovascular: Regular rate and rhythm without murmur gallop or rub normal S1 and S2 Abdomen: Nontender, nondistended, (anasarca) soft, bowel sounds positive, no rebound, no ascites, no appreciable mass Skin; sacral excoriation covered with clean dressing. Extremities: Fourth right metatarsal dry gangrene, right lower extremity lateral aspect of calf ~5 cm in diameter ulcer with purulent discharge, No significant cyanosis, clubbing, or edema bilateral lower  extremities, (anasarca), 2-3+ bilateral pedal edema to hips   Data Reviewed: Basic Metabolic Panel:  Recent Labs Lab 11/18/13 0403 11/19/13 0500 11/20/13 0500 11/21/13 0500 11/22/13 0440  NA 139 142 144 144 142  K 3.3* 3.7 3.4* 3.9 3.8  CL 104 107 109 110 102  CO2 22 22 24 25 29   GLUCOSE 125* 109* 86 86 76  BUN 56* 52* 47* 38* 30*  CREATININE 1.97* 1.59* 1.24 0.96 0.86  CALCIUM 7.8* 8.2* 8.2* 8.1* 8.5   Liver Function Tests:  Recent Labs Lab 11/15/13 1950 11/16/13 0500 11/17/13 0424 11/20/13 0500  AST 19 20 12 8   ALT 14 13 14 9   ALKPHOS 118* 113 87 102  BILITOT 1.0 0.9 0.9 0.6  PROT 5.1* 5.3* 4.9* 5.0*  ALBUMIN 2.4* 2.5* 2.4* 2.4*    Recent Labs Lab 11/15/13 1950  LIPASE 7*  AMYLASE 151*   No results found for this basename: AMMONIA,  in the last 168 hours CBC:  Recent Labs Lab 11/15/13 1950  11/17/13 0424 11/18/13 0403 11/19/13 0500 11/20/13 0500 11/21/13 0500 11/22/13 0440  WBC 47.1*  < > 24.6* 23.5* 19.9* 13.8* 14.2* 14.9*  NEUTROABS 45.3*  --  23.4*  --   --   --   --   --   HGB 15.6  < > 14.1 13.9 14.4 14.0 13.7 14.0  HCT  48.8  < > 43.8 44.2 45.2 44.2 43.9 44.1  MCV 91.4  < > 88.3 89.1 88.1 89.8 90.1 88.4  PLT 829*  < > 457* 413* 324 199 174 163  < > = values in this interval not displayed. Cardiac Enzymes:  Recent Labs Lab 11/15/13 1951 11/16/13 0957 11/16/13 1557 11/16/13 2221  TROPONINI 0.75* 2.63* 2.07* 1.98*   BNP (last 3 results)  Recent Labs  08/07/13 1600 08/23/13 1759 11/15/13 1951  PROBNP 13375.0* 9383.0* 9486.0*   CBG:  Recent Labs Lab 11/19/13 1610 11/20/13 0817 11/20/13 1229 11/20/13 1544 11/20/13 1950  GLUCAP 94 68* 91 97 83    Recent Results (from the past 240 hour(s))  MRSA PCR SCREENING     Status: Abnormal   Collection Time    11/15/13  6:02 PM      Result Value Ref Range Status   MRSA by PCR POSITIVE (*) NEGATIVE Final   Comment:            The GeneXpert MRSA Assay (FDA     approved for NASAL  specimens     only), is one component of a     comprehensive MRSA colonization     surveillance program. It is not     intended to diagnose MRSA     infection nor to guide or     monitor treatment for     MRSA infections.     RESULT CALLED TO, READ BACK BY AND VERIFIED WITH:     D.TURNER AT 2106 11/15/13  CULTURE, BLOOD (ROUTINE X 2)     Status: None   Collection Time    11/15/13  8:37 PM      Result Value Ref Range Status   Specimen Description BLOOD LEFT ARM   Final   Special Requests BOTTLES DRAWN AEROBIC ONLY 8CC   Final   Culture  Setup Time     Final   Value: 11/16/2013 04:13     Performed at [facility]   Culture     Final   Value: NO GROWTH 5 DAYS     Performed at [facility]   Report Status 11/22/2013 FINAL   Final  URINE CULTURE     Status: None   Collection Time    11/15/13 10:32 PM      Result Value Ref Range Status   Specimen Description URINE, CATHETERIZED   Final   Special Requests NONE   Final   Culture  Setup Time     Final   Value: 11/16/2013 00:55     Performed at [facility]     Final   Value: 3,000 COLONIES/ML     Performed at [facility]   Culture     Final   Value: INSIGNIFICANT GROWTH     Performed at [facility]   Report Status 11/17/2013 FINAL   Final  CULTURE, RESPIRATORY (NON-EXPECTORATED)     Status: None   Collection Time    11/15/13 11:30 PM      Result Value Ref Range Status   Specimen Description TRACHEAL ASPIRATE   Final   Special Requests NONE   Final   Gram Stain     Final   Value: MODERATE WBC PRESENT,BOTH PMN AND MONONUCLEAR     RARE SQUAMOUS EPITHELIAL CELLS PRESENT     RARE GRAM POSITIVE COCCI     IN PAIRS RARE GRAM POSITIVE RODS     Performed at  [facility]     Final   Value: Non-Pathogenic Oropharyngeal-type Flora Isolated.     Performed at [facility]   Report Status 11/18/2013 FINAL   Final  CULTURE, BLOOD (ROUTINE X 2)     Status:  None   Collection Time    11/16/13  8:45 AM      Result Value Ref Range Status   Specimen Description BLOOD LEFT ANTECUBITAL   Final   Special Requests BOTTLES DRAWN AEROBIC ONLY 4CC   Final   Culture  Setup Time     Final   Value: 11/16/2013 14:27     Performed at [facility]   Culture     Final   Value: NO GROWTH 5 DAYS     Performed at [facility]   Report Status 11/22/2013 FINAL   Final     Studies:  Recent x-ray studies have been reviewed in detail by the Attending Physician  Scheduled Meds:  Scheduled Meds: . aspirin  325 mg Oral Daily  . atorvastatin  80 mg Oral q1800  . enoxaparin (LOVENOX) injection  55 mg Subcutaneous Daily  . metolazone  2.5 mg Oral BID  . potassium chloride  20 mEq Oral BID  . spironolactone  25 mg Oral Daily    Time spent on care of this patient: 40 mins   Allie Bossier , MD   [facility] Office  [phone] Pager [phone]  On-Call/Text Page:      [email or web address]      password [email or web address]  If 7PM-7AM, please contact night-coverage [email or web address] Password [email or web address] 11/22/2013, 10:38 AM   LOS: 7 days

## 2013-11-23 DIAGNOSIS — I5043 Acute on chronic combined systolic (congestive) and diastolic (congestive) heart failure: Secondary | ICD-10-CM

## 2013-11-23 DIAGNOSIS — I2510 Atherosclerotic heart disease of native coronary artery without angina pectoris: Secondary | ICD-10-CM

## 2013-11-23 DIAGNOSIS — A419 Sepsis, unspecified organism: Secondary | ICD-10-CM

## 2013-11-23 DIAGNOSIS — R6521 Severe sepsis with septic shock: Secondary | ICD-10-CM

## 2013-11-23 DIAGNOSIS — R6520 Severe sepsis without septic shock: Secondary | ICD-10-CM

## 2013-11-23 LAB — CARBOXYHEMOGLOBIN
Carboxyhemoglobin: 1.9 % — ABNORMAL HIGH (ref 0.5–1.5)
Methemoglobin: 0.5 % (ref 0.0–1.5)
O2 SAT: 68.1 %
TOTAL HEMOGLOBIN: 14.8 g/dL (ref 13.5–18.0)

## 2013-11-23 LAB — BASIC METABOLIC PANEL
Anion gap: 11 (ref 5–15)
BUN: 25 mg/dL — ABNORMAL HIGH (ref 6–23)
CALCIUM: 9 mg/dL (ref 8.4–10.5)
CO2: 37 mEq/L — ABNORMAL HIGH (ref 19–32)
CREATININE: 0.83 mg/dL (ref 0.50–1.35)
Chloride: 95 mEq/L — ABNORMAL LOW (ref 96–112)
GFR calc Af Amer: >90 mL/min (ref >90)
GFR calc non Af Amer: >90 mL/min (ref >90)
GLUCOSE: 81 mg/dL (ref 70–99)
Potassium: 4.1 mEq/L (ref 3.7–5.3)
Sodium: 143 mEq/L (ref 137–147)

## 2013-11-23 LAB — MAGNESIUM: Magnesium: 1.4 mg/dL — ABNORMAL LOW (ref 1.5–2.5)

## 2013-11-23 MED ORDER — SENNOSIDES-DOCUSATE SODIUM 8.6-50 MG PO TABS
1.0000 | ORAL_TABLET | Freq: Every day | ORAL | Status: DC
  Administered 2013-11-24 – 2013-12-03 (×9): 1 via ORAL
  Filled 2013-11-23 (×12): qty 1

## 2013-11-23 MED ORDER — GERHARDT'S BUTT CREAM
TOPICAL_CREAM | Freq: Two times a day (BID) | CUTANEOUS | Status: DC
  Administered 2013-11-24 – 2013-11-26 (×4): via TOPICAL
  Administered 2013-11-26: 1 via TOPICAL
  Administered 2013-11-27 – 2013-11-30 (×7): via TOPICAL
  Administered 2013-11-30: 1 via TOPICAL
  Administered 2013-12-01 – 2013-12-04 (×7): via TOPICAL
  Filled 2013-11-23 (×2): qty 1

## 2013-11-23 MED ORDER — MAGNESIUM SULFATE 40 MG/ML IJ SOLN
2.0000 g | Freq: Once | INTRAMUSCULAR | Status: AC
  Administered 2013-11-23: 2 g via INTRAVENOUS
  Filled 2013-11-23: qty 50

## 2013-11-23 MED ORDER — LISINOPRIL 2.5 MG PO TABS
2.5000 mg | ORAL_TABLET | Freq: Two times a day (BID) | ORAL | Status: DC
  Administered 2013-11-23 – 2013-11-24 (×3): 2.5 mg via ORAL
  Filled 2013-11-23 (×7): qty 1

## 2013-11-23 MED ORDER — BISACODYL 10 MG RE SUPP: 10.0000 mg | Freq: Every day | RECTAL | Status: DC | PRN

## 2013-11-23 NOTE — Progress Notes (Signed)
  Physical Therapy Note  Informed by Hoyle Sauer, RN of repeat PT order and physician desire for pt to get OOB today. Pt is already being followed by PT and on last visit was unable to stand x 3 attempts and that physical therapist recommended use of lift equipment for any OOB transfers. Recommended RN use Maxi-move for OOB today, if appropriate. PT will continue to follow patient at frequency of 2days/week.    11/23/2013 Barry Brunner, PT Pager: [phone]

## 2013-11-23 NOTE — Progress Notes (Signed)
Clinical Social Work Department CLINICAL SOCIAL WORK PLACEMENT NOTE 11/23/2013  Patient:  Fernando Lane, Fernando Lane  Account Number:  [id number] Admit date:  11/15/2013  Clinical Social Worker:  Berton Mount, Latanya Presser  Date/time:  11/23/2013 03:00 PM  Clinical Social Work is seeking post-discharge placement for this patient at the following level of care:   [place]   (*CSW will update this form in Epic as items are completed)   11/23/2013  Patient/family provided with [facility] Department of Clinical Social Work's list of facilities offering this level of care within the geographic area requested by the patient (or if unable, by the patient's family).  11/23/2013  Patient/family informed of their freedom to choose among providers that offer the needed level of care, that participate in Medicare, Medicaid or managed care program needed by the patient, have an available bed and are willing to accept the patient.  11/23/2013  Patient/family informed of [facility]' ownership interest in [facility], as well as of the fact that they are under no obligation to receive care at this facility.  PASARR submitted to EDS on Existing PASARR number received on   FL2 transmitted to all facilities in geographic area requested by pt/family on  11/23/2013 FL2 transmitted to all facilities within larger geographic area on   Patient informed that his/her managed care company has contracts with or will negotiate with  certain facilities, including the following:     Patient/family informed of bed offers received:   Patient chooses bed at  Physician recommends and patient chooses bed at    Patient to be transferred to  on   Patient to be transferred to facility by  Patient and family notified of transfer on  Name of family member notified:    The following physician request were entered in Epic:   Additional Comments:   [place], [place]

## 2013-11-23 NOTE — Progress Notes (Signed)
RT placed patient on CPAP of 10 cmH2O with 4 L of O2 bled in. Patient is tolerating at this time with vitals WNL.Marland Kitchen  RT will continue to monitor.   11/23/13 0009  BiPAP/CPAP/SIPAP  BiPAP/CPAP/SIPAP Pt Type Adult  Mask Type Full face mask  Mask Size Large  Respiratory Rate 14 breaths/min  EPAP 10 cmH2O  Flow Rate 4 lpm  CPAP 10 cmH2O  BiPAP/CPAP/SIPAP CPAP  Patient Home Equipment No  Auto Titrate No  BiPAP/CPAP /SiPAP Vitals  Pulse Rate 93  Resp 14  SpO2 95 %  Bilateral Breath Sounds Clear

## 2013-11-23 NOTE — Progress Notes (Signed)
ADVANCED HEART FAILURE ROUNDING NOTE DAILY PROGRESS NOTE  Subjective:  Diuresing with lasix drip, spiro, and metolazone. Weight down another 13 pounds.  Baseline weight 215 pounds. Evaluated by Speech with recommendations for dysphagia 3 diet.  RUE edema.    Advanced Directives conversation initiated 9/09 by Dr Alva Garnet. Advised no CPR/ACLS and DNI or short term intubation only. Palliative Care consultation requested.   Denies SOB/CP  CO-OX 68%    Objective:  Temp:  [97.4 F (36.3 C)-97.8 F (36.6 C)] 97.5 F (36.4 C) (09/11 0422) Pulse Rate:  [87-106] 90 (09/11 0422) Resp:  [11-20] 14 (09/11 0422) BP: (95-134)/(66-85) 122/73 mmHg (09/11 0422) SpO2:  [93 %-100 %] 96 % (09/11 0422) Weight:  [240 lb (108.863 kg)] 240 lb (108.863 kg) (09/11 0422) Weight change: -13 lb 8.5 oz (-6.137 kg)  Intake/Output from previous day: 09/10 0701 - 09/11 0700 In: 150 [I.V.:150] Out: 8350 [Urine:8350]  Intake/Output from this shift:    Medications: Current Facility-Administered Medications  Medication Dose Route Frequency Provider Last Rate Last Dose  . 0.9 %  sodium chloride infusion  250 mL Intravenous PRN Corey Harold, NP 10 mL/hr at 11/18/13 1000 250 mL at 11/18/13 1000  . albuterol (PROVENTIL) (2.5 MG/3ML) 0.083% nebulizer solution 2.5 mg  2.5 mg Nebulization Q4H PRN Wilhelmina Mcardle, MD      . aspirin tablet 325 mg  325 mg Oral Daily Doree Fudge, MD   325 mg at 11/22/13 0924  . atorvastatin (LIPITOR) tablet 80 mg  80 mg Oral q1800 Doree Fudge, MD   80 mg at 11/22/13 1646  . bacitracin ointment   Topical BID Allie Bossier, MD   1 application at 66/59/93 2108  . docusate sodium (COLACE) capsule 100 mg  100 mg Oral Daily PRN Melvenia Needles, NP   100 mg at 11/18/13 0545  . enoxaparin (LOVENOX) injection 55 mg  55 mg Subcutaneous Daily Jolaine Artist, MD   55 mg at 11/22/13 5701  . feeding supplement (ENSURE COMPLETE) (ENSURE COMPLETE) liquid 237 mL  237  mL Oral BID BM Rogue Bussing, RD   237 mL at 11/22/13 1314  . fentaNYL (SUBLIMAZE) injection 25-50 mcg  25-50 mcg Intravenous Q2H PRN Wilhelmina Mcardle, MD   50 mcg at 11/22/13 1308  . food thickener (THICK IT) powder   Oral PRN Doree Fudge, MD      . furosemide (LASIX) 250 mg in dextrose 5 % 250 mL (1 mg/mL) infusion  15 mg/hr Intravenous Continuous Jolaine Artist, MD 15 mL/hr at 11/22/13 0638 15 mg/hr at 11/22/13 7793  . metolazone (ZAROXOLYN) tablet 2.5 mg  2.5 mg Oral BID Jolaine Artist, MD   2.5 mg at 11/22/13 2106  . oxyCODONE (Oxy IR/ROXICODONE) immediate release tablet 5-10 mg  5-10 mg Oral Q4H PRN Samella Parr, NP   10 mg at 11/23/13 0653  . potassium chloride (K-DUR) CR tablet 20 mEq  20 mEq Oral BID Jolaine Artist, MD   20 mEq at 11/22/13 2106  . spironolactone (ALDACTONE) tablet 25 mg  25 mg Oral Daily Jolaine Artist, MD   25 mg at 11/22/13 9030    Physical Exam: CVP 10 General:  Chronically ill-appearing. No resp difficulty HEENT: normal Neck: supple. Unable to see JVD due to thick neck. Carotids 2+ bilat; no bruits. RIJ  Cor: RRR distant Lungs: clear anteriorly Abdomen: markedly obese + distended.  Good bowel sounds. Extremities: no cyanosis, clubbing, rash, 3-4+ edema/anasarca. +  severe penile and scrotal edema. +severe decubitus ulcers. Gangrenous toes R foot  RUE edema  Neuro: alert & orientedx3, cranial nerves grossly intact. moves all 4 extremities w/o difficulty. Affect pleasant   Lab Results: Results for orders placed during the hospital encounter of 11/15/13 (from the past 48 hour(s))  CARBOXYHEMOGLOBIN     Status: Abnormal   Collection Time    11/22/13  4:35 AM      Result Value Ref Range   Total hemoglobin 14.1  13.5 - 18.0 g/dL   O2 Saturation 74.8     Carboxyhemoglobin 1.6 (*) 0.5 - 1.5 %   Methemoglobin 0.8  0.0 - 1.5 %  BASIC METABOLIC PANEL     Status: Abnormal   Collection Time    11/22/13  4:40 AM      Result Value  Ref Range   Sodium 142  137 - 147 mEq/L   Potassium 3.8  3.7 - 5.3 mEq/L   Chloride 102  96 - 112 mEq/L   CO2 29  19 - 32 mEq/L   Glucose, Bld 76  70 - 99 mg/dL   BUN 30 (*) 6 - 23 mg/dL   Creatinine, Ser 0.86  0.50 - 1.35 mg/dL   Calcium 8.5  8.4 - 10.5 mg/dL   GFR calc non Af Amer 90 (*) >90 mL/min   GFR calc Af Amer >90  >90 mL/min   Comment: (NOTE)     The eGFR has been calculated using the CKD EPI equation.     This calculation has not been validated in all clinical situations.     eGFR's persistently <90 mL/min signify possible Chronic Kidney     Disease.   Anion gap 11  5 - 15  CBC     Status: Abnormal   Collection Time    11/22/13  4:40 AM      Result Value Ref Range   WBC 14.9 (*) 4.0 - 10.5 K/uL   RBC 4.99  4.22 - 5.81 MIL/uL   Hemoglobin 14.0  13.0 - 17.0 g/dL   HCT 44.1  39.0 - 52.0 %   MCV 88.4  78.0 - 100.0 fL   MCH 28.1  26.0 - 34.0 pg   MCHC 31.7  30.0 - 36.0 g/dL   RDW 18.2 (*) 11.5 - 15.5 %   Platelets 163  150 - 400 K/uL   Comment: PLATELET COUNT CONFIRMED BY SMEAR  CARBOXYHEMOGLOBIN     Status: Abnormal   Collection Time    11/23/13  5:00 AM      Result Value Ref Range   Total hemoglobin 14.8  13.5 - 18.0 g/dL   O2 Saturation 68.1     Carboxyhemoglobin 1.9 (*) 0.5 - 1.5 %   Methemoglobin 0.5  0.0 - 1.5 %  BASIC METABOLIC PANEL     Status: Abnormal   Collection Time    11/23/13  5:00 AM      Result Value Ref Range   Sodium 143  137 - 147 mEq/L   Potassium 4.1  3.7 - 5.3 mEq/L   Chloride 95 (*) 96 - 112 mEq/L   CO2 37 (*) 19 - 32 mEq/L   Glucose, Bld 81  70 - 99 mg/dL   BUN 25 (*) 6 - 23 mg/dL   Creatinine, Ser 0.83  0.50 - 1.35 mg/dL   Calcium 9.0  8.4 - 10.5 mg/dL   GFR calc non Af Amer >90  >90 mL/min   GFR calc Af Amer >90  >  90 mL/min   Comment: (NOTE)     The eGFR has been calculated using the CKD EPI equation.     This calculation has not been validated in all clinical situations.     eGFR's persistently <90 mL/min signify possible  Chronic Kidney     Disease.   Anion gap 11  5 - 15  MAGNESIUM     Status: Abnormal   Collection Time    11/23/13  5:00 AM      Result Value Ref Range   Magnesium 1.4 (*) 1.5 - 2.5 mg/dL    Imaging: Imaging results have been reviewed  Assessment: 1. 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  Not LVAD candidate with severe RV failure and gangrene.      --EF approximately 35%, RV severely dilated. Difficult study.  2. Anasarca 3. CAD s/p out-of -hospital LAD infarct 5/15, patient also had likely RV infarction.      --LAD and RCA 100% occluded. Lcx 30% EF 20% -> medical management 4. 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 PAD, severe.  Dry gangrene right foot.  6. Morbid obesity 7. Acute on chronic respiratory failure 8. Severe decubiti 9. C difficile colitis: He has completed treatment.   10. Acute kidney injury, improved 11. Protein-calorie malnutrition 12. RUE Edema.   Plan/discussion:  He has massive volume overload in the setting of biventricular HF with R>>>L symptoms. Suspect RHF is multifactorial. [place] diuresis -8.2 liters. CVP 10. Baseline weight 215 pounds. Massive low extremity edema. Continue lasix drip and metolazone.  BP better. Will  add 2.5 mg lisinopril twice a day. Hopefully can add BB soon. (Home dose carvedilol 12.5 mg bid and lisinopril 5 mg daily) . Renal function stable.   Consult PT.   Length of Stay:  LOS: 8 days  CLEGG,AMY NP-C  11/23/2013, 7:08 AM  Patient seen with NP, agree with the above note.  He diuresed well yesterday.  Weight is down.  CVP remains 10.  He needs more diuresis.  Continue current regimen today.  I will restart lisinopril at 2.5 mg bid and will potentially restart low dose Coreg tomorrow if he remains stable.  Needs to get out of bed.   Loralie Champagne 11/23/2013 7:31 AM

## 2013-11-23 NOTE — Consult Note (Signed)
Palliative Medicine Team at [facility] Note Date: 11/23/2013   Patient Name: Fernando Lane  DOB: 1950-10-30  MRN: [id number]  Age / Sex: 63 y.o., male   PCP: Rowe Clack, MD Referring Physician: Delfina Redwood, MD  Active Problems: Active Problems:   CORONARY ARTERY DISEASE, VESSEL TYPE UNSPECIFIED   Atherosclerosis of native arteries of the extremities with ulceration(440.23)   Septic shock   Acute respiratory failure   Pneumonia   C. difficile colitis   AKI (acute kidney injury)   Dry gangrene   Right ventricular dysfunction   Cardiomyopathy, ischemic   Shock circulatory   Hypervolemia   RVF (right ventricular failure)   Acute on chronic systolic heart failure   Right heart failure   HPI/Reason for Consultation: Fernando Lane is a 63 y.o. Patient with acute on chronic CHF, admitted with respiratory failure and right toe gangrene. PMT consulted for goals of care and advance directive planning.  Primary Contacts: HCPOA: No, will contact:  in process of completing paperwork @CHLEC1NM @@EC1HPH @ @EC1WPH @  Advance Directive: Information given    Code Status Orders        Start     Ordered   11/15/13 1920  Full code   Continuous     11/15/13 1923      I have reviewed the medical record, interviewed the patient and family, and examined the patient. The following aspects are pertinent.  Past Medical History  Diagnosis Date  . Chronic low back pain   . 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 Chronic neck pain   . OSA (obstructive sleep apnea)     CPAP @ bedtime  . Allergy   . Unspecified essential hypertension     Resistant, 2D Echo - EF-55-60  . Polycythemia   . CAD (coronary artery disease)     Vessel type unspecified  . PVD (peripheral vascular disease)   . DDD (degenerative disc disease), cervical     History   Social History  . Marital Status: Divorced    Spouse Name: N/A    Number  of Children: 1  . Years of Education: N/A   Occupational History  .     Social History Main Topics  . Smoking status: Former Smoker -- 0 years    Types: Cigarettes, Pipe    Quit date: 03/15/1968  . Smokeless tobacco: Former Systems developer     Comment: Pt quit Cigarettes 1970's- and cigars & chew 1990  . Alcohol Use: 3.6 oz/week    6 Cans of beer per week  . Drug Use: No  . Sexual Activity: Not on file   Other Topics Concern  . Not on file   Social History Narrative   Divorced, lives alone. Daughter lives in town. Disable/Former management/construction    Family History  Problem Relation Age of Onset  . Heart attack Father     Scheduled Meds: . aspirin  325 mg Oral Daily  . atorvastatin  80 mg Oral q1800  . bacitracin   Topical BID  . enoxaparin (LOVENOX) injection  55 mg Subcutaneous Daily  . feeding supplement (ENSURE COMPLETE)  237 mL Oral BID BM  . Gerhardt's butt cream   Topical BID  . lisinopril  2.5 mg Oral BID  . metolazone  2.5 mg Oral BID  . potassium chloride  20 mEq Oral BID  . spironolactone  25 mg Oral Daily   Continuous Infusions: . furosemide (LASIX) infusion 15 mg/hr (11/22/13  [phone])   PRN Meds:.sodium chloride, albuterol, docusate sodium, fentaNYL, oxyCODONE No Known Allergies  Labs and Data Reviewed:     Component Value Date/Time   WBC 14.9* 11/22/2013 0440   WBC 12.9* 04/04/2013 1125   HGB 14.0 11/22/2013 0440   HGB 15.1 04/04/2013 1125   HCT 44.1 11/22/2013 0440   HCT 46.9 04/04/2013 1125   PLT 163 11/22/2013 0440   PLT 459* 04/04/2013 1125   MCV 88.4 11/22/2013 0440   MCV 89.7 04/04/2013 1125   NEUTROABS 23.4* 11/17/2013 0424   NEUTROABS 10.4* 04/04/2013 1125   LYMPHSABS 0.7 11/17/2013 0424   LYMPHSABS 1.5 04/04/2013 1125   MONOABS 0.5 11/17/2013 0424   MONOABS 0.2 04/04/2013 1125   EOSABS 0.0 11/17/2013 0424   EOSABS 0.8* 04/04/2013 1125   BASOSABS 0.0 11/17/2013 0424   BASOSABS 0.0 04/04/2013 1125      Component Value Date/Time   NA 143 11/23/2013 0500    NA 136 03/02/2013 1055   K 4.1 11/23/2013 0500   K 4.2 03/02/2013 1055   CL 95* 11/23/2013 0500   CL 108* 08/04/2012 1302   CO2 37* 11/23/2013 0500   CO2 22 03/02/2013 1055   BUN 25* 11/23/2013 0500   BUN 13.8 03/02/2013 1055   CREATININE 0.83 11/23/2013 0500   CREATININE 0.7 03/02/2013 1055   GLUCOSE 81 11/23/2013 0500   GLUCOSE 111 03/02/2013 1055   GLUCOSE 164* 08/04/2012 1302   CALCIUM 9.0 11/23/2013 0500   CALCIUM 9.7 03/02/2013 1055   AST 8 11/20/2013 0500   AST 12 03/02/2013 1055   ALT 9 11/20/2013 0500   ALT 13 03/02/2013 1055   ALKPHOS 102 11/20/2013 0500   ALKPHOS 134 03/02/2013 1055   BILITOT 0.6 11/20/2013 0500   BILITOT 0.45 03/02/2013 1055   PROT 5.0* 11/20/2013 0500   PROT 6.9 03/02/2013 1055   ALBUMIN 2.4* 11/20/2013 0500   ALBUMIN 3.1* 03/02/2013 1055    Vital Signs: BP 108/78  Pulse 94  Temp(Src) 97.6 F (36.4 C) (Oral)  Resp 18  Ht 5\' 6"  (1.676 m)  Wt 108.863 kg (240 lb)  BMI 38.76 kg/m2  SpO2 91% Filed Weights   11/21/13 0500 11/22/13 0500 11/23/13 0422  Weight: 115.3 kg (254 lb 3.1 oz) 115 kg (253 lb 8.5 oz) 108.863 kg (240 lb)    Physical Exam:  General Appearance: chronically ill appearing   Eyes: normal HEENT:Dry MM, very poor dentition  Lungs: +crackles  SA:[id number] Abdomen: soft Extremities:++edema, right 3-4 toes gangrene Skin: skin tears and echymoses Psych: pleasant cooperative, AOX3   Assessment/Prognosis:  1. Primary Diagnoses:  Acute on Chronic Systolic Heart Failure  Severe CAD  PNA  Ganrene Right Toe  Active Symptoms: 1. Pain, joints, skin tears 2. Dyspnea 3. Immobility   Prognosis: With excellent management of his heart failure he could have >1 year.  PPS: 50%   Advance Care Plan: 1. Full Code- but will probably change- discussing with his family 2. Ready to complete Advance directive- no long term life support or artifical nutrition- wants kids to share [facility] responsibility.   Plan: 1. Please get patient OOB 2. Bowel  Regimen needed 3.   Advance Diet 4.   Schedule Tylenol for joint pain   Disposition:  Plan is once medically stable to go to SNF for rehab -they want [facility] -please request Palliative to follow.   Time In: 1:30 Time Out: 2:40 Total Time: 70 min Greater than 50%  of this time was spent counseling and coordinating care  related to the above assessment and plan.  Signed by:  Roma Schanz, DO  11/23/2013, 2:22 PM  Please contact Palliative Medicine Team phone at [phone] for questions and concerns.

## 2013-11-23 NOTE — Progress Notes (Signed)
Clinical Social Work Department BRIEF PSYCHOSOCIAL ASSESSMENT 11/23/2013  Patient:  Fernando Lane, Fernando Lane     Account Number:  [id number]     Admit date:  11/15/2013  Clinical Social Worker:  Adair Laundry  Date/Time:  11/23/2013 03:00 PM  Referred by:  Physician  Date Referred:  11/23/2013 Referred for  SNF Placement   Other Referral:   Interview type:  Patient Other interview type:   Spoke with pt and pt son at bedside    PSYCHOSOCIAL DATA Living Status:  FACILITY Admitted from facility:  [facility] Level of care:  [place] Primary support name:  Leonardo Makris. Primary support relationship to patient:  CHILD, ADULT Degree of support available:   Pt has strong support from family    CURRENT CONCERNS Current Concerns  Post-Acute Placement   Other Concerns:    SOCIAL WORK ASSESSMENT / PLAN CSW notified that pt was admitted from facility. CSW visited pt room and spoke with pt and pt son. Pt son informed CSW that pt was admitted from [facility] and was admitted there around [facility] Day of this year. Pt and pt son both expressed no concerns with facility and are okay with pt discharging back. However, pt did inform CSW he wanted to check with [facility] to see about bed availability. CSW explained that part of CSW role is to assist with this. Pt thankful for this and agreed for CSW to send referral to [facility] as well as updated clinicals to [facility]. Pt to consider options if [facility] offers a bed otherwise agreeable to return to [facility]. CSW left voicemail for [facility] notifying of referral and confirmed with [facility] representative that they will be able to accept pt back at discharge.   Assessment/plan status:  Psychosocial Support/Ongoing Assessment of Needs Other assessment/ plan:   Information/referral to community resources:   None needed at this time    PATIENT'S/FAMILY'S  RESPONSE TO PLAN OF CARE: Pt and pt son pleasant and coopeartive. They are agreeable to returning to SNF at dc but wanting to check alternative facilities.       [place], [place]

## 2013-11-23 NOTE — Progress Notes (Signed)
[place] TEAM 1 - Stepdown/ICU TEAM Progress Note  Fernando Lane XFG:[id number] DOB: 01-10-1951 DOA: 11/15/2013 PCP: Gwendolyn Grant, MD  Admit HPI / Brief Narrative: 63 year old WM PMHx polycythemia vera, OSA on nocturnal CPAP, CHF, and CAD (2 vessel disease with medical management only). He resides at [facility] where he had developed SOB 9/3 am. He was transported to [facility] ED with septic shock intubated, on pressors. Transferred to [facility] ICU, PCCM. Diagnosed with CDI prior to admission.  Transferred to [facility] 9/10  Assessment/Plan: Septic shock resolved. 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   Code Status: FULL Family Communication: multiple at bedside Disposition Plan: continue SDU. Eventual SNF  Consultants: Dr. Loralie Champagne (cardiology; heart failure) Dr. Merton Border [facility] M.) Dr. Ruta Hinds (vascular surgery) PMT  Procedure/Significant Events: 9/3 Intubated in [facility] ED, transferred to [facility]  9/4 Extubated. Trop I elevated @ 2.63  9/04 WOC consult: Entire buttocks and posterior scrotum with erythremia and patchy areas of skin loss. Right foot is purple and mottled, some toes with dry intact eschar. Barrier cream to scrotum and buttocks to protect, repel moisture, and promote healing. Foam dressing to outer leg wound. If aggressive plan of care is desired to right foot, please consult VVS team for further plan of care.  9/04 VVS Consult: This is a 63 y.o. 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  -foot with  gangrene, but doubt this is the source of his sepsis. Plavix discontinued in anticipation for possible surgery  9/4 TTE: Limited study, akinetic apex with unusual appearance, cannot rule out distal muscular VSD, mildly dilated RA, severely dilated RV, moderately dilated RA moderate TR, no RVSP estimate given  9/05 Cards Consult: consider cardiac MRI to further elucidate the abnormal LV anatomy and clarify cause of RV dilation  9/07 Weaning off vasopressors  9/08 Off vasopressors. Diuresis ordered for severe hypervolemia. Discussed with Dr Debara Pickett re: Echo findings of severe RV dilation. Consider RHC when more stabilized.  9/08 Urology consult: see full note. Rec leaving Foley cath until penile and scrotal edema resolve  9/08 PM: Furosemide gtt initiated by Cardiology  9/09 Advanced Directives conversation initiated 9/09 by Dr Alva Garnet. Advised no CPR/ACLS and DNI or short term intubation only. Palliative Care consultation requested. Transfer to SDU ordered. TRH to assume care as of AM 9/10 and PCCM to sign off  Antibiotics: Vanc 9/03 >> 9/07  Pip-tazo 9/03 >> 9/07  fidaxomicin 8/31 >> 9/09  DVT prophylaxis: Lovenox  LINES / TUBES:  ETT 9/03 >> 9/04  R IJ CVL 9/03 >>   Continuous Infusions: . furosemide (LASIX) infusion 15 mg/hr (11/22/13 9678)   HPI/Subjective: C/o pain in foot, back, knees. No N/V. No dyspnea. No diarrhea  Objective: VITAL SIGNS: Temp: 97.6 F (36.4 C) (09/11 1209) Temp src: Oral (09/11 1209) BP: 108/78 mmHg (09/11 1209) Pulse Rate: 94 (09/11 1209) SPO2; FIO2:   Intake/Output Summary (Last 24 hours) at 11/23/13 1438 Last data filed at 11/23/13 1228  Gross per 24 hour  Intake  45 ml  Output   7850 ml  Net  -7805 ml     Exam: General: A./O. x4, NAD, No acute respiratory distress Lungs: Clear to auscultation bilaterally without wheezes or crackles Cardiovascular: Regular rate and rhythm without murmur gallop or rub normal S1 and S2 Abdomen: Nontender,  nondistended, (anasarca) soft, bowel sounds positive, no rebound, no ascites, no appreciable mass Extremities: Fourth right toe dry gangrene, skin with wrinkling. Nonpitting. dressind right leg CDI  Data Reviewed: Basic Metabolic Panel:  Recent Labs Lab 11/19/13 0500 11/20/13 0500 11/21/13 0500 11/22/13 0440 11/23/13 0500  NA 142 144 144 142 143  K 3.7 3.4* 3.9 3.8 4.1  CL 107 109 110 102 95*  CO2 _0 37*  GLUCOSE 109* 86 86 76 81  BUN 52* 47* 38* 30* 25*  CREATININE 1.59* 1.24 0.96 0.86 0.83  CALCIUM 8.2* 8.2* 8.1* 8.5 9.0  MG  --   --   --   --  1.4*   Liver Function Tests:  Recent Labs Lab 11/17/13 0424 11/20/13 0500  AST 12 8  ALT 14 9  ALKPHOS 87 102  BILITOT 0.9 0.6  PROT 4.9* 5.0*  ALBUMIN 2.4* 2.4*   No results found for this basename: LIPASE, AMYLASE,  in the last 168 hours No results found for this basename: AMMONIA,  in the last 168 hours CBC:  Recent Labs Lab 11/17/13 0424 11/18/13 0403 11/19/13 0500 11/20/13 0500 11/21/13 0500 11/22/13 0440  WBC 24.6* 23.5* 19.9* 13.8* 14.2* 14.9*  NEUTROABS 23.4*  --   --   --   --   --   HGB 14.1 13.9 14.4 14.0 13.7 14.0  HCT 43.8 44.2 45.2 44.2 43.9 44.1  MCV 88.3 89.1 88.1 89.8 90.1 88.4  PLT 457* 413* 324 199 174 163   Cardiac Enzymes:  Recent Labs Lab 11/16/13 1557 11/16/13 2221  TROPONINI 2.07* 1.98*   BNP (last 3 results)  Recent Labs  08/07/13 1600 08/23/13 1759 11/15/13 1951  PROBNP 13375.0* 9383.0* 9486.0*   CBG:  Recent Labs Lab 11/19/13 1610 11/20/13 0817 11/20/13 1229 11/20/13 1544 11/20/13 1950  GLUCAP 94 68* 91 97 83    Recent Results (from the past 240 hour(s))  MRSA PCR SCREENING     Status: Abnormal   Collection Time    11/15/13  6:02 PM      Result Value Ref Range Status   MRSA by PCR POSITIVE (*) NEGATIVE Final   Comment:            The GeneXpert MRSA Assay (FDA     approved for NASAL specimens     only), is one component of a     comprehensive MRSA  colonization     surveillance program. It is not     intended to diagnose MRSA     infection nor to guide or     monitor treatment for     MRSA infections.     RESULT CALLED TO, READ BACK BY AND VERIFIED WITH:     D.TURNER AT 2106 11/15/13  CULTURE, BLOOD (ROUTINE X 2)     Status: None   Collection Time    11/15/13  8:37 PM      Result Value Ref Range Status   Specimen Description BLOOD LEFT ARM   Final   Special Requests BOTTLES DRAWN AEROBIC ONLY 8CC   Final   Culture  Setup Time     Final   Value: 11/16/2013 04:13  Performed at [facility]     Final   Value: NO GROWTH 5 DAYS     Performed at [facility]   Report Status 11/22/2013 FINAL   Final  URINE CULTURE     Status: None   Collection Time    11/15/13 10:32 PM      Result Value Ref Range Status   Specimen Description URINE, CATHETERIZED   Final   Special Requests NONE   Final   Culture  Setup Time     Final   Value: 11/16/2013 00:55     Performed at [facility]     Final   Value: 3,000 COLONIES/ML     Performed at [facility]   Culture     Final   Value: INSIGNIFICANT GROWTH     Performed at [facility]   Report Status 11/17/2013 FINAL   Final  CULTURE, RESPIRATORY (NON-EXPECTORATED)     Status: None   Collection Time    11/15/13 11:30 PM      Result Value Ref Range Status   Specimen Description TRACHEAL ASPIRATE   Final   Special Requests NONE   Final   Gram Stain     Final   Value: MODERATE WBC PRESENT,BOTH PMN AND MONONUCLEAR     RARE SQUAMOUS EPITHELIAL CELLS PRESENT     RARE GRAM POSITIVE COCCI     IN PAIRS RARE GRAM POSITIVE RODS     Performed at [facility]   Culture     Final   Value: Non-Pathogenic Oropharyngeal-type Flora Isolated.     Performed at [facility]   Report Status 11/18/2013 FINAL   Final  CULTURE, BLOOD (ROUTINE X 2)     Status: None   Collection Time    11/16/13  8:45 AM      Result Value Ref  Range Status   Specimen Description BLOOD LEFT ANTECUBITAL   Final   Special Requests BOTTLES DRAWN AEROBIC ONLY 4CC   Final   Culture  Setup Time     Final   Value: 11/16/2013 14:27     Performed at [facility]   Culture     Final   Value: NO GROWTH 5 DAYS     Performed at [facility]   Report Status 11/22/2013 FINAL   Final     Studies:  Recent x-ray studies have been reviewed in detail by the Attending Physician  Scheduled Meds:  Scheduled Meds: . aspirin  325 mg Oral Daily  . atorvastatin  80 mg Oral q1800  . bacitracin   Topical BID  . enoxaparin (LOVENOX) injection  55 mg Subcutaneous Daily  . feeding supplement (ENSURE COMPLETE)  237 mL Oral BID BM  . Gerhardt's butt cream   Topical BID  . lisinopril  2.5 mg Oral BID  . metolazone  2.5 mg Oral BID  . potassium chloride  20 mEq Oral BID  . senna-docusate  1 tablet Oral QHS  . spironolactone  25 mg Oral Daily   Time spent on care of this patient: 25 mins  Fernando Lane , MD  [facility] [phone]  If 7PM-7AM, please contact night-coverage [email or web address] Password [email or web address] 11/23/2013, 2:38 PM   LOS: 8 days

## 2013-11-24 DIAGNOSIS — I739 Peripheral vascular disease, unspecified: Secondary | ICD-10-CM

## 2013-11-24 DIAGNOSIS — L98499 Non-pressure chronic ulcer of skin of other sites with unspecified severity: Secondary | ICD-10-CM

## 2013-11-24 LAB — CARBOXYHEMOGLOBIN
Carboxyhemoglobin: 2 % — ABNORMAL HIGH (ref 0.5–1.5)
METHEMOGLOBIN: 0.6 % (ref 0.0–1.5)
O2 Saturation: 81.6 %
Total hemoglobin: 15.3 g/dL (ref 13.5–18.0)

## 2013-11-24 LAB — BASIC METABOLIC PANEL
ANION GAP: 6 (ref 5–15)
BUN: 21 mg/dL (ref 6–23)
CO2: 44 mEq/L (ref 19–32)
Calcium: 9.3 mg/dL (ref 8.4–10.5)
Chloride: 91 mEq/L — ABNORMAL LOW (ref 96–112)
Creatinine, Ser: 0.8 mg/dL (ref 0.50–1.35)
GFR calc Af Amer: >90 mL/min (ref >90)
Glucose, Bld: 86 mg/dL (ref 70–99)
POTASSIUM: 4.2 meq/L (ref 3.7–5.3)
SODIUM: 141 meq/L (ref 137–147)

## 2013-11-24 LAB — GLUCOSE, CAPILLARY: Glucose-Capillary: 88 mg/dL (ref 70–99)

## 2013-11-24 LAB — MAGNESIUM: MAGNESIUM: 1.5 mg/dL (ref 1.5–2.5)

## 2013-11-24 MED ORDER — ACETAZOLAMIDE ER 500 MG PO CP12
500.0000 mg | ORAL_CAPSULE | Freq: Two times a day (BID) | ORAL | Status: DC
  Filled 2013-11-24 (×2): qty 1

## 2013-11-24 MED ORDER — MAGNESIUM SULFATE 4000MG/100ML IJ SOLN
4.0000 g | Freq: Once | INTRAMUSCULAR | Status: AC
  Administered 2013-11-24: 4 g via INTRAVENOUS
  Filled 2013-11-24: qty 100

## 2013-11-24 MED ORDER — ACETAZOLAMIDE 250 MG PO TABS
500.0000 mg | ORAL_TABLET | Freq: Two times a day (BID) | ORAL | Status: DC
  Administered 2013-11-24 (×2): 500 mg via ORAL
  Filled 2013-11-24 (×4): qty 2

## 2013-11-24 NOTE — Progress Notes (Signed)
CRITICAL VALUE ALERT  Critical value received:  CO2 44  Date of notification:  11/24/13  Time of notification:  0735  Critical value read back:Yes.    Nurse who received alert:  Mckayla  MD notified (1st page):  Conley Canal  Time of first page:  [phone]  MD notified (2nd page):  Time of second page:  Responding MD:   Time MD responded:

## 2013-11-24 NOTE — Progress Notes (Signed)
Attempted to start CPAP for HS at this time.  Patient states he is not ready for CPAP.  Patient advised to notify RT when ready.

## 2013-11-24 NOTE — Progress Notes (Addendum)
Nurse contacted both Primary and Cardiologist to inform that pt BP systolic ranged in 51'G. Pt is asymptomatic. Cardiologist instructed nurse to d/c Lasix drip and to hold next dose of lisinopril, systolic BP acceptable if >90.  Nurse will continue to monitor.

## 2013-11-24 NOTE — Progress Notes (Signed)
MD informed that left foot is cool to the touch, unable to palpate or doppler pedal pulse.  MD is at bedside

## 2013-11-24 NOTE — Progress Notes (Signed)
Nurse re positioned pt for CVA measurement at beginning of shift, pt CVA rang 8-10.  Nurse will continue to monitor

## 2013-11-24 NOTE — Progress Notes (Signed)
[place] TEAM 1 - Stepdown/ICU TEAM Progress Note  Fernando Lane UYQ:[id number] DOB: [birth year] DOA: 11/15/2013 PCP: Gwendolyn Grant, MD  Admit HPI / Brief Narrative: 63 year old WM PMHx polycythemia vera, OSA on nocturnal CPAP, CHF, and CAD (2 vessel disease with medical management only). He resides at [facility] where he had developed SOB 9/3 am. He was transported to [facility] ED with septic shock intubated, on pressors. Transferred to [facility] ICU, PCCM. Diagnosed with CDI prior to admission.  Transferred to [facility] 9/10  Assessment/Plan: Septic shock resolved. 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 Eventual SNF  Consultants: Dr. Loralie Champagne (cardiology; heart failure) Dr. Merton Border [facility] M.) Dr. Ruta Hinds (vascular surgery) PMT  Procedure/Significant Events: 9/3 Intubated in [facility] ED, transferred to [facility]  9/4 Extubated. Trop I elevated @ 2.63  9/04 WOC consult: Entire buttocks and posterior scrotum with erythremia and patchy areas of skin loss. Right foot is purple and mottled, some toes with dry intact eschar. Barrier cream to scrotum and buttocks to protect, repel moisture, and promote healing. Foam dressing to outer leg wound. If aggressive plan of care is desired to right foot, please consult VVS team for further plan of care.  9/04 VVS Consult: This is a 63 y.o. 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  -foot with gangrene, but doubt this  is the source of his sepsis. Plavix discontinued in anticipation for possible surgery  9/4 TTE: Limited study, akinetic apex with unusual appearance, cannot rule out distal muscular VSD, mildly dilated RA, severely dilated RV, moderately dilated RA moderate TR, no RVSP estimate given  9/05 Cards Consult: consider cardiac MRI to further elucidate the abnormal LV anatomy and clarify cause of RV dilation  9/07 Weaning off vasopressors  9/08 Off vasopressors. Diuresis ordered for severe hypervolemia. Discussed with Dr Debara Pickett re: Echo findings of severe RV dilation. Consider RHC when more stabilized.  9/08 Urology consult: see full note. Rec leaving Foley cath until penile and scrotal edema resolve  9/08 PM: Furosemide gtt initiated by Cardiology  9/09 Advanced Directives conversation initiated 9/09 by Dr Alva Garnet. Advised no CPR/ACLS and DNI or short term intubation only. Palliative Care consultation requested. Transfer to SDU ordered. TRH to assume care as of AM 9/10 and PCCM to sign off  Antibiotics: Vanc 9/03 >> 9/07  Pip-tazo 9/03 >> 9/07  fidaxomicin 8/31 >> 9/09  DVT prophylaxis: Lovenox  LINES / TUBES:  ETT 9/03 >> 9/04  R IJ CVL 9/03 >>   Continuous Infusions: . furosemide (LASIX) infusion 15 mg/hr (11/24/13 0900)   HPI/Subjective: Pain unchanged. C/o dietary restrictions. No dyspnea.  Objective: VITAL SIGNS: Temp: 97.3 F (36.3 C) (09/12 0800) Temp src: Axillary (09/12 0800) BP: 100/66 mmHg (09/12 0855) Pulse Rate: 80 (09/12 0800) SPO2; FIO2:   Intake/Output Summary (Last 24 hours) at 11/24/13 1215 Last data filed at 11/24/13 0424  Gross per 24 hour  Intake      0 ml  Output   4900 ml  Net  -4900 ml     Exam: General: A./O. x4, NAD, No acute respiratory distress. Lying nearly flat Lungs: Clear to auscultation bilaterally without wheezes or crackles Cardiovascular: Regular rate and rhythm without murmur gallop or rub normal S1 and S2 Abdomen: Nontender,  nondistended, soft, bowel sounds positive, no rebound Extremities: Fourth right toe dry gangrene, skin with wrinkling. Nonpitting. dressind right leg CDI. Left foot cool  Data Reviewed: Basic Metabolic Panel:  Recent Labs Lab 11/20/13 0500 11/21/13 0500 11/22/13 0440 11/23/13 0500 11/24/13 0627  NA 144 144 142 143 141  K 3.4* 3.9 3.8 4.1 4.2  CL 109 110 102 95* 91*  CO2 24 25 29  37* 44*  GLUCOSE 86 86 76 81 86  BUN 47* 38* 30* 25* 21  CREATININE 1.24 0.96 0.86 0.83 0.80  CALCIUM 8.2* 8.1* 8.5 9.0 9.3  MG  --   --   --  1.4* 1.5   Liver Function Tests:  Recent Labs Lab 11/20/13 0500  AST 8  ALT 9  ALKPHOS 102  BILITOT 0.6  PROT 5.0*  ALBUMIN 2.4*   No results found for this basename: LIPASE, AMYLASE,  in the last 168 hours No results found for this basename: AMMONIA,  in the last 168 hours CBC:  Recent Labs Lab 11/18/13 0403 11/19/13 0500 11/20/13 0500 11/21/13 0500 11/22/13 0440  WBC 23.5* 19.9* 13.8* 14.2* 14.9*  HGB 13.9 14.4 14.0 13.7 14.0  HCT 44.2 45.2 44.2 43.9 44.1  MCV 89.1 88.1 89.8 90.1 88.4  PLT 413* 324 199 174 163   Cardiac Enzymes: No results found for this basename: CKTOTAL, CKMB, CKMBINDEX, TROPONINI,  in the last 168 hours BNP (last 3 results)  Recent Labs  08/07/13 1600 08/23/13 1759 11/15/13 1951  PROBNP 13375.0* 9383.0* 9486.0*   CBG:  Recent Labs Lab 11/20/13 0817 11/20/13 1229 11/20/13 1544 11/20/13 1950 11/24/13 1149  GLUCAP 68* 91 97 83 88    Recent Results (from the past 240 hour(s))  MRSA PCR SCREENING     Status: Abnormal   Collection Time    11/15/13  6:02 PM      Result Value Ref Range Status   MRSA by PCR POSITIVE (*) NEGATIVE Final   Comment:            The GeneXpert MRSA Assay (FDA     approved for NASAL specimens     only), is one component of a     comprehensive MRSA colonization     surveillance program. It is not     intended to diagnose MRSA     infection nor to guide or     monitor treatment  for     MRSA infections.     RESULT CALLED TO, READ BACK BY AND VERIFIED WITH:     D.TURNER AT 2106 11/15/13  CULTURE, BLOOD (ROUTINE X 2)     Status: None   Collection Time    11/15/13  8:37 PM      Result Value Ref Range Status   Specimen Description BLOOD LEFT ARM   Final   Special Requests BOTTLES DRAWN AEROBIC ONLY 8CC   Final   Culture  Setup Time     Final   Value: 11/16/2013 04:13     Performed at [facility]   Culture     Final   Value: NO GROWTH 5 DAYS     Performed at [facility]   Report Status 11/22/2013 FINAL   Final  URINE  CULTURE     Status: None   Collection Time    11/15/13 10:32 PM      Result Value Ref Range Status   Specimen Description URINE, CATHETERIZED   Final   Special Requests NONE   Final   Culture  Setup Time     Final   Value: 11/16/2013 00:55     Performed at [place]     Final   Value: 3,000 COLONIES/ML     Performed at [facility]   Culture     Final   Value: INSIGNIFICANT GROWTH     Performed at [facility]   Report Status 11/17/2013 FINAL   Final  CULTURE, RESPIRATORY (NON-EXPECTORATED)     Status: None   Collection Time    11/15/13 11:30 PM      Result Value Ref Range Status   Specimen Description TRACHEAL ASPIRATE   Final   Special Requests NONE   Final   Gram Stain     Final   Value: MODERATE WBC PRESENT,BOTH PMN AND MONONUCLEAR     RARE SQUAMOUS EPITHELIAL CELLS PRESENT     RARE GRAM POSITIVE COCCI     IN PAIRS RARE GRAM POSITIVE RODS     Performed at [facility]   Culture     Final   Value: Non-Pathogenic Oropharyngeal-type Flora Isolated.     Performed at [facility]   Report Status 11/18/2013 FINAL   Final  CULTURE, BLOOD (ROUTINE X 2)     Status: None   Collection Time    11/16/13  8:45 AM      Result Value Ref Range Status   Specimen Description BLOOD LEFT ANTECUBITAL   Final   Special Requests BOTTLES DRAWN AEROBIC ONLY 4CC   Final    Culture  Setup Time     Final   Value: 11/16/2013 14:27     Performed at [facility]   Culture     Final   Value: NO GROWTH 5 DAYS     Performed at [facility]   Report Status 11/22/2013 FINAL   Final     Studies:  Recent x-ray studies have been reviewed in detail by the Attending Physician  Scheduled Meds:  Scheduled Meds: . acetaZOLAMIDE  500 mg Oral BID  . aspirin  325 mg Oral Daily  . atorvastatin  80 mg Oral q1800  . bacitracin   Topical BID  . enoxaparin (LOVENOX) injection  55 mg Subcutaneous Daily  . feeding supplement (ENSURE COMPLETE)  237 mL Oral BID BM  . Gerhardt's butt cream   Topical BID  . lisinopril  2.5 mg Oral BID  . potassium chloride SA  20 mEq Oral BID  . senna-docusate  1 tablet Oral QHS  . spironolactone  25 mg Oral Daily   Time spent on care of this patient: 20 mins  Delfina Redwood , MD  [facility] [phone]  If 7PM-7AM, please contact night-coverage [email or web address] Password [email or web address] 11/24/2013, 12:15 PM   LOS: 9 days

## 2013-11-24 NOTE — Progress Notes (Addendum)
ADVANCED HEART FAILURE ROUNDING NOTE DAILY PROGRESS NOTE  Subjective:   Diuresing with lasix drip, spiro, and metolazone. Weight recorded as down another 20 pounds - not sure accurate. I/O - 5L  Baseline weight 215 pounds.  Now 220. Sat up in chair yesterday. CVP 12. Renal function stable. Bicarb 44.    Advanced Directives conversation initiated 9/09 by Dr Alva Garnet. Palliative Care has seen. Remains Full Code.   Denies SOB/CP. "I feel great"   CO-OX 82%    Objective:  Temp:  [96.6 F (35.9 C)-97.8 F (36.6 C)] 96.6 F (35.9 C) (09/12 0420) Pulse Rate:  [80-94] 80 (09/12 0800) Resp:  [11-18] 11 (09/12 0800) BP: (84-108)/(55-78) 104/75 mmHg (09/12 0800) SpO2:  [91 %-96 %] 96 % (09/12 0800) Weight:  [99.791 kg (220 lb)] 99.791 kg (220 lb) (09/12 0420) Weight change: -9.072 kg (-20 lb)  Intake/Output from previous day: 09/11 0701 - 09/12 0700 In: -  Out: 5900 [Urine:5900]  Intake/Output from this shift:    Medications: Current Facility-Administered Medications  Medication Dose Route Frequency Provider Last Rate Last Dose  . 0.9 %  sodium chloride infusion  250 mL Intravenous PRN Corey Harold, NP 10 mL/hr at 11/18/13 1000 250 mL at 11/18/13 1000  . albuterol (PROVENTIL) (2.5 MG/3ML) 0.083% nebulizer solution 2.5 mg  2.5 mg Nebulization Q4H PRN Wilhelmina Mcardle, MD      . aspirin tablet 325 mg  325 mg Oral Daily Doree Fudge, MD   325 mg at 11/23/13 1035  . atorvastatin (LIPITOR) tablet 80 mg  80 mg Oral q1800 Doree Fudge, MD   80 mg at 11/23/13 1918  . bacitracin ointment   Topical BID Allie Bossier, MD   1 application at 45/03/88 0132  . bisacodyl (DULCOLAX) suppository 10 mg  10 mg Rectal Daily PRN Acquanetta Chain, DO      . docusate sodium (COLACE) capsule 100 mg  100 mg Oral Daily PRN Melvenia Needles, NP   100 mg at 11/18/13 0545  . enoxaparin (LOVENOX) injection 55 mg  55 mg Subcutaneous Daily Jolaine Artist, MD   55 mg at 11/23/13  1033  . feeding supplement (ENSURE COMPLETE) (ENSURE COMPLETE) liquid 237 mL  237 mL Oral BID BM Rogue Bussing, RD   237 mL at 11/22/13 1314  . fentaNYL (SUBLIMAZE) injection 25-50 mcg  25-50 mcg Intravenous Q2H PRN Wilhelmina Mcardle, MD   50 mcg at 11/22/13 1308  . furosemide (LASIX) 250 mg in dextrose 5 % 250 mL (1 mg/mL) infusion  15 mg/hr Intravenous Continuous Jolaine Artist, MD 15 mL/hr at 11/22/13 8280 15 mg/hr at 11/22/13 0349  . Gerhardt's butt cream   Topical BID Acquanetta Chain, DO      . lisinopril (PRINIVIL,ZESTRIL) tablet 2.5 mg  2.5 mg Oral BID Larey Dresser, MD   2.5 mg at 11/23/13 1033  . metolazone (ZAROXOLYN) tablet 2.5 mg  2.5 mg Oral BID Jolaine Artist, MD   2.5 mg at 11/23/13 1959  . oxyCODONE (Oxy IR/ROXICODONE) immediate release tablet 5-10 mg  5-10 mg Oral Q4H PRN Samella Parr, NP   10 mg at 11/24/13 1791  . potassium chloride SA (K-DUR,KLOR-CON) CR tablet 20 mEq  20 mEq Oral BID Delfina Redwood, MD   20 mEq at 11/24/13 0129  . senna-docusate (Senokot-S) tablet 1 tablet  1 tablet Oral QHS Acquanetta Chain, DO   1 tablet at 11/24/13 0129  . spironolactone (ALDACTONE)  tablet 25 mg  25 mg Oral Daily Jolaine Artist, MD   25 mg at 11/23/13 1033    Physical Exam: CVP 12 General:  Chronically ill-appearing. No resp difficulty HEENT: normal Neck: supple. JVP up Carotids 2+ bilat; no bruits. RIJ  TLC Cor: RRR distant Lungs: clear anteriorly Abdomen: markedly obese mildly distended.  Good bowel sounds. Extremities: no cyanosis, clubbing, rash, 2+ edema in LEs .  penile and scrotal edema resolving . decubitus ulcers covered. Gangrenous toes R foot  RUE edema  Neuro: alert & orientedx3, cranial nerves grossly intact. moves all 4 extremities w/o difficulty. Affect pleasant   Lab Results: Results for orders placed during the hospital encounter of 11/15/13 (from the past 48 hour(s))  CARBOXYHEMOGLOBIN     Status: Abnormal   Collection Time     11/23/13  5:00 AM      Result Value Ref Range   Total hemoglobin 14.8  13.5 - 18.0 g/dL   O2 Saturation 68.1     Carboxyhemoglobin 1.9 (*) 0.5 - 1.5 %   Methemoglobin 0.5  0.0 - 1.5 %  BASIC METABOLIC PANEL     Status: Abnormal   Collection Time    11/23/13  5:00 AM      Result Value Ref Range   Sodium 143  137 - 147 mEq/L   Potassium 4.1  3.7 - 5.3 mEq/L   Chloride 95 (*) 96 - 112 mEq/L   CO2 37 (*) 19 - 32 mEq/L   Glucose, Bld 81  70 - 99 mg/dL   BUN 25 (*) 6 - 23 mg/dL   Creatinine, Ser 0.83  0.50 - 1.35 mg/dL   Calcium 9.0  8.4 - 10.5 mg/dL   GFR calc non Af Amer >90  >90 mL/min   GFR calc Af Amer >90  >90 mL/min   Comment: (NOTE)     The eGFR has been calculated using the CKD EPI equation.     This calculation has not been validated in all clinical situations.     eGFR's persistently <90 mL/min signify possible Chronic Kidney     Disease.   Anion gap 11  5 - 15  MAGNESIUM     Status: Abnormal   Collection Time    11/23/13  5:00 AM      Result Value Ref Range   Magnesium 1.4 (*) 1.5 - 2.5 mg/dL  MAGNESIUM     Status: None   Collection Time    11/24/13  6:27 AM      Result Value Ref Range   Magnesium 1.5  1.5 - 2.5 mg/dL  BASIC METABOLIC PANEL     Status: Abnormal   Collection Time    11/24/13  6:27 AM      Result Value Ref Range   Sodium 141  137 - 147 mEq/L   Potassium 4.2  3.7 - 5.3 mEq/L   Chloride 91 (*) 96 - 112 mEq/L   CO2 44 (*) 19 - 32 mEq/L   Comment: CRITICAL RESULT CALLED TO, READ BACK BY AND VERIFIED WITH:     [facility] Hearing [id number] WILDERK   Glucose, Bld 86  70 - 99 mg/dL   BUN 21  6 - 23 mg/dL   Creatinine, Ser 0.80  0.50 - 1.35 mg/dL   Calcium 9.3  8.4 - 10.5 mg/dL   GFR calc non Af Amer >90  >90 mL/min   GFR calc Af Amer >90  >90 mL/min   Comment: (NOTE)  The eGFR has been calculated using the CKD EPI equation.     This calculation has not been validated in all clinical situations.     eGFR's persistently <90 mL/min signify possible  Chronic Kidney     Disease.   Anion gap 6  5 - 15  CARBOXYHEMOGLOBIN     Status: Abnormal   Collection Time    11/24/13  6:46 AM      Result Value Ref Range   Total hemoglobin 15.3  13.5 - 18.0 g/dL   O2 Saturation 81.6     Carboxyhemoglobin 2.0 (*) 0.5 - 1.5 %   Methemoglobin 0.6  0.0 - 1.5 %    Imaging: Imaging results have been reviewed  Assessment: 1. 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  Not LVAD candidate with severe RV failure and gangrene.      --EF approximately 35%, RV severely dilated. Difficult study.  2. Anasarca 3. CAD s/p out-of -hospital LAD infarct 5/15, patient also had likely RV infarction.      --LAD and RCA 100% occluded. Lcx 30% EF 20% -> medical management 4. 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 PAD, severe.  Dry gangrene right foot.  6. Morbid obesity 7. Acute on chronic respiratory failure 8. Severe decubiti 9. C difficile colitis: He has completed treatment.   10. Acute kidney injury, improved 11. Protein-calorie malnutrition 12. RUE Edema.  13. Hypomagnesemia  Plan/discussion:  Volume status continues to improve briskly. Has developed contraction alkalosis. Will stop metolazone and start diamox. Continue lasix gtt.  Lisinopril started yesterday and he is tolerating. SBP 95-105. Will hold off on restarting b-blocker until diuresis complete. Continue rehab. Will sup mag.   Glori Bickers MD 11/24/2013 8:15 AM  Addendum 1pm: BP now 80s. CVP 8. Stop lasix. Hold lisinopril.  Korinne Greenstein,MD 1:06 PM

## 2013-11-25 DIAGNOSIS — I9589 Other hypotension: Secondary | ICD-10-CM

## 2013-11-25 DIAGNOSIS — I959 Hypotension, unspecified: Secondary | ICD-10-CM

## 2013-11-25 DIAGNOSIS — N390 Urinary tract infection, site not specified: Secondary | ICD-10-CM | POA: Diagnosis not present

## 2013-11-25 LAB — CBC WITH DIFFERENTIAL/PLATELET
BASOS PCT: 0 % (ref 0–1)
Basophils Absolute: 0 10*3/uL (ref 0.0–0.1)
Eosinophils Absolute: 0.4 10*3/uL (ref 0.0–0.7)
Eosinophils Relative: 2 % (ref 0–5)
HCT: 48.5 % (ref 39.0–52.0)
HEMOGLOBIN: 15.1 g/dL (ref 13.0–17.0)
Lymphocytes Relative: 6 % — ABNORMAL LOW (ref 12–46)
Lymphs Abs: 1 10*3/uL (ref 0.7–4.0)
MCH: 28.3 pg (ref 26.0–34.0)
MCHC: 31.1 g/dL (ref 30.0–36.0)
MCV: 91 fL (ref 78.0–100.0)
Monocytes Absolute: 0.4 10*3/uL (ref 0.1–1.0)
Monocytes Relative: 2 % — ABNORMAL LOW (ref 3–12)
NEUTROS ABS: 16.1 10*3/uL — AB (ref 1.7–7.7)
NEUTROS PCT: 90 % — AB (ref 43–77)
Platelets: 405 10*3/uL — ABNORMAL HIGH (ref 150–400)
RBC: 5.33 MIL/uL (ref 4.22–5.81)
RDW: 17.8 % — AB (ref 11.5–15.5)
WBC: 18 10*3/uL — ABNORMAL HIGH (ref 4.0–10.5)

## 2013-11-25 LAB — MAGNESIUM: Magnesium: 2.2 mg/dL (ref 1.5–2.5)

## 2013-11-25 LAB — URINALYSIS, ROUTINE W REFLEX MICROSCOPIC
Bilirubin Urine: NEGATIVE
Glucose, UA: NEGATIVE mg/dL
Ketones, ur: NEGATIVE mg/dL
NITRITE: NEGATIVE
PH: 8.5 — AB (ref 5.0–8.0)
PROTEIN: 30 mg/dL — AB
Specific Gravity, Urine: 1.013 (ref 1.005–1.030)
Urobilinogen, UA: 0.2 mg/dL (ref 0.0–1.0)

## 2013-11-25 LAB — BASIC METABOLIC PANEL
Anion gap: 6 (ref 5–15)
BUN: 21 mg/dL (ref 6–23)
CHLORIDE: 86 meq/L — AB (ref 96–112)
CO2: 44 mEq/L (ref 19–32)
Calcium: 9.3 mg/dL (ref 8.4–10.5)
Creatinine, Ser: 0.83 mg/dL (ref 0.50–1.35)
GFR calc Af Amer: >90 mL/min (ref >90)
GFR calc non Af Amer: >90 mL/min (ref >90)
GLUCOSE: 85 mg/dL (ref 70–99)
POTASSIUM: 4.7 meq/L (ref 3.7–5.3)
Sodium: 136 mEq/L — ABNORMAL LOW (ref 137–147)

## 2013-11-25 LAB — URINE MICROSCOPIC-ADD ON

## 2013-11-25 MED ORDER — DEXTROSE 5 % IV SOLN
1.0000 g | INTRAVENOUS | Status: DC
  Administered 2013-11-25 – 2013-11-28 (×4): 1 g via INTRAVENOUS
  Filled 2013-11-25 (×5): qty 10

## 2013-11-25 NOTE — Progress Notes (Signed)
Nurse informed primary MD that systolic BP dropped into 12'R.  MD assessed pt and informed nurse that no interventions are necessary as long as pt remains asymptomatic.  Nurse will continue to monitor

## 2013-11-25 NOTE — Progress Notes (Signed)
[place] TEAM 1 - Stepdown/ICU TEAM Progress Note  Fernando Lane RKY:[id number] DOB: [birth year] DOA: 11/15/2013 PCP: Gwendolyn Grant, MD  Admit HPI / Brief Narrative: 63 year old WM PMHx polycythemia vera, OSA on nocturnal CPAP, CHF, and CAD (2 vessel disease with medical management only). He resides at [facility] where he had developed SOB 9/3 am. He was transported to [facility] ED with septic shock intubated, on pressors. Transferred to [facility] ICU, PCCM. Diagnosed with CDI prior to admission.  Transferred to [facility] 9/10  Assessment/Plan: Septic shock. Completed abx for CDI. Blood pressure low, but patient asymptomatic in chair asking to get up and walk around. Lasix gtt, lisinopril stopped. UA shows UTI and WBC up 18K compared to 13 K a few days ago.  Will give short course of ceftriaxone and watch for signs of C diff recurrence.  Palliative care following. Remains full code/full care at this time. If blood pressure drops further or becomes symptomatic with hypotension, will require fluid bolus.  UTI: see above.  Ordered urine culture. Urology had to place foley. Last note says "leave in for now ... Until edema improves". Edema better from what I can tell, may want to discuss with urology Monday removal  asymptomatyic hypotension: see above.  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 Watch for recurrence on rocephin.  -Dry gangrene of fourth right toe, vascular surgery plan is for amputation when patient stable   Chronic pain - severe debilitation; continue oxycodone IR  PMT following. Prognosis guarded.  Code Status: FULL Family Communication: multiple at bedside Disposition Plan: continue SDU. Eventual SNF  Consultants: Dr. Loralie Champagne  (cardiology; heart failure) Dr. Merton [facility] M.) Dr. Ruta Hinds (vascular surgery) PMT  Procedure/Significant Events: 9/3 Intubated in [facility] ED, transferred to [facility]  9/4 Extubated. Trop I elevated @ 2.63  9/04 WOC consult: Entire buttocks and posterior scrotum with erythremia and patchy areas of skin loss. Right foot is purple and mottled, some toes with dry intact eschar. Barrier cream to scrotum and buttocks to protect, repel moisture, and promote healing. Foam dressing to outer leg wound. If aggressive plan of care is desired to right foot, please consult VVS team for further plan of care.  9/04 VVS Consult: This is a 63 y.o. 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  -foot with gangrene, but doubt this is the source of his sepsis. Plavix discontinued in anticipation for possible surgery  9/4 TTE: Limited study, akinetic apex with unusual appearance, cannot rule out distal muscular VSD, mildly dilated RA, severely dilated RV, moderately dilated RA moderate TR, no RVSP estimate given  9/05 Cards Consult: consider cardiac MRI to further elucidate the abnormal LV anatomy and clarify cause of RV dilation  9/07 Weaning off vasopressors  9/08 Off vasopressors. Diuresis ordered for severe hypervolemia. Discussed with Dr Debara Pickett re: Echo findings of severe RV dilation. Consider RHC when more stabilized.  9/08 Urology consult: see full note. Rec leaving Foley cath until penile and scrotal edema resolve  9/08 PM: Furosemide gtt initiated by Cardiology  9/09 Advanced Directives conversation initiated 9/09 by Dr Alva Garnet. Advised no CPR/ACLS and DNI or short term intubation only. Palliative Care consultation requested. Transfer to SDU ordered. TRH to assume care as  of AM 9/10 and PCCM to sign off  Antibiotics: Vanc 9/03 >> 9/07  Pip-tazo 9/03 >> 9/07  fidaxomicin 8/31 >> 9/09  DVT prophylaxis: Lovenox  LINES / TUBES:  ETT 9/03 >> 9/04  R  IJ CVL 9/03 >>   Continuous Infusions:   HPI/Subjective: Feels "great". No F/C/abdominal pain, n/v. No dizziness or somnolence in chair. Wants to walk to BR.  Objective: VITAL SIGNS: Temp: 97.7 F (36.5 C) (09/13 1132) Temp src: Oral (09/13 1132) BP: 76/56 mmHg (09/13 1246) Pulse Rate: 91 (09/13 1132) SPO2; FIO2:   Intake/Output Summary (Last 24 hours) at 11/25/13 1305 Last data filed at 11/25/13 0502  Gross per 24 hour  Intake    120 ml  Output   2250 ml  Net  -2130 ml     Exam: General: in chair. Talkative, nontoxic Lungs: Clear to auscultation bilaterally without wheezes or crackles Cardiovascular: Regular rate and rhythm without murmur gallop or rub normal S1 and S2 Abdomen: Nontender, nondistended, soft, bowel sounds positive, no rebound GU: foley. Minimal to no penile or scrotal edema Extremities: Fourth right toe dry gangrene, skin with wrinkling. Nonpitting. dressind right leg CDI. Venous stasis changes without signs of cellulitis  Data Reviewed: Basic Metabolic Panel:  Recent Labs Lab 11/21/13 0500 11/22/13 0440 11/23/13 0500 11/24/13 0627 11/25/13 0500  NA 144 142 143 141 136*  K 3.9 3.8 4.1 4.2 4.7  CL 110 102 95* 91* 86*  CO2 25 29 37* 44* 44*  GLUCOSE 86 76 81 86 85  BUN 38* 30* 25* 21 21  CREATININE 0.96 0.86 0.83 0.80 0.83  CALCIUM 8.1* 8.5 9.0 9.3 9.3  MG  --   --  1.4* 1.5 2.2   Liver Function Tests:  Recent Labs Lab 11/20/13 0500  AST 8  ALT 9  ALKPHOS 102  BILITOT 0.6  PROT 5.0*  ALBUMIN 2.4*   No results found for this basename: LIPASE, AMYLASE,  in the last 168 hours No results found for this basename: AMMONIA,  in the last 168 hours CBC:  Recent Labs Lab 11/19/13 0500 11/20/13 0500 11/21/13 0500 11/22/13 0440 11/25/13 1020  WBC 19.9* 13.8* 14.2* 14.9* 18.0*  NEUTROABS  --   --   --   --  16.1*  HGB 14.4 14.0 13.7 14.0 15.1  HCT 45.2 44.2 43.9 44.1 48.5  MCV 88.1 89.8 90.1 88.4 91.0  PLT 324 199 174 163 405*    Cardiac Enzymes: No results found for this basename: CKTOTAL, CKMB, CKMBINDEX, TROPONINI,  in the last 168 hours BNP (last 3 results)  Recent Labs  08/07/13 1600 08/23/13 1759 11/15/13 1951  PROBNP 13375.0* 9383.0* 9486.0*   CBG:  Recent Labs Lab 11/20/13 0817 11/20/13 1229 11/20/13 1544 11/20/13 1950 11/24/13 1149  GLUCAP 68* 91 97 83 88    Recent Results (from the past 240 hour(s))  MRSA PCR SCREENING     Status: Abnormal   Collection Time    11/15/13  6:02 PM      Result Value Ref Range Status   MRSA by PCR POSITIVE (*) NEGATIVE Final   Comment:            The GeneXpert MRSA Assay (FDA     approved for NASAL specimens     only), is one component of a     comprehensive MRSA colonization     surveillance program. It is not     intended to diagnose MRSA     infection nor to guide  or     monitor treatment for     MRSA infections.     RESULT CALLED TO, READ BACK BY AND VERIFIED WITH:     D.TURNER AT 2106 11/15/13  CULTURE, BLOOD (ROUTINE X 2)     Status: None   Collection Time    11/15/13  8:37 PM      Result Value Ref Range Status   Specimen Description BLOOD LEFT ARM   Final   Special Requests BOTTLES DRAWN AEROBIC ONLY 8CC   Final   Culture  Setup Time     Final   Value: 11/16/2013 04:13     Performed at [facility]   Culture     Final   Value: NO GROWTH 5 DAYS     Performed at [facility]   Report Status 11/22/2013 FINAL   Final  URINE CULTURE     Status: None   Collection Time    11/15/13 10:32 PM      Result Value Ref Range Status   Specimen Description URINE, CATHETERIZED   Final   Special Requests NONE   Final   Culture  Setup Time     Final   Value: 11/16/2013 00:55     Performed at [place]     Final   Value: 3,000 COLONIES/ML     Performed at [facility]   Culture     Final   Value: INSIGNIFICANT GROWTH     Performed at [facility]   Report Status 11/17/2013 FINAL   Final   CULTURE, RESPIRATORY (NON-EXPECTORATED)     Status: None   Collection Time    11/15/13 11:30 PM      Result Value Ref Range Status   Specimen Description TRACHEAL ASPIRATE   Final   Special Requests NONE   Final   Gram Stain     Final   Value: MODERATE WBC PRESENT,BOTH PMN AND MONONUCLEAR     RARE SQUAMOUS EPITHELIAL CELLS PRESENT     RARE GRAM POSITIVE COCCI     IN PAIRS RARE GRAM POSITIVE RODS     Performed at [facility]   Culture     Final   Value: Non-Pathogenic Oropharyngeal-type Flora Isolated.     Performed at [facility]   Report Status 11/18/2013 FINAL   Final  CULTURE, BLOOD (ROUTINE X 2)     Status: None   Collection Time    11/16/13  8:45 AM      Result Value Ref Range Status   Specimen Description BLOOD LEFT ANTECUBITAL   Final   Special Requests BOTTLES DRAWN AEROBIC ONLY 4CC   Final   Culture  Setup Time     Final   Value: 11/16/2013 14:27     Performed at [facility]   Culture     Final   Value: NO GROWTH 5 DAYS     Performed at [facility]   Report Status 11/22/2013 FINAL   Final     Studies:  Recent x-ray studies have been reviewed in detail by the Attending Physician  Scheduled Meds:  Scheduled Meds: . aspirin  325 mg Oral Daily  . atorvastatin  80 mg Oral q1800  . bacitracin   Topical BID  . cefTRIAXone (ROCEPHIN)  IV  1 g Intravenous Q24H  . enoxaparin (LOVENOX) injection  55 mg Subcutaneous Daily  . feeding supplement (ENSURE COMPLETE)  237 mL Oral BID BM  .  Gerhardt's butt cream   Topical BID  . senna-docusate  1 tablet Oral QHS  . spironolactone  25 mg Oral Daily   Time spent on care of this patient: 20 mins  Delfina Redwood , MD  [facility] [phone]  If 7PM-7AM, please contact night-coverage [email or web address] Password [email or web address] 11/25/2013, 1:05 PM   LOS: 10 days

## 2013-11-25 NOTE — Progress Notes (Signed)
Nurse initial am assessment;  Pt is more difficult to keep oriented than yesterday but easily redirected.  Pt systolic BP remains in 88'Q.  MD contacted and will quickly assess pt.

## 2013-11-25 NOTE — Progress Notes (Signed)
ADVANCED HEART FAILURE ROUNDING NOTE DAILY PROGRESS NOTE  Subjective:   Lasix/metolazone  stopped and lisinopril held yesterday due to SBP in 80s. BP 105 last night and lisinopril given. Now SBP in 80s again. Weight down another 2 pounds. (22 pounds total)   Advanced Directives conversation initiated 9/09 by Dr Alva Garnet. Palliative Care has seen. Remains Full Code.   Denies SOB/CP. CVP 12.  Says he feels great but just not hungry. No fevers/chills. Seems mildly confused at times.     Objective:  Temp:  [96.7 F (35.9 C)-97.6 F (36.4 C)] 96.7 F (35.9 C) (09/13 0300) Pulse Rate:  [71-98] 90 (09/13 0800) Resp:  [10-19] 11 (09/13 0800) BP: (82-103)/(53-70) 85/54 mmHg (09/13 0800) SpO2:  [88 %-100 %] 88 % (09/13 0600) Weight:  [107.956 kg (238 lb)] 107.956 kg (238 lb) (09/13 0502) Weight change: 8.165 kg (18 lb)  Intake/Output from previous day: 09/12 0701 - 09/13 0700 In: 360 [P.O.:360] Out: 3950 [Urine:3950]  Intake/Output from this shift:    Medications: Current Facility-Administered Medications  Medication Dose Route Frequency Provider Last Rate Last Dose  . 0.9 %  sodium chloride infusion  250 mL Intravenous PRN Corey Harold, NP 10 mL/hr at 11/18/13 1000 250 mL at 11/18/13 1000  . acetaZOLAMIDE (DIAMOX) tablet 500 mg  500 mg Oral BID Delfina Redwood, MD   500 mg at 11/24/13 2238  . albuterol (PROVENTIL) (2.5 MG/3ML) 0.083% nebulizer solution 2.5 mg  2.5 mg Nebulization Q4H PRN Wilhelmina Mcardle, MD      . aspirin tablet 325 mg  325 mg Oral Daily Doree Fudge, MD   325 mg at 11/24/13 1126  . atorvastatin (LIPITOR) tablet 80 mg  80 mg Oral q1800 Doree Fudge, MD   80 mg at 11/24/13 1711  . bacitracin ointment   Topical BID Allie Bossier, MD      . bisacodyl (DULCOLAX) suppository 10 mg  10 mg Rectal Daily PRN Acquanetta Chain, DO      . docusate sodium (COLACE) capsule 100 mg  100 mg Oral Daily PRN Melvenia Needles, NP   100 mg at 11/18/13  0545  . enoxaparin (LOVENOX) injection 55 mg  55 mg Subcutaneous Daily Jolaine Artist, MD   55 mg at 11/24/13 1127  . feeding supplement (ENSURE COMPLETE) (ENSURE COMPLETE) liquid 237 mL  237 mL Oral BID BM Rogue Bussing, RD   237 mL at 11/24/13 1429  . fentaNYL (SUBLIMAZE) injection 25-50 mcg  25-50 mcg Intravenous Q2H PRN Wilhelmina Mcardle, MD   50 mcg at 11/22/13 1308  . Gerhardt's butt cream   Topical BID Acquanetta Chain, DO      . lisinopril (PRINIVIL,ZESTRIL) tablet 2.5 mg  2.5 mg Oral BID Larey Dresser, MD   2.5 mg at 11/24/13 2020  . oxyCODONE (Oxy IR/ROXICODONE) immediate release tablet 5-10 mg  5-10 mg Oral Q4H PRN Samella Parr, NP   10 mg at 11/24/13 2038  . potassium chloride SA (K-DUR,KLOR-CON) CR tablet 20 mEq  20 mEq Oral BID Delfina Redwood, MD   20 mEq at 11/24/13 2240  . senna-docusate (Senokot-S) tablet 1 tablet  1 tablet Oral QHS Acquanetta Chain, DO   1 tablet at 11/24/13 2240  . spironolactone (ALDACTONE) tablet 25 mg  25 mg Oral Daily Jolaine Artist, MD   25 mg at 11/24/13 1127    Physical Exam: CVP 10-12 General:  Chronically ill-appearing. No resp difficulty HEENT: normal  Neck: supple. JVP up Carotids 2+ bilat; no bruits. RIJ  TLC Cor: RRR distant Lungs: clear anteriorly Abdomen: markedly obese mildly distended.  Good bowel sounds. Extremities: no cyanosis, clubbing, rash, tr-1+ edema in LEs . +LE erythema  penile and scrotal edema resolving . decubitus ulcers covered. Gangrenous toes R foot   Neuro: alert & orientedx3, cranial nerves grossly intact. moves all 4 extremities w/o difficulty. Affect pleasant   Lab Results: Results for orders placed during the hospital encounter of 11/15/13 (from the past 48 hour(s))  MAGNESIUM     Status: None   Collection Time    11/24/13  6:27 AM      Result Value Ref Range   Magnesium 1.5  1.5 - 2.5 mg/dL  BASIC METABOLIC PANEL     Status: Abnormal   Collection Time    11/24/13  6:27 AM       Result Value Ref Range   Sodium 141  137 - 147 mEq/L   Potassium 4.2  3.7 - 5.3 mEq/L   Chloride 91 (*) 96 - 112 mEq/L   CO2 44 (*) 19 - 32 mEq/L   Comment: CRITICAL RESULT CALLED TO, READ BACK BY AND VERIFIED WITH:     [facility] Hearing [id number] WILDERK   Glucose, Bld 86  70 - 99 mg/dL   BUN 21  6 - 23 mg/dL   Creatinine, Ser 0.80  0.50 - 1.35 mg/dL   Calcium 9.3  8.4 - 10.5 mg/dL   GFR calc non Af Amer >90  >90 mL/min   GFR calc Af Amer >90  >90 mL/min   Comment: (NOTE)     The eGFR has been calculated using the CKD EPI equation.     This calculation has not been validated in all clinical situations.     eGFR's persistently <90 mL/min signify possible Chronic Kidney     Disease.   Anion gap 6  5 - 15  CARBOXYHEMOGLOBIN     Status: Abnormal   Collection Time    11/24/13  6:46 AM      Result Value Ref Range   Total hemoglobin 15.3  13.5 - 18.0 g/dL   O2 Saturation 81.6     Carboxyhemoglobin 2.0 (*) 0.5 - 1.5 %   Methemoglobin 0.6  0.0 - 1.5 %  GLUCOSE, CAPILLARY     Status: None   Collection Time    11/24/13 11:49 AM      Result Value Ref Range   Glucose-Capillary 88  70 - 99 mg/dL  BASIC METABOLIC PANEL     Status: Abnormal   Collection Time    11/25/13  5:00 AM      Result Value Ref Range   Sodium 136 (*) 137 - 147 mEq/L   Potassium 4.7  3.7 - 5.3 mEq/L   Chloride 86 (*) 96 - 112 mEq/L   CO2 44 (*) 19 - 32 mEq/L   Comment: CRITICAL RESULT CALLED TO, READ BACK BY AND VERIFIED WITH:     T.SALVAGE RN [id number] 11/25/13 E.GADDY   Glucose, Bld 85  70 - 99 mg/dL   BUN 21  6 - 23 mg/dL   Creatinine, Ser 0.83  0.50 - 1.35 mg/dL   Calcium 9.3  8.4 - 10.5 mg/dL   GFR calc non Af Amer >90  >90 mL/min   GFR calc Af Amer >90  >90 mL/min   Comment: (NOTE)     The eGFR has been calculated using the CKD EPI equation.  This calculation has not been validated in all clinical situations.     eGFR's persistently <90 mL/min signify possible Chronic Kidney     Disease.   Anion gap 6  5 -  15  MAGNESIUM     Status: None   Collection Time    11/25/13  5:00 AM      Result Value Ref Range   Magnesium 2.2  1.5 - 2.5 mg/dL    Imaging: No current studies  Assessment: 1. 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  Not LVAD candidate with severe RV failure and gangrene.      --EF approximately 35%, RV severely dilated. Difficult study.  2. Anasarca 3. CAD s/p out-of -hospital LAD infarct 5/15, patient also had likely RV infarction.      --LAD and RCA 100% occluded. Lcx 30% EF 20% -> medical management 4. 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 PAD, severe.  Dry gangrene right foot.  6. Morbid obesity 7. Acute on chronic respiratory failure 8. Severe decubiti 9. C difficile colitis: He has completed treatment.   10. Acute kidney injury, improved 11. Protein-calorie malnutrition 12. RUE Edema.  13. Hypomagnesemia 14. hypotension  Plan/discussion:  BP low again today. Will stop all diuretics and ACE. CVP still slightly elevated but given RV failure he is likely preload dependent. Need to watch closely for developing infection/sepsis. Will check CBC and UA.   Daniel Bensimhon,MD 8:07 AM

## 2013-11-26 ENCOUNTER — Encounter (HOSPITAL_COMMUNITY): Payer: Self-pay | Admitting: *Deleted

## 2013-11-26 DIAGNOSIS — N390 Urinary tract infection, site not specified: Secondary | ICD-10-CM

## 2013-11-26 LAB — CARBOXYHEMOGLOBIN
Carboxyhemoglobin: 2.2 % — ABNORMAL HIGH (ref 0.5–1.5)
Methemoglobin: 0.7 % (ref 0.0–1.5)
O2 Saturation: 83.2 %
Total hemoglobin: 14.5 g/dL (ref 13.5–18.0)

## 2013-11-26 LAB — BASIC METABOLIC PANEL
Anion gap: 9 (ref 5–15)
BUN: 21 mg/dL (ref 6–23)
CO2: 39 meq/L — AB (ref 19–32)
CREATININE: 0.87 mg/dL (ref 0.50–1.35)
Calcium: 9 mg/dL (ref 8.4–10.5)
Chloride: 90 mEq/L — ABNORMAL LOW (ref 96–112)
GFR calc Af Amer: >90 mL/min (ref >90)
GFR calc non Af Amer: 89 mL/min — ABNORMAL LOW (ref >90)
GLUCOSE: 81 mg/dL (ref 70–99)
Potassium: 5 mEq/L (ref 3.7–5.3)
Sodium: 138 mEq/L (ref 137–147)

## 2013-11-26 LAB — MAGNESIUM: MAGNESIUM: 2.2 mg/dL (ref 1.5–2.5)

## 2013-11-26 MED ORDER — INFLUENZA VAC SPLIT QUAD 0.5 ML IM SUSY
0.5000 mL | PREFILLED_SYRINGE | INTRAMUSCULAR | Status: DC
  Filled 2013-11-26: qty 0.5

## 2013-11-26 MED ORDER — ALBUTEROL SULFATE (2.5 MG/3ML) 0.083% IN NEBU
2.5000 mg | INHALATION_SOLUTION | RESPIRATORY_TRACT | Status: DC | PRN
Start: 2013-11-26 — End: 2013-12-04

## 2013-11-26 MED ORDER — FUROSEMIDE 20 MG PO TABS
60.0000 mg | ORAL_TABLET | Freq: Every day | ORAL | Status: DC
  Administered 2013-11-26: 60 mg via ORAL
  Filled 2013-11-26 (×2): qty 1

## 2013-11-26 NOTE — Progress Notes (Signed)
Chaplain visited with pt concerning creating Advanced Directive. Chaplain also engaged in exploring family dynamics, concerns, and positive means of engagement. Fernando Lane is in a positive mood and very engaging. Advanced Directive completed and copies given to pt with instructions and copy given to secretary for filing. Page if needed.  Fernando Lane 11/26/2013 10:35 AM [phone]

## 2013-11-26 NOTE — Progress Notes (Signed)
[facility] TEAM 1 - Stepdown/ICU TEAM Progress Note  Fernando Lane NGE:[id number] DOB: [birth year] DOA: 11/15/2013 PCP: Gwendolyn Grant, MD  Admit HPI / Brief Narrative: 63 year old M SNF resident w/ Hx polycythemia vera, OSA on nocturnal CPAP, CHF, and CAD (2 vessel disease with medical management only) who developed SOB 9/3 am. He was transported to [facility] ED with septic shock, and was intubated, and placed on pressors. Transferred to [facility] ICU 9/03, per PCCM. Diagnosed with C diff prior to admission.  Transferred to [facility] 9/10.  Significant Events: 9/3 Intubated in [place] ED, transferred to [facility]  9/4 Extubated. Trop I elevated @ 2.63  9/04 WOC consult: Entire buttocks and posterior scrotum with erythremia and patchy areas of skin loss. Right foot is purple and mottled, some toes with dry intact eschar. Barrier cream to scrotum and buttocks to protect, repel moisture, and promote healing. Foam dressing to outer leg wound. If aggressive plan of care is desired to right foot, please consult VVS team for further plan of care.  9/04 VVS Consult: This is a 63 y.o. 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  -foot with gangrene, but doubt this is the source of his sepsis. Plavix discontinued in anticipation for possible surgery  9/4 TTE: Limited study, akinetic apex with unusual appearance, cannot rule out distal muscular VSD, mildly dilated RA, severely dilated RV, moderately dilated RA moderate TR, no RVSP estimate given  9/05 Cards Consult: consider cardiac MRI to further elucidate the abnormal LV anatomy and clarify cause of RV dilation  9/07 Weaning off vasopressors  9/08 Off vasopressors. Diuresis ordered for severe hypervolemia. Discussed with Dr Debara Pickett re: Echo findings of severe RV dilation. Consider RHC when more stabilized.  9/08 Urology consult: see full note. Rec leaving Foley cath until penile and scrotal edema resolve  9/08 PM: Furosemide  gtt initiated by Cardiology  9/09 Advanced Directives conversation initiated 9/09 by Dr Alva Garnet. Advised no CPR/ACLS and DNI or short term intubation only. Palliative Care consultation requested. Transfer to SDU ordered. TRH to assume care as of AM 9/10 and PCCM to sign off  HPI/Subjective: Pt appears to be resting comfortably. No new complaints today.    Assessment/Plan:  Septic shock Completed abx for C diff.  Blood pressure has been low, but patient asymptomatic.  Palliative care following. Remains full code/full care at this time.   UTI UA shows UTI and WBC up 18K compared to 13 K a few days ago.  Giving short course of ceftriaxone and watching for signs of C diff recurrence.  Ordered urine culture. Urology had to place foley. Last note says "leave in for now ... until edema improves".   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  MRSA screen +  Code Status: FULL Family Communication: no family present at time of exam today  Disposition Plan: continue  SDU. Eventual SNF  Consultants: Dr. Loralie Champagne (cardiology; heart failure) Dr. Merton [facility] M[facility] Dr. Ruta Hinds (vascular surgery) PMT  Antibiotics: Vanc 9/03 >> 9/07  Pip-tazo 9/03 >> 9/07  Fidaxomicin 8/31 >> 9/09 Ceftriaxone 9/13 >  DVT prophylaxis: Lovenox  LINES / TUBES:  ETT 9/03 >> 9/04  R IJ CVL 9/03 >>   Objective: Blood pressure 99/55, pulse 96, temperature 97.8 F (36.6 C), temperature source Oral, resp. rate 16, height 5\' 6"  (1.676 m), weight 91.2 kg (201 lb 1 oz), SpO2 91.00%.  Intake/Output Summary (Last 24 hours) at 11/26/13 1149 Last data filed at 11/26/13 0415  Gross per 24 hour  Intake      0 ml  Output   1625 ml  Net  -1625 ml   Exam: General: no acute resp distress  Lungs: clear to auscultation bilaterally without wheezes or crackles Cardiovascular: regular rate and rhythm without murmur gallop or rub Abdomen: Nontender, nondistended, soft, bowel sounds positive, no rebound GU: foley Extremities: fourth right toe dry gangrene - dressind right leg CDI  Data Reviewed: Basic Metabolic Panel:  Recent Labs Lab 11/22/13 0440 11/23/13 0500 11/24/13 0627 11/25/13 0500 11/26/13 0440  NA 142 143 141 136* 138  K 3.8 4.1 4.2 4.7 5.0  CL 102 95* 91* 86* 90*  CO2 29 37* 44* 44* 39*  GLUCOSE 76 81 86 85 81  BUN 30* 25* 21 21 21   CREATININE 0.86 0.83 0.80 0.83 0.87  CALCIUM 8.5 9.0 9.3 9.3 9.0  MG  --  1.4* 1.5 2.2 2.2   Liver Function Tests:  Recent Labs Lab 11/20/13 0500  AST 8  ALT 9  ALKPHOS 102  BILITOT 0.6  PROT 5.0*  ALBUMIN 2.4*   CBC:  Recent Labs Lab 11/20/13 0500 11/21/13 0500 11/22/13 0440 11/25/13 1020  WBC 13.8* 14.2* 14.9* 18.0*  NEUTROABS  --   --   --  16.1*  HGB 14.0 13.7 14.0 15.1  HCT 44.2 43.9 44.1 48.5  MCV 89.8 90.1 88.4 91.0  PLT 199 174 163 405*   BNP (last 3 results)  Recent Labs  08/07/13 1600 08/23/13 1759 11/15/13 1951  PROBNP 13375.0* 9383.0* 9486.0*   CBG:  Recent Labs Lab  11/20/13 0817 11/20/13 1229 11/20/13 1544 11/20/13 1950 11/24/13 1149  GLUCAP 68* 91 97 83 88   Studies:  Recent x-ray studies have been reviewed in detail by the Attending Physician  Scheduled Meds:  Scheduled Meds: . aspirin  325 mg Oral Daily  . atorvastatin  80 mg Oral q1800  . bacitracin   Topical BID  . cefTRIAXone (ROCEPHIN)  IV  1 g Intravenous Q24H  . enoxaparin (LOVENOX) injection  55 mg Subcutaneous Daily  . feeding supplement (ENSURE COMPLETE)  237 mL Oral BID BM  . furosemide  60 mg Oral Daily  . Gerhardt's butt cream   Topical BID  . senna-docusate  1 tablet Oral QHS  . spironolactone  25 mg Oral Daily   Time spent on care of this patient: 35 mins  Cherene Altes, MD [facility] For Consults/Admissions - Flow Manager - [phone] Office  [phone] Pager [phone]  On-Call/Text Page:  [email or web address]      password [facility]  11/26/2013, 11:49 AM   LOS: 11 days

## 2013-11-26 NOTE — Clinical Social Work Note (Signed)
CSW spoke with [facility].  Representative at [employer] requested that clinicals be sent following the patients amputation so that they can better assess if patient is appropriate for [facility]'s level of care.  CSW will continue to follow.  Domenica Reamer, [place] Social Worker [phone]

## 2013-11-26 NOTE — Consult Note (Signed)
[facility]  Subjective  - some pain in foot no worse, wants to start walking   Objective 99/55 96 97.8 F (36.6 C) (Oral) 16 91%  Intake/Output Summary (Last 24 hours) at 11/26/13 0830 Last data filed at 11/26/13 0415  Gross per 24 hour  Intake      0 ml  Output   1625 ml  Net  -1625 ml   Right and left foot dusky right slightly more than left, dry gangrene right fourth toe stable  Assessment/Planning: Feet are more dusky than last week symmetrically most likely this represents continued lack of perfusion due to central cardiac issues not progression of lower extremity occlusive disease.  All large vessels were patent on recent arteriogram.    Although he has dry gangrenous changes on his right toe this has been unchanged since admission to hospital.  Will consider amputation of toe once his cardiac and infection issues are stable.  Would benefit from walking if stable enough for this  Will recheck next week.  Call if questions.  Neysa Arts E 11/26/2013 8:30 AM --  Laboratory Lab Results:  Recent Labs  11/25/13 1020  WBC 18.0*  HGB 15.1  HCT 48.5  PLT 405*   BMET  Recent Labs  11/25/13 0500 11/26/13 0440  NA 136* 138  K 4.7 5.0  CL 86* 90*  CO2 44* 39*  GLUCOSE 85 81  BUN 21 21  CREATININE 0.83 0.87  CALCIUM 9.3 9.0    COAG Lab Results  Component Value Date   INR 1.59* 11/15/2013   INR 1.24 08/23/2013   INR 1.55* 08/08/2013   No results found for this basename: PTT

## 2013-11-26 NOTE — Progress Notes (Signed)
11/26/2013 CVP at 1430 was 5. [facility] RN.

## 2013-11-26 NOTE — Progress Notes (Signed)
Physical Therapy Treatment Patient Details Name: JAQUAVION MCCANNON MRN: [id number] DOB: [birth year] Today's Date: 11/26/2013    History of Present Illness 63 yo male admitted from SNF in [place] after recent hospital admission for ischemic Rt LE and Nstemi. pt s/p fall at snf with new spleen contusion and Rib fx. Pt now readmitted with HTN, sepsis due to pyelonephritis, and metabolic encephalopathy.    PT Comments    Pt indicates fatigued this am from having been in recliner since 3am.  Pt able to take small side steps to return to bed, however fatigues quickly and requires A to control descent to sitting on bed.  BP stable and no c/o dizziness.  Will continue to follow.    Follow Up Recommendations  SNF;Supervision/Assistance - 24 hour     Equipment Recommendations  None recommended by PT    Recommendations for Other Services       Precautions / Restrictions Precautions Precautions: Fall Restrictions Weight Bearing Restrictions: No    Mobility  Bed Mobility Overal bed mobility: Needs Assistance;+2 for physical assistance Bed Mobility: Sit to Supine       Sit to supine: Min assist;+2 for physical assistance   General bed mobility comments: cues for sequencing and encouragement for pt to try as much as he can do without A.    Transfers Overall transfer level: Needs assistance Equipment used: Rolling walker (2 wheeled) Transfers: Sit to/from Stand Sit to Stand: Mod assist;+2 physical assistance         General transfer comment: cues for UE use.  pt needs A to power up to standing and to control descent to sitting.    Ambulation/Gait Ambulation/Gait assistance: Min assist;+2 physical assistance Ambulation Distance (Feet): 3 Feet Assistive device: Rolling walker (2 wheeled) Gait Pattern/deviations: Step-through pattern;Decreased stride length     General Gait Details: pt took side steps frmo recliner to bed.  cues for use of RW, upright posture, and encouragement.   pt fatigues quickly and begins to sit prematurely needing A to control descent to bed.     Stairs            Wheelchair Mobility    Modified Rankin (Stroke Patients Only)       Balance                                    Cognition Arousal/Alertness: Awake/alert Behavior During Therapy: WFL for tasks assessed/performed Overall Cognitive Status: Within Functional Limits for tasks assessed                      Exercises      General Comments General comments (skin integrity, edema, etc.): pt ed on up to chair again later today with Nsg A and for pt to work on LE ther ex while in bed or chair.        Pertinent Vitals/Pain Pain Assessment: No/denies pain    Home Living                      Prior Function            PT Goals (current goals can now be found in the care plan section) Acute Rehab PT Goals PT Goal Formulation: With patient Time For Goal Achievement: 12/03/13 Potential to Achieve Goals: Fair Progress towards PT goals: Progressing toward goals    Frequency  Min 2X/week    PT  Plan Current plan remains appropriate    Co-evaluation             End of Session Equipment Utilized During Treatment: Gait belt;Oxygen Activity Tolerance: Patient limited by fatigue Patient left: in bed;with call bell/phone within reach     Time: 1041-1056 PT Time Calculation (min): 15 min  Charges:  $Therapeutic Activity: 8-22 mins                    G CodesCatarina Hartshorn, [place] 11/26/2013, 11:17 AM

## 2013-11-26 NOTE — Progress Notes (Signed)
Speech Language Pathology Treatment: Dysphagia  Patient Details Name: Fernando Lane MRN: [id number] DOB: 1950-04-28 Today's Date: 11/26/2013 Time: 8502-7741 SLP Time Calculation (min): 16 min  Assessment / Plan / Recommendation Clinical Impression  Pt. seen for dysphagia intervention for potential to progress to regular textures and continued tolerance of thin liquids.  Mild delayed cough at end of session, not concerning for aspiration.  Oral phase functional with regular texture.  SLP reviewed safe swallow strategies. Upgrade to regular consistency and continue thin liquids; no further ST needed at this time.  SLP educated pt. to hospital menu for self ordering.   HPI HPI: 63 year old male with PMH:  polycythemia vera, OSA (nocturnal CPAP), CHF, and CAD from SNF aditted with SOB. Intubated 9/3-9/4. CXR stable bibasilar opacities and layering effusions. Low lung volumes. Cardiomegaly, vascular congestion.  Found to have hypovolemia, sepsis, cardiogenic.   Pertinent Vitals Pain Assessment: No/denies pain  SLP Plan  All goals met;Discharge SLP treatment due to (comment) (goals met)    Recommendations Diet recommendations: Regular;Thin liquid Liquids provided via: Cup;Straw Medication Administration: Whole meds with puree Supervision: Patient able to self feed Compensations: Slow rate;Small sips/bites Postural Changes and/or Swallow Maneuvers: Seated upright 90 degrees              Oral Care Recommendations: Oral care BID Follow up Recommendations: None Plan: All goals met;Discharge SLP treatment due to (comment) (goals met)    GO     Houston Siren 11/26/2013, 2:26 PM  Orbie Pyo Colvin Caroli.Ed [employer] [phone]

## 2013-11-26 NOTE — Progress Notes (Addendum)
Patient ID: Fernando Lane, male   DOB: [birth year], 63 y.o.   MRN: [id number] ADVANCED HEART FAILURE ROUNDING NOTE DAILY PROGRESS NOTE  Subjective:  Lasix/metolazone  stopped and lisinopril held due to SBP in 80s. SBP in 100s now.  Weight inaccurate this morning (he did not lose 37 lbs).   Denies SOB/CP. CVP 9.  Up in chair.  Started on ceftriaxone yesterday for UTI.    Advanced Directives conversation initiated 9/09 by Dr Alva Garnet. Palliative Care has seen. Remains Full Code.   Objective:  Temp:  [97.4 F (36.3 C)-98.4 F (36.9 C)] 98 F (36.7 C) (09/14 0410) Pulse Rate:  [84-98] 97 (09/14 0410) Resp:  [7-21] 16 (09/14 0410) BP: (76-115)/(54-87) 106/72 mmHg (09/14 0410) SpO2:  [86 %-100 %] 96 % (09/14 0410) Weight:  [201 lb 1 oz (91.2 kg)] 201 lb 1 oz (91.2 kg) (09/14 0410) Weight change: -36 lb 15.1 oz (-16.756 kg)  Intake/Output from previous day: 09/13 0701 - 09/14 0700 In: -  Out: 1625 [Urine:1625]  Intake/Output from this shift:    Medications: Current Facility-Administered Medications  Medication Dose Route Frequency Provider Last Rate Last Dose  . 0.9 %  sodium chloride infusion  250 mL Intravenous PRN Corey Harold, NP 10 mL/hr at 11/18/13 1000 250 mL at 11/18/13 1000  . albuterol (PROVENTIL) (2.5 MG/3ML) 0.083% nebulizer solution 2.5 mg  2.5 mg Nebulization Q4H PRN Wilhelmina Mcardle, MD      . aspirin tablet 325 mg  325 mg Oral Daily Doree Fudge, MD   325 mg at 11/25/13 1022  . atorvastatin (LIPITOR) tablet 80 mg  80 mg Oral q1800 Doree Fudge, MD   80 mg at 11/25/13 1904  . bacitracin ointment   Topical BID Allie Bossier, MD   1 application at 08/65/78 2259  . bisacodyl (DULCOLAX) suppository 10 mg  10 mg Rectal Daily PRN Acquanetta Chain, DO      . cefTRIAXone (ROCEPHIN) 1 g in dextrose 5 % 50 mL IVPB  1 g Intravenous Q24H Delfina Redwood, MD   1 g at 11/25/13 1431  . docusate sodium (COLACE) capsule 100 mg  100 mg Oral Daily PRN  Melvenia Needles, NP   100 mg at 11/18/13 0545  . enoxaparin (LOVENOX) injection 55 mg  55 mg Subcutaneous Daily Jolaine Artist, MD   55 mg at 11/25/13 1017  . feeding supplement (ENSURE COMPLETE) (ENSURE COMPLETE) liquid 237 mL  237 mL Oral BID BM Rogue Bussing, RD   237 mL at 11/25/13 1431  . fentaNYL (SUBLIMAZE) injection 25-50 mcg  25-50 mcg Intravenous Q2H PRN Wilhelmina Mcardle, MD   50 mcg at 11/22/13 1308  . furosemide (LASIX) tablet 60 mg  60 mg Oral Daily Larey Dresser, MD      . Gerhardt's butt cream   Topical BID Acquanetta Chain, DO      . oxyCODONE (Oxy IR/ROXICODONE) immediate release tablet 5-10 mg  5-10 mg Oral Q4H PRN Samella Parr, NP   10 mg at 11/24/13 2038  . senna-docusate (Senokot-S) tablet 1 tablet  1 tablet Oral QHS Acquanetta Chain, DO   1 tablet at 11/25/13 2259  . spironolactone (ALDACTONE) tablet 25 mg  25 mg Oral Daily Jolaine Artist, MD   25 mg at 11/25/13 1017    Physical Exam: CVP 10-12 General:  Chronically ill-appearing. No resp difficulty HEENT: normal Neck: supple. JVP ?8-9 cm. Carotids 2+ bilat; no bruits. RIJ  TLC Cor: RRR distant Lungs: clear anteriorly Abdomen: markedly obese mildly distended.  Good bowel sounds. Extremities: no cyanosis, clubbing, rash.  1+ edema 1/2 up lower legs bilaterally. +LE erythema  penile and scrotal edema resolving . decubitus ulcers covered. Gangrenous toes R foot   Neuro: alert & orientedx3, cranial nerves grossly intact. moves all 4 extremities w/o difficulty. Affect pleasant   Lab Results: Results for orders placed during the hospital encounter of 11/15/13 (from the past 48 hour(s))  GLUCOSE, CAPILLARY     Status: None   Collection Time    11/24/13 11:49 AM      Result Value Ref Range   Glucose-Capillary 88  70 - 99 mg/dL  BASIC METABOLIC PANEL     Status: Abnormal   Collection Time    11/25/13  5:00 AM      Result Value Ref Range   Sodium 136 (*) 137 - 147 mEq/L   Potassium 4.7  3.7 -  5.3 mEq/L   Chloride 86 (*) 96 - 112 mEq/L   CO2 44 (*) 19 - 32 mEq/L   Comment: CRITICAL RESULT CALLED TO, READ BACK BY AND VERIFIED WITH:     T.SALVAGE RN 1610 11/25/13 E.GADDY   Glucose, Bld 85  70 - 99 mg/dL   BUN 21  6 - 23 mg/dL   Creatinine, Ser 0.83  0.50 - 1.35 mg/dL   Calcium 9.3  8.4 - 10.5 mg/dL   GFR calc non Af Amer >90  >90 mL/min   GFR calc Af Amer >90  >90 mL/min   Comment: (NOTE)     The eGFR has been calculated using the CKD EPI equation.     This calculation has not been validated in all clinical situations.     eGFR's persistently <90 mL/min signify possible Chronic Kidney     Disease.   Anion gap 6  5 - 15  MAGNESIUM     Status: None   Collection Time    11/25/13  5:00 AM      Result Value Ref Range   Magnesium 2.2  1.5 - 2.5 mg/dL  URINALYSIS, ROUTINE W REFLEX MICROSCOPIC     Status: Abnormal   Collection Time    11/25/13 10:20 AM      Result Value Ref Range   Color, Urine AMBER (*) YELLOW   Comment: BIOCHEMICALS MAY BE AFFECTED BY COLOR   APPearance CLOUDY (*) CLEAR   Specific Gravity, Urine 1.013  1.005 - 1.030   pH 8.5 (*) 5.0 - 8.0   Glucose, UA NEGATIVE  NEGATIVE mg/dL   Hgb urine dipstick LARGE (*) NEGATIVE   Bilirubin Urine NEGATIVE  NEGATIVE   Ketones, ur NEGATIVE  NEGATIVE mg/dL   Protein, ur 30 (*) NEGATIVE mg/dL   Urobilinogen, UA 0.2  0.0 - 1.0 mg/dL   Nitrite NEGATIVE  NEGATIVE   Leukocytes, UA LARGE (*) NEGATIVE  CBC WITH DIFFERENTIAL     Status: Abnormal   Collection Time    11/25/13 10:20 AM      Result Value Ref Range   WBC 18.0 (*) 4.0 - 10.5 K/uL   RBC 5.33  4.22 - 5.81 MIL/uL   Hemoglobin 15.1  13.0 - 17.0 g/dL   HCT 48.5  39.0 - 52.0 %   MCV 91.0  78.0 - 100.0 fL   MCH 28.3  26.0 - 34.0 pg   MCHC 31.1  30.0 - 36.0 g/dL   RDW 17.8 (*) 11.5 - 15.5 %   Platelets 405 (*)  150 - 400 K/uL   Neutrophils Relative % 90 (*) 43 - 77 %   Neutro Abs 16.1 (*) 1.7 - 7.7 K/uL   Lymphocytes Relative 6 (*) 12 - 46 %   Lymphs Abs 1.0  0.7  - 4.0 K/uL   Monocytes Relative 2 (*) 3 - 12 %   Monocytes Absolute 0.4  0.1 - 1.0 K/uL   Eosinophils Relative 2  0 - 5 %   Eosinophils Absolute 0.4  0.0 - 0.7 K/uL   Basophils Relative 0  0 - 1 %   Basophils Absolute 0.0  0.0 - 0.1 K/uL  URINE MICROSCOPIC-ADD ON     Status: None   Collection Time    11/25/13 10:20 AM      Result Value Ref Range   Squamous Epithelial / LPF RARE  RARE   WBC, UA TOO NUMEROUS TO COUNT  <3 WBC/hpf   RBC / HPF 21-50  <3 RBC/hpf   Bacteria, UA RARE  RARE   Urine-Other FEW YEAST    CARBOXYHEMOGLOBIN     Status: Abnormal   Collection Time    11/26/13  4:30 AM      Result Value Ref Range   Total hemoglobin 14.5  13.5 - 18.0 g/dL   O2 Saturation 83.2     Carboxyhemoglobin 2.2 (*) 0.5 - 1.5 %   Methemoglobin 0.7  0.0 - 1.5 %  BASIC METABOLIC PANEL     Status: Abnormal   Collection Time    11/26/13  4:40 AM      Result Value Ref Range   Sodium 138  137 - 147 mEq/L   Potassium 5.0  3.7 - 5.3 mEq/L   Comment: HEMOLYSIS AT THIS LEVEL MAY AFFECT RESULT   Chloride 90 (*) 96 - 112 mEq/L   CO2 39 (*) 19 - 32 mEq/L   Glucose, Bld 81  70 - 99 mg/dL   BUN 21  6 - 23 mg/dL   Creatinine, Ser 0.87  0.50 - 1.35 mg/dL   Calcium 9.0  8.4 - 10.5 mg/dL   GFR calc non Af Amer 89 (*) >90 mL/min   GFR calc Af Amer >90  >90 mL/min   Comment: (NOTE)     The eGFR has been calculated using the CKD EPI equation.     This calculation has not been validated in all clinical situations.     eGFR's persistently <90 mL/min signify possible Chronic Kidney     Disease.   Anion gap 9  5 - 15  MAGNESIUM     Status: None   Collection Time    11/26/13  4:40 AM      Result Value Ref Range   Magnesium 2.2  1.5 - 2.5 mg/dL   Assessment: 1. 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  Not LVAD candidate with severe RV failure and gangrene.      --EF approximately 35%, RV severely dilated. Difficult study.  2. Anasarca 3. CAD s/p out-of -hospital LAD infarct  5/15, patient also had likely RV infarction.      --LAD and RCA 100% occluded. Lcx 30% EF 20% -> medical management 4. 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 PAD, severe.  Dry gangrene right foot.  6. Morbid obesity 7. Acute on chronic respiratory failure 8. Severe decubiti 9. C difficile colitis: He has completed treatment.   10. Acute kidney injury, improved 11. Protein-calorie malnutrition 12. RUE Edema.  13. Hypomagnesemia 14. Hypotension  15. UTI: ceftriaxone  Plan/discussion:  BP better today. CVP still slightly elevated but given RV failure he is likely preload dependent and will not be too aggressive.  Think he can restart today on po Lasix but will continue to hold off on ACEI.  He is now on ceftriaxone for UTI and is afebrile.  He will need treatment by vascular surgery for right foot gangrene.   Nickalaus Crooke,MD 7:50 AM 11/26/2013

## 2013-11-27 DIAGNOSIS — Z66 Do not resuscitate: Secondary | ICD-10-CM

## 2013-11-27 LAB — CBC
HCT: 45.6 % (ref 39.0–52.0)
HEMOGLOBIN: 14.1 g/dL (ref 13.0–17.0)
MCH: 28.1 pg (ref 26.0–34.0)
MCHC: 30.9 g/dL (ref 30.0–36.0)
MCV: 90.8 fL (ref 78.0–100.0)
Platelets: 505 10*3/uL — ABNORMAL HIGH (ref 150–400)
RBC: 5.02 MIL/uL (ref 4.22–5.81)
RDW: 17.9 % — ABNORMAL HIGH (ref 11.5–15.5)
WBC: 16.5 10*3/uL — ABNORMAL HIGH (ref 4.0–10.5)

## 2013-11-27 LAB — BASIC METABOLIC PANEL
ANION GAP: 9 (ref 5–15)
BUN: 18 mg/dL (ref 6–23)
CHLORIDE: 96 meq/L (ref 96–112)
CO2: 34 meq/L — AB (ref 19–32)
Calcium: 9 mg/dL (ref 8.4–10.5)
Creatinine, Ser: 0.87 mg/dL (ref 0.50–1.35)
GFR calc Af Amer: >90 mL/min (ref >90)
GFR calc non Af Amer: 89 mL/min — ABNORMAL LOW (ref >90)
Glucose, Bld: 82 mg/dL (ref 70–99)
Potassium: 4.4 mEq/L (ref 3.7–5.3)
Sodium: 139 mEq/L (ref 137–147)

## 2013-11-27 LAB — MAGNESIUM: Magnesium: 2 mg/dL (ref 1.5–2.5)

## 2013-11-27 MED ORDER — FUROSEMIDE 40 MG PO TABS
60.0000 mg | ORAL_TABLET | Freq: Two times a day (BID) | ORAL | Status: DC
  Administered 2013-11-27 – 2013-12-04 (×14): 60 mg via ORAL
  Filled 2013-11-27 (×18): qty 1

## 2013-11-27 MED ORDER — LISINOPRIL 2.5 MG PO TABS
2.5000 mg | ORAL_TABLET | Freq: Every evening | ORAL | Status: DC
  Administered 2013-11-27 – 2013-12-03 (×6): 2.5 mg via ORAL
  Filled 2013-11-27 (×9): qty 1

## 2013-11-27 NOTE — Progress Notes (Signed)
Palliative Medicine Team Progress Note  Mr. Pellicane is gradually improving- but significantly improved overall since my original consultation on 9/11. He has diuresed over 50 lbs of fluid- he feels dramatically better- feels like he could be more mobile now- his renal function has remained normal. Suspect he is reaching a more euvolemic state. BP has limited additional diuresis.  He reports being very appreciative of the discussion we had last week about palliative care and advance care planning- he talked more about it with his son and daughter and also with the chaplain who helped complete the documents. I asked him about his CODE STATUS and he told me that he wanted to make the decision to be a DNR- he never wants to be on a ventilator- he says his children understand and are supportive. He does however desire full scope medical treatment, hospitalization and procedures if indicated to improve as much as possible.  He reports his pain is much better, along with his mood, but his anxious to get disconnected and move off the stepdown unit so he can move around more.  Recommendations:  1. DNR, [place] placed on chart 2. Transfer to Med-Surg- his tele has been stable 3. Maximize his Heart Failure Regimen 4. He is asking for more aggressive PT- he wants to try to walk more. 5. Continue pain PRNs  Disposition: Will need SNF rehab- would like palliative to follow there- he will need close attention to his heart failure at discharge- and specific instructions for skilled care if his weight starts to increase. Will need careful chronic disease management and palliation of symptoms.  Time: 6PM-6:39PM 35 minutes Greater than 50%  of this time was spent counseling and coordinating care related to the above assessment and plan.  Lane Hacker, DO Palliative Medicine [phone]

## 2013-11-27 NOTE — Progress Notes (Signed)
Placed pt on CPAP. Pt tolerating well at this time.  

## 2013-11-27 NOTE — Progress Notes (Signed)
11/27/2013 patient refuse influenza vaccine that was due today at 1000.  Per patient he was give the vaccine at his Primary physician this year. [facility] RN.

## 2013-11-27 NOTE — Progress Notes (Signed)
Patient ID: Fernando Lane, male   DOB: [birth year], 63 y.o.   MRN: [id number] ADVANCED HEART FAILURE ROUNDING NOTE DAILY PROGRESS NOTE  Subjective:  Lasix/metolazone  stopped and lisinopril held due to SBP in 80s. SBP in 100s now.  Weight down 1 lb this morning.    Denies SOB/CP. CVP 10-12.  Up in chair yesterday.  On ceftriaxone for UTI.   Advanced Directives conversation initiated 9/09 by Dr Alva Garnet. Palliative Care has seen. Remains Full Code.   Objective:  Temp:  [97.8 F (36.6 C)-98.4 F (36.9 C)] 98 F (36.7 C) (09/15 0400) Pulse Rate:  [91-100] 94 (09/15 0400) Resp:  [13-18] 17 (09/15 0400) BP: (91-111)/(55-83) 100/72 mmHg (09/15 0400) SpO2:  [88 %-100 %] 100 % (09/15 0400) Weight:  [200 lb (90.719 kg)] 200 lb (90.719 kg) (09/15 0500) Weight change: -1 lb 1 oz (-0.481 kg)  Intake/Output from previous day: 09/14 0701 - 09/15 0700 In: 360 [P.O.:360] Out: 400 [Urine:400]  Intake/Output from this shift:    Medications: Current Facility-Administered Medications  Medication Dose Route Frequency Provider Last Rate Last Dose  . 0.9 %  sodium chloride infusion  250 mL Intravenous PRN Corey Harold, NP 10 mL/hr at 11/18/13 1000 250 mL at 11/18/13 1000  . albuterol (PROVENTIL) (2.5 MG/3ML) 0.083% nebulizer solution 2.5 mg  2.5 mg Nebulization Q2H PRN Cherene Altes, MD      . aspirin tablet 325 mg  325 mg Oral Daily Doree Fudge, MD   325 mg at 11/26/13 1242  . atorvastatin (LIPITOR) tablet 80 mg  80 mg Oral q1800 Doree Fudge, MD   80 mg at 11/26/13 1742  . bacitracin ointment   Topical BID Allie Bossier, MD   1 application at 31/54/00 2155  . bisacodyl (DULCOLAX) suppository 10 mg  10 mg Rectal Daily PRN Acquanetta Chain, DO      . cefTRIAXone (ROCEPHIN) 1 g in dextrose 5 % 50 mL IVPB  1 g Intravenous Q24H Delfina Redwood, MD   1 g at 11/26/13 1743  . docusate sodium (COLACE) capsule 100 mg  100 mg Oral Daily PRN Melvenia Needles, NP   100 mg  at 11/18/13 0545  . enoxaparin (LOVENOX) injection 55 mg  55 mg Subcutaneous Daily Jolaine Artist, MD   55 mg at 11/26/13 1243  . feeding supplement (ENSURE COMPLETE) (ENSURE COMPLETE) liquid 237 mL  237 mL Oral BID BM Rogue Bussing, RD   237 mL at 11/25/13 1431  . fentaNYL (SUBLIMAZE) injection 25-50 mcg  25-50 mcg Intravenous Q2H PRN Wilhelmina Mcardle, MD   50 mcg at 11/22/13 1308  . furosemide (LASIX) tablet 60 mg  60 mg Oral BID Larey Dresser, MD      . Gerhardt's butt cream   Topical BID Acquanetta Chain, DO   1 application at 86/76/19 2155  . Influenza vac split quadrivalent PF (FLUARIX) injection 0.5 mL  0.5 mL Intramuscular Tomorrow-1000 Cherene Altes, MD      . lisinopril (PRINIVIL,ZESTRIL) tablet 2.5 mg  2.5 mg Oral QPM Larey Dresser, MD      . oxyCODONE (Oxy IR/ROXICODONE) immediate release tablet 5-10 mg  5-10 mg Oral Q4H PRN Samella Parr, NP   10 mg at 11/26/13 2155  . senna-docusate (Senokot-S) tablet 1 tablet  1 tablet Oral QHS Acquanetta Chain, DO   1 tablet at 11/26/13 2155  . spironolactone (ALDACTONE) tablet 25 mg  25 mg Oral Daily Quillian Quince  R Bensimhon, MD   25 mg at 11/26/13 1242    Physical Exam: CVP 10-12 General:  Chronically ill-appearing. No resp difficulty HEENT: normal Neck: supple. JVP ?8-9 cm. Carotids 2+ bilat; no bruits. RIJ  TLC Cor: RRR distant Lungs: clear anteriorly Abdomen: markedly obese mildly distended.  Good bowel sounds. Extremities: no cyanosis, clubbing, rash.  1+ edema 1/2 up lower legs bilaterally. +LE erythema. decubitus ulcers covered. Gangrenous toes R foot   Neuro: alert & orientedx3, cranial nerves grossly intact. moves all 4 extremities w/o difficulty. Affect pleasant   Lab Results: Results for orders placed during the hospital encounter of 11/15/13 (from the past 48 hour(s))  URINALYSIS, ROUTINE W REFLEX MICROSCOPIC     Status: Abnormal   Collection Time    11/25/13 10:20 AM      Result Value Ref Range   Color,  Urine AMBER (*) YELLOW   Comment: BIOCHEMICALS MAY BE AFFECTED BY COLOR   APPearance CLOUDY (*) CLEAR   Specific Gravity, Urine 1.013  1.005 - 1.030   pH 8.5 (*) 5.0 - 8.0   Glucose, UA NEGATIVE  NEGATIVE mg/dL   Hgb urine dipstick LARGE (*) NEGATIVE   Bilirubin Urine NEGATIVE  NEGATIVE   Ketones, ur NEGATIVE  NEGATIVE mg/dL   Protein, ur 30 (*) NEGATIVE mg/dL   Urobilinogen, UA 0.2  0.0 - 1.0 mg/dL   Nitrite NEGATIVE  NEGATIVE   Leukocytes, UA LARGE (*) NEGATIVE  CBC WITH DIFFERENTIAL     Status: Abnormal   Collection Time    11/25/13 10:20 AM      Result Value Ref Range   WBC 18.0 (*) 4.0 - 10.5 K/uL   RBC 5.33  4.22 - 5.81 MIL/uL   Hemoglobin 15.1  13.0 - 17.0 g/dL   HCT 48.5  39.0 - 52.0 %   MCV 91.0  78.0 - 100.0 fL   MCH 28.3  26.0 - 34.0 pg   MCHC 31.1  30.0 - 36.0 g/dL   RDW 17.8 (*) 11.5 - 15.5 %   Platelets 405 (*) 150 - 400 K/uL   Neutrophils Relative % 90 (*) 43 - 77 %   Neutro Abs 16.1 (*) 1.7 - 7.7 K/uL   Lymphocytes Relative 6 (*) 12 - 46 %   Lymphs Abs 1.0  0.7 - 4.0 K/uL   Monocytes Relative 2 (*) 3 - 12 %   Monocytes Absolute 0.4  0.1 - 1.0 K/uL   Eosinophils Relative 2  0 - 5 %   Eosinophils Absolute 0.4  0.0 - 0.7 K/uL   Basophils Relative 0  0 - 1 %   Basophils Absolute 0.0  0.0 - 0.1 K/uL  URINE MICROSCOPIC-ADD ON     Status: None   Collection Time    11/25/13 10:20 AM      Result Value Ref Range   Squamous Epithelial / LPF RARE  RARE   WBC, UA TOO NUMEROUS TO COUNT  <3 WBC/hpf   RBC / HPF 21-50  <3 RBC/hpf   Bacteria, UA RARE  RARE   Urine-Other FEW YEAST    URINE CULTURE     Status: None   Collection Time    11/25/13  4:30 PM      Result Value Ref Range   Specimen Description URINE, CATHETERIZED     Special Requests NONE     Culture  Setup Time       Value: 11/25/2013 19:29     Performed at [facility]  [place] Count PENDING     Culture       Value: Culture reincubated for better growth     Performed at [facility]    Report Status PENDING    CARBOXYHEMOGLOBIN     Status: Abnormal   Collection Time    11/26/13  4:30 AM      Result Value Ref Range   Total hemoglobin 14.5  13.5 - 18.0 g/dL   O2 Saturation 83.2     Carboxyhemoglobin 2.2 (*) 0.5 - 1.5 %   Methemoglobin 0.7  0.0 - 1.5 %  BASIC METABOLIC PANEL     Status: Abnormal   Collection Time    11/26/13  4:40 AM      Result Value Ref Range   Sodium 138  137 - 147 mEq/L   Potassium 5.0  3.7 - 5.3 mEq/L   Comment: HEMOLYSIS AT THIS LEVEL MAY AFFECT RESULT   Chloride 90 (*) 96 - 112 mEq/L   CO2 39 (*) 19 - 32 mEq/L   Glucose, Bld 81  70 - 99 mg/dL   BUN 21  6 - 23 mg/dL   Creatinine, Ser 0.87  0.50 - 1.35 mg/dL   Calcium 9.0  8.4 - 10.5 mg/dL   GFR calc non Af Amer 89 (*) >90 mL/min   GFR calc Af Amer >90  >90 mL/min   Comment: (NOTE)     The eGFR has been calculated using the CKD EPI equation.     This calculation has not been validated in all clinical situations.     eGFR's persistently <90 mL/min signify possible Chronic Kidney     Disease.   Anion gap 9  5 - 15  MAGNESIUM     Status: None   Collection Time    11/26/13  4:40 AM      Result Value Ref Range   Magnesium 2.2  1.5 - 2.5 mg/dL  BASIC METABOLIC PANEL     Status: Abnormal   Collection Time    11/27/13  5:30 AM      Result Value Ref Range   Sodium 139  137 - 147 mEq/L   Potassium 4.4  3.7 - 5.3 mEq/L   Chloride 96  96 - 112 mEq/L   CO2 34 (*) 19 - 32 mEq/L   Glucose, Bld 82  70 - 99 mg/dL   BUN 18  6 - 23 mg/dL   Creatinine, Ser 0.87  0.50 - 1.35 mg/dL   Calcium 9.0  8.4 - 10.5 mg/dL   GFR calc non Af Amer 89 (*) >90 mL/min   GFR calc Af Amer >90  >90 mL/min   Comment: (NOTE)     The eGFR has been calculated using the CKD EPI equation.     This calculation has not been validated in all clinical situations.     eGFR's persistently <90 mL/min signify possible Chronic Kidney     Disease.   Anion gap 9  5 - 15  MAGNESIUM     Status: None   Collection Time    11/27/13   5:30 AM      Result Value Ref Range   Magnesium 2.0  1.5 - 2.5 mg/dL  CBC     Status: Abnormal   Collection Time    11/27/13  5:30 AM      Result Value Ref Range   WBC 16.5 (*) 4.0 - 10.5 K/uL   RBC 5.02  4.22 - 5.81 MIL/uL   Hemoglobin  14.1  13.0 - 17.0 g/dL   HCT 45.6  39.0 - 52.0 %   MCV 90.8  78.0 - 100.0 fL   MCH 28.1  26.0 - 34.0 pg   MCHC 30.9  30.0 - 36.0 g/dL   RDW 17.9 (*) 11.5 - 15.5 %   Platelets 505 (*) 150 - 400 K/uL   Assessment: 1. 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  Not LVAD candidate with severe RV failure and gangrene.      --EF approximately 35%, RV severely dilated. Difficult study.  2. Anasarca 3. CAD s/p out-of -hospital LAD infarct 5/15, patient also had likely RV infarction.      --LAD and RCA 100% occluded. Lcx 30% EF 20% -> medical management 4. 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 PAD, severe.  Dry gangrene right foot.  6. Morbid obesity 7. Acute on chronic respiratory failure 8. Severe decubiti 9. C difficile colitis: He has completed treatment.   10. Acute kidney injury, improved 11. Protein-calorie malnutrition 12. RUE Edema.  13. Hypomagnesemia 14. Hypotension 15. UTI: ceftriaxone  Plan/discussion:  BP stable. CVP still elevated but given RV failure he is likely preload dependent and will not be too aggressive.  Increase Lasix to 60 mg po bid.  Will try to add back low dose lisinopril in the evening (2.5 mg).    He is now on ceftriaxone for UTI and is afebrile.  Agree with keeping course short as possible given recent C diff.   He will need treatment by vascular surgery for right foot gangrene once other issues are resolved.  Needs PT.    Shawan Tosh,MD 7:45 AM 11/27/2013

## 2013-11-27 NOTE — Progress Notes (Signed)
Placed pt on CPAP of 10 cmH2O. Pt tolerating well at this time.

## 2013-11-27 NOTE — Progress Notes (Signed)
[place] TEAM 1 - Stepdown/ICU TEAM Progress Note  Fernando Lane QMG:[id number] DOB: July 01, 1950 DOA: 11/15/2013 PCP: Gwendolyn Grant, MD  Admit HPI / Brief Narrative: 63 year old M SNF resident w/ Hx polycythemia vera, OSA on nocturnal CPAP, CHF, and CAD (2 vessel disease with medical management only) who developed SOB 9/3 am. He was transported to [facility] ED with septic shock, was intubated, and placed on pressors. Transferred to [facility] ICU 9/03, per PCCM. Diagnosed with C diff prior to admission.  Transferred to [facility] 9/10.  Significant Events: 9/3 Intubated in [place] ED, transferred to [facility]  9/4 Extubated. Trop I elevated @ 2.63  9/04 WOC consult: Entire buttocks and posterior scrotum with erythremia and patchy areas of skin loss. Right foot is purple and mottled, some toes with dry intact eschar.   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  -doubt this is the source of his sepsis. Plavix discontinued in anticipation for possible surgery  9/4 TTE: Limited study, akinetic apex with unusual appearance, cannot rule out distal muscular VSD, mildly dilated RA, severely dilated RV, moderately dilated RA moderate TR, no RVSP estimate given  9/05 Cards Consult: consider cardiac MRI to further elucidate the abnormal LV anatomy and clarify cause of RV dilation  9/07 Weaning off vasopressors  9/08 Off vasopressors. Diuresis ordered for severe hypervolemia. Discussed with Dr Debara Pickett re: Echo findings of severe RV dilation. Consider RHC when more stabilized.  9/08 Urology consult: Rec leaving Foley cath until penile and scrotal edema resolve  9/08 PM: Furosemide gtt initiated by Cardiology  9/09 Advanced Directives conversation initiated 9/09 by Dr Alva Garnet. Advised no CPR/ACLS and DNI or short term intubation only. Palliative Care consultation requested. Transfer to SDU ordered.  9/10 TRH assumed care   HPI/Subjective: Pt is in good spirits.  He  is motivated to get out of bed and ambulate.  He is anxious to get back to a rehab facility.    Assessment/Plan:  Septic shock Completed abx for C diff.  BP appears to be stabilizing/shock resolved.   Sepsis physiology esentially resovled.  Palliative Care has met w/ pt but he remains full code/full care at this time.   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 until edema improves"  Dry gangrene of fourth right toe - Severe PAD Vascular Surgery plan is for amputation when patient stable - all large vessels were patent on recent arteriogram  Asymptomatyic hypotension see above - improving   Acute on chronic biventricular heart failure Cardiology attending to diuresis/fluid balance - not LVAD candidate with severe RV failure and gangrene - EF approximately 35%, RV severely dilated  CAD s/p out-of-hospital LAD infarct 5/15, patient also had likely RV infarction - LAD and RCA 100% occluded - Lcx 30% EF 20% -> medical management   Acute respiratory failure resolved   OSA  Continue CPAP nightly per home routine   Polycythemia vera  Following Hgb - stable   Chronic pain / severe debilitation continue oxycodone IR - Palitiative Care following - long term prognosis guarded  Obesity - Body mass index is 32.3 kg/(m^2).  MRSA screen +  Code Status: FULL Family Communication: no family present  at time of exam today  Disposition Plan: continue SDU - eventual SNF - begin PT/OT   Consultants: Cardiology / heart failure Vascular Surgery Palliative Care   Antibiotics: Vanc 9/03 >> 9/07  Pip-tazo 9/03 >> 9/07  Fidaxomicin 8/31 >> 9/09 Ceftriaxone 9/13 >  DVT prophylaxis: Lovenox  LINES:  R IJ CVL  9/03 >>   Objective: Blood pressure 100/63, pulse 93, temperature 98.3 F (36.8 C), temperature source Oral, resp. rate 15, height '5\' 6"'  (1.676 m), weight 90.719 kg (200 lb), SpO2 95.00%.  Intake/Output Summary (Last 24 hours) at 11/27/13 1151 Last data filed at 11/26/13 1300  Gross per 24 hour  Intake    120 ml  Output    150 ml  Net    -30 ml   Exam: General: no acute resp distress - alert and conversant  Lungs: clear to auscultation bilaterally without wheezes or crackles Cardiovascular: regular rate and rhythm without murmur gallop or rub Abdomen: Nontender, nondistended, soft, bowel sounds positive, no rebound GU: foley in place - no penile edema or erythema  Extremities: fourth right toe dry gangrene   Data Reviewed: Basic Metabolic Panel:  Recent Labs Lab 11/23/13 0500 11/24/13 0627 11/25/13 0500 11/26/13 0440 11/27/13 0530  NA 143 141 136* 138 139  K 4.1 4.2 4.7 5.0 4.4  CL 95* 91* 86* 90* 96  CO2 37* 44* 44* 39* 34*  GLUCOSE 81 86 85 81 82  BUN 25* '21 21 21 18  ' CREATININE 0.83 0.80 0.83 0.87 0.87  CALCIUM 9.0 9.3 9.3 9.0 9.0  MG 1.4* 1.5 2.2 2.2 2.0   Liver Function Tests: No results found for this basename: AST, ALT, ALKPHOS, BILITOT, PROT, ALBUMIN,  in the last 168 hours CBC:  Recent Labs Lab 11/21/13 0500 11/22/13 0440 11/25/13 1020 11/27/13 0530  WBC 14.2* 14.9* 18.0* 16.5*  NEUTROABS  --   --  16.1*  --   HGB 13.7 14.0 15.1 14.1  HCT 43.9 44.1 48.5 45.6  MCV 90.1 88.4 91.0 90.8  PLT 174 163 405* 505*   BNP (last 3 results)  Recent Labs  08/07/13 1600 08/23/13 1759 11/15/13 1951  PROBNP 13375.0* 9383.0* 9486.0*   CBG:  Recent Labs Lab 11/20/13 1229 11/20/13 1544 11/20/13 1950 11/24/13 1149  GLUCAP 91 97 83 88   Studies:  Recent x-ray studies have been reviewed in detail by the Attending Physician  Scheduled Meds:  Scheduled Meds: . aspirin  325 mg Oral Daily  . atorvastatin  80 mg Oral q1800  . bacitracin   Topical BID   . cefTRIAXone (ROCEPHIN)  IV  1 g Intravenous Q24H  . enoxaparin (LOVENOX) injection  55 mg Subcutaneous Daily  . feeding supplement (ENSURE COMPLETE)  237 mL Oral BID BM  . furosemide  60 mg Oral BID  . Gerhardt's butt cream   Topical BID  . Influenza vac split quadrivalent PF  0.5 mL Intramuscular Tomorrow-1000  . lisinopril  2.5 mg Oral QPM  . senna-docusate  1 tablet Oral QHS  . spironolactone  25 mg Oral Daily   Time spent on care of this patient: 35 mins  Cherene Altes, MD [facility] For Consults/Admissions - Flow Manager - [phone] Office  [phone] Pager [phone]  On-Call/Text Page:      [email or web address]      password [facility]  11/27/2013, 11:51 AM   LOS: 12 days

## 2013-11-28 LAB — BASIC METABOLIC PANEL
ANION GAP: 8 (ref 5–15)
BUN: 17 mg/dL (ref 6–23)
CALCIUM: 9 mg/dL (ref 8.4–10.5)
CO2: 34 mEq/L — ABNORMAL HIGH (ref 19–32)
Chloride: 94 mEq/L — ABNORMAL LOW (ref 96–112)
Creatinine, Ser: 0.84 mg/dL (ref 0.50–1.35)
GFR calc non Af Amer: >90 mL/min (ref >90)
Glucose, Bld: 78 mg/dL (ref 70–99)
Potassium: 4.3 mEq/L (ref 3.7–5.3)
SODIUM: 136 meq/L — AB (ref 137–147)

## 2013-11-28 LAB — MAGNESIUM: MAGNESIUM: 2 mg/dL (ref 1.5–2.5)

## 2013-11-28 LAB — CBC
HCT: 45.9 % (ref 39.0–52.0)
Hemoglobin: 14.2 g/dL (ref 13.0–17.0)
MCH: 28.3 pg (ref 26.0–34.0)
MCHC: 30.9 g/dL (ref 30.0–36.0)
MCV: 91.4 fL (ref 78.0–100.0)
PLATELETS: 562 10*3/uL — AB (ref 150–400)
RBC: 5.02 MIL/uL (ref 4.22–5.81)
RDW: 17.9 % — AB (ref 11.5–15.5)
WBC: 16.3 10*3/uL — ABNORMAL HIGH (ref 4.0–10.5)

## 2013-11-28 LAB — CARBOXYHEMOGLOBIN
CARBOXYHEMOGLOBIN: 2 % — AB (ref 0.5–1.5)
Methemoglobin: 0.6 % (ref 0.0–1.5)
O2 SAT: 93.2 %
Total hemoglobin: 14.7 g/dL (ref 13.5–18.0)

## 2013-11-28 MED ORDER — CARVEDILOL 3.125 MG PO TABS
3.1250 mg | ORAL_TABLET | Freq: Two times a day (BID) | ORAL | Status: DC
  Administered 2013-11-28 – 2013-12-04 (×11): 3.125 mg via ORAL
  Filled 2013-11-28 (×16): qty 1

## 2013-11-28 MED ORDER — ENOXAPARIN SODIUM 60 MG/0.6ML ~~LOC~~ SOLN
45.0000 mg | Freq: Every day | SUBCUTANEOUS | Status: DC
  Administered 2013-11-29 – 2013-12-04 (×5): 45 mg via SUBCUTANEOUS
  Filled 2013-11-28 (×6): qty 0.6

## 2013-11-28 NOTE — Progress Notes (Signed)
Patient ID: Fernando Lane, male   DOB: Jul 15, 1950, 63 y.o.   MRN: [id number] ADVANCED HEART FAILURE ROUNDING NOTE DAILY PROGRESS NOTE  Subjective:  Stable this morning.  Denies SOB/CP. CVP 9.  Up in chair yesterday.  On ceftriaxone for UTI. Has been getting out of bed to chair.   Patient now DNR.   Objective:  Temp:  [98 F (36.7 C)-98.5 F (36.9 C)] 98.2 F (36.8 C) (09/16 0422) Pulse Rate:  [93-98] 97 (09/15 2249) Resp:  [15-22] 18 (09/15 2249) BP: (87-116)/(63-100) 92/66 mmHg (09/15 2008) SpO2:  [95 %-100 %] 95 % (09/15 2249) Weight change:   Intake/Output from previous day: 09/15 0701 - 09/16 0700 In: 410 [P.O.:360; IV Piggyback:50] Out: 2075 [Urine:2075]  Intake/Output from this shift:    Scheduled Meds: . aspirin  325 mg Oral Daily  . atorvastatin  80 mg Oral q1800  . bacitracin   Topical BID  . carvedilol  3.125 mg Oral BID WC  . cefTRIAXone (ROCEPHIN)  IV  1 g Intravenous Q24H  . enoxaparin (LOVENOX) injection  55 mg Subcutaneous Daily  . feeding supplement (ENSURE COMPLETE)  237 mL Oral BID BM  . furosemide  60 mg Oral BID  . Gerhardt's butt cream   Topical BID  . Influenza vac split quadrivalent PF  0.5 mL Intramuscular Tomorrow-1000  . lisinopril  2.5 mg Oral QPM  . senna-docusate  1 tablet Oral QHS  . spironolactone  25 mg Oral Daily   Continuous Infusions:  PRN Meds:.sodium chloride, albuterol, bisacodyl, docusate sodium, fentaNYL, oxyCODONE  Physical Exam: CVP 10-12 General:  Chronically ill-appearing. No resp difficulty HEENT: normal Neck: supple. JVP ?8 cm. Carotids 2+ bilat; no bruits. RIJ CVL Cor: RRR distant Lungs: clear anteriorly Abdomen: markedly obese mildly distended.  Good bowel sounds. Extremities: no cyanosis, clubbing, rash.  1+ edema 1/2 up lower legs bilaterally. +LE erythema. decubitus ulcers covered. Gangrenous toes R foot   Neuro: alert & orientedx3, cranial nerves grossly intact. moves all 4 extremities w/o difficulty. Affect  pleasant   Lab Results: Results for orders placed during the hospital encounter of 11/15/13 (from the past 48 hour(s))  BASIC METABOLIC PANEL     Status: Abnormal   Collection Time    11/27/13  5:30 AM      Result Value Ref Range   Sodium 139  137 - 147 mEq/L   Potassium 4.4  3.7 - 5.3 mEq/L   Chloride 96  96 - 112 mEq/L   CO2 34 (*) 19 - 32 mEq/L   Glucose, Bld 82  70 - 99 mg/dL   BUN 18  6 - 23 mg/dL   Creatinine, Ser 0.87  0.50 - 1.35 mg/dL   Calcium 9.0  8.4 - 10.5 mg/dL   GFR calc non Af Amer 89 (*) >90 mL/min   GFR calc Af Amer >90  >90 mL/min   Comment: (NOTE)     The eGFR has been calculated using the CKD EPI equation.     This calculation has not been validated in all clinical situations.     eGFR's persistently <90 mL/min signify possible Chronic Kidney     Disease.   Anion gap 9  5 - 15  MAGNESIUM     Status: None   Collection Time    11/27/13  5:30 AM      Result Value Ref Range   Magnesium 2.0  1.5 - 2.5 mg/dL  CBC     Status: Abnormal   Collection Time  11/27/13  5:30 AM      Result Value Ref Range   WBC 16.5 (*) 4.0 - 10.5 K/uL   RBC 5.02  4.22 - 5.81 MIL/uL   Hemoglobin 14.1  13.0 - 17.0 g/dL   HCT 45.6  39.0 - 52.0 %   MCV 90.8  78.0 - 100.0 fL   MCH 28.1  26.0 - 34.0 pg   MCHC 30.9  30.0 - 36.0 g/dL   RDW 17.9 (*) 11.5 - 15.5 %   Platelets 505 (*) 150 - 400 K/uL  MAGNESIUM     Status: None   Collection Time    11/28/13  5:00 AM      Result Value Ref Range   Magnesium 2.0  1.5 - 2.5 mg/dL  BASIC METABOLIC PANEL     Status: Abnormal   Collection Time    11/28/13  5:00 AM      Result Value Ref Range   Sodium 136 (*) 137 - 147 mEq/L   Potassium 4.3  3.7 - 5.3 mEq/L   Chloride 94 (*) 96 - 112 mEq/L   CO2 34 (*) 19 - 32 mEq/L   Glucose, Bld 78  70 - 99 mg/dL   BUN 17  6 - 23 mg/dL   Creatinine, Ser 0.84  0.50 - 1.35 mg/dL   Calcium 9.0  8.4 - 10.5 mg/dL   GFR calc non Af Amer >90  >90 mL/min   GFR calc Af Amer >90  >90 mL/min   Comment:  (NOTE)     The eGFR has been calculated using the CKD EPI equation.     This calculation has not been validated in all clinical situations.     eGFR's persistently <90 mL/min signify possible Chronic Kidney     Disease.   Anion gap 8  5 - 15  CBC     Status: Abnormal   Collection Time    11/28/13  5:00 AM      Result Value Ref Range   WBC 16.3 (*) 4.0 - 10.5 K/uL   RBC 5.02  4.22 - 5.81 MIL/uL   Hemoglobin 14.2  13.0 - 17.0 g/dL   HCT 45.9  39.0 - 52.0 %   MCV 91.4  78.0 - 100.0 fL   MCH 28.3  26.0 - 34.0 pg   MCHC 30.9  30.0 - 36.0 g/dL   RDW 17.9 (*) 11.5 - 15.5 %   Platelets 562 (*) 150 - 400 K/uL  CARBOXYHEMOGLOBIN     Status: Abnormal   Collection Time    11/28/13  6:35 AM      Result Value Ref Range   Total hemoglobin 14.7  13.5 - 18.0 g/dL   O2 Saturation 93.2     Carboxyhemoglobin 2.0 (*) 0.5 - 1.5 %   Methemoglobin 0.6  0.0 - 1.5 %   Assessment: 1. 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  Not LVAD candidate with severe RV failure and gangrene.      --EF approximately 35%, RV severely dilated. Difficult study.  2. Anasarca 3. CAD s/p out-of -hospital LAD infarct 5/15, patient also had likely RV infarction.      --LAD and RCA 100% occluded. Lcx 30% EF 20% -> medical management 4. 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 PAD, severe.  Dry gangrene right foot.  6. Morbid obesity 7. Acute on chronic respiratory failure 8. Severe decubiti 9. C difficile colitis: He has completed treatment.   10.  Acute kidney injury, improved 11. Protein-calorie malnutrition 12. RUE Edema.  13. Hypomagnesemia 14. Hypotension 15. UTI: ceftriaxone  Plan/discussion:  BP stable. CVP still mildly elevated but given RV failure he is likely preload dependent and will not be too aggressive.  Continue current Lasix.  Tolerated low dose lisinopril, will add back low dose Coreg.   He is now on ceftriaxone for UTI and is  afebrile.  Agree with keeping course short as possible given recent C diff.   He will need treatment by vascular surgery for right foot gangrene once other issues are resolved.  Needs PT, aim for rehab soon.     Karlina Suares,MD 7:55 AM 11/28/2013

## 2013-11-28 NOTE — Progress Notes (Signed)
11/28/2013 patient foley cath was removed at 1620, because patient c/o burning at the tip of penis. Md was notified and order was given to removed to discontinue foley.  He had 250 of amber urine. [facility] RN.

## 2013-11-28 NOTE — Evaluation (Addendum)
Occupational Therapy Evaluation Patient Details Name: Fernando Lane MRN: [id number] DOB: 1950-04-03 Today's Date: 11/28/2013    History of Present Illness 63 yo male admitted from SNF in [place] after recent hospital admission for ischemic Rt LE and Nstemi. pt s/p fall at snf with new spleen contusion and Rib fx. Pt now readmitted with HTN, sepsis due to pyelonephritis, and metabolic encephalopathy.   Clinical Impression   Pt admitted with above. Feel pt will benefit from acute OT to increase independence and safety prior to d/c. Recommending SNF for further rehab.     Follow Up Recommendations  SNF    Equipment Recommendations  Other (comment) (defer to next venue)    Recommendations for Other Services       Precautions / Restrictions Precautions Precautions: Fall Precaution Comments: reports his Lt knee gives out and he fell Restrictions Weight Bearing Restrictions: No      Mobility Bed Mobility Overal bed mobility: Needs Assistance;+2 for physical assistance Bed Mobility: Supine to Sit     Supine to sit: Min assist        Transfers Overall transfer level: Needs assistance Equipment used: Rolling walker (2 wheeled) Transfers: Sit to/from [facility] Sit to Stand: +2 physical assistance;Mod assist Stand pivot transfers: +2 physical assistance;Mod assist       General transfer comment: cues for technique.    Balance Overall balance assessment: Needs assistance Sitting-balance support: Feet supported Sitting balance-Leahy Scale: Fair                                      ADL Overall ADL's : Needs assistance/impaired Eating/Feeding: Independent;Sitting   Grooming: Wash/dry face;Set up;Sitting   Upper Body Bathing: Set up;Sitting;Supervision/ safety   Lower Body Bathing: Maximal assistance;Sit to/from stand;+2 for physical assistance   Upper Body Dressing : Set up;Supervision/safety;Sitting   Lower Body Dressing: +2  for physical assistance;Maximal assistance   Toilet Transfer: +2 for physical assistance;Moderate assistance;Stand-pivot;RW (from bed to chair)   Toileting- Clothing Manipulation and Hygiene: +2 for physical assistance;Sit to/from stand;Maximal assistance       Functional mobility during ADLs: +2 for physical assistance;Moderate assistance;Rolling walker (stand pivot) General ADL Comments: Pt appeared to be trying to get out of bed when OT arrived. Assisted with transferring pt to chair. Pt performed a few leg exercises in chair and washed his face. Pt unable to reach feet. Briefly mentioned AE.     Vision                     Perception     Praxis      Pertinent Vitals/Pain Pain Assessment: No/denies pain; vitals stable     Hand Dominance     Extremity/Trunk Assessment Upper Extremity Assessment Upper Extremity Assessment: Generalized weakness RUE: Right arm with less AROM compared to left   Lower Extremity Assessment Lower Extremity Assessment: Defer to PT evaluation       Communication     Cognition Arousal/Alertness: Awake/alert Behavior During Therapy: WFL for tasks assessed/performed Overall Cognitive Status: Impaired/Different from baseline (unsure of baseline) Area of Impairment: Safety/judgement         Safety/Judgement: Decreased awareness of safety         General Comments       Exercises       Shoulder Instructions      Home Living Family/patient expects to be discharged to:: Skilled nursing facility  Prior Functioning/Environment Level of Independence: Needs assistance             OT Diagnosis: Acute pain;Generalized weakness   OT Problem List: Decreased strength;Decreased range of motion;Decreased knowledge of precautions;Decreased knowledge of use of DME or AE;Decreased safety awareness;Decreased activity tolerance   OT Treatment/Interventions: Self-care/ADL  training;Therapeutic exercise;DME and/or AE instruction;Patient/family education;Balance training;Therapeutic activities    OT Goals(Current goals can be found in the care plan section) Acute Rehab OT Goals Patient Stated Goal: not stated OT Goal Formulation: With patient Time For Goal Achievement: 12/12/13 Potential to Achieve Goals: Good ADL Goals Pt Will Perform Grooming: with min assist;standing Pt Will Perform Lower Body Bathing: with adaptive equipment;sit to/from stand;with mod assist Pt Will Perform Lower Body Dressing: with mod assist;with adaptive equipment;sit to/from stand Pt Will Transfer to Toilet: with min assist;stand pivot transfer;bedside commode Pt Will Perform Toileting - Clothing Manipulation and hygiene: sit to/from stand;with min assist  OT Frequency: Min 2X/week   Barriers to D/C:            Co-evaluation              End of Session Equipment Utilized During Treatment: Gait belt;Rolling walker  Activity Tolerance: Patient tolerated treatment well Patient left: in chair;with call bell/phone within reach;with chair alarm set;with family/visitor present   Time: 4627-0350 OT Time Calculation (min): 17 min Charges:  OT General Charges $OT Visit: 1 Procedure OT Evaluation $Initial OT Evaluation Tier I: 1 Procedure G-CodesBenito Mccreedy OTR/L [id number] 11/28/2013, 6:24 PM

## 2013-11-28 NOTE — Progress Notes (Signed)
Physical Therapy Treatment Patient Details Name: Fernando Lane MRN: [id number] DOB: 10-02-1950 Today's Date: 11/28/2013    History of Present Illness 63 yo male admitted from SNF in [place] after recent hospital admission for ischemic Rt LE and Nstemi. pt s/p fall at snf with new spleen contusion and Rib fx. Pt now readmitted with HTN, sepsis due to pyelonephritis, and metabolic encephalopathy.    PT Comments    Pt highly motivated. He is also very fearful of ambulation due to h/o Lt knee buckling and fall. Somewhat impulsive when going to return to sit (also due to fear). Pt in agreement with plan.  Follow Up Recommendations  SNF;Supervision/Assistance - 24 hour     Equipment Recommendations  None recommended by PT    Recommendations for Other Services       Precautions / Restrictions Precautions Precautions: Fall Precaution Comments: reports his Lt knee gives out and he fell Restrictions Weight Bearing Restrictions: No    Mobility  Bed Mobility                  Transfers Overall transfer level: Needs assistance Equipment used: Rolling walker (2 wheeled) Transfers: Sit to/from Stand Sit to Stand: Mod assist;+2 physical assistance;Min assist (progressed to min with repetition)         General transfer comment: cues for UE use, sequencing, safe use of RW.  pt needs A to power up to standing and to control descent to sitting (pt at times beginning to sit without alerting PT).  repeated x 6  Ambulation/Gait Ambulation/Gait assistance: Min assist;+2 physical assistance;+2 safety/equipment Ambulation Distance (Feet): 2 Feet Assistive device: Rolling walker (2 wheeled) Gait Pattern/deviations: Step-to pattern;Decreased stride length;Trunk flexed (Lt knee flexed (h/o TKR))     General Gait Details: pt fearful of Lt knee "giving out" with close follow with chair; reports fell even when using RW;    Stairs            Wheelchair Mobility    Modified  [place] (Stroke Patients Only)       Balance           Standing balance support: Bilateral upper extremity supported Standing balance-Leahy Scale: Poor Standing balance comment: initially with posterior bias with improvement with vc & facilitation                    Cognition Arousal/Alertness: Awake/alert Behavior During Therapy: WFL for tasks assessed/performed Overall Cognitive Status: Within Functional Limits for tasks assessed                      Exercises General Exercises - Lower Extremity Ankle Circles/Pumps: AROM;10 reps;Both Long Arc Quad: AROM;Left;5 reps;Limitations Long [employer] Limitations: attempted to "limber up knee" due to stiffness; pt reports this has never helped his knee since TKR (has flexion contracture) Other Exercises Other Exercises: Educated on bridging when in bed    General Comments        Pertinent Vitals/Pain Pain Assessment: 0-10 Pain Score: 7  Pain Location: Lt knee greatest Pain Intervention(s): Limited activity within patient's tolerance;Monitored during session    Home Living                      Prior Function            PT Goals (current goals can now be found in the care plan section) Acute Rehab PT Goals Patient Stated Goal: To go to NH for therapy Progress towards PT  goals: Progressing toward goals    Frequency  Min 2X/week    PT Plan Current plan remains appropriate    Co-evaluation             End of Session Equipment Utilized During Treatment: Gait belt Activity Tolerance: Patient tolerated treatment well Patient left: with call bell/phone within reach;in chair;with chair alarm set     Time: 0945-1010 PT Time Calculation (min): 25 min  Charges:  $Therapeutic Activity: 23-37 mins                    G Codes:      Fernando Lane 2013/12/25, 10:59 AM Pager [phone]

## 2013-11-28 NOTE — Progress Notes (Signed)
11/28/2013 cvp at 1000 was 4. And at 1515 was 8. [facility] RN.

## 2013-11-28 NOTE — Progress Notes (Signed)
Pt placed on CPAP set at 10 CMH20 per home settings via [place].  Pt tolerating well at this time, RT to monitor and assess as needed.

## 2013-11-28 NOTE — Progress Notes (Addendum)
[place] TEAM 1 - Stepdown/ICU TEAM Progress Note  Fernando Lane ZJI:[id number] DOB: 10-09-1950 DOA: 11/15/2013 PCP: Gwendolyn Grant, MD  Admit HPI / Brief Narrative: 63 year old M SNF resident w/ Hx polycythemia vera, OSA on nocturnal CPAP, CHF, and CAD (2 vessel disease with medical management only) who developed SOB 9/3 am. He was transported to [facility] ED with septic shock, was intubated, and placed on pressors. Transferred to [facility] ICU 9/03, per PCCM. Diagnosed with C diff prior to admission.  Transferred to [facility] 9/10.  Significant Events: 9/3 Intubated in [place] ED, transferred to [facility]  9/4 Extubated. Trop I elevated @ 2.63  9/04 WOC consult: Entire buttocks and posterior scrotum with erythremia and patchy areas of skin loss. Right foot is purple and mottled, some toes with dry intact eschar.   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  -doubt this is the source of his sepsis. Plavix discontinued in anticipation for possible surgery  9/4 TTE: Limited study, akinetic apex with unusual appearance, cannot rule out distal muscular VSD, mildly dilated RA, severely dilated RV, moderately dilated RA moderate TR, no RVSP estimate given  9/05 Cards Consult: consider cardiac MRI to further elucidate the abnormal LV anatomy and clarify cause of RV dilation  9/07 Weaning off vasopressors  9/08 Off vasopressors. Diuresis ordered for severe hypervolemia. Discussed with Dr Debara Pickett re: Echo findings of severe RV dilation. Consider RHC when more stabilized.  9/08 Urology consult: Rec leaving Foley cath until penile and scrotal edema resolve  9/08 PM: Furosemide gtt initiated by Cardiology  9/09 Advanced Directives conversation initiated 9/09 by Dr Alva Garnet. Advised no CPR/ACLS and DNI or short term intubation only. Palliative Care consultation requested. Transfer to SDU ordered.  9/10 TRH assumed care   HPI/Subjective: Pt has no new complaints  today.  He has decided to be NCB, but dose desire ongoing aggressive medical care o/w.  He is motivated to be d/c to SNF in [place] for rehab, with the ultimate goal to have his Vasc Surgery once more stable.    Assessment/Plan:  Septic shock Completed abx for C diff.  BP was stabilizing, but today has begun to dip in to the 17P systolic again, coincident w/ resumption of coreg.  Monitor in SDU for now.  Pt is tolerating well for now.    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 until edema improves" - no edema on exam - will attempt to remove foley and follow   Dry gangrene of fourth right toe - Severe PAD Vascular Surgery plan is for amputation when patient stable - all large vessels were patent on recent arteriogram  Asymptomatyic hypotension see above - recurring   Acute on chronic biventricular heart failure Cardiology attending to diuresis/fluid balance - not LVAD candidate with severe RV failure and gangrene - EF approximately 35%, RV severely dilated - agrree pt clearly preload depending - may not be able to tolerate both ACE and BB - follow    CAD s/p out-of-hospital LAD infarct 5/15, patient also had likely RV infarction - LAD and RCA 100% occluded - Lcx 30% EF 20% -> medical management  Acute respiratory failure resolved   OSA  Continue CPAP nightly per home routine   Polycythemia vera  Following Hgb - stable   Chronic pain / severe debilitation continue oxycodone IR - Palitiative Care following - long term prognosis guarded   Obesity - Body mass index is 32.3 kg/(m^2).  MRSA screen +  Code Status: NCB/DNR Family Communication: no  family present at time of exam today  Disposition Plan: continue SDU due to hypotension - cont PT/OT - D/C to SNF for rehab Thursday or Friday if BP stabilizes   Consultants: Cardiology / heart failure Vascular Surgery Palliative Care   Antibiotics: Vanc 9/03 >> 9/07  Pip-tazo 9/03 >> 9/07  Fidaxomicin 8/31 >> 9/09 Ceftriaxone 9/13 >  DVT prophylaxis: Lovenox  LINES:  R IJ CVL 9/03 >>   Objective: Blood pressure 88/59, pulse 92, temperature 97.5 F (36.4 C), temperature source Axillary, resp. rate 26, height 5\' 6"  (1.676 m), weight 90.719 kg (200 lb), SpO2 96.00%.  Intake/Output Summary (Last 24 hours) at 11/28/13 1438 Last data filed at 11/28/13 0900  Gross per 24 hour  Intake    240 ml  Output   1350 ml  Net  -1110 ml   Exam: General: no acute resp distress - alert and conversant - R neck CVL in place  Lungs: clear to auscultation bilaterally without wheezes or crackles Cardiovascular: regular rate and rhythm without murmur gallop or rub Abdomen: Nontender, nondistended, soft, bowel sounds positive, no rebound GU: foley in place - no penile edema or erythema  Extremities: fourth right toe dry gangrene w/o change in exam    Data Reviewed: Basic Metabolic Panel:  Recent Labs Lab 11/24/13 0627 11/25/13 0500 11/26/13 0440 11/27/13 0530 11/28/13 0500  NA 141 136* 138 139 136*  K 4.2 4.7 5.0 4.4 4.3  CL 91* 86* 90* 96 94*  CO2 44* 44* 39* 34* 34*  GLUCOSE 86 85 81 82 78  BUN 21 21 21 18 17   CREATININE 0.80 0.83 0.87 0.87 0.84  CALCIUM 9.3 9.3 9.0 9.0 9.0  MG 1.5 2.2 2.2 2.0 2.0   Liver Function Tests: No results found for this basename: AST, ALT, ALKPHOS, BILITOT, PROT, ALBUMIN,  in the last 168 hours  CBC:  Recent Labs Lab 11/22/13 0440 11/25/13 1020 11/27/13 0530 11/28/13 0500  WBC 14.9* 18.0* 16.5* 16.3*  NEUTROABS  --  16.1*  --   --   HGB 14.0 15.1 14.1 14.2  HCT 44.1 48.5 45.6 45.9  MCV 88.4 91.0 90.8 91.4  PLT 163 405* 505* 562*   BNP  (last 3 results)  Recent Labs  08/07/13 1600 08/23/13 1759 11/15/13 1951  PROBNP 13375.0* 9383.0* 9486.0*   CBG:  Recent Labs Lab 11/24/13 1149  GLUCAP 88   Studies:  Recent x-ray studies have been reviewed in detail by the Attending Physician  Scheduled Meds:  Scheduled Meds: . aspirin  325 mg Oral Daily  . atorvastatin  80 mg Oral q1800  . bacitracin   Topical BID  . carvedilol  3.125 mg Oral BID WC  . cefTRIAXone (ROCEPHIN)  IV  1 g Intravenous Q24H  . [START ON 11/29/2013] enoxaparin (LOVENOX) injection  45 mg Subcutaneous Daily  . feeding supplement (ENSURE COMPLETE)  237 mL Oral BID BM  . furosemide  60 mg Oral BID  . Gerhardt's butt cream   Topical BID  . Influenza vac split quadrivalent PF  0.5 mL Intramuscular Tomorrow-1000  . lisinopril  2.5 mg Oral QPM  .  senna-docusate  1 tablet Oral QHS  . spironolactone  25 mg Oral Daily   Time spent on care of this patient: 35 mins  Cherene Altes, MD [facility] For Consults/Admissions - Flow Manager - [phone] Office  [phone] Pager [phone]  On-Call/Text Page:      [email or web address]      password [facility]  11/28/2013, 2:38 PM   LOS: 13 days

## 2013-11-28 NOTE — Progress Notes (Addendum)
11/28/2013 Patient voided 100cc in urinal at 1715 and at [place] had 125cc. [facility] RN.

## 2013-11-29 DIAGNOSIS — I25810 Atherosclerosis of coronary artery bypass graft(s) without angina pectoris: Secondary | ICD-10-CM

## 2013-11-29 DIAGNOSIS — G894 Chronic pain syndrome: Secondary | ICD-10-CM

## 2013-11-29 LAB — BASIC METABOLIC PANEL
Anion gap: 14 (ref 5–15)
BUN: 19 mg/dL (ref 6–23)
CALCIUM: 8.9 mg/dL (ref 8.4–10.5)
CO2: 28 mEq/L (ref 19–32)
Chloride: 95 mEq/L — ABNORMAL LOW (ref 96–112)
Creatinine, Ser: 0.9 mg/dL (ref 0.50–1.35)
GFR calc Af Amer: >90 mL/min (ref >90)
GFR, EST NON AFRICAN AMERICAN: 88 mL/min — AB (ref >90)
GLUCOSE: 80 mg/dL (ref 70–99)
POTASSIUM: 4.5 meq/L (ref 3.7–5.3)
Sodium: 137 mEq/L (ref 137–147)

## 2013-11-29 LAB — CARBOXYHEMOGLOBIN
Carboxyhemoglobin: 2.1 % — ABNORMAL HIGH (ref 0.5–1.5)
Methemoglobin: 0.6 % (ref 0.0–1.5)
O2 Saturation: 62.9 %
TOTAL HEMOGLOBIN: 14.5 g/dL (ref 13.5–18.0)

## 2013-11-29 LAB — CBC
HCT: 46.5 % (ref 39.0–52.0)
Hemoglobin: 14.4 g/dL (ref 13.0–17.0)
MCH: 28.1 pg (ref 26.0–34.0)
MCHC: 31 g/dL (ref 30.0–36.0)
MCV: 90.8 fL (ref 78.0–100.0)
PLATELETS: 573 10*3/uL — AB (ref 150–400)
RBC: 5.12 MIL/uL (ref 4.22–5.81)
RDW: 17.8 % — ABNORMAL HIGH (ref 11.5–15.5)
WBC: 15.4 10*3/uL — ABNORMAL HIGH (ref 4.0–10.5)

## 2013-11-29 LAB — MAGNESIUM: Magnesium: 2 mg/dL (ref 1.5–2.5)

## 2013-11-29 MED ORDER — WHITE PETROLATUM GEL
Status: AC
  Administered 2013-11-29: 22:00:00
  Filled 2013-11-29: qty 5

## 2013-11-29 MED ORDER — DEXTROSE 5 % IV SOLN
1.0000 g | Freq: Three times a day (TID) | INTRAVENOUS | Status: DC
  Administered 2013-11-29 – 2013-12-04 (×15): 1 g via INTRAVENOUS
  Filled 2013-11-29 (×20): qty 1

## 2013-11-29 MED ORDER — DEXTROSE 5 % IV SOLN
1.0000 g | Freq: Once | INTRAVENOUS | Status: DC
  Administered 2013-11-29: 1 g via INTRAVENOUS
  Filled 2013-11-29: qty 10

## 2013-11-29 NOTE — Progress Notes (Addendum)
[place] TEAM 1 - Stepdown/ICU TEAM Progress Note  Fernando Lane BWI:[id number] DOB: [birth year] DOA: 11/15/2013 PCP: Gwendolyn Grant, MD  Admit HPI / Brief Narrative: 63 year old WM PMHx polycythemia vera, OSA on nocturnal CPAP, CHF, and CAD (2 vessel disease with medical management only). He resides at [facility] where he had developed SOB 9/3 am. He was transported to [facility] ED where his workup was concerning for sepsis vs CHF exacerbation. He was placed on BiPAP initially, but eventually required intubation. He received 4 liters of NS. He was then transferred to [facility] for ICU admission.    HPI/Subjective: 9/17 patient states that he feels significantly improved and is ready for discharge dependent on vascular surgery determination if he will have metatarsal removed prior to discharge. Last week patient stated his baseline weight is 235 pounds, currently bed weight= 196.6 pounds (89.2 kg). Currently has negative SOB, negative CP.  Assessment/Plan: Cardiogenic shock; volume overload  - multifactorial, resolved; Mildly elevated trop I (peak 2.63 on 9/04)  -Inoperable CAD  -Recent STEMI 08/07/13, medical management recommended -Continue Lasix 60 mg  BID -Continue aspirin 325 mg daily -Continue spironolactone 25 mg  Acute on chronic biventricular heart failure -See cardiogenic shock/volume overload -Daily weights; will use patient's current weight as his dry weight -Strict in and out -Monitor electrolytes daily  CAD  -s/p out-of -hospital LAD infarct 5/15, patient also had likely RV infarction.  -LAD and RCA 100% occluded. 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 goal > 4; check daily -Check  magnesium daily recurrent   Dysphagia of unclear etiology  -Resolved, continue heart diet    Polycythemia vera  -Patient sees Dr. Concha Norway (oncology) -(baseline plt count 800k) ; has trended up over the past few days, however still below his baseline of 800 K.  -Patient has been afebrile, leukocytosis part of his PCV (WBC baseline ~appears to be 15+); stable -Dr. Juliann Mule (oncology) is his oncologist.   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    Dry gangrene of fourth right toe - Severe PAD  -Spoke with Dr. Trula Slade (Vascular Surgery) procedure patient today and determine if palpitation of fourth right metatarsal will be performed prior to discharge.  UTI Pseudomonas and Escherichia coli -Patient was treated with ceftriaxone for initial UTI unfortunately does not cover Pseudomonas. -Start cefepime per pharmacy; susceptibilities pending  Chronic pain - severe debilitation; continue oxycodone IR    Code Status: FULL Family Communication: no family present at time of exam Disposition Plan: Resolution of anasarca, amputation of the fourth right metatarsal    Consultants: Dr. Loralie Champagne (cardiology; heart failure) Dr. Merton [facility] Dr. Ruta Hinds (vascular surgery) Vascular surgery consult pending    Procedure/Significant Events: 9/3 Intubated in [facility] ED, transferred to [facility]  9/4 Extubated. Trop I elevated @ 2.63  9/04 WOC consult: Entire buttocks and posterior scrotum with erythremia and patchy areas of skin loss. Right foot is purple and mottled, some toes with dry intact eschar. Barrier cream to scrotum and buttocks to protect, repel moisture, and  promote healing. Foam dressing to outer leg wound. If aggressive plan of care is desired to right foot, please consult VVS team for further plan of care.  9/04 VVS  Consult: This is a 63 y.o. 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  -foot with gangrene, but doubt this is the source of his sepsis. Plavix discontinued in anticipation for possible surgery  9/4 TTE: Limited study, akinetic apex with unusual appearance, cannot rule out distal muscular VSD, mildly dilated RA, severely dilated RV, moderately dilated RA moderate TR, no RVSP estimate given  9/05 Cards Consult: consider cardiac MRI to further elucidate the abnormal LV anatomy and clarify cause of RV dilation  9/07 Weaning off vasopressors  9/08 Off vasopressors. Diuresis ordered for severe hypervolemia. Discussed with Dr Debara Pickett re: Echo findings of severe RV dilation. Consider RHC when more stabilized.  9/08 Urology consult: see full note. Rec leaving Foley cath until penile and scrotal edema resolve  9/08 PM: Furosemide gtt initiated by Cardiology  9/09 Advanced Directives conversation initiated 9/09 by Dr Alva Garnet. Advised no CPR/ACLS and DNI or short term intubation only. Palliative Care consultation requested. Transfer to SDU ordered. TRH to assume care as of AM 9/10 and PCCM to sign off 9/10 TRH assumed care     Culture MRSA PCR 9/03 >> POS  Resp 9/03 >> NOF  Urine 9/03 >> NEG  Blood 9/04 >> NEG Urine 9/13 positive Pseudomonas aeruginosa, Escherichia coli   Antibiotics: Vanc 9/03 >> 9/07  Pip-tazo 9/03 >> 9/07  fidaxomicin 8/31 >> 9/09 Ceftriaxone 9/13>> stopped 9/17 Cefepime 9/17>>   DVT prophylaxis: Lovenox   Devices    LINES / TUBES:  ETT 9/03 >> 9/04  R IJ CVL 9/03 >>      Continuous Infusions:    Objective: VITAL SIGNS: Temp: 98.7 F (37.1 C) (09/17 0800) Temp src: Oral (09/17 0800) BP: 90/61 mmHg (09/17 0800) Pulse Rate: 88 (09/17 0800) SPO2; FIO2:   Intake/Output Summary (Last 24 hours) at 11/29/13 1021 Last data filed at 11/29/13 0615  Gross per 24 hour  Intake    270 ml  Output    975 ml  Net    -705 ml     Exam: General: A./O. x4, NAD, No acute respiratory distress Lungs: Clear to auscultation bilaterally without wheezes or crackles Cardiovascular: Regular rate and rhythm without murmur gallop or rub normal S1 and S2 Abdomen: Nontender, nondistended, (anasarca) soft, bowel sounds positive, no rebound, no ascites, no appreciable mass Skin; sacral excoriation covered with clean dressing. Extremities: Fourth right metatarsal dry gangrene, right lower extremity lateral aspect of calf ~5 cm in diameter ulcer with purulent discharge (starting to resolve) , No significant cyanosis, clubbing, or edema bilateral lower extremities, 1-2+ bilateral pedal the knees (vast improvement over last week's exam)  Data Reviewed: Basic Metabolic Panel:  Recent Labs Lab 11/25/13 0500 11/26/13 0440 11/27/13 0530 11/28/13 0500 11/29/13 0430  NA 136* 138 139 136* 137  K 4.7 5.0 4.4 4.3 4.5  CL 86* 90* 96 94* 95*  CO2 44* 39* 34* 34* 28  GLUCOSE 85 81 82 78 80  BUN 21 21 18 17 19   CREATININE 0.83 0.87 0.87 0.84 0.90  CALCIUM 9.3 9.0 9.0 9.0 8.9  MG 2.2 2.2 2.0 2.0 2.0   Liver Function Tests: No results found for this basename: AST, ALT, ALKPHOS, BILITOT, PROT, ALBUMIN,  in the last 168 hours No results found for this basename: LIPASE, AMYLASE,  in the  last 168 hours No results found for this basename: AMMONIA,  in the last 168 hours CBC:  Recent Labs Lab 11/25/13 1020 11/27/13 0530 11/28/13 0500 11/29/13 0430  WBC 18.0* 16.5* 16.3* 15.4*  NEUTROABS 16.1*  --   --   --   HGB 15.1 14.1 14.2 14.4  HCT 48.5 45.6 45.9 46.5  MCV 91.0 90.8 91.4 90.8  PLT 405* 505* 562* 573*   Cardiac Enzymes: No results found for this basename: CKTOTAL, CKMB, CKMBINDEX, TROPONINI,  in the last 168 hours BNP (last 3 results)  Recent Labs  08/07/13 1600 08/23/13 1759 11/15/13 1951  PROBNP 13375.0* 9383.0* 9486.0*   CBG:  Recent Labs Lab 11/24/13 1149  GLUCAP 88    Recent Results (from the  past 240 hour(s))  URINE CULTURE     Status: None   Collection Time    11/25/13  4:30 PM      Result Value Ref Range Status   Specimen Description URINE, CATHETERIZED   Final   Special Requests NONE   Final   Culture  Setup Time     Final   Value: 11/25/2013 19:29     Performed at [place]     Final   Value: >=100,000 COLONIES/ML     Performed at [facility]   Culture     Final   Value: PSEUDOMONAS AERUGINOSA     ESCHERICHIA COLI     Performed at [facility]   Report Status PENDING   Incomplete     Studies:  Recent x-ray studies have been reviewed in detail by the Attending Physician  Scheduled Meds:  Scheduled Meds: . aspirin  325 mg Oral Daily  . atorvastatin  80 mg Oral q1800  . bacitracin   Topical BID  . carvedilol  3.125 mg Oral BID WC  . cefTRIAXone (ROCEPHIN)  IV  1 g Intravenous Q24H  . enoxaparin (LOVENOX) injection  45 mg Subcutaneous Daily  . feeding supplement (ENSURE COMPLETE)  237 mL Oral BID BM  . furosemide  60 mg Oral BID  . Gerhardt's butt cream   Topical BID  . Influenza vac split quadrivalent PF  0.5 mL Intramuscular Tomorrow-1000  . lisinopril  2.5 mg Oral QPM  . senna-docusate  1 tablet Oral QHS  . spironolactone  25 mg Oral Daily    Time spent on care of this patient: 40 mins   Allie Bossier , MD   [facility] Office  [phone] Pager [phone]  On-Call/Text Page:      [email or web address]      password [email or web address]  If 7PM-7AM, please contact night-coverage [email or web address] Password [email or web address] 11/29/2013, 10:21 AM   LOS: 14 days

## 2013-11-29 NOTE — Progress Notes (Signed)
     Subjective  - Doing better. " If you are going to take the toe off I'd rather you do it now than me coming back from rehab."    History of Present Illness: This is a 63 y.o. male who is known to Dr. Oneida Alar for gangrenous changes to the toes on the right foot. He underwent arteriogram on Aug 08, 2013, which revealed unreconstructable tibial artery occlusive disease. At that time, he did have pain in his foot, but he was tolerating it. He is minimally ambulatory. On October 11, 2013, he was offered a right BKA versus observation. Dr. Oneida Alar discussed with the pt that if he had evidence of ascending worsening infection or unrelenting pain from the right foot, he would need an amputation.   Objective 90/61 88 97.8 F (36.6 C) (Oral) 18 95%  Intake/Output Summary (Last 24 hours) at 11/29/13 1650 Last data filed at 11/29/13 1100  Gross per 24 hour  Intake    700 ml  Output   1026 ml  Net   -326 ml    Doppler DP right, doppler left Left 4th toe tip dry gangrene Left lateral shin wound superficial wound with improvement from initial consult.. 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  Wounds show significant improvement, except for right 4th distal toe dry gangrene.    Completed abx for C diff. BP was stabilizing, but today has begun to dip in to the 40X systolic again, coincident w/ resumption of coreg. Monitor in SDU for now. Pt is tolerating well for now.  Pseudomembranous colitis  PCR positive 8/31 - 10 days Fidaxomicin completed 9/09 - watch for recurrence on rocephin - actually complaints of constipation today  UTI  UA c/w UTI - giving short course of ceftriaxone and watching for signs of C diff recurrence - urine culture >100k colonies speciation pending - Urology had to place foley, w/ suggestion to "leave in for now ... until edema improves" - no edema on exam - will attempt to remove  foley and follow   Plan for Dr. Oneida Alar to remove right 4th toe Monday 12/03/2013.  As long as he is stable from a cardiac stand point.  Laurence Slate [facility] 11/29/2013 4:50 PM --  Laboratory Lab Results:  Recent Labs  11/28/13 0500 11/29/13 0430  WBC 16.3* 15.4*  HGB 14.2 14.4  HCT 45.9 46.5  PLT 562* 573*   BMET  Recent Labs  11/28/13 0500 11/29/13 0430  NA 136* 137  K 4.3 4.5  CL 94* 95*  CO2 34* 28  GLUCOSE 78 80  BUN 17 19  CREATININE 0.84 0.90  CALCIUM 9.0 8.9    COAG Lab Results  Component Value Date   INR 1.59* 11/15/2013   INR 1.24 08/23/2013   INR 1.55* 08/08/2013   No results found for this basename: PTT

## 2013-11-29 NOTE — Progress Notes (Signed)
NUTRITION FOLLOW UP  Intervention: D/C Ensure Complete; no further nutrition intervention at this time -- pt declined RD to follow for nutrition care plan  Nutrition Dx: Inadequate oral intake, resolved  New Nutrition Dx: Increased nutrient needs related to wound healing as evidenced by estimated nutrition needs, ongoing  Goal: Pt to meet >/= 90% of their estimated nutrition needs, met  Monitor:  PO & supplemental intake, weight, labs, I/O's  ASSESSMENT: 63 yo male presented to [facility] ER with dyspnea and septic shock >> needed intubation. Recently Dx with C diff, and has dry gangrene of Rt foot. Transferred to [facility] for further tx. Work-up at [facility] ED was concerning for sepsis vs CHF exacerbation.   Patient extubated 9/4.  Transferred to 2C-Stepdown from 44M-MICU 9/9.  Patient currently on a Heart Healthy diet.  PO intake 100% per flowsheet records.  Doesn't like Ensure Complete supplements stating it's "not a real shake;" will discontinue.  RD offered other supplement options, however, pt declining at this time.  Height: Ht Readings from Last 1 Encounters:  11/16/13 _0  (1.676 m)    Weight: Wt Readings from Last 1 Encounters:  11/29/13 196 lb 10.4 oz (89.2 kg)    BMI:  Body mass index is 31.76 kg/(m^2).   Estimated Nutritional Needs: Kcal: 2000-2200 Protein: 120-130 gm Fluid: 2-2.2 L  Skin: pressure ulcer (DTI) to sacrum; diabetic ulcer to right leg; black toes on right foot  Diet Order: Cardiac    Intake/Output Summary (Last 24 hours) at 11/29/13 1154 Last data filed at 11/29/13 1100  Gross per 24 hour  Intake    750 ml  Output   1276 ml  Net   -526 ml    Labs:   Recent Labs Lab 11/27/13 0530 11/28/13 0500 11/29/13 0430  NA 139 136* 137  K 4.4 4.3 4.5  CL 96 94* 95*  CO2 34* 34* 28  BUN _1 CREATININE 0.87 0.84 0.90  CALCIUM 9.0 9.0 8.9  MG 2.0 2.0 2.0  GLUCOSE 82 78 80    Scheduled Meds: . aspirin  325 mg Oral Daily  .  atorvastatin  80 mg Oral q1800  . bacitracin   Topical BID  . carvedilol  3.125 mg Oral BID WC  . cefTRIAXone (ROCEPHIN)  IV  1 g Intravenous Once  . enoxaparin (LOVENOX) injection  45 mg Subcutaneous Daily  . feeding supplement (ENSURE COMPLETE)  237 mL Oral BID BM  . furosemide  60 mg Oral BID  . Gerhardt's butt cream   Topical BID  . Influenza vac split quadrivalent PF  0.5 mL Intramuscular Tomorrow-1000  . lisinopril  2.5 mg Oral QPM  . senna-docusate  1 tablet Oral QHS  . spironolactone  25 mg Oral Daily    Continuous Infusions:    Past Medical History  Diagnosis Date  . Chronic low back pain   . 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 Chronic neck pain   . OSA (obstructive sleep apnea)     CPAP @ bedtime  . Allergy   . Unspecified essential hypertension     Resistant, 2D Echo - EF-55-60  . Polycythemia   . CAD (coronary artery disease)     Vessel type unspecified  . PVD (peripheral vascular disease)   . DDD (degenerative disc disease), cervical     Past Surgical History  Procedure Laterality Date  . Lower extrmity doppler      [place] 2008; no evidence  for PAD  . US echocardiography  11/2008    Normal valves, mild LAE  . Left knee replacement  10/2006  . Cervical spine surgery  02/2008  . Joint replacement    . Spine surgery      Arthur Holms, RD, LDN Pager #: [phone] After-Hours Pager #: [phone]

## 2013-11-30 LAB — URINE CULTURE: Colony Count: >=100000

## 2013-11-30 LAB — GLUCOSE, CAPILLARY
GLUCOSE-CAPILLARY: 140 mg/dL — AB (ref 70–99)
Glucose-Capillary: 104 mg/dL — ABNORMAL HIGH (ref 70–99)
Glucose-Capillary: 87 mg/dL (ref 70–99)

## 2013-11-30 LAB — MAGNESIUM: Magnesium: 1.8 mg/dL (ref 1.5–2.5)

## 2013-11-30 MED ORDER — SACCHAROMYCES BOULARDII 250 MG PO CAPS
250.0000 mg | ORAL_CAPSULE | Freq: Two times a day (BID) | ORAL | Status: DC
  Administered 2013-11-30 – 2013-12-04 (×8): 250 mg via ORAL
  Filled 2013-11-30 (×9): qty 1

## 2013-11-30 NOTE — Progress Notes (Signed)
  [facility] Progress Note  11/30/2013 5:12 PM  Subjective:  Doing well. No complaints.   Tmax 98.6 BP sys 80s-100s 02 97% RA   Filed Vitals:   11/30/13 1537  BP: 93/52  Pulse: 95  Temp: 98.6 F (37 C)  Resp: 18    Physical Exam: Extremities:  Right 4th toe dry gangrene stable. No fluctuance or drainage seen. Non palpable pedal pulses.   Assessment:  63 y.o. male with unreconstructable tibial artery occlusive disease with gangrene of the right 4th toe  Plan: -Plan for amputation of right 4th toe on Monday 12/03/13 with Dr. Oneida Alar.  -DVT prophylaxis:  Lovenox.    Virgina Jock, PA-C [facility] Office: [phone] Pager: [phone] 11/30/2013 5:12 PM

## 2013-11-30 NOTE — Progress Notes (Signed)
Physical Therapy Treatment Patient Details Name: Fernando Lane MRN: [id number] DOB: 12/13/1950 Today's Date: 11/30/2013    History of Present Illness 63 yo male admitted from SNF in [place] after recent hospital admission for ischemic Rt LE and Nstemi. pt s/p fall at snf with new spleen contusion and Rib fx. Pt now readmitted with HTN, sepsis due to pyelonephritis, and metabolic encephalopathy.    PT Comments    Pt limited in mobility due to incr Lt knee pain and fear of knee buckling and falling (as has happened at home). Pt would benefit from a hinged, locking (Bledsoe-type) knee brace for support in walking. This will require an orthotist consult to obtain. (Do not feel a knee immobilizer will work due to knee flexion contracture which will cause incr friction and pressure over patella and posterior leg, which would result in skin breakdown.) Pt very motivated and wants to incr his independence, however is at incr risk of falling due to knee pain and buckling.     Follow Up Recommendations  SNF;Supervision/Assistance - 24 hour     Equipment Recommendations  None recommended by PT    Recommendations for Other Services       Precautions / Restrictions Precautions Precautions: Fall Precaution Comments: reports his Lt knee gives out and he fell Restrictions Weight Bearing Restrictions: No    Mobility  Bed Mobility                  Transfers Overall transfer level: Needs assistance Equipment used: Rolling walker (2 wheeled) Transfers: Sit to/from Stand Sit to Stand: +2 physical assistance;Min assist;Min guard (progressed to +1 with repetition/incr confidence)         General transfer comment: cues for UE use, sequencing, safe use of RW.  Very little facilitation required for anterior weight shift; repeated x 8  Ambulation/Gait             General Gait Details: pt fearful of Lt knee "giving out" and pt deferred ambulation this date   Stairs             Wheelchair Mobility    Modified Rankin (Stroke Patients Only)       Balance           Standing balance support: Bilateral upper extremity supported Standing balance-Leahy Scale: Poor                      Cognition Arousal/Alertness: Awake/alert Behavior During Therapy: WFL for tasks assessed/performed Overall Cognitive Status: Within Functional Limits for tasks assessed                      Exercises General Exercises - Lower Extremity Ankle Circles/Pumps: AROM;Both;5 reps Long Arc Quad: AROM;Left;5 reps;Limitations Other Exercises Other Exercises: Provided handout with general exercises in sitting and supine and reviewed proper technique, frequency with pt    General Comments        Pertinent Vitals/Pain Pain Assessment: 0-10 Pain Score: 5  Pain Location: Lt knee in standing Pain Intervention(s): Limited activity within patient's tolerance;Monitored during session;Repositioned    Home Living                      Prior Function            PT Goals (current goals can now be found in the care plan section) Acute Rehab PT Goals Patient Stated Goal: To go to NH for therapy PT Goal Formulation: With patient Time  For Goal Achievement: 12/14/13 Potential to Achieve Goals: Good Progress towards PT goals: Goals met and updated - see care plan    Frequency  Min 2X/week    PT Plan Current plan remains appropriate    Co-evaluation             End of Session Equipment Utilized During Treatment: Gait belt Activity Tolerance: Patient limited by pain Patient left: with call bell/phone within reach;with chair alarm set;in bed (nsg ok'd sitting EOB (pt preference))     Time: 3094-0768 PT Time Calculation (min): 28 min  Charges:  $Therapeutic Exercise: 8-22 mins $Therapeutic Activity: 8-22 mins                    G Codes:      Graycee Greeson 12/21/2013, 4:39 PM Pager [phone]

## 2013-11-30 NOTE — Progress Notes (Signed)
[place] TEAM 1 - Stepdown/ICU TEAM Progress Note  Fernando Lane PNT:[id number] DOB: 09-11-1950 DOA: 11/15/2013 PCP: Gwendolyn Grant, MD  Admit HPI / Brief Narrative: 63 year old M SNF resident w/ Hx polycythemia vera, OSA on nocturnal CPAP, CHF, and CAD (2 vessel disease with medical management only) who developed SOB 9/3 am. He was transported to [facility] ED with septic shock, was intubated, and placed on pressors. Transferred to [facility] ICU 9/03, per PCCM. Diagnosed with C diff prior to admission. Transferred to [facility] [place] 9/10.  Significant Events: 9/3 Intubated in [place] ED, transferred to [facility]  9/4 Extubated. Trop I elevated @ 2.63  9/04 WOC consult: Entire buttocks and posterior scrotum with erythremia and patchy areas of skin loss. Right foot is purple and mottled, some toes with dry intact eschar.   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  -doubt this is the source of his sepsis. Plavix discontinued in anticipation for possible surgery  9/4 TTE: Limited study, akinetic apex with unusual appearance, cannot rule out distal muscular VSD, mildly dilated RA, severely dilated RV, moderately dilated RA moderate TR, no RVSP estimate given  9/05 Cards Consult: consider cardiac MRI to further elucidate the abnormal LV anatomy and clarify cause of RV dilation  9/07 Weaning off vasopressors  9/08 Off vasopressors. Diuresis ordered for severe hypervolemia. Discussed with Dr Debara Pickett re: Echo findings of severe RV dilation. Consider RHC when more stabilized.  9/08 Urology consult: Rec leaving Foley cath until penile and scrotal edema resolve  9/08 PM: Furosemide gtt initiated by Cardiology  9/09 Advanced Directives conversation initiated 9/09 by Dr Alva Garnet. Advised no CPR/ACLS and DNI or short term intubation only. Palliative Care consultation requested. Transfer to SDU ordered.  9/10 TRH assumed care  9/11 > 9/16 uneventful SDU stay w/ gradual  improvement in general medical condition  9/17 transfer to medical bed - determination made to proceed w/ vascular surgery on 9/21  HPI/Subjective: In good spirits with no new complaints.  Denies cp, n/v, sob, or abdom pain.  Had a formed/normal stool this morning.    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 - 10 days Fidaxomicin completed 9/09 - watch for recurrence on abx  UTI - Pseudomonas and Escherichia coli UA c/w UTI - given ceftriaxone initially but speciation and sensitivities necessitate change to cefepime - high risk for C diff recurrence - will plan to tx for short 5 day course and follow   Dry gangrene of fourth right toe - Severe PAD Vascular Surgery plan is for amputation Monday - all large vessels were patent on recent arteriogram  Asymptomatyic hypotension see above - recurring   Acute on chronic biventricular heart failure Cardiology attending to diuresis/fluid balance - not LVAD candidate with severe RV failure and gangrene - EF approximately 35%, RV severely dilated - agrree pt clearly preload depending - may not be able to tolerate both ACE and BB - follow    Inoperable CAD 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 Juliann Mule ([place]) - baseline ptl count ~800K - baseline WBC reportedly 15  Chronic pain / severe debilitation continue  oxycodone IR - Palitiative Care following - long term prognosis guarded   Obesity - Body mass index is 31.43 kg/(m^2).  MRSA screen +  Code Status: NCB/DNR Family Communication: no family present at time of exam today  Disposition Plan: cont PT/OT - D/C to SNF for rehab probable after vascular  procedure planned for Monday, 9/21  Consultants: Cardiology  Vascular Surgery Palliative Care  PCCM  Antibiotics: Vanc 9/03 >> 9/07  Pip-tazo 9/03 >> 9/07  Fidaxomicin 8/31 >> 9/09 Ceftriaxone 9/13 > 9/17 Cefepime 9/17 >   DVT prophylaxis: Lovenox  LINES:  R IJ CVL 9/03 > 9/18  Objective: Blood pressure 93/52, pulse 95, temperature 98.6 F (37 C), temperature source Oral, resp. rate 18, height 5\' 6"  (1.676 m), weight 88.3 kg (194 lb 10.7 oz), SpO2 97.00%.  Intake/Output Summary (Last 24 hours) at 11/30/13 1625 Last data filed at 11/30/13 1536  Gross per 24 hour  Intake    340 ml  Output    100 ml  Net    240 ml   Exam: General: no acute resp distress - alert and conversant - R neck CVL in place  Lungs: clear to auscultation bilaterally without wheezes or crackles Cardiovascular: regular rate and rhythm without murmur gallop or rub Abdomen: nontender, nondistended, soft, bowel sounds positive, no rebound Extremities: fourth right toe dry gangrene w/o change in exam    Data Reviewed: Basic Metabolic Panel:  Recent Labs Lab 11/25/13 0500 11/26/13 0440 11/27/13 0530 11/28/13 0500 11/29/13 0430 11/30/13 0500  NA 136* 138 139 136* 137  --   K 4.7 5.0 4.4 4.3 4.5  --   CL 86* 90* 96 94* 95*  --   CO2 44* 39* 34* 34* 28  --   GLUCOSE 85 81 82 78 80  --   BUN 21 21 18 17 19   --   CREATININE 0.83 0.87 0.87 0.84 0.90  --   CALCIUM 9.3 9.0 9.0 9.0 8.9  --   MG 2.2 2.2 2.0 2.0 2.0 1.8   Liver Function Tests: No results found for this basename: AST, ALT, ALKPHOS, BILITOT, PROT, ALBUMIN,  in the last 168 hours  CBC:  Recent Labs Lab 11/25/13 1020 11/27/13 0530 11/28/13 0500 11/29/13 0430  WBC 18.0* 16.5* 16.3* 15.4*  NEUTROABS 16.1*  --   --   --   HGB 15.1 14.1 14.2 14.4  HCT 48.5 45.6 45.9 46.5  MCV 91.0 90.8 91.4 90.8  PLT 405* 505* 562* 573*   BNP (last 3 results)  Recent Labs  08/07/13 1600 08/23/13 1759 11/15/13 1951  PROBNP 13375.0* 9383.0*  9486.0*   CBG:  Recent Labs Lab 11/24/13 1149 11/30/13 1102  GLUCAP 88 104*   Studies:  Recent x-ray studies have been reviewed in detail by the Attending Physician  Scheduled Meds:  Scheduled Meds: . aspirin  325 mg Oral Daily  . atorvastatin  80 mg Oral q1800  . bacitracin   Topical BID  . carvedilol  3.125 mg Oral BID WC  . ceFEPime (MAXIPIME) IV  1 g Intravenous 3 times per day  . enoxaparin (LOVENOX) injection  45 mg Subcutaneous Daily  . furosemide  60 mg Oral BID  . Gerhardt's butt cream   Topical BID  . Influenza vac split quadrivalent PF  0.5 mL Intramuscular Tomorrow-1000  . lisinopril  2.5 mg Oral QPM  . senna-docusate  1 tablet Oral QHS  . spironolactone  25 mg Oral Daily   Time spent on care of  this patient: 77 mins  Cherene Altes, MD [facility] For Consults/Admissions - Flow Manager - [phone] Office  [phone] Pager [phone]  On-Call/Text Page:      [email or web address]      password [facility]  11/30/2013, 4:25 PM   LOS: 15 days

## 2013-11-30 NOTE — Progress Notes (Signed)
CVP discontinued. Informed patient that he will transferring to another unit 3E12.  He refused the air mattress saying that he wants to be able to get out of bed. He was transferred via wheelchair.  Family was notified by the patient himself.

## 2013-11-30 NOTE — Progress Notes (Signed)
Patient GN:OIBBCW Fernando Lane      DOB: February 24, 1951      UGQ:[id number]  Shadowing with you.  Note evaluation for amputation.  Checked in with patient few days ago. Plan remained return to rehab at discharge. See original Goals of Care.   Jesus Nevills L. Lovena Le, MD MBA The Palliative Medicine Team at [facility] Phone: [phone] Pager: [phone] ( Use team phone after hours)

## 2013-11-30 NOTE — Progress Notes (Signed)
0950 Transferred in from [place] via wheelchair orientation to unit and equipment done safety precaution observed

## 2013-11-30 NOTE — Progress Notes (Signed)
Placed pt on CPAP. Pt tolerating well at this time.  

## 2013-11-30 NOTE — Progress Notes (Signed)
Aware of patient per transfer report by Domenica Reamer, LCSWA.  Resident of [facility]. Family is wanting transfer to [place] when stable. Patient anticipated to have toe amputation on Monday. CSW will follow and assist with d/c disposition as indicated.  Lorie Phenix. Pauline Good, [place]

## 2013-11-30 NOTE — Progress Notes (Signed)
Per MD order, central line removed. IV cathter intact. Vaseline pressure gauze to site, pressure held x 5 min, no bleeding to site. Pt instructed not to get out of bed for 30 min after the removal of the central line. Instucted to keep dressing CDI x 24hours, if bleeding occurs hold pressure, if bleeding does not stop contact MD or go to the ED. Pt verbalized understanding and did not have any questions. Fernando Lane M  

## 2013-11-30 NOTE — Progress Notes (Signed)
Patient ID: Fernando Lane, male   DOB: December 22, 1950, 63 y.o.   MRN: [id number] ADVANCED HEART FAILURE ROUNDING NOTE DAILY PROGRESS NOTE  Subjective:  Stable this morning.  Denies SOB/CP. CVP 9.  Up in chair yesterday.  On cefepime for UTI. Has been getting out of bed to chair.   Patient now DNR.   Objective:  Temp:  [97.4 F (36.3 C)-98.7 F (37.1 C)] 97.7 F (36.5 C) (09/18 0413) Pulse Rate:  [85-89] 89 (09/18 0534) Resp:  [11-19] 14 (09/18 0413) BP: (88-100)/(57-65) 88/57 mmHg (09/18 0413) SpO2:  [95 %] 95 % (09/17 0800) Weight:  [195 lb (88.451 kg)] 195 lb (88.451 kg) (09/18 0413) Weight change: -1 lb 10.4 oz (-0.749 kg)  Intake/Output from previous day: 09/17 0701 - 09/18 0700 In: 580 [P.O.:480; IV Piggyback:100] Out: 301 [Urine:300; Stool:1]  Intake/Output from this shift:    Scheduled Meds: . aspirin  325 mg Oral Daily  . atorvastatin  80 mg Oral q1800  . bacitracin   Topical BID  . carvedilol  3.125 mg Oral BID WC  . ceFEPime (MAXIPIME) IV  1 g Intravenous 3 times per day  . enoxaparin (LOVENOX) injection  45 mg Subcutaneous Daily  . furosemide  60 mg Oral BID  . Gerhardt's butt cream   Topical BID  . Influenza vac split quadrivalent PF  0.5 mL Intramuscular Tomorrow-1000  . lisinopril  2.5 mg Oral QPM  . senna-docusate  1 tablet Oral QHS  . spironolactone  25 mg Oral Daily   Continuous Infusions:  PRN Meds:.sodium chloride, albuterol, bisacodyl, docusate sodium, fentaNYL, oxyCODONE  Physical Exam: General:  Chronically ill-appearing. No resp difficulty HEENT: normal Neck: supple. JVP ?8 cm. Carotids 2+ bilat; no bruits. RIJ CVL Cor: RRR distant Lungs: clear anteriorly Abdomen: markedly obese mildly distended.  Good bowel sounds. Extremities: no cyanosis, clubbing, rash.  No edema. decubitus ulcers covered. Gangrenous toe R foot   Neuro: alert & orientedx3, cranial nerves grossly intact. moves all 4 extremities w/o difficulty. Affect pleasant   Lab  Results: Results for orders placed during the hospital encounter of 11/15/13 (from the past 48 hour(s))  MAGNESIUM     Status: None   Collection Time    11/29/13  4:30 AM      Result Value Ref Range   Magnesium 2.0  1.5 - 2.5 mg/dL  BASIC METABOLIC PANEL     Status: Abnormal   Collection Time    11/29/13  4:30 AM      Result Value Ref Range   Sodium 137  137 - 147 mEq/L   Potassium 4.5  3.7 - 5.3 mEq/L   Chloride 95 (*) 96 - 112 mEq/L   CO2 28  19 - 32 mEq/L   Glucose, Bld 80  70 - 99 mg/dL   BUN 19  6 - 23 mg/dL   Creatinine, Ser 0.90  0.50 - 1.35 mg/dL   Calcium 8.9  8.4 - 10.5 mg/dL   GFR calc non Af Amer 88 (*) >90 mL/min   GFR calc Af Amer >90  >90 mL/min   Comment: (NOTE)     The eGFR has been calculated using the CKD EPI equation.     This calculation has not been validated in all clinical situations.     eGFR's persistently <90 mL/min signify possible Chronic Kidney     Disease.   Anion gap 14  5 - 15  CBC     Status: Abnormal   Collection Time  11/29/13  4:30 AM      Result Value Ref Range   WBC 15.4 (*) 4.0 - 10.5 K/uL   RBC 5.12  4.22 - 5.81 MIL/uL   Hemoglobin 14.4  13.0 - 17.0 g/dL   HCT 46.5  39.0 - 52.0 %   MCV 90.8  78.0 - 100.0 fL   MCH 28.1  26.0 - 34.0 pg   MCHC 31.0  30.0 - 36.0 g/dL   RDW 17.8 (*) 11.5 - 15.5 %   Platelets 573 (*) 150 - 400 K/uL  CARBOXYHEMOGLOBIN     Status: Abnormal   Collection Time    11/29/13  4:44 AM      Result Value Ref Range   Total hemoglobin 14.5  13.5 - 18.0 g/dL   O2 Saturation 62.9     Carboxyhemoglobin 2.1 (*) 0.5 - 1.5 %   Methemoglobin 0.6  0.0 - 1.5 %  MAGNESIUM     Status: None   Collection Time    11/30/13  5:00 AM      Result Value Ref Range   Magnesium 1.8  1.5 - 2.5 mg/dL   Assessment: 1. 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  Not LVAD candidate with severe RV failure and gangrene.      --EF approximately 35%, RV severely dilated. Difficult study.  2. Anasarca 3. CAD s/p  out-of -hospital LAD infarct 5/15, patient also had likely RV infarction.      --LAD and RCA 100% occluded. Lcx 30% EF 20% -> medical management 4. 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 PAD, severe.  Dry gangrene right foot.  6. Morbid obesity 7. Acute on chronic respiratory failure 8. Severe decubiti 9. C difficile colitis: He has completed treatment.   10. Acute kidney injury, improved 11. Protein-calorie malnutrition 12. RUE Edema.  13. Hypomagnesemia 14. Hypotension 15. UTI: E coli and Pseudomonas grew  Plan/discussion:  BP stable. CVP still mildly elevated but given RV failure he is likely preload dependent and will not be too aggressive.  Continue current Lasix.  BP soft but tolerating low doses of Coreg and lisinopril.  Hold for SBP </=85.   Cefepime for UTI with growth of Pseudomonas and E coli.    Plan for right toe amputation, it appears, on 9/21 Monday.   Needs ongoing PT, ready for rehab from my standpoint but looks like he will be here until after 9/21 for surgery.     Rabecka Brendel,MD 7:29 AM 11/30/2013

## 2013-12-01 LAB — CBC
HEMATOCRIT: 44.9 % (ref 39.0–52.0)
HEMOGLOBIN: 14.1 g/dL (ref 13.0–17.0)
MCH: 28.3 pg (ref 26.0–34.0)
MCHC: 31.4 g/dL (ref 30.0–36.0)
MCV: 90 fL (ref 78.0–100.0)
Platelets: 723 10*3/uL — ABNORMAL HIGH (ref 150–400)
RBC: 4.99 MIL/uL (ref 4.22–5.81)
RDW: 17.9 % — AB (ref 11.5–15.5)
WBC: 16 10*3/uL — AB (ref 4.0–10.5)

## 2013-12-01 LAB — GLUCOSE, CAPILLARY
Glucose-Capillary: 99 mg/dL (ref 70–99)
Glucose-Capillary: 97 mg/dL (ref 70–99)
Glucose-Capillary: 92 mg/dL (ref 70–99)

## 2013-12-01 LAB — BASIC METABOLIC PANEL
ANION GAP: 13 (ref 5–15)
BUN: 26 mg/dL — ABNORMAL HIGH (ref 6–23)
CHLORIDE: 95 meq/L — AB (ref 96–112)
CO2: 26 meq/L (ref 19–32)
Calcium: 8.8 mg/dL (ref 8.4–10.5)
Creatinine, Ser: 1.19 mg/dL (ref 0.50–1.35)
GFR calc Af Amer: 73 mL/min — ABNORMAL LOW (ref >90)
GFR calc non Af Amer: 63 mL/min — ABNORMAL LOW (ref >90)
Glucose, Bld: 76 mg/dL (ref 70–99)
POTASSIUM: 4.7 meq/L (ref 3.7–5.3)
SODIUM: 134 meq/L — AB (ref 137–147)

## 2013-12-01 MED ORDER — SODIUM CHLORIDE 0.9 % IJ SOLN
3.0000 mL | Freq: Three times a day (TID) | INTRAMUSCULAR | Status: DC
  Administered 2013-12-01 – 2013-12-04 (×7): 3 mL via INTRAVENOUS

## 2013-12-01 NOTE — Progress Notes (Signed)
Subjective: Interval History: none.. comfortable. Reports some soreness of his right fourth toe when manipulated  Objective: Vital signs in last 24 hours: Temp:  [98 F (36.7 C)-98.6 F (37 C)] 98 F (36.7 C) (09/19 0630) Pulse Rate:  [81-95] 84 (09/19 1124) Resp:  [14-18] 18 (09/19 0630) BP: (80-98)/(51-64) 98/64 mmHg (09/19 1124) SpO2:  [95 %-97 %] 95 % (09/19 0630) Weight:  [197 lb 12 oz (89.7 kg)] 197 lb 12 oz (89.7 kg) (09/19 0630)  Intake/Output from previous day: 09/18 0701 - 09/19 0700 In: 930 [P.O.:780; IV Piggyback:150] Out: 350 [Urine:350] Intake/Output this shift: Total I/O In: 3 [I.V.:3] Out: -   Dry gangrene right fourth toe. No erythema into his foot  Lab Results:  Recent Labs  11/29/13 0430 12/01/13 0346  WBC 15.4* 16.0*  HGB 14.4 14.1  HCT 46.5 44.9  PLT 573* 723*   BMET  Recent Labs  11/29/13 0430 12/01/13 0346  NA 137 134*  K 4.5 4.7  CL 95* 95*  CO2 28 26  GLUCOSE 80 76  BUN 19 26*  CREATININE 0.90 1.19  CALCIUM 8.9 8.8    Studies/Results: Dg Chest Port 1 View  11/20/2013   CLINICAL DATA:  Respiratory failure.  EXAM: PORTABLE CHEST - 1 VIEW  COMPARISON:  11/16/2013.  FINDINGS: Right lung base now appears clear. Persistent left lung base opacity is most consistent with atelectasis likely with a small residual effusion. No pulmonary edema or convincing pneumonia.  Stable cardiomegaly.  No mediastinal or hilar masses.  Right internal jugular central venous line is unchanged. All other support apparatus have been removed.  IMPRESSION: 1. Improved lung aeration when compared to the prior study. No residual pulmonary edema. Improved/resolved right lung base atelectasis. Mild residual left lung base atelectasis likely with a small effusion.   Electronically Signed   By: Lajean Manes M.D.   On: 11/20/2013 07:44   Dg Chest Port 1 View  11/16/2013   CLINICAL DATA:  Evaluate airspace disease.  EXAM: PORTABLE CHEST - 1 VIEW  COMPARISON:  11/15/2013   FINDINGS: Support devices are in stable position. Cardiomegaly with vascular congestion. Low lung volumes with bibasilar opacities and layering effusions. No real change since prior study.  IMPRESSION: Stable bibasilar opacities and layering effusions. Low lung volumes.  Cardiomegaly, vascular congestion   Electronically Signed   By: Rolm Baptise M.D.   On: 11/16/2013 07:34   Dg Chest Port 1 View  11/15/2013   CLINICAL DATA:  Shortness of breath. Evaluate endotracheal tube and central line.  EXAM: PORTABLE CHEST - 1 VIEW  COMPARISON:  Chest radiograph 11/15/2013.  FINDINGS: ET tube terminates in the mid trachea. NG tube courses inferior to the diaphragm. Right IJ central venous catheter tip projects over the expected location of the right internal jugular vein. Stable cardiomegaly. Persistent retrocardiac consolidation. Possible left pleural effusion.  IMPRESSION: ET tube terminates in the mid trachea.  Right sided central venous catheter tip projects over the expected location of the right internal jugular vein.  Retrocardiac consolidation may represent atelectasis or pneumonia.   Electronically Signed   By: Lovey Newcomer M.D.   On: 11/15/2013 19:48   Anti-infectives: Anti-infectives   Start     Dose/Rate Route Frequency Ordered Stop   12/15/13 1000  vancomycin (VANCOCIN) 50 mg/mL oral solution 125 mg  Status:  Discontinued     125 mg Oral Every 3 DAYS 11/15/13 1956 11/15/13 1956   12/07/13 1000  vancomycin (VANCOCIN) 50 mg/mL oral solution 125 mg  Status:  Discontinued     125 mg Oral Every other day 11/15/13 1956 11/15/13 1956   11/30/13 1000  vancomycin (VANCOCIN) 50 mg/mL oral solution 125 mg  Status:  Discontinued     125 mg Oral Daily 11/15/13 1956 11/15/13 1956   11/29/13 1600  ceFEPIme (MAXIPIME) 1 g in dextrose 5 % 50 mL IVPB     1 g 100 mL/hr over 30 Minutes Intravenous 3 times per day 11/29/13 1428 12/06/13 1359   11/29/13 1100  cefTRIAXone (ROCEPHIN) 1 g in dextrose 5 % 50 mL IVPB  Status:   Discontinued     1 g 100 mL/hr over 30 Minutes Intravenous  Once 11/29/13 1036 11/29/13 1427   11/25/13 1400  cefTRIAXone (ROCEPHIN) 1 g in dextrose 5 % 50 mL IVPB  Status:  Discontinued     1 g 100 mL/hr over 30 Minutes Intravenous Every 24 hours 11/25/13 1303 11/29/13 1036   11/22/13 2200  vancomycin (VANCOCIN) 50 mg/mL oral solution 125 mg  Status:  Discontinued     125 mg Oral 2 times daily 11/15/13 1956 11/15/13 1956   11/19/13 0900  vancomycin (VANCOCIN) IVPB 1000 mg/200 mL premix  Status:  Discontinued     1,000 mg 200 mL/hr over 60 Minutes Intravenous Every 24 hours 11/19/13 0821 11/19/13 0957   11/17/13 1400  vancomycin (VANCOCIN) 1,500 mg in sodium chloride 0.9 % 500 mL IVPB  Status:  Discontinued     1,500 mg 250 mL/hr over 120 Minutes Intravenous Every 48 hours 11/15/13 2116 11/19/13 0821   11/16/13 1015  fidaxomicin (DIFICID) tablet 200 mg  Status:  Discontinued     200 mg Oral 2 times daily 11/16/13 1014 11/21/13 1245   11/16/13 0500  piperacillin-tazobactam (ZOSYN) IVPB 3.375 g  Status:  Discontinued     3.375 g 12.5 mL/hr over 240 Minutes Intravenous Every 8 hours 11/15/13 2009 11/19/13 0957   11/15/13 2200  vancomycin (VANCOCIN) 50 mg/mL oral solution 125 mg  Status:  Discontinued     125 mg Oral 4 times daily 11/15/13 1956 11/15/13 1956   11/15/13 2100  vancomycin (VANCOCIN) 50 mg/mL oral solution 500 mg  Status:  Discontinued     500 mg Oral 4 times per day 11/15/13 1956 11/16/13 1015   11/15/13 2100  metroNIDAZOLE (FLAGYL) IVPB 500 mg  Status:  Discontinued     500 mg 100 mL/hr over 60 Minutes Intravenous Every 8 hours 11/15/13 1956 11/16/13 1015   11/15/13 2015  piperacillin-tazobactam (ZOSYN) IVPB 3.375 g     3.375 g 100 mL/hr over 30 Minutes Intravenous STAT 11/15/13 2005 11/15/13 2124      Assessment/Plan: s/p Procedure(s): AMPUTATION RIGHT FOURTH TOE (Right) 4 right fourth toe amputation on Monday. Discussed with the procedure with the patient including  possibility of nonhealing   LOS: 16 days   Rhylan Kagel 12/01/2013, 11:43 AM

## 2013-12-01 NOTE — Progress Notes (Signed)
Subjective: No CP  NO SOB   Objective: Filed Vitals:   11/30/13 2110 11/30/13 2323 12/01/13 0155 12/01/13 0630  BP: 80/51  87/57 93/57  Pulse: 88 91 81 85  Temp: 98.3 F (36.8 C)  98.3 F (36.8 C) 98 F (36.7 C)  TempSrc: Oral  Oral Oral  Resp: 18 14 18 18   Height:      Weight:    197 lb 12 oz (89.7 kg)  SpO2: 96% 96% 95% 95%   Weight change: -5.3 oz (-0.151 kg)  Intake/Output Summary (Last 24 hours) at 12/01/13 0924 Last data filed at 12/01/13 0656  Gross per 24 hour  Intake    930 ml  Output    250 ml  Net    680 ml   Net neg  23 L     General: Alert, awake, oriented x3, in no acute distress Neck:  JVP is normal Heart: Regular rate and rhythm, without murmurs, rubs, gallops.  Lungs: Clear to auscultation.  No rales or wheezes. Exemities:  No edema.   Neuro: Grossly intact, nonfocal.  Tele:  SR Lab Results: Results for orders placed during the hospital encounter of 11/15/13 (from the past 24 hour(s))  GLUCOSE, CAPILLARY     Status: Abnormal   Collection Time    11/30/13 11:02 AM      Result Value Ref Range   Glucose-Capillary 104 (*) 70 - 99 mg/dL  GLUCOSE, CAPILLARY     Status: None   Collection Time    11/30/13  4:44 PM      Result Value Ref Range   Glucose-Capillary 87  70 - 99 mg/dL  GLUCOSE, CAPILLARY     Status: Abnormal   Collection Time    11/30/13  9:00 PM      Result Value Ref Range   Glucose-Capillary 140 (*) 70 - 99 mg/dL   Comment 1 Documented in Chart     Comment 2 Notify RN    BASIC METABOLIC PANEL     Status: Abnormal   Collection Time    12/01/13  3:46 AM      Result Value Ref Range   Sodium 134 (*) 137 - 147 mEq/L   Potassium 4.7  3.7 - 5.3 mEq/L   Chloride 95 (*) 96 - 112 mEq/L   CO2 26  19 - 32 mEq/L   Glucose, Bld 76  70 - 99 mg/dL   BUN 26 (*) 6 - 23 mg/dL   Creatinine, Ser 1.19  0.50 - 1.35 mg/dL   Calcium 8.8  8.4 - 10.5 mg/dL   GFR calc non Af Amer 63 (*) >90 mL/min   GFR calc Af Amer 73 (*) >90 mL/min   Anion gap 13   5 - 15  CBC     Status: Abnormal   Collection Time    12/01/13  3:46 AM      Result Value Ref Range   WBC 16.0 (*) 4.0 - 10.5 K/uL   RBC 4.99  4.22 - 5.81 MIL/uL   Hemoglobin 14.1  13.0 - 17.0 g/dL   HCT 44.9  39.0 - 52.0 %   MCV 90.0  78.0 - 100.0 fL   MCH 28.3  26.0 - 34.0 pg   MCHC 31.4  30.0 - 36.0 g/dL   RDW 17.9 (*) 11.5 - 15.5 %   Platelets 723 (*) 150 - 400 K/uL    Studies/Results: No results found.  Medications:REviewed   @PROBHOSP @  1.  A/C biventricular CHF  Not candidate for LVAD   Volume status is pretty good    2..  CAD  S/p MI 5/15.  LAD and RCA 100% occluded.  No symptoms of angina  3.  Pericarditis (5/15)  No pain.    4  PAD  Gangrene of R foot.  Plan for amputation on Mon    5  C Diff colitis  Cmpeted Rx  6  CKD.    7  UTI  Pseudomonas and E Coli  On Cefepime.    Will follow on Monday.    LOS: 16 days   Dorris Carnes 12/01/2013, 9:24 AM

## 2013-12-01 NOTE — Progress Notes (Signed)
Patient [name]      DOB: May 29, 1950      MHD:[id number]  Shadowing with you through his amputation.  Patient has set his goals . Symptoms managed. Will follow up again on Monday.   Sorayah Schrodt L. Lovena Le, MD MBA The Palliative Medicine Team at [facility] Phone: [phone] Pager: [phone] ( Use team phone after hours)

## 2013-12-01 NOTE — Progress Notes (Signed)
[facility] PROGRESS NOTE  Fernando Lane OIN:[id number] DOB: Apr 30, 1950 DOA: 11/15/2013 PCP: Gwendolyn Grant, MD  Assessment/Plan: Septic shock  Completed abx for C diff - septic physiology resolved  Pseudomembranous colitis  PCR positive 8/31 - 10 days Fidaxomicin completed 9/09 UTI - Pseudomonas and Escherichia coli  UA c/w UTI - given ceftriaxone initially but speciation and sensitivities necessitate change to cefepime - high risk for C diff recurrence - monitor closely Dry gangrene of fourth right toe - Severe PAD  -Vascular Surgery plan is for amputation 9/21 -all large vessels were patent on recent arteriogram  Asymptomatyic hypotension  -Stable -Pt remains asymptomatic this AM Acute on chronic biventricular heart failure  Cardiology cont to address diuresis/fluid balance - not LVAD candidate with severe RV failure and gangrene - EF approximately 35%, RV severely dilated - agrree pt clearly preload depending - may not be able to tolerate both ACE and BB  Inoperable CAD  s/p out-of-hospital LAD infarct 5/15, patient also had likely RV infarction - LAD and RCA 100% occluded - Lcx 30% EF 20% -> medical management  Acute respiratory failure  resolved  OSA  Continue CPAP nightly per home routine  Polycythemia vera  Following Hgb - stable - followed by Dr. Juliann Mule ([facility]) - baseline ptl count ~800K - baseline WBC reportedly 15  -Blood counts remain stable Chronic pain / severe debilitation  continue oxycodone IR - Palitiative Care following - long term prognosis guarded  Obesity - Body mass index is 31.43 kg/(m^2).  MRSA screen +   Code Status: DNR Family Communication: Pt in room Disposition Plan: Pending   Consultants:  Vascular surgery  Cardiology  Procedures:    Antibiotics: Vanc 9/03 >> 9/07  Pip-tazo 9/03 >> 9/07  Fidaxomicin 8/31 >> 9/09  Ceftriaxone 9/13 > 9/17  Cefepime 9/17 >   HPI/Subjective: No complaints. Feels well  Objective: Filed  Vitals:   11/30/13 2323 12/01/13 0155 12/01/13 0630 12/01/13 1124  BP:  87/57 93/57 98/64   Pulse: 91 81 85 84  Temp:  98.3 F (36.8 C) 98 F (36.7 C)   TempSrc:  Oral Oral   Resp: 14 18 18    Height:      Weight:   89.7 kg (197 lb 12 oz)   SpO2: 96% 95% 95%     Intake/Output Summary (Last 24 hours) at 12/01/13 1411 Last data filed at 12/01/13 1124  Gross per 24 hour  Intake    933 ml  Output    250 ml  Net    683 ml   Filed Weights   11/30/13 0413 11/30/13 1007 12/01/13 0630  Weight: 88.451 kg (195 lb) 88.3 kg (194 lb 10.7 oz) 89.7 kg (197 lb 12 oz)    Exam:   General:  Awake, in nad  Cardiovascular: regular, perfused  Respiratory: normal resp effort, no audible wheezing  Abdomen: soft, obese, nondistended  Musculoskeletal: no clubbing  Data Reviewed: Basic Metabolic Panel:  Recent Labs Lab 11/26/13 0440 11/27/13 0530 11/28/13 0500 11/29/13 0430 11/30/13 0500 12/01/13 0346  NA 138 139 136* 137  --  134*  K 5.0 4.4 4.3 4.5  --  4.7  CL 90* 96 94* 95*  --  95*  CO2 39* 34* 34* 28  --  26  GLUCOSE 81 82 78 80  --  76  BUN 21 18 17 19   --  26*  CREATININE 0.87 0.87 0.84 0.90  --  1.19  CALCIUM 9.0 9.0 9.0 8.9  --  8.8  MG 2.2 2.0 2.0 2.0 1.8  --    Liver Function Tests: No results found for this basename: AST, ALT, ALKPHOS, BILITOT, PROT, ALBUMIN,  in the last 168 hours No results found for this basename: LIPASE, AMYLASE,  in the last 168 hours No results found for this basename: AMMONIA,  in the last 168 hours CBC:  Recent Labs Lab 11/25/13 1020 11/27/13 0530 11/28/13 0500 11/29/13 0430 12/01/13 0346  WBC 18.0* 16.5* 16.3* 15.4* 16.0*  NEUTROABS 16.1*  --   --   --   --   HGB 15.1 14.1 14.2 14.4 14.1  HCT 48.5 45.6 45.9 46.5 44.9  MCV 91.0 90.8 91.4 90.8 90.0  PLT 405* 505* 562* 573* 723*   Cardiac Enzymes: No results found for this basename: CKTOTAL, CKMB, CKMBINDEX, TROPONINI,  in the last 168 hours BNP (last 3 results)  Recent Labs   08/07/13 1600 08/23/13 1759 11/15/13 1951  PROBNP 13375.0* 9383.0* 9486.0*   CBG:  Recent Labs Lab 11/30/13 1102 11/30/13 1644 11/30/13 2100 12/01/13 1122  GLUCAP 104* 87 140* 99    Recent Results (from the past 240 hour(s))  URINE CULTURE     Status: None   Collection Time    11/25/13  4:30 PM      Result Value Ref Range Status   Specimen Description URINE, CATHETERIZED   Final   Special Requests NONE   Final   Culture  Setup Time     Final   Value: 11/25/2013 19:29     Performed at [facility]     Final   Value: >=100,000 COLONIES/ML     Performed at [facility]   Culture     Final   Value: PSEUDOMONAS AERUGINOSA     ESCHERICHIA COLI     Performed at [facility]   Report Status 11/30/2013 FINAL   Final   Organism ID, Bacteria PSEUDOMONAS AERUGINOSA   Final   Organism ID, Bacteria ESCHERICHIA COLI   Final     Studies: No results found.  Scheduled Meds: . aspirin  325 mg Oral Daily  . atorvastatin  80 mg Oral q1800  . bacitracin   Topical BID  . carvedilol  3.125 mg Oral BID WC  . ceFEPime (MAXIPIME) IV  1 g Intravenous 3 times per day  . enoxaparin (LOVENOX) injection  45 mg Subcutaneous Daily  . furosemide  60 mg Oral BID  . Gerhardt's butt cream   Topical BID  . Influenza vac split quadrivalent PF  0.5 mL Intramuscular Tomorrow-1000  . lisinopril  2.5 mg Oral QPM  . saccharomyces boulardii  250 mg Oral BID  . senna-docusate  1 tablet Oral QHS  . sodium chloride  3 mL Intravenous Q8H  . spironolactone  25 mg Oral Daily   Continuous Infusions:   Active Problems:   CORONARY ARTERY DISEASE, VESSEL TYPE UNSPECIFIED   Atherosclerosis of native arteries of the extremities with ulceration(440.23)   Septic shock   Acute respiratory failure   Pneumonia   C. difficile colitis   AKI (acute kidney injury)   Dry gangrene   Right ventricular dysfunction   Cardiomyopathy, ischemic   Shock circulatory   Hypervolemia    RVF (right ventricular failure)   Acute on chronic systolic heart failure   Right heart failure   Hypotension arterial   UTI (urinary tract infection)   Hypotension, unspecified   DNR (do not resuscitate)  Time spent: 61min  Gizel Riedlinger  K  [facility] Pager [phone]. If 7PM-7AM, please contact night-coverage at [email or web address], password [facility] 12/01/2013, 2:11 PM  LOS: 16 days

## 2013-12-02 DIAGNOSIS — I10 Essential (primary) hypertension: Secondary | ICD-10-CM

## 2013-12-02 LAB — GLUCOSE, CAPILLARY: GLUCOSE-CAPILLARY: 119 mg/dL — AB (ref 70–99)

## 2013-12-02 MED ORDER — DEXTROSE 5 % IV SOLN
1.5000 g | INTRAVENOUS | Status: DC
  Filled 2013-12-02: qty 1.5

## 2013-12-02 NOTE — Progress Notes (Signed)
[facility] PROGRESS NOTE  Fernando Lane AVW:[id number] DOB: April 15, 1950 DOA: 11/15/2013 PCP: Gwendolyn Grant, MD  Assessment/Plan: Septic shock  Completed abx for C diff - septic physiology resolved  Pseudomembranous colitis  PCR positive 8/31 - 10 days Fidaxomicin completed 9/09 UTI - Pseudomonas and Escherichia coli  UA c/w UTI - given ceftriaxone initially but speciation and sensitivities necessitate change to cefepime - high risk for C diff recurrence - monitor closely Dry gangrene of fourth right toe - Severe PAD  -Vascular Surgery plan is for R 4th toe amputation 9/21 -all large vessels were patent on recent arteriogram  Asymptomatyic hypotension  -Stable -Pt remains asymptomatic this AM Acute on chronic biventricular heart failure  -Cardiology cont to address diuresis/fluid balance - not LVAD candidate with severe RV failure and gangrene - EF approximately 35%, RV severely dilated - agree pt clearly preload depending - may not be able to tolerate both ACE and BB  Inoperable CAD  s/p out-of-hospital LAD infarct 5/15, patient also had likely RV infarction - LAD and RCA 100% occluded - Lcx 30% EF 20% -> medical management  Acute respiratory failure  resolved  OSA  Continue CPAP nightly per home routine  Polycythemia vera  Following Hgb - stable - followed by Dr. Juliann Mule ([facility]) - baseline ptl count ~800K - baseline WBC reportedly around 15  -Blood counts remain stable Chronic pain / severe debilitation  continue oxycodone IR - Palitiative Care following - long term prognosis guarded  Obesity - Body mass index is 31.43 kg/(m^2).  MRSA screen +  Code Status: DNR Family Communication: Pt in room Disposition Plan: Pending   Consultants:  Vascular surgery  Cardiology  Procedures:  Antibiotics: Vanc 9/03 >> 9/07  Pip-tazo 9/03 >> 9/07  Fidaxomicin 8/31 >> 9/09  Ceftriaxone 9/13 > 9/17  Cefepime 9/17 >   HPI/Subjective: No complaints. No acute events noted  overnight  Objective: Filed Vitals:   12/01/13 2033 12/01/13 2145 12/02/13 0500 12/02/13 1431  BP: 90/60  93/57 92/69  Pulse: 86 88 89 84  Temp: 97.9 F (36.6 C)  97.6 F (36.4 C) 97.5 F (36.4 C)  TempSrc: Oral  Oral Oral  Resp: 19 16 17 18   Height:      Weight:   89 kg (196 lb 3.4 oz)   SpO2: 95% 96% 97% 97%    Intake/Output Summary (Last 24 hours) at 12/02/13 1635 Last data filed at 12/02/13 1022  Gross per 24 hour  Intake    753 ml  Output   1001 ml  Net   -248 ml   Filed Weights   11/30/13 1007 12/01/13 0630 12/02/13 0500  Weight: 88.3 kg (194 lb 10.7 oz) 89.7 kg (197 lb 12 oz) 89 kg (196 lb 3.4 oz)    Exam:   General:  Awake, in nad  Cardiovascular: regular, perfused  Respiratory: normal resp effort, no audible wheezing  Abdomen: soft, obese, nondistended  Musculoskeletal: no clubbing, necrotic R 4th digit  Data Reviewed: Basic Metabolic Panel:  Recent Labs Lab 11/26/13 0440 11/27/13 0530 11/28/13 0500 11/29/13 0430 11/30/13 0500 12/01/13 0346  NA 138 139 136* 137  --  134*  K 5.0 4.4 4.3 4.5  --  4.7  CL 90* 96 94* 95*  --  95*  CO2 39* 34* 34* 28  --  26  GLUCOSE 81 82 78 80  --  76  BUN 21 18 17 19   --  26*  CREATININE 0.87 0.87 0.84 0.90  --  1.19  CALCIUM 9.0 9.0 9.0 8.9  --  8.8  MG 2.2 2.0 2.0 2.0 1.8  --    Liver Function Tests: No results found for this basename: AST, ALT, ALKPHOS, BILITOT, PROT, ALBUMIN,  in the last 168 hours No results found for this basename: LIPASE, AMYLASE,  in the last 168 hours No results found for this basename: AMMONIA,  in the last 168 hours CBC:  Recent Labs Lab 11/27/13 0530 11/28/13 0500 11/29/13 0430 12/01/13 0346  WBC 16.5* 16.3* 15.4* 16.0*  HGB 14.1 14.2 14.4 14.1  HCT 45.6 45.9 46.5 44.9  MCV 90.8 91.4 90.8 90.0  PLT 505* 562* 573* 723*   Cardiac Enzymes: No results found for this basename: CKTOTAL, CKMB, CKMBINDEX, TROPONINI,  in the last 168 hours BNP (last 3 results)  Recent  Labs  08/07/13 1600 08/23/13 1759 11/15/13 1951  PROBNP 13375.0* 9383.0* 9486.0*   CBG:  Recent Labs Lab 11/30/13 2100 12/01/13 1122 12/01/13 1610 12/01/13 2133 12/02/13 1117  GLUCAP 140* 99 97 92 119*    Recent Results (from the past 240 hour(s))  URINE CULTURE     Status: None   Collection Time    11/25/13  4:30 PM      Result Value Ref Range Status   Specimen Description URINE, CATHETERIZED   Final   Special Requests NONE   Final   Culture  Setup Time     Final   Value: 11/25/2013 19:29     Performed at [facility]     Final   Value: >=100,000 COLONIES/ML     Performed at [facility]   Culture     Final   Value: PSEUDOMONAS AERUGINOSA     ESCHERICHIA COLI     Performed at [facility]   Report Status 11/30/2013 FINAL   Final   Organism ID, Bacteria PSEUDOMONAS AERUGINOSA   Final   Organism ID, Bacteria ESCHERICHIA COLI   Final     Studies: No results found.  Scheduled Meds: . aspirin  325 mg Oral Daily  . atorvastatin  80 mg Oral q1800  . bacitracin   Topical BID  . carvedilol  3.125 mg Oral BID WC  . ceFEPime (MAXIPIME) IV  1 g Intravenous 3 times per day  . [START ON 12/03/2013] cefUROXime (ZINACEF)  IV  1.5 g Intravenous On Call to OR  . enoxaparin (LOVENOX) injection  45 mg Subcutaneous Daily  . furosemide  60 mg Oral BID  . Gerhardt's butt cream   Topical BID  . Influenza vac split quadrivalent PF  0.5 mL Intramuscular Tomorrow-1000  . lisinopril  2.5 mg Oral QPM  . saccharomyces boulardii  250 mg Oral BID  . senna-docusate  1 tablet Oral QHS  . sodium chloride  3 mL Intravenous Q8H  . spironolactone  25 mg Oral Daily   Continuous Infusions:   Active Problems:   CORONARY ARTERY DISEASE, VESSEL TYPE UNSPECIFIED   Atherosclerosis of native arteries of the extremities with ulceration(440.23)   Septic shock   Acute respiratory failure   Pneumonia   C. difficile colitis   AKI (acute kidney injury)   Dry  gangrene   Right ventricular dysfunction   Cardiomyopathy, ischemic   Shock circulatory   Hypervolemia   RVF (right ventricular failure)   Acute on chronic systolic heart failure   Right heart failure   Hypotension arterial   UTI (urinary tract infection)   Hypotension, unspecified   DNR (  do not resuscitate)  Time spent: 23min  Carlin Attridge, [facility] Pager [phone]. If 7PM-7AM, please contact night-coverage at [email or web address], password [facility] 12/02/2013, 4:35 PM  LOS: 17 days

## 2013-12-03 ENCOUNTER — Encounter (HOSPITAL_COMMUNITY)
Admission: EM | Disposition: A | Discharge status: Skilled Nursing Facility | Payer: Self-pay | Attending: Internal Medicine

## 2013-12-03 ENCOUNTER — Inpatient Hospital Stay (HOSPITAL_COMMUNITY): Payer: Medicare Other | Admitting: Anesthesiology

## 2013-12-03 ENCOUNTER — Encounter (HOSPITAL_COMMUNITY): Payer: Self-pay | Admitting: Anesthesiology

## 2013-12-03 ENCOUNTER — Encounter (HOSPITAL_COMMUNITY): Payer: Medicare Other | Admitting: Anesthesiology

## 2013-12-03 DIAGNOSIS — R579 Shock, unspecified: Secondary | ICD-10-CM

## 2013-12-03 DIAGNOSIS — I70269 Atherosclerosis of native arteries of extremities with gangrene, unspecified extremity: Secondary | ICD-10-CM

## 2013-12-03 HISTORY — PX: AMPUTATION: SHX166

## 2013-12-03 LAB — BASIC METABOLIC PANEL
Anion gap: 13 (ref 5–15)
BUN: 29 mg/dL — ABNORMAL HIGH (ref 6–23)
CO2: 25 meq/L (ref 19–32)
Calcium: 8.8 mg/dL (ref 8.4–10.5)
Chloride: 97 mEq/L (ref 96–112)
Creatinine, Ser: 1.05 mg/dL (ref 0.50–1.35)
GFR calc Af Amer: 85 mL/min — ABNORMAL LOW (ref >90)
GFR calc non Af Amer: 73 mL/min — ABNORMAL LOW (ref >90)
GLUCOSE: 87 mg/dL (ref 70–99)
POTASSIUM: 4.7 meq/L (ref 3.7–5.3)
SODIUM: 135 meq/L — AB (ref 137–147)

## 2013-12-03 LAB — CBC
HCT: 45.3 % (ref 39.0–52.0)
HEMOGLOBIN: 14.5 g/dL (ref 13.0–17.0)
MCH: 28.5 pg (ref 26.0–34.0)
MCHC: 32 g/dL (ref 30.0–36.0)
MCV: 89 fL (ref 78.0–100.0)
Platelets: 689 10*3/uL — ABNORMAL HIGH (ref 150–400)
RBC: 5.09 MIL/uL (ref 4.22–5.81)
RDW: 18.1 % — ABNORMAL HIGH (ref 11.5–15.5)
WBC: 17.3 10*3/uL — ABNORMAL HIGH (ref 4.0–10.5)

## 2013-12-03 LAB — SURGICAL PCR SCREEN
MRSA, PCR: POSITIVE — AB
STAPHYLOCOCCUS AUREUS: POSITIVE — AB

## 2013-12-03 SURGERY — AMPUTATION DIGIT
Anesthesia: General | Site: Foot | Laterality: Right

## 2013-12-03 MED ORDER — LACTATED RINGERS IV SOLN
INTRAVENOUS | Status: DC | PRN
  Administered 2013-12-03: 13:00:00 via INTRAVENOUS

## 2013-12-03 MED ORDER — PHENYLEPHRINE 40 MCG/ML (10ML) SYRINGE FOR IV PUSH (FOR BLOOD PRESSURE SUPPORT)
PREFILLED_SYRINGE | INTRAVENOUS | Status: AC
  Filled 2013-12-03: qty 10

## 2013-12-03 MED ORDER — ARTIFICIAL TEARS OP OINT
TOPICAL_OINTMENT | OPHTHALMIC | Status: DC | PRN
  Administered 2013-12-03: 1 via OPHTHALMIC

## 2013-12-03 MED ORDER — PROPOFOL 10 MG/ML IV BOLUS
INTRAVENOUS | Status: AC
  Filled 2013-12-03: qty 20

## 2013-12-03 MED ORDER — PROPOFOL 10 MG/ML IV BOLUS
INTRAVENOUS | Status: DC | PRN
  Administered 2013-12-03: 130 mg via INTRAVENOUS

## 2013-12-03 MED ORDER — LIDOCAINE HCL (CARDIAC) 20 MG/ML IV SOLN
INTRAVENOUS | Status: DC | PRN
  Administered 2013-12-03: 80 mg via INTRAVENOUS

## 2013-12-03 MED ORDER — OXYCODONE HCL 5 MG PO TABS: 5.0000 mg | ORAL_TABLET | Freq: Once | ORAL | Status: DC | PRN

## 2013-12-03 MED ORDER — PHENYLEPHRINE HCL 10 MG/ML IJ SOLN
10.0000 mg | INTRAVENOUS | Status: DC | PRN
  Administered 2013-12-03: 50 ug/min via INTRAVENOUS

## 2013-12-03 MED ORDER — FENTANYL CITRATE 0.05 MG/ML IJ SOLN
INTRAMUSCULAR | Status: DC | PRN
  Administered 2013-12-03 (×2): 50 ug via INTRAVENOUS

## 2013-12-03 MED ORDER — PHENYLEPHRINE HCL 10 MG/ML IJ SOLN
INTRAMUSCULAR | Status: DC | PRN
  Administered 2013-12-03 (×2): 80 ug via INTRAVENOUS
  Administered 2013-12-03 (×2): 120 ug via INTRAVENOUS

## 2013-12-03 MED ORDER — MUPIROCIN 2 % EX OINT
1.0000 "application " | TOPICAL_OINTMENT | Freq: Two times a day (BID) | CUTANEOUS | Status: DC
  Administered 2013-12-03 – 2013-12-04 (×3): 1 via NASAL
  Filled 2013-12-03: qty 22

## 2013-12-03 MED ORDER — ONDANSETRON HCL 4 MG/2ML IJ SOLN
INTRAMUSCULAR | Status: DC | PRN
  Administered 2013-12-03: 4 mg via INTRAVENOUS

## 2013-12-03 MED ORDER — FENTANYL CITRATE 0.05 MG/ML IJ SOLN: 25.0000 ug | INTRAMUSCULAR | Status: DC | PRN

## 2013-12-03 MED ORDER — OXYCODONE HCL 5 MG/5ML PO SOLN: 5.0000 mg | Freq: Once | ORAL | Status: DC | PRN

## 2013-12-03 MED ORDER — FENTANYL CITRATE 0.05 MG/ML IJ SOLN
INTRAMUSCULAR | Status: AC
  Filled 2013-12-03: qty 5

## 2013-12-03 MED ORDER — 0.9 % SODIUM CHLORIDE (POUR BTL) OPTIME
TOPICAL | Status: DC | PRN
  Administered 2013-12-03: 1000 mL

## 2013-12-03 MED ORDER — ONDANSETRON HCL 4 MG/2ML IJ SOLN: 4.0000 mg | Freq: Four times a day (QID) | INTRAMUSCULAR | Status: DC | PRN

## 2013-12-03 MED ORDER — FENTANYL CITRATE 0.05 MG/ML IJ SOLN
INTRAMUSCULAR | Status: AC
  Filled 2013-12-03: qty 2

## 2013-12-03 MED ORDER — CHLORHEXIDINE GLUCONATE CLOTH 2 % EX PADS
6.0000 | MEDICATED_PAD | Freq: Every day | CUTANEOUS | Status: DC
  Administered 2013-12-03: 6 via TOPICAL

## 2013-12-03 SURGICAL SUPPLY — 32 items
BANDAGE ELASTIC 4 VELCRO ST LF (GAUZE/BANDAGES/DRESSINGS) ×3 IMPLANT
BNDG CONFORM 3 STRL LF (GAUZE/BANDAGES/DRESSINGS) ×3 IMPLANT
BNDG GAUZE ELAST 4 BULKY (GAUZE/BANDAGES/DRESSINGS) ×3 IMPLANT
CANISTER SUCTION 2500CC (MISCELLANEOUS) ×3 IMPLANT
COVER SURGICAL LIGHT HANDLE (MISCELLANEOUS) ×3 IMPLANT
DRAPE EXTREMITY T 121X128X90 (DRAPE) ×3 IMPLANT
DRSG EMULSION OIL 3X3 NADH (GAUZE/BANDAGES/DRESSINGS) ×3 IMPLANT
ELECT REM PT RETURN 9FT ADLT (ELECTROSURGICAL) ×3
ELECTRODE REM PT RTRN 9FT ADLT (ELECTROSURGICAL) ×1 IMPLANT
GAUZE SPONGE 4X4 12PLY STRL (GAUZE/BANDAGES/DRESSINGS) ×3 IMPLANT
GLOVE BIO SURGEON STRL SZ 6.5 (GLOVE) ×1 IMPLANT
GLOVE BIO SURGEON STRL SZ7 (GLOVE) ×3 IMPLANT
GLOVE BIO SURGEONS STRL SZ 6.5 (GLOVE) ×1
GLOVE BIOGEL PI IND STRL 6.5 (GLOVE) IMPLANT
GLOVE BIOGEL PI IND STRL 7.5 (GLOVE) ×1 IMPLANT
GLOVE BIOGEL PI INDICATOR 6.5 (GLOVE) ×2
GLOVE BIOGEL PI INDICATOR 7.5 (GLOVE) ×2
GOWN STRL REUS W/ TWL LRG LVL3 (GOWN DISPOSABLE) ×3 IMPLANT
GOWN STRL REUS W/TWL LRG LVL3 (GOWN DISPOSABLE) ×9
KIT BASIN OR (CUSTOM PROCEDURE TRAY) ×3 IMPLANT
KIT ROOM TURNOVER OR (KITS) ×3 IMPLANT
NS IRRIG 1000ML POUR BTL (IV SOLUTION) ×3 IMPLANT
PACK GENERAL/GYN (CUSTOM PROCEDURE TRAY) ×3 IMPLANT
PAD ARMBOARD 7.5X6 YLW CONV (MISCELLANEOUS) ×6 IMPLANT
SPECIMEN JAR SMALL (MISCELLANEOUS) ×3 IMPLANT
SPONGE GAUZE 4X4 12PLY STER LF (GAUZE/BANDAGES/DRESSINGS) ×2 IMPLANT
SUT ETHILON 3 0 PS 1 (SUTURE) ×5 IMPLANT
TOWEL OR 17X24 6PK STRL BLUE (TOWEL DISPOSABLE) ×3 IMPLANT
TOWEL OR 17X26 10 PK STRL BLUE (TOWEL DISPOSABLE) ×3 IMPLANT
TUBE ANAEROBIC SPECIMEN COL (MISCELLANEOUS) IMPLANT
UNDERPAD 30X30 INCONTINENT (UNDERPADS AND DIAPERS) ×3 IMPLANT
WATER STERILE IRR 1000ML POUR (IV SOLUTION) ×3 IMPLANT

## 2013-12-03 NOTE — Progress Notes (Signed)
UR completed Runell Kovich K. Ashton Belote, RN, BSN, MSHL, CCM  12/03/2013 5:15 PM

## 2013-12-03 NOTE — Progress Notes (Signed)
[facility] PROGRESS NOTE  Fernando Lane TLX:[id number] DOB: 06/22/1950 DOA: 11/15/2013 PCP: Gwendolyn Grant, MD  Assessment/Plan: Septic shock  Completed abx for C diff - septic physiology resolved  Pseudomembranous colitis  PCR positive 8/31 - 10 days Fidaxomicin completed 9/09 UTI - Pseudomonas and Escherichia coli  UA c/w UTI - given ceftriaxone initially but speciation and sensitivities necessitate change to cefepime - high risk for C diff recurrence - monitor closely Dry gangrene of fourth right toe - Severe PAD  -Vascular Surgery plan is for R 4th toe amputation -For surgery today -all large vessels were patent on recent arteriogram  Asymptomatyic hypotension  -Stable -Pt remains asymptomatic this AM Acute on chronic biventricular heart failure  -Cardiology cont to address diuresis/fluid balance - not LVAD candidate with severe RV failure and gangrene - EF approximately 35%, RV severely dilated - agree pt clearly preload depending - may not be able to tolerate both ACE and BB  Inoperable CAD  s/p out-of-hospital LAD infarct 5/15, patient also had likely RV infarction - LAD and RCA 100% occluded - Lcx 30% EF 20% -> medical management  Acute respiratory failure  resolved  OSA  Continue CPAP nightly per home routine  Polycythemia vera  Following Hgb - stable - followed by Dr. Juliann Mule ([facility]) - baseline ptl count ~800K - baseline WBC reportedly around 15  -Blood counts remain stable Chronic pain / severe debilitation  continue oxycodone IR - Palitiative Care following - long term prognosis guarded  Obesity - Body mass index is 31.43 kg/(m^2).  MRSA screen +  Code Status: DNR Family Communication: Pt in room Disposition Plan: Pending   Consultants:  Vascular surgery  Cardiology  Procedures:  Antibiotics: Vanc 9/03 >> 9/07  Pip-tazo 9/03 >> 9/07  Fidaxomicin 8/31 >> 9/09  Ceftriaxone 9/13 > 9/17  Cefepime 9/17 >   HPI/Subjective: Pt without acute events  overnight. Eager to have surgery  Objective: Filed Vitals:   12/02/13 1431 12/02/13 2259 12/03/13 0727 12/03/13 0841  BP: 92/69 98/86 99/59  108/63  Pulse: 84 88 80 79  Temp: 97.5 F (36.4 C) 97.8 F (36.6 C) 97.6 F (36.4 C)   TempSrc: Oral Oral Oral   Resp: 18 20 20    Height:      Weight:   88.96 kg (196 lb 1.9 oz)   SpO2: 97% 98% 100%     Intake/Output Summary (Last 24 hours) at 12/03/13 1423 Last data filed at 12/03/13 1105  Gross per 24 hour  Intake    393 ml  Output    100 ml  Net    293 ml   Filed Weights   12/01/13 0630 12/02/13 0500 12/03/13 0727  Weight: 89.7 kg (197 lb 12 oz) 89 kg (196 lb 3.4 oz) 88.96 kg (196 lb 1.9 oz)    Exam:   General:  Awake, in nad  Cardiovascular: regular, perfused  Respiratory: normal resp effort, no audible wheezing  Abdomen: soft, obese, nondistended  Musculoskeletal: no clubbing, necrotic R 4th digit  Data Reviewed: Basic Metabolic Panel:  Recent Labs Lab 11/27/13 0530 11/28/13 0500 11/29/13 0430 11/30/13 0500 12/01/13 0346 12/03/13 0558  NA 139 136* 137  --  134* 135*  K 4.4 4.3 4.5  --  4.7 4.7  CL 96 94* 95*  --  95* 97  CO2 34* 34* 28  --  26 25  GLUCOSE 82 78 80  --  76 87  BUN 18 17 19   --  26* 29*  CREATININE 0.87  0.84 0.90  --  1.19 1.05  CALCIUM 9.0 9.0 8.9  --  8.8 8.8  MG 2.0 2.0 2.0 1.8  --   --    Liver Function Tests: No results found for this basename: AST, ALT, ALKPHOS, BILITOT, PROT, ALBUMIN,  in the last 168 hours No results found for this basename: LIPASE, AMYLASE,  in the last 168 hours No results found for this basename: AMMONIA,  in the last 168 hours CBC:  Recent Labs Lab 11/27/13 0530 11/28/13 0500 11/29/13 0430 12/01/13 0346 12/03/13 0558  WBC 16.5* 16.3* 15.4* 16.0* 17.3*  HGB 14.1 14.2 14.4 14.1 14.5  HCT 45.6 45.9 46.5 44.9 45.3  MCV 90.8 91.4 90.8 90.0 89.0  PLT 505* 562* 573* 723* 689*   Cardiac Enzymes: No results found for this basename: CKTOTAL, CKMB, CKMBINDEX,  TROPONINI,  in the last 168 hours BNP (last 3 results)  Recent Labs  08/07/13 1600 08/23/13 1759 11/15/13 1951  PROBNP 13375.0* 9383.0* 9486.0*   CBG:  Recent Labs Lab 11/30/13 2100 12/01/13 1122 12/01/13 1610 12/01/13 2133 12/02/13 1117  GLUCAP 140* 99 97 92 119*    Recent Results (from the past 240 hour(s))  URINE CULTURE     Status: None   Collection Time    11/25/13  4:30 PM      Result Value Ref Range Status   Specimen Description URINE, CATHETERIZED   Final   Special Requests NONE   Final   Culture  Setup Time     Final   Value: 11/25/2013 19:29     Performed at [facility]     Final   Value: >=100,000 COLONIES/ML     Performed at [facility]   Culture     Final   Value: PSEUDOMONAS AERUGINOSA     ESCHERICHIA COLI     Performed at [facility]   Report Status 11/30/2013 FINAL   Final   Organism ID, Bacteria PSEUDOMONAS AERUGINOSA   Final   Organism ID, Bacteria ESCHERICHIA COLI   Final  SURGICAL PCR SCREEN     Status: Abnormal   Collection Time    12/03/13  6:30 AM      Result Value Ref Range Status   MRSA, PCR POSITIVE (*) NEGATIVE Final   Comment: RESULT CALLED TO, READ BACK BY AND VERIFIED WITH:     L.MUELLER,RN 12/03/13 0849 BY BSLADE   Staphylococcus aureus POSITIVE (*) NEGATIVE Final   Comment:            The Xpert SA Assay (FDA     approved for NASAL specimens     in patients over 20 years of age),     is one component of     a comprehensive surveillance     program.  Test performance has     been validated by [employer] for patients greater     than or equal to 80 year old.     It is not intended     to diagnose infection nor to     guide or monitor treatment.     Studies: No results found.  Scheduled Meds: . [facility] HOLD] aspirin  325 mg Oral Daily  . [facility] HOLD] atorvastatin  80 mg Oral q1800  . [MAR HOLD] bacitracin   Topical BID  . [facility] HOLD] carvedilol  3.125 mg Oral BID WC  . [MAR  HOLD] ceFEPime (MAXIPIME) IV  1 g Intravenous 3  times per day  . cefUROXime (ZINACEF)  IV  1.5 g Intravenous On Call to OR  . [MAR HOLD] Chlorhexidine Gluconate Cloth  6 each Topical Q0600  . [MAR HOLD] enoxaparin (LOVENOX) injection  45 mg Subcutaneous Daily  . [facility] HOLD] furosemide  60 mg Oral BID  . [facility] HOLD] Gerhardt's butt cream   Topical BID  . [MAR HOLD] Influenza vac split quadrivalent PF  0.5 mL Intramuscular Tomorrow-1000  . [MAR HOLD] lisinopril  2.5 mg Oral QPM  . [MAR HOLD] mupirocin ointment  1 application Nasal BID  . [MAR HOLD] saccharomyces boulardii  250 mg Oral BID  . [MAR HOLD] senna-docusate  1 tablet Oral QHS  . [MAR HOLD] sodium chloride  3 mL Intravenous Q8H  . [facility] HOLD] spironolactone  25 mg Oral Daily   Continuous Infusions:   Active Problems:   CORONARY ARTERY DISEASE, VESSEL TYPE UNSPECIFIED   Atherosclerosis of native arteries of the extremities with ulceration(440.23)   Septic shock   Acute respiratory failure   Pneumonia   C. difficile colitis   AKI (acute kidney injury)   Dry gangrene   Right ventricular dysfunction   Cardiomyopathy, ischemic   Shock circulatory   Hypervolemia   RVF (right ventricular failure)   Acute on chronic systolic heart failure   Right heart failure   Hypotension arterial   UTI (urinary tract infection)   Hypotension, unspecified   DNR (do not resuscitate)  Time spent: 32min  Mena Lienau, [place] [facility] Pager [phone]. If 7PM-7AM, please contact night-coverage at [email or web address], password [facility] 12/03/2013, 2:23 PM  LOS: 18 days

## 2013-12-03 NOTE — Anesthesia Procedure Notes (Addendum)
Procedure Name: LMA Insertion Date/Time: 12/03/2013 2:28 PM Performed by: Scheryl Darter Pre-anesthesia Checklist: Patient identified, Emergency Drugs available, Suction available, Patient being monitored and Timeout performed Patient Re-evaluated:Patient Re-evaluated prior to inductionOxygen Delivery Method: Circle system utilized Preoxygenation: Pre-oxygenation with 100% oxygen Intubation Type: IV induction Ventilation: Mask ventilation without difficulty LMA: LMA inserted LMA Size: 4.0 Number of attempts: 1 Placement Confirmation: positive ETCO2 and breath sounds checked- equal and bilateral Tube secured with: Tape Dental Injury: Teeth and Oropharynx as per pre-operative assessment

## 2013-12-03 NOTE — Progress Notes (Signed)
Patient ID: Fernando Lane, male   DOB: 09-28-1950, 63 y.o.   MRN: [id number] ADVANCED HEART FAILURE ROUNDING NOTE DAILY PROGRESS NOTE  Subjective:  Stable this morning.  Denies SOB/CP.  On cefepime for UTI.  Has been getting out of bed to chair.  Plan for OR this morning for toe amputation.   Patient now DNR.   Objective:  Temp:  [97.5 F (36.4 C)-97.8 F (36.6 C)] 97.6 F (36.4 C) (09/21 0727) Pulse Rate:  [79-88] 79 (09/21 0841) Resp:  [18-20] 20 (09/21 0727) BP: (92-108)/(59-86) 108/63 mmHg (09/21 0841) SpO2:  [97 %-100 %] 100 % (09/21 0727) Weight:  [196 lb 1.9 oz (88.96 kg)] 196 lb 1.9 oz (88.96 kg) (09/21 0727) Weight change:   Intake/Output from previous day: 09/20 0701 - 09/21 0700 In: 633 [P.O.:480; I.V.:3; IV Piggyback:150] Out: 101 [Urine:100; Stool:1]  Intake/Output from this shift:    Scheduled Meds: . aspirin  325 mg Oral Daily  . atorvastatin  80 mg Oral q1800  . bacitracin   Topical BID  . carvedilol  3.125 mg Oral BID WC  . ceFEPime (MAXIPIME) IV  1 g Intravenous 3 times per day  . cefUROXime (ZINACEF)  IV  1.5 g Intravenous On Call to OR  . Chlorhexidine Gluconate Cloth  6 each Topical Q0600  . enoxaparin (LOVENOX) injection  45 mg Subcutaneous Daily  . furosemide  60 mg Oral BID  . Gerhardt's butt cream   Topical BID  . Influenza vac split quadrivalent PF  0.5 mL Intramuscular Tomorrow-1000  . lisinopril  2.5 mg Oral QPM  . mupirocin ointment  1 application Nasal BID  . saccharomyces boulardii  250 mg Oral BID  . senna-docusate  1 tablet Oral QHS  . sodium chloride  3 mL Intravenous Q8H  . spironolactone  25 mg Oral Daily   Continuous Infusions:  PRN Meds:.albuterol, bisacodyl, docusate sodium, fentaNYL, oxyCODONE  Physical Exam: General:  Chronically ill-appearing. No resp difficulty HEENT: normal Neck: supple. JVP 8 cm. Carotids 2+ bilat; no bruits. RIJ CVL Cor: RRR distant Lungs: clear anteriorly Abdomen: markedly obese mildly distended.   Good bowel sounds. Extremities: no cyanosis, clubbing, rash.  No edema. decubitus ulcers covered. Gangrenous toe R foot   Neuro: alert & orientedx3, cranial nerves grossly intact. moves all 4 extremities w/o difficulty. Affect pleasant   Lab Results: Results for orders placed during the hospital encounter of 11/15/13 (from the past 48 hour(s))  GLUCOSE, CAPILLARY     Status: None   Collection Time    12/01/13 11:22 AM      Result Value Ref Range   Glucose-Capillary 99  70 - 99 mg/dL   Comment 1 Documented in Chart     Comment 2 Notify RN    GLUCOSE, CAPILLARY     Status: None   Collection Time    12/01/13  4:10 PM      Result Value Ref Range   Glucose-Capillary 97  70 - 99 mg/dL  GLUCOSE, CAPILLARY     Status: None   Collection Time    12/01/13  9:33 PM      Result Value Ref Range   Glucose-Capillary 92  70 - 99 mg/dL  GLUCOSE, CAPILLARY     Status: Abnormal   Collection Time    12/02/13 11:17 AM      Result Value Ref Range   Glucose-Capillary 119 (*) 70 - 99 mg/dL   Comment 1 Notify RN    CBC  Status: Abnormal   Collection Time    12/03/13  5:58 AM      Result Value Ref Range   WBC 17.3 (*) 4.0 - 10.5 K/uL   Comment: WHITE COUNT CONFIRMED ON SMEAR   RBC 5.09  4.22 - 5.81 MIL/uL   Hemoglobin 14.5  13.0 - 17.0 g/dL   HCT 45.3  39.0 - 52.0 %   MCV 89.0  78.0 - 100.0 fL   MCH 28.5  26.0 - 34.0 pg   MCHC 32.0  30.0 - 36.0 g/dL   RDW 18.1 (*) 11.5 - 15.5 %   Platelets 689 (*) 150 - 400 K/uL   Comment: PLATELET COUNT CONFIRMED BY SMEAR  BASIC METABOLIC PANEL     Status: Abnormal   Collection Time    12/03/13  5:58 AM      Result Value Ref Range   Sodium 135 (*) 137 - 147 mEq/L   Potassium 4.7  3.7 - 5.3 mEq/L   Chloride 97  96 - 112 mEq/L   CO2 25  19 - 32 mEq/L   Glucose, Bld 87  70 - 99 mg/dL   BUN 29 (*) 6 - 23 mg/dL   Creatinine, Ser 1.05  0.50 - 1.35 mg/dL   Calcium 8.8  8.4 - 10.5 mg/dL   GFR calc non Af Amer 73 (*) >90 mL/min   GFR calc Af Amer 85 (*)  >90 mL/min   Comment: (NOTE)     The eGFR has been calculated using the CKD EPI equation.     This calculation has not been validated in all clinical situations.     eGFR's persistently <90 mL/min signify possible Chronic Kidney     Disease.   Anion gap 13  5 - 15  SURGICAL PCR SCREEN     Status: Abnormal   Collection Time    12/03/13  6:30 AM      Result Value Ref Range   MRSA, PCR POSITIVE (*) NEGATIVE   Comment: RESULT CALLED TO, READ BACK BY AND VERIFIED WITH:     L.MUELLER,RN 12/03/13 0849 BY BSLADE   Staphylococcus aureus POSITIVE (*) NEGATIVE   Comment:            The Xpert SA Assay (FDA     approved for NASAL specimens     in patients over 61 years of age),     is one component of     a comprehensive surveillance     program.  Test performance has     been validated by [employer] for patients greater     than or equal to 34 year old.     It is not intended     to diagnose infection nor to     guide or monitor treatment.   Assessment: 1. 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  Not LVAD candidate with severe RV failure and gangrene.      --EF approximately 35%, RV severely dilated. Difficult study.  2. Anasarca 3. CAD s/p out-of -hospital LAD infarct 5/15, patient also had likely RV infarction.      --LAD and RCA 100% occluded. Lcx 30% EF 20% -> medical management 4. 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 PAD, severe.  Dry gangrene right foot.  6. Morbid obesity 7. Acute on chronic respiratory failure 8. Severe decubiti 9. C difficile colitis: He has completed treatment.   10. Acute kidney injury, improved 11.  Protein-calorie malnutrition 12. RUE Edema.  13. Hypomagnesemia 14. Hypotension 15. UTI: E coli and Pseudomonas grew  Plan/discussion:  BP stable. Continue current Lasix.  BP soft but tolerating low doses of Coreg and lisinopril.  Hold only for SBP </=85.   Cefepime for UTI  with growth of Pseudomonas and E coli.    Plan for right toe amputation today.    Needs ongoing PT, should be ready for rehab soon.   Macdonald Rigor,MD 10:11 AM 12/03/2013

## 2013-12-03 NOTE — Progress Notes (Signed)
CRITICAL VALUE ALERT  Critical value received: MRSA PCR positive  Date of notification:  12/03/13  Time of notification:  0850  Critical value read back:Yes.    Nurse who received alert:  Waynetta Sandy, RN  MD notified (1st page):    Time of first page:    MD notified (2nd page):  Time of second page:  Responding MD:    Time MD responded:

## 2013-12-03 NOTE — H&P (View-Only) (Signed)
Hospital Consult    Reason for Consult:  Gangrene right foot MRN #:  [id number]  History of Present Illness: This is a 63 y.o. male who is known to Dr. Oneida Alar for gangrenous changes to the toes on the right foot.  He underwent arteriogram on Aug 08, 2013, which revealed unreconstructable tibial artery occlusive disease.  At that time, he did have pain in his foot, but he was tolerating it.  He is minimally ambulatory.  On October 11, 2013, he was offered a right BKA versus observation.  Dr. Oneida Alar discussed with the pt that if he had evidence of ascending worsening infection or unrelenting pain from the right foot, he would need an amputation.    Pt has hx of polycythemia, sleep apnea, CAD and pericarditis.  His ejection fraction was 20%.  He did have an echo today, but these results are not recorded yet.  He presented to [facility] yesterday with septic shock and acute respiratory failure requiring intubation and transferred to [facility] for further management.  He has hx of STEMI with medical management  He did have a positive troponin I this am of 2.63, which is elevated from last pm.  His BNP is 9400.  He does have AKI with the creatinine up significantly to 3.13 today and it was 0.80 in June of this year.  His WBC is 44k and he is on vanc/Zosyn and Metronidazole for + C diff.  Urine and Blood cx are pending.  He has been afebrile since admission.  The pt is on Plavix and aspirin.    Past Medical History  Diagnosis Date  . Chronic low back pain   . 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 Chronic neck pain   . OSA (obstructive sleep apnea)     CPAP @ bedtime  . Allergy   . Unspecified essential hypertension     Resistant, 2D Echo - EF-55-60  . Polycythemia   . CAD (coronary artery disease)     Vessel type unspecified  . PVD (peripheral vascular disease)   . DDD (degenerative disc disease), cervical    Past Surgical History    Procedure Laterality Date  . Lower extrmity doppler      [place] 2008; no evidence for PAD  . US echocardiography  11/2008    Normal valves, mild LAE  . Left knee replacement  10/2006  . Cervical spine surgery  02/2008  . Joint replacement    . Spine surgery      No Known Allergies  Prior to Admission medications   Medication Sig Start Date End Date Taking? Authorizing Provider  anagrelide (AGRYLIN) 0.5 MG capsule Take 1 capsule (0.5 mg total) by mouth 2 (two) times daily. 08/30/13   Domenic Polite, MD  aspirin 81 MG chewable tablet Chew 1 tablet (81 mg total) by mouth daily. 08/17/13   Geradine Girt, DO  atorvastatin (LIPITOR) 80 MG tablet Take 1 tablet (80 mg total) by mouth daily at 6 PM. 08/17/13   Geradine Girt, DO  carvedilol (COREG) 12.5 MG tablet Take 1 tablet (12.5 mg total) by mouth 2 (two) times daily with a meal. 08/17/13   Geradine Girt, DO  chlorhexidine (PERIDEX) 0.12 % solution Use as directed 15 mLs in the mouth or throat daily.    Historical Provider, MD  chlorpheniramine-HYDROcodone (TUSSIONEX) 10-8 MG/5ML LQCR Take 5 mLs by mouth every 12 (twelve) hours as needed for cough. 08/17/13   Janett Billow  U Vann, DO  ciprofloxacin (CIPRO) 250 MG tablet Take 1 tablet (250 mg total) by mouth 2 (two) times daily. For 3 days 08/30/13   Domenic Polite, MD  clopidogrel (PLAVIX) 75 MG tablet Take 75 mg by mouth daily at 6 PM.    Historical Provider, MD  colchicine 0.6 MG tablet Take 0.6 mg by mouth daily at 6 PM.    Historical Provider, MD  feeding supplement, ENSURE COMPLETE, (ENSURE COMPLETE) LIQD Take 237 mLs by mouth 2 (two) times daily between meals. 08/17/13   Geradine Girt, DO  furosemide (LASIX) 20 MG tablet Take 1 tablet (20 mg total) by mouth 2 (two) times daily. 08/30/13   Domenic Polite, MD  gabapentin (NEURONTIN) 300 MG capsule Take 1 capsule (300 mg total) by mouth 3 (three) times daily. 02/21/13   Rowe Clack, MD  lisinopril (PRINIVIL,ZESTRIL) 5 MG tablet Take 1 tablet (5 mg total)  by mouth daily. 08/17/13   Geradine Girt, DO  oxyCODONE-acetaminophen (PERCOCET/ROXICET) 5-325 MG per tablet Take 1 tablet by mouth every 6 (six) hours as needed for severe pain. 08/30/13   Domenic Polite, MD  thiamine 100 MG tablet Take 1 tablet (100 mg total) by mouth daily. 08/17/13   Geradine Girt, DO    History   Social History  . Marital Status: Divorced    Spouse Name: N/A    Number of Children: 1  . Years of Education: N/A   Occupational History  .     Social History Main Topics  . Smoking status: Former Smoker -- 0 years    Types: Cigarettes, Pipe    Quit date: 03/15/1968  . Smokeless tobacco: Former Systems developer     Comment: Pt quit Cigarettes 1970's- and cigars & chew 1990  . Alcohol Use: 3.6 oz/week    6 Cans of beer per week  . Drug Use: No  . Sexual Activity: Not on file   Other Topics Concern  . Not on file   Social History Narrative   Divorced, lives alone. Daughter lives in town. Disable/Former management/construction     Family History  Problem Relation Age of Onset  . Heart attack Father     ROS: [x]  Positive   [ ]  Negative   [ ]  All sytems reviewed and are negative  Cardiovascular: []  chest pain/pressure []  palpitations []  SOB lying flat []  DOE []  pain in legs while walking []  pain in legs at rest []  pain in legs at night []  non-healing ulcers []  hx of DVT []  swelling in legs  Pulmonary: []  productive cough []  asthma/wheezing []  home O2 [x]  OSA-uses CPAP  Neurologic: []  weakness in []  arms []  legs []  numbness in []  arms []  legs []  hx of CVA []  mini stroke [] difficulty speaking or slurred speech []  temporary loss of vision in one eye []  dizziness  Hematologic: []  hx of cancer []  bleeding problems []  problems with blood clotting easily [x]  polycythemia  Endocrine:   []  diabetes []  thyroid disease  GI []  vomiting blood []  blood in stool  GU: []  CKD/renal failure []  HD--[]  M/W/F or []  T/T/S [x]  AKI []  burning with urination []   blood in urine  Psychiatric: []  anxiety []  depression  Musculoskeletal: []  arthritis []  joint pain [x]  DDD-cervical  Integumentary: []  rashes [x]  gangrene right foot  Constitutional: []  fever []  chills   Physical Examination  Filed Vitals:   11/16/13 1224  BP:   Pulse:   Temp: 97.8 F (36.6 C)  Resp:  Body mass index is 40.16 kg/(m^2).  General:  WDWN in NAD Gait: Not observed HENT: WNL, normocephalic Eyes: Pupils equal Pulmonary: normal non-labored breathing, without Rales, rhonchi,  wheezing Cardiac: regular, without  Murmurs, rubs or gallops; without carotid bruit on the left (he has a triple lumen catheter right neck) Abdomen: soft, NT/ND, no masses Skin: without rashes, with ulcers  Vascular Exam/Pulses:  Right Left  Radial 2+ (normal) 2+ (normal)  Ulnar Unable to palpate Unable to palpate  Femoral Pt has catheter in right groin therefore, unable to palpate Unable to palpate  Popliteal Unable to palpate Unable to palpate  DP Unable to palpate Unable to palpate  PT Unable to palpate Unable to palpate   Extremities: with ischemic changes to right foot, he does have a wound on the lateral aspect of right leg above the ankle;  Gangrenous toes right foot with Gangrene , without cellulitis; with open wounds; left foot and leg to mid calf has mild mottling Musculoskeletal: no muscle wasting or atrophy  Neurologic: A&O X 3; Appropriate Affect ; SENSATION: normal; MOTOR FUNCTION:  moving all extremities equally. Speech is fluent/normal Psychiatric:  Appropriate to make decisions Lymph:  No inguinal lymphadenopathy left groin   CBC    Component Value Date/Time   WBC 44.0* 11/16/2013 0500   WBC 12.9* 04/04/2013 1125   RBC 5.55 11/16/2013 0500   RBC 5.23 04/04/2013 1125   HGB 15.9 11/16/2013 0500   HGB 15.1 04/04/2013 1125   HCT 48.3 11/16/2013 0500   HCT 46.9 04/04/2013 1125   PLT 763* 11/16/2013 0500   PLT 459* 04/04/2013 1125   MCV 87.0 11/16/2013 0500   MCV 89.7  04/04/2013 1125   MCH 28.6 11/16/2013 0500   MCH 28.9 04/04/2013 1125   MCHC 32.9 11/16/2013 0500   MCHC 32.2 04/04/2013 1125   RDW 17.9* 11/16/2013 0500   RDW 16.1* 04/04/2013 1125   LYMPHSABS 0.9 11/15/2013 1950   LYMPHSABS 1.5 04/04/2013 1125   MONOABS 0.9 11/15/2013 1950   MONOABS 0.2 04/04/2013 1125   EOSABS 0.0 11/15/2013 1950   EOSABS 0.8* 04/04/2013 1125   BASOSABS 0.0 11/15/2013 1950   BASOSABS 0.0 04/04/2013 1125    BMET    Component Value Date/Time   NA 137 11/16/2013 0500   NA 136 03/02/2013 1055   K 3.8 11/16/2013 0500   K 4.2 03/02/2013 1055   CL 99 11/16/2013 0500   CL 108* 08/04/2012 1302   CO2 18* 11/16/2013 0500   CO2 22 03/02/2013 1055   GLUCOSE 182* 11/16/2013 0500   GLUCOSE 111 03/02/2013 1055   GLUCOSE 164* 08/04/2012 1302   BUN 55* 11/16/2013 0500   BUN 13.8 03/02/2013 1055   CREATININE 3.13* 11/16/2013 0500   CREATININE 0.7 03/02/2013 1055   CALCIUM 7.6* 11/16/2013 0500   CALCIUM 9.7 03/02/2013 1055   GFRNONAA 20* 11/16/2013 0500   GFRAA 23* 11/16/2013 0500    COAGS: Lab Results  Component Value Date   INR 1.59* 11/15/2013   INR 1.24 08/23/2013   INR 1.55* 08/08/2013     Non-Invasive Vascular Imaging:  None this admission  Statin:  Yes.   Beta Blocker:  Yes.   Aspirin:  Yes.   ACEI:  Yes.   ARB:  No. Other antiplatelets/anticoagulants:  Yes.  -Plavix   ASSESSMENT/PLAN: This is a 63 y.o. 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   -foot with gangrene, but doubt this is the source of his sepsis.  He is requiring pressor support.  Will allow pressor to be weaned off and proceed in a few days with right AKA vs. BKA.  I have also discontinued his Plavix today in anticipation for surgery.    -he does have mottling of the left foot/leg as well.  He does have severe tibial disease in the left leg as well.  Most likely being caused by pressors.  Hopeful this will improve once pressors are weaned. - pt is + for C diff and is on  Dificid -pt is septic with leukocytosis with WBC of 44k--pt is on Vanc/Zosyn--continue -pt with elevated troponin as well--will let pt recover more before proceeding to OR -continue care per primary team -will follow with you.   Leontine Locket, PA-C [facility] [phone]  History and exam as above.  Pt with prior arteriogram a few months ago which showed unreconstructable tibial disease.  He does have wound on the lateral aspect of his right foot and dusky toes.  I doubt this is the source of his current sepsis but his flow to his feet is compromised and probably worse secondary to vasopressin/levophed.  Pt is consenting to amputation of right leg at this point.  Will follow for now as his sepsis issues will need to be resolved prior to considering this.  Will follow as consult.  Ruta Hinds, MD [facility] [place] Office: [phone] Pager: [phone]

## 2013-12-03 NOTE — Anesthesia Preprocedure Evaluation (Addendum)
Anesthesia Evaluation  Patient identified by MRN, date of birth, ID band Patient awake    Reviewed: Allergy & Precautions, H&P , NPO status , Patient's Chart, lab work & pertinent test results  Airway Mallampati: II  Neck ROM: full    Dental  (+) Dental Advisory Given   Pulmonary sleep apnea , former smoker,    + decreased breath sounds      Cardiovascular hypertension, Pt. on medications and Pt. on home beta blockers + CAD, + Past MI and + Peripheral Vascular Disease Rhythm:Regular Rate:Normal     Neuro/Psych PSYCHIATRIC DISORDERS Depression negative neurological ROS     GI/Hepatic negative GI ROS, Neg liver ROS,   Endo/Other  Morbid obesityobese  Renal/GU      Musculoskeletal  (+) Arthritis -,   Abdominal   Peds  Hematology   Anesthesia Other Findings   Reproductive/Obstetrics                         Anesthesia Physical Anesthesia Plan  ASA: III  Anesthesia Plan: General   Post-op Pain Management:    Induction: Intravenous  Airway Management Planned: LMA  Additional Equipment:   Intra-op Plan:   Post-operative Plan:   Informed Consent: I have reviewed the patients History and Physical, chart, labs and discussed the procedure including the risks, benefits and alternatives for the proposed anesthesia with the patient or authorized representative who has indicated his/her understanding and acceptance.     Plan Discussed with: CRNA, Anesthesiologist and Surgeon  Anesthesia Plan Comments:         Anesthesia Quick Evaluation

## 2013-12-03 NOTE — Transfer of Care (Signed)
Immediate Anesthesia Transfer of Care Note  Patient: Fernando Lane  Procedure(s) Performed: Procedure(s): AMPUTATION RIGHT FOURTH TOE (Right)  Patient Location: PACU  Anesthesia Type:General  Level of Consciousness: awake, oriented and patient cooperative  Airway & Oxygen Therapy: Patient Spontanous Breathing and Patient connected to face mask oxygen  Post-op Assessment: Report given to PACU RN and Post -op Vital signs reviewed and stable  Post vital signs: Reviewed  Complications: No apparent anesthesia complications

## 2013-12-03 NOTE — Op Note (Signed)
    OPERATIVE NOTE   PROCEDURE: 1. Right fourth toe amputation  PRE-OPERATIVE DIAGNOSIS: Right fourth toe dry gangrene  POST-OPERATIVE DIAGNOSIS: same as above   SURGEON: Adele Barthel, MD  ANESTHESIA: general  ESTIMATED BLOOD LOSS: 30 cc  FINDING(S): 1. No bleeding from skin edges 2. No pus or necrotic tissue  3. Bone did not appear to be infected  SPECIMEN(S):  Right 4th toe  INDICATIONS:   Fernando Lane is a 63 y.o. male who presents with Right foot ischemia.  Dr. Oneida Alar had completed an angiogram which suggested he had no revascularization options.  This patient has been medically unstable until recently.  Dr. Oneida Alar had counseled the patient that a toe amputation might not heal but the patient and family elected to attempt the right 4th toe amputation.  The patient is aware risks include: bleeding, infection, myocardial infarction, stroke, and death, and inability to heal the amputation, and subsequent need for additional procedures including a more proximal amputation.  DESCRIPTION: After obtaining full informed written consent, the patient was brought back to the operating room and placed supine upon the operating table.  The patient received IV antibiotics prior to induction.  After obtaining adequate anesthesia, the patient was prepped and draped in the standard fashion for: right toe amputation.  I made a racquet incision around the right 4th toe.  Note, there was no bleeding from the skin edges.  I dissected down to the bone with electrocautery.  I tried to dissect out the proximal phalange of this bone but the bone shaft fractured.  I passed off this distal phalange with partial proximal phalange as the specimen.  I resected proximal phalange until I felt, I had adequate space to close this incision.  The subcutaneous tissue was reapproximated with interrupted 3-0 Vicryl stitches.  The skin was reapproximated with 3-0 Nylon stitches.  An adaptec was applied to the incision and  a sterile pressure dressing was applied to the incision.  The right foot was wrapped with a Kerlix and ACE bandage.  COMPLICATIONS: none  CONDITION: stable  Adele Barthel, MD [facility] [place] Office: [phone] Pager: [phone]  12/03/2013, 2:58 PM

## 2013-12-03 NOTE — Interval H&P Note (Signed)
History and Physical Interval Note:  12/03/2013 2:30 PM  Fernando Lane  has presented today for surgery, with the diagnosis of Atherosclerosis of native arteries of the extremities with gangrene  The various methods of treatment have been discussed with the patient and family. After consideration of risks, benefits and other options for treatment, the patient has consented to  Procedure(s): AMPUTATION RIGHT FOURTH TOE (Right) as a surgical intervention .  The patient's history has been reviewed, patient examined, no change in status, stable for surgery.  I have reviewed the patient's chart and labs.  Questions were answered to the patient's satisfaction.     CHEN,BRIAN LIANG-YU

## 2013-12-03 NOTE — Progress Notes (Signed)
CSW spoke with patient's son Artemis, [place] via telephone per his request to determine status of d/c plan.  Per prior CSW report- son was requesting placement at [facility] and this CSW has ascertained that a bed offer is in place with [facility]. CSW notified son of this and he stated that "maybe" he wanted [facility]..  [facility] will accept patient back as well. CSW will follow up with son tomorrow as patient underwent a toe amputation today.  Lorie Phenix. Pauline Good, [place]

## 2013-12-03 NOTE — Anesthesia Postprocedure Evaluation (Signed)
Anesthesia Post Note  Patient: Fernando Lane  Procedure(s) Performed: Procedure(s) (LRB): AMPUTATION RIGHT FOURTH TOE (Right)  Anesthesia type: General  Patient location: PACU  Post pain: Pain level controlled and Adequate analgesia  Post assessment: Post-op Vital signs reviewed, Patient's Cardiovascular Status Stable, Respiratory Function Stable, Patent Airway and Pain level controlled  Last Vitals:  Filed Vitals:   12/03/13 1530  BP: 90/61  Pulse: 86  Temp:   Resp: 16    Post vital signs: Reviewed and stable  Level of consciousness: awake, alert  and oriented  Complications: No apparent anesthesia complications

## 2013-12-04 ENCOUNTER — Telehealth: Payer: Self-pay | Admitting: Vascular Surgery

## 2013-12-04 ENCOUNTER — Encounter (HOSPITAL_COMMUNITY): Payer: Self-pay | Admitting: Vascular Surgery

## 2013-12-04 DIAGNOSIS — Z66 Do not resuscitate: Secondary | ICD-10-CM

## 2013-12-04 MED ORDER — MORPHINE SULFATE 2 MG/ML IJ SOLN
2.0000 mg | INTRAMUSCULAR | Status: DC | PRN
  Administered 2013-12-04 (×4): 2 mg via INTRAVENOUS
  Filled 2013-12-04 (×4): qty 1

## 2013-12-04 MED ORDER — SACCHAROMYCES BOULARDII 250 MG PO CAPS: 250.0000 mg | ORAL_CAPSULE | Freq: Two times a day (BID) | ORAL | Status: DC

## 2013-12-04 MED ORDER — CARVEDILOL 3.125 MG PO TABS: 3.1250 mg | ORAL_TABLET | Freq: Two times a day (BID) | ORAL | Status: AC

## 2013-12-04 MED ORDER — OXYCODONE-ACETAMINOPHEN 5-325 MG PO TABS: 1.0000 | ORAL_TABLET | ORAL | Status: DC | PRN

## 2013-12-04 MED ORDER — SENNOSIDES-DOCUSATE SODIUM 8.6-50 MG PO TABS: 1.0000 | ORAL_TABLET | Freq: Every day | ORAL | Status: AC

## 2013-12-04 MED ORDER — SPIRONOLACTONE 25 MG PO TABS: 25.0000 mg | ORAL_TABLET | Freq: Every day | ORAL | Status: DC

## 2013-12-04 MED ORDER — ASPIRIN 325 MG PO TABS: 325.0000 mg | ORAL_TABLET | Freq: Every day | ORAL | Status: AC

## 2013-12-04 MED ORDER — FUROSEMIDE 20 MG PO TABS: 60.0000 mg | ORAL_TABLET | Freq: Two times a day (BID) | ORAL | Status: DC

## 2013-12-04 MED ORDER — DEXTROSE 5 % IV SOLN: 1.0000 g | Freq: Three times a day (TID) | INTRAVENOUS | Status: DC

## 2013-12-04 NOTE — Progress Notes (Signed)
1635 report  Given to Jackelyn Poling, Nurse from [facility]

## 2013-12-04 NOTE — Progress Notes (Signed)
Physical Therapy RE-evaluation and Treatment Patient Details Name: Fernando Lane MRN: [id number] DOB: 1950/11/30 Today's Date: 12/04/2013    History of Present Illness 63 yo male admitted from SNF in [place] after recent hospital admission for ischemic Rt LE and Nstemi. pt s/p fall at snf with new spleen contusion and Rib fx. Pt now readmitted with HTN, sepsis due to pyelonephritis, and metabolic encephalopathy. 9/21 Rt 4th toe amputation    PT Comments    Patient is s/p Rt 4th toe amputation resulting in functional limitations due to the deficits listed below (see PT Problem List). Pt will be non-ambulatory as long as he is NWB on Rt leg as his Lt knee frequently buckles due to pain/weakness. Currently pt severely limited by Rt foot/leg pain that is less when sitting EOB with leg in dependent position and much worse when he lies supine (even with only one pillow under leg and HOB elevated).  RN made aware that his pain is more ischemic than post-surgical and that this is a change from prior to surgery. Patient will benefit from skilled PT to increase their independence and safety with mobility at a wheelchair level to allow discharge to the venue listed below.     Follow Up Recommendations  SNF;Supervision/Assistance - 24 hour     Equipment Recommendations  Wheelchair ;Wheelchair cushion    Recommendations for Other Services       Precautions / Restrictions Precautions Precautions: Fall Restrictions Weight Bearing Restrictions: Yes RLE Weight Bearing: Non weight bearing Other Position/Activity Restrictions: "Pt may occasionally partial wt bear but should avoid as much as possible" per MD order    Mobility  Bed Mobility Overal bed mobility: Needs Assistance Bed Mobility: Supine to Sit;Sit to Supine;Rolling Rolling: Supervision   Supine to sit: Min assist;HOB elevated Sit to supine: Supervision   General bed mobility comments: vc for sequencing and for use of rails  (especially with scoot to HOB in supine +2 max assist)  Transfers Overall transfer level: Needs assistance Equipment used: None Transfers: Lateral/Scoot Transfers          Lateral/Scoot Transfers: Min guard General transfer comment: lateral scoot along EOB to his Lt; vc and visual cues for technique. Pt refused to attempt to recliner (recent pain medicine, becoming sleepy and wanted to lie down due to little sleep last night)  Ambulation/Gait             General Gait Details: pt will be unable to ambulate and maintain NWB RLE as his Lt knee tends to buckle due to arthritis/pain (had falls PTA due to Lt knee)   Stairs            Wheelchair Mobility    Modified Rankin (Stroke Patients Only)       Balance   Sitting-balance support: Feet supported;No upper extremity supported Sitting balance-Leahy Scale: Fair                              Cognition Arousal/Alertness: Awake/alert Behavior During Therapy: WFL for tasks assessed/performed Overall Cognitive Status: Within Functional Limits for tasks assessed                      Exercises      General Comments General comments (skin integrity, edema, etc.): Extensive educated pt on need to elevate RLE to decr edema and pain (on arrival pt sitting EOB with feet hanging down). Pt assisted into this position and was  unable to tolerate due to severe ischemic pain). Repositioned with head elevated and foot of bed lowered and pt still in severe pain.       Pertinent Vitals/Pain Pain Assessment: 0-10 Pain Score: 10-Worst pain ever Pain Location: Rt lower leg/foot Pain Descriptors / Indicators: Moaning;Spasm ("Drawing up") Pain Intervention(s): Premedicated before session;Limited activity within patient's tolerance;Repositioned (RN notified re: ischemic pain; requested she doppler pulses)    Home Living                      Prior Function            PT Goals (current goals can now be  found in the care plan section) Acute Rehab PT Goals Patient Stated Goal: To go to NH for therapy PT Goal Formulation: With patient Time For Goal Achievement: 12/18/13 Potential to Achieve Goals: Good Progress towards PT goals: Goals downgraded-see care plan    Frequency  Min 2X/week    PT Plan Current plan remains appropriate    Co-evaluation             End of Session   Activity Tolerance: Patient limited by pain Patient left: in bed;with call bell/phone within reach     Time: 1126-1149 PT Time Calculation (min): 23 min  Charges:  $Therapeutic Activity: 8-22 mins    Re-evaluation                   G Codes:      Lindey Renzulli 12/06/2013, 12:09 PM Pager [phone]

## 2013-12-04 NOTE — Progress Notes (Signed)
Patient ID: Fernando Lane, male   DOB: [birth year], 63 y.o.   MRN: [id number] ADVANCED HEART FAILURE ROUNDING NOTE DAILY PROGRESS NOTE  Subjective:  Post Op Day 1 S/P R 4th toe amputation. Weight down 4 pounds.   Complaining of R foot pain.   Patient now DNR.   Objective:  Temp:  [97.8 F (36.6 C)-98.9 F (37.2 C)] 97.9 F (36.6 C) (09/22 0644) Pulse Rate:  [79-89] 89 (09/22 0644) Resp:  [16-24] 20 (09/22 0644) BP: (85-108)/(52-69) 96/65 mmHg (09/22 0644) SpO2:  [93 %-100 %] 97 % (09/22 0644) Weight:  [192 lb 14.4 oz (87.5 kg)] 192 lb 14.4 oz (87.5 kg) (09/22 0644) Weight change:   Intake/Output from previous day: 09/21 0701 - 09/22 0700 In: 740 [P.O.:240; I.V.:500] Out: 100 [Urine:100]  Intake/Output from this shift:    Scheduled Meds: . aspirin  325 mg Oral Daily  . atorvastatin  80 mg Oral q1800  . bacitracin   Topical BID  . carvedilol  3.125 mg Oral BID WC  . ceFEPime (MAXIPIME) IV  1 g Intravenous 3 times per day  . Chlorhexidine Gluconate Cloth  6 each Topical Q0600  . enoxaparin (LOVENOX) injection  45 mg Subcutaneous Daily  . furosemide  60 mg Oral BID  . Gerhardt's butt cream   Topical BID  . Influenza vac split quadrivalent PF  0.5 mL Intramuscular Tomorrow-1000  . lisinopril  2.5 mg Oral QPM  . mupirocin ointment  1 application Nasal BID  . saccharomyces boulardii  250 mg Oral BID  . senna-docusate  1 tablet Oral QHS  . sodium chloride  3 mL Intravenous Q8H  . spironolactone  25 mg Oral Daily   Continuous Infusions:  PRN Meds:.albuterol, bisacodyl, docusate sodium, fentaNYL, oxyCODONE  Physical Exam: General:  Chronically ill-appearing. No resp difficulty. Sitting on the side of the bed.  HEENT: normal Neck: supple. JVP 8-9 cm. Carotids 2+ bilat; no bruits.  Cor: RRR distant Lungs: clear anteriorly Abdomen: markedly obese mildly distended.  Good bowel sounds. Extremities: no cyanosis, clubbing, rash.  No edema. decubitus ulcers covered. R foot  dressing CDI.    Neuro: alert & orientedx3, cranial nerves grossly intact. moves all 4 extremities w/o difficulty. Affect pleasant   Lab Results: Results for orders placed during the hospital encounter of 11/15/13 (from the past 48 hour(s))  GLUCOSE, CAPILLARY     Status: Abnormal   Collection Time    12/02/13 11:17 AM      Result Value Ref Range   Glucose-Capillary 119 (*) 70 - 99 mg/dL   Comment 1 Notify RN    CBC     Status: Abnormal   Collection Time    12/03/13  5:58 AM      Result Value Ref Range   WBC 17.3 (*) 4.0 - 10.5 K/uL   Comment: WHITE COUNT CONFIRMED ON SMEAR   RBC 5.09  4.22 - 5.81 MIL/uL   Hemoglobin 14.5  13.0 - 17.0 g/dL   HCT 45.3  39.0 - 52.0 %   MCV 89.0  78.0 - 100.0 fL   MCH 28.5  26.0 - 34.0 pg   MCHC 32.0  30.0 - 36.0 g/dL   RDW 18.1 (*) 11.5 - 15.5 %   Platelets 689 (*) 150 - 400 K/uL   Comment: PLATELET COUNT CONFIRMED BY SMEAR  BASIC METABOLIC PANEL     Status: Abnormal   Collection Time    12/03/13  5:58 AM      Result Value Ref  Range   Sodium 135 (*) 137 - 147 mEq/L   Potassium 4.7  3.7 - 5.3 mEq/L   Chloride 97  96 - 112 mEq/L   CO2 25  19 - 32 mEq/L   Glucose, Bld 87  70 - 99 mg/dL   BUN 29 (*) 6 - 23 mg/dL   Creatinine, Ser 1.05  0.50 - 1.35 mg/dL   Calcium 8.8  8.4 - 10.5 mg/dL   GFR calc non Af Amer 73 (*) >90 mL/min   GFR calc Af Amer 85 (*) >90 mL/min   Comment: (NOTE)     The eGFR has been calculated using the CKD EPI equation.     This calculation has not been validated in all clinical situations.     eGFR's persistently <90 mL/min signify possible Chronic Kidney     Disease.   Anion gap 13  5 - 15  SURGICAL PCR SCREEN     Status: Abnormal   Collection Time    12/03/13  6:30 AM      Result Value Ref Range   MRSA, PCR POSITIVE (*) NEGATIVE   Comment: RESULT CALLED TO, READ BACK BY AND VERIFIED WITH:     L.MUELLER,RN 12/03/13 0849 BY BSLADE   Staphylococcus aureus POSITIVE (*) NEGATIVE   Comment:            The Xpert SA  Assay (FDA     approved for NASAL specimens     in patients over 60 years of age),     is one component of     a comprehensive surveillance     program.  Test performance has     been validated by [employer] for patients greater     than or equal to 19 year old.     It is not intended     to diagnose infection nor to     guide or monitor treatment.   Assessment: 1. 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  Not LVAD candidate with severe RV failure and gangrene.      --EF approximately 35%, RV severely dilated. Difficult study.  2. Anasarca 3. CAD s/p out-of -hospital LAD infarct 5/15, patient also had likely RV infarction.      --LAD and RCA 100% occluded. Lcx 30% EF 20% -> medical management 4. 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 PAD, severe.  6. Morbid obesity 7. Acute on chronic respiratory failure 8. Severe decubiti 9. C difficile colitis: He has completed treatment.   10. Acute kidney injury, improved 11. Protein-calorie malnutrition 12. RUE Edema.  13. Hypomagnesemia 14. Hypotension 15. UTI: E coli and Pseudomonas grew 16. 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   Plan/discussion:  BP soft.  Continue current Lasix dose.  Continue low dose of Coreg and lisinopril.  Hold only for SBP </=85.   Cefepime for UTI with growth of Pseudomonas and E coli.    Resume PT today.     CLEGG,AMY, NP-C  7:55 AM 12/04/2013   Patient seen with NP, agree with the above note.  No changes to medication regimen.  Suspect he is nearing discharge to rehab.  Stable from cardiac perspective.   Loralie Champagne 12/04/2013 8:32 AM

## 2013-12-04 NOTE — Progress Notes (Deleted)
Pt. Continuously pulling at leads on telemetry box. CMT notified. Pts. Son at bedside. RN will continue to monitor pt. For changes in condition. Kamie Korber, Katherine Roan

## 2013-12-04 NOTE — Discharge Summary (Signed)
Physician Discharge Summary  Fernando Lane LGX:[id number] DOB: 1950/06/21 DOA: 11/15/2013  PCP: Gwendolyn Grant, MD  Admit date: 11/15/2013 Discharge date: 12/04/2013  Time spent: 35 minutes  Recommendations for Outpatient Follow-up:  1. Follow up with PCP in 1-2 weeks 2. Follow up with Dr. Oneida Alar in 2-4 weeks 3. Continue IV for cefepeime and remove IV after cefepime is finished 4. Hold BP meds for sbp<85  Discharge Diagnoses:  Active Problems:   CORONARY ARTERY DISEASE, VESSEL TYPE UNSPECIFIED   Atherosclerosis of native arteries of the extremities with ulceration(440.23)   Septic shock   Acute respiratory failure   Pneumonia   C. difficile colitis   AKI (acute kidney injury)   Dry gangrene   Right ventricular dysfunction   Cardiomyopathy, ischemic   Shock circulatory   Hypervolemia   RVF (right ventricular failure)   Acute on chronic systolic heart failure   Right heart failure   Hypotension arterial   UTI (urinary tract infection)   Hypotension, unspecified   DNR (do not resuscitate)  Discharge Condition: Improved  Diet recommendation: Heart healthy  Filed Weights   12/02/13 0500 12/03/13 0727 12/04/13 0644  Weight: 89 kg (196 lb 3.4 oz) 88.96 kg (196 lb 1.9 oz) 87.5 kg (192 lb 14.4 oz)    History of present illness:  Please see admit h and p from 9/3 for details. Briefly, pt presents to the hospital with sob, found to be in septic shock and intubated in the ED. Pt was found to have dry gangrene of the R 4th toe and admitted to the ICU service.  Hospital Course:  Significant Events:  9/3 Intubated in [facility] ED, transferred to [facility]  9/4 Extubated. Trop I elevated @ 2.63  9/04 WOC consult: Entire buttocks and posterior scrotum with erythremia and patchy areas of skin loss. Right foot is purple and mottled, some toes with dry intact eschar.  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  -doubt this is the  source of his sepsis. Plavix discontinued in anticipation for possible surgery  9/4 TTE: Limited study, akinetic apex with unusual appearance, cannot rule out distal muscular VSD, mildly dilated RA, severely dilated RV, moderately dilated RA moderate TR, no RVSP estimate given  9/05 Cards Consult: consider cardiac MRI to further elucidate the abnormal LV anatomy and clarify cause of RV dilation  9/07 Weaning off vasopressors  9/08 Off vasopressors. Diuresis ordered for severe hypervolemia. Discussed with Dr Debara Pickett re: Echo findings of severe RV dilation. Consider RHC when more stabilized.  9/08 Urology consult: Rec leaving Foley cath until penile and scrotal edema resolve  9/08 PM: Furosemide gtt initiated by Cardiology  9/09 Advanced Directives conversation initiated 9/09 by Dr Alva Garnet. Advised no CPR/ACLS and DNI or short term intubation only. Palliative Care consultation requested. Transfer to SDU ordered.  9/10 [facility] assumed care  9/11 > 9/16 uneventful SDU stay w/ gradual improvement in general medical condition  9/17 transfer to medical bed - determination made to proceed w/ vascular surgery on 9/21 9/21 Pt underwent amputation of 4th R toe  Septic shock  Completed abx for C diff - septic physiology resolved  Pseudomembranous colitis  PCR positive 8/31 - 10 days Fidaxomicin completed 9/09  UTI - Pseudomonas and Escherichia coli  UA c/w UTI - given ceftriaxone initially but speciation and sensitivities necessitate change to cefepime - high risk for C diff recurrence - monitor closely  Dry gangrene of fourth right toe - Severe PAD  -  Pt is s/p R 4th toe amputation on 9/21 -all large vessels were patent on recent arteriogram  Asymptomatyic hypotension  -Stable  -Pt remains asymptomatic this AM  Acute on chronic biventricular heart failure  -Cardiology cont to address diuresis/fluid balance - not LVAD candidate with severe RV failure and gangrene - EF approximately 35%, RV severely dilated -  agree pt clearly preload depending - may not be able to tolerate both ACE and BB  Inoperable CAD  s/p out-of-hospital LAD infarct 5/15, patient also had likely RV infarction - LAD and RCA 100% occluded - Lcx 30% EF 20% -> medical management  Acute respiratory failure  resolved  OSA  Continue CPAP nightly per home routine  Polycythemia vera  Following Hgb - stable - followed by Dr. Juliann Mule ([facility]) - baseline ptl count ~800K - baseline WBC reportedly around 15  -Blood counts remain stable  Chronic pain / severe debilitation  continue oxycodone IR - Palitiative Care was following - long term prognosis guarded  Obesity - Body mass index is 31.43 kg/(m^2).  MRSA screen +  Consultations:  Vascular surgery  Cardiology  Palliative Care  Critical Care  Discharge Exam: Filed Vitals:   12/03/13 1628 12/03/13 2112 12/03/13 2223 12/04/13 0644  BP: 86/56  85/52 96/65  Pulse: 83 86 82 89  Temp: 97.8 F (36.6 C)  98.3 F (36.8 C) 97.9 F (36.6 C)  TempSrc: Oral  Oral Oral  Resp: 18 18 18 20   Height:      Weight:    87.5 kg (192 lb 14.4 oz)  SpO2: 100% 95% 93% 97%    General: Awake, in nad Cardiovascular: regular, s1, s2 Respiratory: normal resp effort, no wheezing  Discharge Instructions     Medication List    STOP taking these medications       aspirin 81 MG chewable tablet  Replaced by:  aspirin 325 MG tablet     ciprofloxacin 500 MG tablet  Commonly known as:  CIPRO     diphenoxylate-atropine 2.5-0.025 MG per tablet  Commonly known as:  LOMOTIL     fidaxomicin 200 MG Tabs tablet  Commonly known as:  DIFICID     gabapentin 300 MG capsule  Commonly known as:  NEURONTIN     HYDROcodone-acetaminophen 7.5-325 MG per tablet  Commonly known as:  NORCO      TAKE these medications       anagrelide 0.5 MG capsule  Commonly known as:  AGRYLIN  Take 1 capsule (0.5 mg total) by mouth 2 (two) times daily.     aspirin 325 MG tablet  Take 1 tablet (325 mg total) by  mouth daily.     atorvastatin 80 MG tablet  Commonly known as:  LIPITOR  Take 1 tablet (80 mg total) by mouth daily at 6 PM.     benzonatate 100 MG capsule  Commonly known as:  TESSALON  Take 100 mg by mouth 3 (three) times daily as needed for cough.     carvedilol 3.125 MG tablet  Commonly known as:  COREG  Take 1 tablet (3.125 mg total) by mouth 2 (two) times daily with a meal.     ceFEPIme 1 g in dextrose 5 % 50 mL  Inject 1 g into the vein every 8 (eight) hours.     chlorhexidine 0.12 % solution  Commonly known as:  PERIDEX  Use as directed 15 mLs in the mouth or throat daily.     chlorpheniramine-HYDROcodone 10-8 MG/5ML Lqcr  Commonly  known as:  TUSSIONEX  Take 5 mLs by mouth every 12 (twelve) hours as needed for cough.     clopidogrel 75 MG tablet  Commonly known as:  PLAVIX  Take 75 mg by mouth daily at 6 PM.     colchicine 0.6 MG tablet  Take 0.6 mg by mouth daily at 6 PM.     feeding supplement (ENSURE COMPLETE) Liqd  Take 237 mLs by mouth 2 (two) times daily between meals.     folic acid 1 MG tablet  Commonly known as:  FOLVITE  Take 1 mg by mouth daily.     furosemide 20 MG tablet  Commonly known as:  LASIX  Take 3 tablets (60 mg total) by mouth 2 (two) times daily.     lisinopril 5 MG tablet  Commonly known as:  PRINIVIL,ZESTRIL  Take 1 tablet (5 mg total) by mouth daily.     multivitamin with minerals tablet  Take 1 tablet by mouth daily.     nitroGLYCERIN 0.4 MG SL tablet  Commonly known as:  NITROSTAT  Place 0.4 mg under the tongue every 5 (five) minutes as needed for chest pain.     oxyCODONE-acetaminophen 5-325 MG per tablet  Commonly known as:  PERCOCET/ROXICET  Take 1 tablet by mouth every 4 (four) hours as needed for severe pain.     saccharomyces boulardii 250 MG capsule  Commonly known as:  FLORASTOR  Take 1 capsule (250 mg total) by mouth 2 (two) times daily.     SANTYL ointment  Generic drug:  collagenase  Apply 1 application  topically daily as needed.     senna-docusate 8.6-50 MG per tablet  Commonly known as:  Senokot-S  Take 1 tablet by mouth at bedtime.     spironolactone 25 MG tablet  Commonly known as:  ALDACTONE  Take 1 tablet (25 mg total) by mouth daily.     thiamine 100 MG tablet  Take 1 tablet (100 mg total) by mouth daily.       No Known Allergies Follow-up Information   Follow up with Gwendolyn Grant, MD. Schedule an appointment as soon as possible for a visit in 1 week.   Specialty:  Internal Medicine   Contact information:   [address] [place] [address] [phone]       Follow up with Elam Dutch, MD. (2 to 4 weeks)    Specialty:  Vascular Surgery   Contact information:   [address] [place] [address] [phone]        The results of significant diagnostics from this hospitalization (including imaging, microbiology, ancillary and laboratory) are listed below for reference.    Significant Diagnostic Studies: Dg Chest Port 1 View  11/20/2013   CLINICAL DATA:  Respiratory failure.  EXAM: PORTABLE CHEST - 1 VIEW  COMPARISON:  11/16/2013.  FINDINGS: Right lung base now appears clear. Persistent left lung base opacity is most consistent with atelectasis likely with a small residual effusion. No pulmonary edema or convincing pneumonia.  Stable cardiomegaly.  No mediastinal or hilar masses.  Right internal jugular central venous line is unchanged. All other support apparatus have been removed.  IMPRESSION: 1. Improved lung aeration when compared to the prior study. No residual pulmonary edema. Improved/resolved right lung base atelectasis. Mild residual left lung base atelectasis likely with a small effusion.   Electronically Signed   By: Lajean Manes M.D.   On: 11/20/2013 07:44   Dg  Chest Port 1 View  11/16/2013   CLINICAL DATA:  Evaluate airspace disease.  EXAM: PORTABLE CHEST - 1 VIEW  COMPARISON:  11/15/2013  FINDINGS: Support devices  are in stable position. Cardiomegaly with vascular congestion. Low lung volumes with bibasilar opacities and layering effusions. No real change since prior study.  IMPRESSION: Stable bibasilar opacities and layering effusions. Low lung volumes.  Cardiomegaly, vascular congestion   Electronically Signed   By: Rolm Baptise M.D.   On: 11/16/2013 07:34   Dg Chest Port 1 View  11/15/2013   CLINICAL DATA:  Shortness of breath. Evaluate endotracheal tube and central line.  EXAM: PORTABLE CHEST - 1 VIEW  COMPARISON:  Chest radiograph 11/15/2013.  FINDINGS: ET tube terminates in the mid trachea. NG tube courses inferior to the diaphragm. Right IJ central venous catheter tip projects over the expected location of the right internal jugular vein. Stable cardiomegaly. Persistent retrocardiac consolidation. Possible left pleural effusion.  IMPRESSION: ET tube terminates in the mid trachea.  Right sided central venous catheter tip projects over the expected location of the right internal jugular vein.  Retrocardiac consolidation may represent atelectasis or pneumonia.   Electronically Signed   By: Lovey Newcomer M.D.   On: 11/15/2013 19:48    Microbiology: Recent Results (from the past 240 hour(s))  URINE CULTURE     Status: None   Collection Time    11/25/13  4:30 PM      Result Value Ref Range Status   Specimen Description URINE, CATHETERIZED   Final   Special Requests NONE   Final   Culture  Setup Time     Final   Value: 11/25/2013 19:29     Performed at [place]     Final   Value: >=100,000 COLONIES/ML     Performed at [facility]   Culture     Final   Value: PSEUDOMONAS AERUGINOSA     ESCHERICHIA COLI     Performed at [facility]   Report Status 11/30/2013 FINAL   Final   Organism ID, Bacteria PSEUDOMONAS AERUGINOSA   Final   Organism ID, Bacteria ESCHERICHIA COLI   Final  SURGICAL PCR SCREEN     Status: Abnormal   Collection Time    12/03/13  6:30 AM       Result Value Ref Range Status   MRSA, PCR POSITIVE (*) NEGATIVE Final   Comment: RESULT CALLED TO, READ BACK BY AND VERIFIED WITH:     L.MUELLER,RN 12/03/13 0849 BY BSLADE   Staphylococcus aureus POSITIVE (*) NEGATIVE Final   Comment:            The Xpert SA Assay (FDA     approved for NASAL specimens     in patients over 68 years of age),     is one component of     a comprehensive surveillance     program.  Test performance has     been validated by [employer] for patients greater     than or equal to 37 year old.     It is not intended     to diagnose infection nor to     guide or monitor treatment.     Labs: Basic Metabolic Panel:  Recent Labs Lab 11/28/13 0500 11/29/13 0430 11/30/13 0500 12/01/13 0346 12/03/13 0558  NA 136* 137  --  134* 135*  K 4.3 4.5  --  4.7 4.7  CL 94* 95*  --  95* 97  CO2 34* 28  --  26 25  GLUCOSE 78 80  --  76 87  BUN 17 19  --  26* 29*  CREATININE 0.84 0.90  --  1.19 1.05  CALCIUM 9.0 8.9  --  8.8 8.8  MG 2.0 2.0 1.8  --   --    Liver Function Tests: No results found for this basename: AST, ALT, ALKPHOS, BILITOT, PROT, ALBUMIN,  in the last 168 hours No results found for this basename: LIPASE, AMYLASE,  in the last 168 hours No results found for this basename: AMMONIA,  in the last 168 hours CBC:  Recent Labs Lab 11/28/13 0500 11/29/13 0430 12/01/13 0346 12/03/13 0558  WBC 16.3* 15.4* 16.0* 17.3*  HGB 14.2 14.4 14.1 14.5  HCT 45.9 46.5 44.9 45.3  MCV 91.4 90.8 90.0 89.0  PLT 562* 573* 723* 689*   Cardiac Enzymes: No results found for this basename: CKTOTAL, CKMB, CKMBINDEX, TROPONINI,  in the last 168 hours BNP: BNP (last 3 results)  Recent Labs  08/07/13 1600 08/23/13 1759 11/15/13 1951  PROBNP 13375.0* 9383.0* 9486.0*   CBG:  Recent Labs Lab 11/30/13 2100 12/01/13 1122 12/01/13 1610 12/01/13 2133 12/02/13 1117  GLUCAP 140* 99 97 92 119*   Signed:  Nyjai Graff K  [facility] 12/04/2013,  1:52 PM

## 2013-12-04 NOTE — Progress Notes (Signed)
Complains of right foot pain  Filed Vitals:   12/03/13 1628 12/03/13 2112 12/03/13 2223 12/04/13 0644  BP: 86/56  85/52 96/65  Pulse: 83 86 82 89  Temp: 97.8 F (36.6 C)  98.3 F (36.8 C) 97.9 F (36.6 C)  TempSrc: Oral  Oral Oral  Resp: 18 18 18 20   Height:      Weight:    192 lb 14.4 oz (87.5 kg)  SpO2: 100% 95% 93% 97%    Right foot incision intact, no drainage or hematoma, some surrounding skin erythema  S/p right fourth toe amp.  Needs to be non wt bearing or partial wt bearing at most on this foot There was minimal bleeding from amputation site and this is tenuous  Can d/c from our standpoint with follow up in 2-4 weeks Will review pain med schedule  Ruta Hinds, MD [facility]: [phone] Pager: [phone]

## 2013-12-04 NOTE — Progress Notes (Signed)
Pt. Was placed on CPAP of 10cm H2O via [facility]. Pt. Is tolerating CPAP well at this time without any complications.

## 2013-12-04 NOTE — Clinical Social Work Placement (Signed)
     Clinical Social Work Department CLINICAL SOCIAL WORK PLACEMENT NOTE 12/04/2013  Patient:  Fernando Lane, Fernando Lane  Account Number:  [id number] Admit date:  11/15/2013  Clinical Social Worker:  Berton Mount, Latanya Presser  Date/time:  11/23/2013 03:00 PM  Clinical Social Work is seeking post-discharge placement for this patient at the following level of care:   [facility]   (*CSW will update this form in Epic as items are completed)   11/23/2013  Patient/family provided with [facility] Department of Clinical Social Works list of facilities offering this level of care within the geographic area requested by the patient (or if unable, by the patients family).  11/23/2013  Patient/family informed of their freedom to choose among providers that offer the needed level of care, that participate in Medicare, Medicaid or managed care program needed by the patient, have an available bed and are willing to accept the patient.  11/23/2013  Patient/family informed of [facility] ownership interest in [facility], as well as of the fact that they are under no obligation to receive care at this facility.  PASARR submitted to EDS on  PASARR number received on   FL2 transmitted to all facilities in geographic area requested by pt/family on  11/23/2013 FL2 transmitted to all facilities within larger geographic area on   Patient informed that his/her managed care company has contracts with or will negotiate with  certain facilities, including the following:     Patient/family informed of bed offers received:  12/03/2013 Patient chooses bed at [facility] Physician recommends and patient chooses bed at    Patient to be transferred to [facility] on  12/04/2013 Patient to be transferred to facility by Ambulance  [facility]) Patient and family notified of transfer on 12/04/2013 Name of family member notified:  Midge Minium- Son  The following physician request were  entered in Epic: Physician Request  Please sign FL2.  Please prepare priority discharge summary and prescriptions.    Additional Comments: 12/04/13  OK per MD for d/c today to SNF.  CSW offered bed choices to patient and son- they have elected to return to [facility] as patient is familiar with the facility. Patient and son are agreeable and pleased with d/c choice. Notified Fransisco Beau- at the SNF and he agreed to return.  DC summary sent to facility; d/c packet completed and nursing called report.  CSW signing off.

## 2013-12-04 NOTE — Telephone Encounter (Addendum)
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 Oneida Alar in 2-4 weeks            Thanks,      Maudie Mercury ------  notified patient's son, robbie, of patient's post op appointment with dr. Oneida Alar on 12-27-13 at 8:30 am

## 2013-12-06 ENCOUNTER — Telehealth: Payer: Self-pay | Admitting: *Deleted

## 2013-12-06 NOTE — Telephone Encounter (Signed)
Elyse Hsu , a [facility] called to verify if patient was do do any weight bearing on his right foot s/p amputation of right fourth digit.  Per Dr Oneida Alar, the patient should not be having any weight bearing on this foot. He will order darco boot on office visit 12/27/13.  Elyse Hsu understood these instructions.

## 2013-12-10 ENCOUNTER — Ambulatory Visit: Payer: Medicare Other | Admitting: Internal Medicine

## 2013-12-10 DIAGNOSIS — R4689 Other symptoms and signs involving appearance and behavior: Secondary | ICD-10-CM | POA: Insufficient documentation

## 2013-12-10 DIAGNOSIS — Z0289 Encounter for other administrative examinations: Secondary | ICD-10-CM

## 2013-12-11 ENCOUNTER — Inpatient Hospital Stay (HOSPITAL_COMMUNITY): Admission: RE | Admit: 2013-12-11 | Payer: Medicare Other | Source: Ambulatory Visit

## 2013-12-14 ENCOUNTER — Telehealth: Payer: Self-pay | Admitting: Vascular Surgery

## 2013-12-14 NOTE — Telephone Encounter (Signed)
Received call from [facility]. Judd Lien from [facility] called them needing a fax from [place] for transportation. Spoke with Pam at the Rehab. She said they just needed to verify time and date of appointment. Told Monique this was taken care of.

## 2013-12-17 ENCOUNTER — Telehealth: Payer: Self-pay | Admitting: Cardiology

## 2013-12-17 NOTE — Telephone Encounter (Signed)
Pt is on 1800 cc fluid restriction daily that he states was ordered when he was d/c'd from the Hospital, he states wt has been running 190-205, he states he would like to be aloud to have more than 1800, he denies edema or SOB, will send to Dr Aundra Dubin for review

## 2013-12-17 NOTE — Telephone Encounter (Signed)
NEW PROBLEM:   Pt called to report he is in  [facility], pt would like to talk to the RN about his water restrictions please.    Give pt a call.

## 2013-12-18 NOTE — Telephone Encounter (Signed)
That would be ok, but keep it 2500 cc or less.

## 2013-12-18 NOTE — Telephone Encounter (Signed)
Left message to call back  

## 2013-12-19 NOTE — Telephone Encounter (Signed)
[facility] [place] at [phone] and gave verbal order increase fluid restrictions, left mess for pt this was done

## 2013-12-26 ENCOUNTER — Encounter: Payer: Self-pay | Admitting: Vascular Surgery

## 2013-12-27 ENCOUNTER — Ambulatory Visit (INDEPENDENT_AMBULATORY_CARE_PROVIDER_SITE_OTHER): Payer: Self-pay | Admitting: Vascular Surgery

## 2013-12-27 ENCOUNTER — Encounter: Payer: Self-pay | Admitting: Vascular Surgery

## 2013-12-27 VITALS — BP 92/58 | HR 90 | Temp 97.5°F | Ht 66.0 in | Wt 192.0 lb

## 2013-12-27 DIAGNOSIS — I739 Peripheral vascular disease, unspecified: Secondary | ICD-10-CM

## 2013-12-27 DIAGNOSIS — I70209 Unspecified atherosclerosis of native arteries of extremities, unspecified extremity: Secondary | ICD-10-CM | POA: Insufficient documentation

## 2013-12-27 DIAGNOSIS — L98499 Non-pressure chronic ulcer of skin of other sites with unspecified severity: Secondary | ICD-10-CM

## 2013-12-27 NOTE — Progress Notes (Signed)
This is a 63 year old male who underwent right fourth toe amputation approximately 3 and half weeks ago. He is currently living in a skilled nursing facility but is to be discharged from there soon. He denies any pain in his left foot. He has no ulcers on left foot.  Physcal exam:  Filed Vitals:   12/27/13 0844  BP: 92/58  Pulse: 90  Temp: 97.5 F (36.4 C)  TempSrc: Oral  Height: 5\' 6"  (1.676 m)  Weight: 192 lb (87.091 kg)  SpO2: 99%    Incision intact with sutures in place some maceration of scan that overall appears to be healing.  Assessment: Status post right fourth toe amputation  Plan: Remove sutures today. Followup when necessary. Wash with soap and water once daily. Patient is not a candidate for revascularization if this fails he will need either right below-knee or above-knee amputation.  Ruta Hinds, MD [facility] Office: [phone] Pager: [phone]

## 2013-12-31 ENCOUNTER — Telehealth: Payer: Self-pay | Admitting: Hematology

## 2013-12-31 NOTE — Telephone Encounter (Signed)
pt called to sched appt...done...pt ok and aware of d.t °

## 2014-01-02 ENCOUNTER — Encounter: Payer: Self-pay | Admitting: *Deleted

## 2014-01-04 ENCOUNTER — Telehealth: Payer: Self-pay

## 2014-01-04 NOTE — Telephone Encounter (Signed)
Phone call from Physical Therapist @ [facility].  Questioned about pt's weight bearing status at this point.  Per Dr. Bridgett Larsson, since pt. is 4 weeks post-op, okay to resume normal weight bearing.  Notified [facility].; spoke to [place].  Advised of MD recommendation for normal weight bearing.

## 2014-01-07 ENCOUNTER — Inpatient Hospital Stay (HOSPITAL_COMMUNITY): Admission: RE | Admit: 2014-01-07 | Payer: Medicare Other | Source: Ambulatory Visit

## 2014-01-07 ENCOUNTER — Other Ambulatory Visit: Payer: Self-pay | Admitting: *Deleted

## 2014-01-07 DIAGNOSIS — D751 Secondary polycythemia: Secondary | ICD-10-CM

## 2014-01-08 ENCOUNTER — Other Ambulatory Visit: Payer: Medicare Other

## 2014-01-08 ENCOUNTER — Ambulatory Visit: Payer: Medicare Other

## 2014-01-11 ENCOUNTER — Telehealth: Payer: Self-pay | Admitting: Hematology

## 2014-01-11 NOTE — Telephone Encounter (Signed)
Pt called lft msg to r/s 10/27 labs/ov, called pt back twice on 10/30 phone is not working per phone service, r/s pt for 11/25 labs/ov mailed out sch .Marland Kitchen... KJ

## 2014-01-16 ENCOUNTER — Ambulatory Visit: Payer: Medicare Other | Admitting: Internal Medicine

## 2014-01-17 ENCOUNTER — Emergency Department (HOSPITAL_COMMUNITY)
Admission: EM | Admit: 2014-01-17 | Discharge: 2014-01-17 | Disposition: A | Discharge status: Home / Self Care | Payer: Medicare Other | Attending: Emergency Medicine | Admitting: Emergency Medicine

## 2014-01-17 ENCOUNTER — Emergency Department (HOSPITAL_COMMUNITY): Payer: Medicare Other

## 2014-01-17 ENCOUNTER — Encounter (HOSPITAL_COMMUNITY): Payer: Self-pay | Admitting: *Deleted

## 2014-01-17 DIAGNOSIS — Y9389 Activity, other specified: Secondary | ICD-10-CM | POA: Diagnosis not present

## 2014-01-17 DIAGNOSIS — Z7982 Long term (current) use of aspirin: Secondary | ICD-10-CM | POA: Insufficient documentation

## 2014-01-17 DIAGNOSIS — Z9981 Dependence on supplemental oxygen: Secondary | ICD-10-CM | POA: Diagnosis not present

## 2014-01-17 DIAGNOSIS — G4733 Obstructive sleep apnea (adult) (pediatric): Secondary | ICD-10-CM | POA: Diagnosis not present

## 2014-01-17 DIAGNOSIS — S0031XA Abrasion of nose, initial encounter: Secondary | ICD-10-CM | POA: Insufficient documentation

## 2014-01-17 DIAGNOSIS — Z87891 Personal history of nicotine dependence: Secondary | ICD-10-CM | POA: Insufficient documentation

## 2014-01-17 DIAGNOSIS — Z79899 Other long term (current) drug therapy: Secondary | ICD-10-CM | POA: Diagnosis not present

## 2014-01-17 DIAGNOSIS — I2510 Atherosclerotic heart disease of native coronary artery without angina pectoris: Secondary | ICD-10-CM | POA: Insufficient documentation

## 2014-01-17 DIAGNOSIS — M5030 Other cervical disc degeneration, unspecified cervical region: Secondary | ICD-10-CM | POA: Diagnosis not present

## 2014-01-17 DIAGNOSIS — G8929 Other chronic pain: Secondary | ICD-10-CM | POA: Diagnosis not present

## 2014-01-17 DIAGNOSIS — S0081XA Abrasion of other part of head, initial encounter: Secondary | ICD-10-CM | POA: Insufficient documentation

## 2014-01-17 DIAGNOSIS — S0093XA Contusion of unspecified part of head, initial encounter: Secondary | ICD-10-CM | POA: Diagnosis not present

## 2014-01-17 DIAGNOSIS — I739 Peripheral vascular disease, unspecified: Secondary | ICD-10-CM | POA: Diagnosis not present

## 2014-01-17 DIAGNOSIS — W010XXA Fall on same level from slipping, tripping and stumbling without subsequent striking against object, initial encounter: Secondary | ICD-10-CM | POA: Diagnosis not present

## 2014-01-17 DIAGNOSIS — Y92009 Unspecified place in unspecified non-institutional (private) residence as the place of occurrence of the external cause: Secondary | ICD-10-CM | POA: Diagnosis not present

## 2014-01-17 DIAGNOSIS — Z7902 Long term (current) use of antithrombotics/antiplatelets: Secondary | ICD-10-CM | POA: Insufficient documentation

## 2014-01-17 DIAGNOSIS — D751 Secondary polycythemia: Secondary | ICD-10-CM | POA: Diagnosis not present

## 2014-01-17 DIAGNOSIS — R52 Pain, unspecified: Secondary | ICD-10-CM

## 2014-01-17 DIAGNOSIS — S0990XA Unspecified injury of head, initial encounter: Secondary | ICD-10-CM | POA: Diagnosis present

## 2014-01-17 DIAGNOSIS — I10 Essential (primary) hypertension: Secondary | ICD-10-CM | POA: Diagnosis not present

## 2014-01-17 DIAGNOSIS — W19XXXA Unspecified fall, initial encounter: Secondary | ICD-10-CM

## 2014-01-17 MED ORDER — BACITRACIN ZINC 500 UNIT/GM EX OINT: TOPICAL_OINTMENT | Freq: Two times a day (BID) | CUTANEOUS | Status: DC

## 2014-01-17 NOTE — ED Notes (Signed)
Patient was using the bathroom and states his walker collapsed while he was ambulating. He fell face first. He denies loss of consciousness and denies pain.

## 2014-01-17 NOTE — ED Notes (Signed)
Bed: WA06 Expected date:  Expected time:  Means of arrival:  Comments: EMS 63 yo male/fall/forehead lac

## 2014-01-17 NOTE — Discharge Instructions (Signed)
It was our pleasure to provide your ER care today - we hope that you feel better.  Keep abrasions clean. You may apply thin coat of bacitracin for the next couple days. Take tylenol as need. Fall precautions. Follow up with primary care doctor in coming week.  Return to ER if worse, new symptoms, severe pain, infection of wounds, other concern.      Abrasion An abrasion is a cut or scrape of the skin. Abrasions do not extend through all layers of the skin and most heal within 10 days. It is important to care for your abrasion properly to prevent infection. CAUSES  Most abrasions are caused by falling on, or gliding across, the ground or other surface. When your skin rubs on something, the outer and inner layer of skin rubs off, causing an abrasion. DIAGNOSIS  Your caregiver will be able to diagnose an abrasion during a physical exam.  TREATMENT  Your treatment depends on how large and deep the abrasion is. Generally, your abrasion will be cleaned with water and a mild soap to remove any dirt or debris. An antibiotic ointment may be put over the abrasion to prevent an infection. A bandage (dressing) may be wrapped around the abrasion to keep it from getting dirty.  You may need a tetanus shot if:  You cannot remember when you had your last tetanus shot.  You have never had a tetanus shot.  The injury broke your skin. If you get a tetanus shot, your arm may swell, get red, and feel warm to the touch. This is common and not a problem. If you need a tetanus shot and you choose not to have one, there is a rare chance of getting tetanus. Sickness from tetanus can be serious.  HOME CARE INSTRUCTIONS   If a dressing was applied, change it at least once a day or as directed by your caregiver. If the bandage sticks, soak it off with warm water.   Wash the area with water and a mild soap to remove all the ointment 2 times a day. Rinse off the soap and pat the area dry with a clean towel.    Reapply any ointment as directed by your caregiver. This will help prevent infection and keep the bandage from sticking. Use gauze over the wound and under the dressing to help keep the bandage from sticking.   Change your dressing right away if it becomes wet or dirty.   Only take over-the-counter or prescription medicines for pain, discomfort, or fever as directed by your caregiver.   Follow up with your caregiver within 24-48 hours for a wound check, or as directed. If you were not given a wound-check appointment, look closely at your abrasion for redness, swelling, or pus. These are signs of infection. SEEK IMMEDIATE MEDICAL CARE IF:   You have increasing pain in the wound.   You have redness, swelling, or tenderness around the wound.   You have pus coming from the wound.   You have a fever or persistent symptoms for more than 2-3 days.  You have a fever and your symptoms suddenly get worse.  You have a bad smell coming from the wound or dressing.  MAKE SURE YOU:   Understand these instructions.  Will watch your condition.  Will get help right away if you are not doing well or get worse. Document Released: 12/09/2004 Document Revised: 02/16/2012 Document Reviewed: 02/02/2011 [facility] Patient Information 2015 [place], [place]. This information is not intended to replace  advice given to you by your health care provider. Make sure you discuss any questions you have with your health care provider.     Facial or Scalp Contusion A facial or scalp contusion is a deep bruise on the face or head. Injuries to the face and head generally cause a lot of swelling, especially around the eyes. Contusions are the result of an injury that caused bleeding under the skin. The contusion may turn blue, purple, or yellow. Minor injuries will give you a painless contusion, but more severe contusions may stay painful and swollen for a few weeks.  CAUSES  A facial or scalp contusion is  caused by a blunt injury or trauma to the face or head area.  SIGNS AND SYMPTOMS   Swelling of the injured area.   Discoloration of the injured area.   Tenderness, soreness, or pain in the injured area.  DIAGNOSIS  The diagnosis can be made by taking a medical history and doing a physical exam. An X-ray exam, CT scan, or MRI may be needed to determine if there are any associated injuries, such as broken bones (fractures). TREATMENT  Often, the best treatment for a facial or scalp contusion is applying cold compresses to the injured area. Over-the-counter medicines may also be recommended for pain control.  HOME CARE INSTRUCTIONS   Only take over-the-counter or prescription medicines as directed by your health care provider.   Apply ice to the injured area.   Put ice in a plastic bag.   Place a towel between your skin and the bag.   Leave the ice on for 20 minutes, 2-3 times a day.  SEEK MEDICAL CARE IF:  You have bite problems.   You have pain with chewing.   You are concerned about facial defects. SEEK IMMEDIATE MEDICAL CARE IF:  You have severe pain or a headache that is not relieved by medicine.   You have unusual sleepiness, confusion, or personality changes.   You throw up (vomit).   You have a persistent nosebleed.   You have double vision or blurred vision.   You have fluid drainage from your nose or ear.   You have difficulty walking or using your arms or legs.  MAKE SURE YOU:   Understand these instructions.  Will watch your condition.  Will get help right away if you are not doing well or get worse. Document Released: 04/08/2004 Document Revised: 12/20/2012 Document Reviewed: 10/12/2012 [facility] Patient Information 2015 [place], [place]. This information is not intended to replace advice given to you by your health care provider. Make sure you discuss any questions you have with your health care provider.     Fall Prevention and Home  Safety Falls cause injuries and can affect all age groups. It is possible to use preventive measures to significantly decrease the likelihood of falls. There are many simple measures which can make your home safer and prevent falls. OUTDOORS  Repair cracks and edges of walkways and driveways.  Remove high doorway thresholds.  Trim shrubbery on the main path into your home.  Have good outside lighting.  Clear walkways of tools, rocks, debris, and clutter.  Check that handrails are not broken and are securely fastened. Both sides of steps should have handrails.  Have leaves, snow, and ice cleared regularly.  Use sand or salt on walkways during winter months.  In the garage, clean up grease or oil spills. BATHROOM  Install night lights.  Install grab bars by the toilet and in  the tub and shower.  Use non-skid mats or decals in the tub or shower.  Place a plastic non-slip stool in the shower to sit on, if needed.  Keep floors dry and clean up all water on the floor immediately.  Remove soap buildup in the tub or shower on a regular basis.  Secure bath mats with non-slip, double-sided rug tape.  Remove throw rugs and tripping hazards from the floors. BEDROOMS  Install night lights.  Make sure a bedside light is easy to reach.  Do not use oversized bedding.  Keep a telephone by your bedside.  Have a firm chair with side arms to use for getting dressed.  Remove throw rugs and tripping hazards from the floor. KITCHEN  Keep handles on pots and pans turned toward the center of the stove. Use back burners when possible.  Clean up spills quickly and allow time for drying.  Avoid walking on wet floors.  Avoid hot utensils and knives.  Position shelves so they are not too high or low.  Place commonly used objects within easy reach.  If necessary, use a sturdy step stool with a grab bar when reaching.  Keep electrical cables out of the way.  Do not use floor  polish or wax that makes floors slippery. If you must use wax, use non-skid floor wax.  Remove throw rugs and tripping hazards from the floor. STAIRWAYS  Never leave objects on stairs.  Place handrails on both sides of stairways and use them. Fix any loose handrails. Make sure handrails on both sides of the stairways are as long as the stairs.  Check carpeting to make sure it is firmly attached along stairs. Make repairs to worn or loose carpet promptly.  Avoid placing throw rugs at the top or bottom of stairways, or properly secure the rug with carpet tape to prevent slippage. Get rid of throw rugs, if possible.  Have an electrician put in a light switch at the top and bottom of the stairs. OTHER FALL PREVENTION TIPS  Wear low-heel or rubber-soled shoes that are supportive and fit well. Wear closed toe shoes.  When using a stepladder, make sure it is fully opened and both spreaders are firmly locked. Do not climb a closed stepladder.  Add color or contrast paint or tape to grab bars and handrails in your home. Place contrasting color strips on first and last steps.  Learn and use mobility aids as needed. Install an electrical emergency response system.  Turn on lights to avoid dark areas. Replace light bulbs that burn out immediately. Get light switches that glow.  Arrange furniture to create clear pathways. Keep furniture in the same place.  Firmly attach carpet with non-skid or double-sided tape.  Eliminate uneven floor surfaces.  Select a carpet pattern that does not visually hide the edge of steps.  Be aware of all pets. OTHER HOME SAFETY TIPS  Set the water temperature for 120 F (48.8 C).  Keep emergency numbers on or near the telephone.  Keep smoke detectors on every level of the home and near sleeping areas. Document Released: 02/19/2002 Document Revised: 08/31/2011 Document Reviewed: 05/21/2011 [facility] Patient Information 2015 [place], [place]. This information  is not intended to replace advice given to you by your health care provider. Make sure you discuss any questions you have with your health care provider.

## 2014-01-17 NOTE — ED Provider Notes (Signed)
CSN: [id number]     Arrival date & time 01/17/14  2014 History   First MD Initiated Contact with Patient 01/17/14 2026     Chief Complaint  Patient presents with  . Fall  . Facial Laceration     (Consider location/radiation/quality/duration/timing/severity/associated sxs/prior Treatment) Patient is a 63 y.o. male presenting with fall. The history is provided by the patient and the EMS personnel.  Fall Pertinent negatives include no chest pain, no abdominal pain, no headaches and no shortness of breath.  pt presents s/p fall tonight at home. Was going w walker into bathroom, when walker got caught on flooring and collapsed on self, causing pt to fall forward. Denies any faintness or dizziness prior to fall. No loc w fall, was shaken/dazed. Contusion and abrasions to nose and forehead. Pt indicates tetanus utd within past couple years. States recent health actually quite good, feels improved from prior.  +head and forehead pain post fall. No severe headaches. No neck pain or back pain. No numbness/weakness. No cp or sob. No abd pain or nv.  Family arrives, states pt w hx 'platelet problem' - pt denies any recent change in meds, states tends to bruise easily at baseline.     Past Medical History  Diagnosis Date  . Chronic low back pain   . 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 Chronic neck pain   . OSA (obstructive sleep apnea)     CPAP @ bedtime  . Allergy   . Unspecified essential hypertension     Resistant, 2D Echo - EF-[id number]  . Polycythemia   . CAD (coronary artery disease)     Vessel type unspecified  . PVD (peripheral vascular disease)   . DDD (degenerative disc disease), cervical    Past Surgical History  Procedure Laterality Date  . Lower extrmity doppler      [place] 2008; no evidence for PAD  . US echocardiography  11/2008    Normal valves, mild LAE  . Left knee replacement  10/2006  . Cervical spine surgery  02/2008  . Joint  replacement    . Spine surgery    . 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 Heart attack Father    History  Substance Use Topics  . Smoking status: Former Smoker -- 0 years    Types: Cigarettes, Pipe    Quit date: 03/15/1968  . Smokeless tobacco: Former Systems developer     Comment: Pt quit Cigarettes 1970's- and cigars & chew 1990  . Alcohol Use: 3.6 oz/week    6 Cans of beer per week    Review of Systems  Constitutional: Negative for fever.  HENT: Negative for nosebleeds.   Eyes: Negative for pain.  Respiratory: Negative for shortness of breath.   Cardiovascular: Negative for chest pain.  Gastrointestinal: Negative for nausea, vomiting, abdominal pain and blood in stool.  Genitourinary: Negative for flank pain.  Musculoskeletal: Negative for back pain and neck pain.  Skin: Positive for wound.  Neurological: Negative for syncope, weakness, numbness and headaches.  Hematological: Bruises/bleeds easily.  Psychiatric/Behavioral: Negative for confusion.      Allergies  Review of patient's allergies indicates no known allergies.  Home Medications   Prior to Admission medications   Medication Sig Start Date End Date Taking? Authorizing Provider  anagrelide (AGRYLIN) 0.5 MG capsule Take 1 capsule (0.5 mg total) by mouth 2 (two) times daily. 08/30/13   Domenic Polite, MD  aspirin 325 MG tablet Take 1 tablet (325 mg total) by mouth daily. 12/04/13   Donne Hazel, MD  atorvastatin (LIPITOR) 80 MG tablet Take 1 tablet (80 mg total) by mouth daily at 6 PM. 08/17/13   Geradine Girt, DO  benzonatate (TESSALON) 100 MG capsule Take 100 mg by mouth 3 (three) times daily as needed for cough.    Historical Provider, MD  carvedilol (COREG) 3.125 MG tablet Take 1 tablet (3.125 mg total) by mouth 2 (two) times daily with a meal. 12/04/13    Donne Hazel, MD  ceFEPIme 1 g in dextrose 5 % 50 mL Inject 1 g into the vein every 8 (eight) hours. 12/04/13   Donne Hazel, MD  chlorhexidine (PERIDEX) 0.12 % solution Use as directed 15 mLs in the mouth or throat daily.    Historical Provider, MD  chlorpheniramine-HYDROcodone (TUSSIONEX) 10-8 MG/5ML LQCR Take 5 mLs by mouth every 12 (twelve) hours as needed for cough. 08/17/13   Geradine Girt, DO  clopidogrel (PLAVIX) 75 MG tablet Take 75 mg by mouth daily at 6 PM.    Historical Provider, MD  colchicine 0.6 MG tablet Take 0.6 mg by mouth daily at 6 PM.    Historical Provider, MD  feeding supplement, ENSURE COMPLETE, (ENSURE COMPLETE) LIQD Take 237 mLs by mouth 2 (two) times daily between meals. 08/17/13   Geradine Girt, DO  folic acid (FOLVITE) 1 MG tablet Take 1 mg by mouth daily.    Historical Provider, MD  furosemide (LASIX) 20 MG tablet Take 3 tablets (60 mg total) by mouth 2 (two) times daily. 12/04/13   Donne Hazel, MD  lisinopril (PRINIVIL,ZESTRIL) 5 MG tablet Take 1 tablet (5 mg total) by mouth daily. 08/17/13   Geradine Girt, DO  Multiple Vitamins-Minerals (MULTIVITAMIN WITH MINERALS) tablet Take 1 tablet by mouth daily.    Historical Provider, MD  nitroGLYCERIN (NITROSTAT) 0.4 MG SL tablet Place 0.4 mg under the tongue every 5 (five) minutes as needed for chest pain.    Historical Provider, MD  oxyCODONE-acetaminophen (PERCOCET/ROXICET) 5-325 MG per tablet Take 1 tablet by mouth every 4 (four) hours as needed for severe pain. 12/04/13   Donne Hazel, MD  saccharomyces boulardii (FLORASTOR) 250 MG capsule Take 1 capsule (250 mg total) by mouth 2 (two) times daily. 12/04/13   Donne Hazel, MD  SANTYL ointment Apply 1 application topically daily as needed. 10/10/13   Historical Provider, MD  senna-docusate (SENOKOT-S) 8.6-50 MG per tablet Take 1 tablet by mouth at bedtime. 12/04/13   Donne Hazel, MD  spironolactone (ALDACTONE) 25 MG tablet Take 1 tablet (25 mg total) by mouth daily.  12/04/13   Donne Hazel, MD  thiamine 100 MG tablet Take 1 tablet (100 mg total) by mouth daily. 08/17/13   Geradine Girt, DO   BP 119/77 mmHg  Pulse 95  Temp(Src) 97.6 F (36.4 C) (Oral)  Resp 18  SpO2 99% Physical Exam  Constitutional: He is oriented to person, place, and time. He appears well-developed and well-nourished. No distress.  HENT:  Mouth/Throat: Oropharynx is clear and moist.  Abrasion to bridge of nose. Abrasions to forehead. No epistaxis, no septal hematoma. Facial bones/orbits grossly intact.   Eyes: Conjunctivae are normal. Pupils are equal, round, and reactive to light.  Neck: Normal range of  motion. Neck supple. No tracheal deviation present.  Cardiovascular: Normal rate, regular rhythm, normal heart sounds and intact distal pulses.   Pulmonary/Chest: Effort normal and breath sounds normal. No accessory muscle usage. No respiratory distress. He exhibits no tenderness.  Abdominal: Soft. Bowel sounds are normal. He exhibits no distension. There is no tenderness.  Musculoskeletal: Normal range of motion. He exhibits no tenderness.  Bilateral leg edema, symmetric, pt states improved from prior.  Good rom bil ext without pain or focal bony tenderness. CTLS spine, non tender, aligned, no step off.   Neurological: He is alert and oriented to person, place, and time.  Motor intact bil, makes purposeful movement bil ext with good strength.   Skin: Skin is warm and dry. He is not diaphoretic.  Psychiatric: He has a normal mood and affect.  Nursing note and vitals reviewed.   ED Course  Procedures (including critical care time) Labs Review  Results for orders placed or performed during the hospital encounter of 11/15/13  MRSA PCR Screening  Result Value Ref Range   MRSA by PCR POSITIVE (A) NEGATIVE  Culture, blood (routine x 2)  Result Value Ref Range   Specimen Description BLOOD LEFT ARM    Special Requests BOTTLES DRAWN AEROBIC ONLY [place]    Culture  Setup Time       11/16/2013 04:13 Performed at [place] 5 DAYS Performed at [facility]   Report Status 11/22/2013 FINAL   Urine culture  Result Value Ref Range   Specimen Description URINE, CATHETERIZED    Special Requests NONE    Culture  Setup Time      11/16/2013 00:55 Performed at [place]      3,000 COLONIES/ML Performed at [facility]   Culture      INSIGNIFICANT GROWTH Performed at [facility]   Report Status 11/17/2013 FINAL   Culture, respiratory (tracheal aspirate)  Result Value Ref Range   Specimen Description TRACHEAL ASPIRATE    Special Requests NONE    Gram Stain      MODERATE WBC PRESENT,BOTH PMN AND MONONUCLEAR RARE SQUAMOUS EPITHELIAL CELLS PRESENT RARE GRAM POSITIVE COCCI IN PAIRS RARE GRAM POSITIVE RODS Performed at [facility]   Culture      Non-Pathogenic Oropharyngeal-type [place] Isolated. Performed at [facility]   Report Status 11/18/2013 FINAL   Culture, blood (routine x 2)  Result Value Ref Range   Specimen Description BLOOD LEFT ANTECUBITAL    Special Requests BOTTLES DRAWN AEROBIC ONLY 4CC    Culture  Setup Time      11/16/2013 14:27 Performed at [place] 5 DAYS Performed at [facility]   Report Status 11/22/2013 FINAL   Urine culture  Result Value Ref Range   Specimen Description URINE, CATHETERIZED    Special Requests NONE    Culture  Setup Time      11/25/2013 19:29 Performed at [place]      >=100,000 COLONIES/ML Performed at [place] Performed at [facility]   Report Status 11/30/2013 FINAL    Organism ID, Bacteria PSEUDOMONAS AERUGINOSA    Organism ID, Bacteria ESCHERICHIA COLI       Susceptibility   Escherichia coli - MIC*  AMPICILLIN >=32 RESISTANT Resistant     CEFAZOLIN <=4  SENSITIVE Sensitive     CEFTRIAXONE <=1 SENSITIVE Sensitive     CIPROFLOXACIN >=4 RESISTANT Resistant     GENTAMICIN <=1 SENSITIVE Sensitive     LEVOFLOXACIN >=8 RESISTANT Resistant     NITROFURANTOIN <=16 SENSITIVE Sensitive     TOBRAMYCIN <=1 SENSITIVE Sensitive     TRIMETH/SULFA <=20 SENSITIVE Sensitive     PIP/TAZO <=4 SENSITIVE Sensitive     * ESCHERICHIA COLI   Pseudomonas aeruginosa - MIC*    CEFEPIME 2 SENSITIVE Sensitive     CEFTAZIDIME 4 SENSITIVE Sensitive     CIPROFLOXACIN >=4 RESISTANT Resistant     GENTAMICIN <=1 SENSITIVE Sensitive     IMIPENEM 2 SENSITIVE Sensitive     PIP/TAZO 8 SENSITIVE Sensitive     TOBRAMYCIN* <=1 SENSITIVE Sensitive      * SET UP TIME:  [id number]    * PSEUDOMONAS AERUGINOSA  Surgical pcr screen  Result Value Ref Range   MRSA, PCR POSITIVE (A) NEGATIVE   Staphylococcus aureus POSITIVE (A) NEGATIVE  Glucose, capillary  Result Value Ref Range   Glucose-Capillary 188 (H) 70 - 99 mg/dL   Comment 1 Notify RN   Lactic acid, plasma  Result Value Ref Range   Lactic Acid, Venous 2.4 (H) 0.5 - 2.2 mmol/L  Urinalysis, Routine w reflex microscopic  Result Value Ref Range   Color, Urine RED (A) YELLOW   APPearance TURBID (A) CLEAR   Specific Gravity, Urine 1.024 1.005 - 1.030   pH 5.0 5.0 - 8.0   Glucose, UA 100 (A) NEGATIVE mg/dL   Hgb urine dipstick LARGE (A) NEGATIVE   Bilirubin Urine MODERATE (A) NEGATIVE   Ketones, ur 15 (A) NEGATIVE mg/dL   Protein, ur >300 (A) NEGATIVE mg/dL   Urobilinogen, UA 0.2 0.0 - 1.0 mg/dL   Nitrite NEGATIVE NEGATIVE   Leukocytes, UA SMALL (A) NEGATIVE  CBC  Result Value Ref Range   WBC 44.0 (H) 4.0 - 10.5 K/uL   RBC 5.55 4.22 - 5.81 MIL/uL   Hemoglobin 15.9 13.0 - 17.0 g/dL   HCT 48.3 39.0 - 52.0 %   MCV 87.0 78.0 - 100.0 fL   MCH 28.6 26.0 - 34.0 pg   MCHC 32.9 30.0 - 36.0 g/dL   RDW 17.9 (H) 11.5 - 15.5 %   Platelets 763 (H) 150 - 400 K/uL  Basic metabolic panel  Result Value Ref Range   Sodium 137  137 - 147 mEq/L   Potassium 3.8 3.7 - 5.3 mEq/L   Chloride 99 96 - 112 mEq/L   CO2 18 (L) 19 - 32 mEq/L   Glucose, Bld 182 (H) 70 - 99 mg/dL   BUN 55 (H) 6 - 23 mg/dL   Creatinine, Ser 3.13 (H) 0.50 - 1.35 mg/dL   Calcium 7.6 (L) 8.4 - 10.5 mg/dL   GFR calc non Af Amer 20 (L) >90 mL/min   GFR calc Af Amer 23 (L) >90 mL/min   Anion gap 20 (H) 5 - 15  Amylase  Result Value Ref Range   Amylase 151 (H) 0 - 105 U/L  CBC WITH DIFFERENTIAL  Result Value Ref Range   WBC 47.1 (H) 4.0 - 10.5 K/uL   RBC 5.34 4.22 - 5.81 MIL/uL   Hemoglobin 15.6 13.0 - 17.0 g/dL   HCT 48.8 39.0 - 52.0 %   MCV 91.4 78.0 - 100.0 fL   MCH 29.2 26.0 - 34.0 pg   MCHC 32.0  30.0 - 36.0 g/dL   RDW 18.4 (H) 11.5 - 15.5 %   Platelets 829 (H) 150 - 400 K/uL   Neutrophils Relative % 96 (H) 43 - 77 %   Lymphocytes Relative 2 (L) 12 - 46 %   Monocytes Relative 2 (L) 3 - 12 %   Eosinophils Relative 0 0 - 5 %   Basophils Relative 0 0 - 1 %   Neutro Abs 45.3 (H) 1.7 - 7.7 K/uL   Lymphs Abs 0.9 0.7 - 4.0 K/uL   Monocytes Absolute 0.9 0.1 - 1.0 K/uL   Eosinophils Absolute 0.0 0.0 - 0.7 K/uL   Basophils Absolute 0.0 0.0 - 0.1 K/uL   Smear Review LARGE PLATELETS PRESENT   Comprehensive metabolic panel  Result Value Ref Range   Sodium 137 137 - 147 mEq/L   Potassium 4.0 3.7 - 5.3 mEq/L   Chloride 99 96 - 112 mEq/L   CO2 18 (L) 19 - 32 mEq/L   Glucose, Bld 229 (H) 70 - 99 mg/dL   BUN 51 (H) 6 - 23 mg/dL   Creatinine, Ser 3.42 (H) 0.50 - 1.35 mg/dL   Calcium 7.5 (L) 8.4 - 10.5 mg/dL   Total Protein 5.1 (L) 6.0 - 8.3 g/dL   Albumin 2.4 (L) 3.5 - 5.2 g/dL   AST 19 0 - 37 U/L   ALT 14 0 - 53 U/L   Alkaline Phosphatase 118 (H) 39 - 117 U/L   Total Bilirubin 1.0 0.3 - 1.2 mg/dL   GFR calc non Af Amer 18 (L) >90 mL/min   GFR calc Af Amer 20 (L) >90 mL/min   Anion gap 20 (H) 5 - 15  Cortisol  Result Value Ref Range   Cortisol, Plasma 53.8 ug/dL  Lipase, blood  Result Value Ref Range   Lipase 7 (L) 11 - 59 U/L  Pro  b natriuretic peptide (BNP)  Result Value Ref Range   Pro B Natriuretic peptide (BNP) 9486.0 (H) 0 - 125 pg/mL  Procalcitonin  Result Value Ref Range   Procalcitonin 0.26 ng/mL  Protime-INR  Result Value Ref Range   Prothrombin Time 19.0 (H) 11.6 - 15.2 seconds   INR 1.59 (H) 0.00 - 1.49  Troponin I  Result Value Ref Range   Troponin I 0.75 (HH) <0.30 ng/mL  TSH  Result Value Ref Range   TSH 2.570 0.350 - 4.500 uIU/mL  Hepatic function panel  Result Value Ref Range   Total Protein 5.3 (L) 6.0 - 8.3 g/dL   Albumin 2.5 (L) 3.5 - 5.2 g/dL   AST 20 0 - 37 U/L   ALT 13 0 - 53 U/L   Alkaline Phosphatase 113 39 - 117 U/L   Total Bilirubin 0.9 0.3 - 1.2 mg/dL   Bilirubin, Direct 0.5 (H) 0.0 - 0.3 mg/dL   Indirect Bilirubin 0.4 0.3 - 0.9 mg/dL  Blood gas, arterial  Result Value Ref Range   FIO2 80.00 %   Delivery systems VENTILATOR    Mode PRESSURE REGULATED VOLUME CONTROL    VT 450 mL   Rate 20 resp/min   Peep/cpap 5.0 cm H20   pH, Arterial 7.371 7.350 - 7.450   pCO2 arterial 32.7 (L) 35.0 - 45.0 mmHg   pO2, Arterial 115.0 (H) 80.0 - 100.0 mmHg   Bicarbonate 18.5 (L) 20.0 - 24.0 mEq/L   TCO2 19.5 0 - 100 mmol/L   Acid-base deficit 5.8 (H) 0.0 - 2.0 mmol/L   O2 Saturation 98.0 %  Patient temperature 98.6    Collection site ARTERIAL LINE    Sample type ARTERIAL   Urine microscopic-add on  Result Value Ref Range   Squamous Epithelial / LPF RARE RARE   WBC, UA 3-6 <3 WBC/hpf   RBC / HPF 21-50 <3 RBC/hpf   Bacteria, UA MANY (A) RARE   Casts HYALINE CASTS (A) NEGATIVE   Urine-Other MICROSCOPIC EXAM PERFORMED ON UNCONCENTRATED URINE   Glucose, capillary  Result Value Ref Range   Glucose-Capillary 147 (H) 70 - 99 mg/dL   Comment 1 Notify RN   Glucose, capillary  Result Value Ref Range   Glucose-Capillary 151 (H) 70 - 99 mg/dL  Glucose, capillary  Result Value Ref Range   Glucose-Capillary 131 (H) 70 - 99 mg/dL  Troponin I (q 6hr x 3)  Result Value Ref Range   Troponin  I 2.63 (HH) <0.30 ng/mL  Troponin I (q 6hr x 3)  Result Value Ref Range   Troponin I 2.07 (HH) <0.30 ng/mL  Troponin I (q 6hr x 3)  Result Value Ref Range   Troponin I 1.98 (HH) <0.30 ng/mL  Carboxyhemoglobin  Result Value Ref Range   Total hemoglobin 15.7 13.5 - 18.0 g/dL   O2 Saturation 60.1 %   Carboxyhemoglobin 0.8 0.5 - 1.5 %   Methemoglobin 0.8 0.0 - 1.5 %  Glucose, capillary  Result Value Ref Range   Glucose-Capillary 112 (H) 70 - 99 mg/dL  Glucose, capillary  Result Value Ref Range   Glucose-Capillary 138 (H) 70 - 99 mg/dL  Glucose, capillary  Result Value Ref Range   Glucose-Capillary 125 (H) 70 - 99 mg/dL  Glucose, capillary  Result Value Ref Range   Glucose-Capillary 133 (H) 70 - 99 mg/dL   Comment 1 Documented in Chart    Comment 2 Notify RN   Comprehensive metabolic panel  Result Value Ref Range   Sodium 138 137 - 147 mEq/L   Potassium 3.2 (L) 3.7 - 5.3 mEq/L   Chloride 100 96 - 112 mEq/L   CO2 21 19 - 32 mEq/L   Glucose, Bld 133 (H) 70 - 99 mg/dL   BUN 59 (H) 6 - 23 mg/dL   Creatinine, Ser 2.51 (H) 0.50 - 1.35 mg/dL   Calcium 7.8 (L) 8.4 - 10.5 mg/dL   Total Protein 4.9 (L) 6.0 - 8.3 g/dL   Albumin 2.4 (L) 3.5 - 5.2 g/dL   AST 12 0 - 37 U/L   ALT 14 0 - 53 U/L   Alkaline Phosphatase 87 39 - 117 U/L   Total Bilirubin 0.9 0.3 - 1.2 mg/dL   GFR calc non Af Amer 25 (L) >90 mL/min   GFR calc Af Amer 30 (L) >90 mL/min   Anion gap 17 (H) 5 - 15  CBC with Differential  Result Value Ref Range   WBC 24.6 (H) 4.0 - 10.5 K/uL   RBC 4.96 4.22 - 5.81 MIL/uL   Hemoglobin 14.1 13.0 - 17.0 g/dL   HCT 43.8 39.0 - 52.0 %   MCV 88.3 78.0 - 100.0 fL   MCH 28.4 26.0 - 34.0 pg   MCHC 32.2 30.0 - 36.0 g/dL   RDW 17.9 (H) 11.5 - 15.5 %   Platelets 457 (H) 150 - 400 K/uL   Neutrophils Relative % 95 (H) 43 - 77 %   Neutro Abs 23.4 (H) 1.7 - 7.7 K/uL   Lymphocytes Relative 3 (L) 12 - 46 %   Lymphs Abs 0.7 0.7 - 4.0 K/uL  Monocytes Relative 2 (L) 3 - 12 %   Monocytes  Absolute 0.5 0.1 - 1.0 K/uL   Eosinophils Relative 0 0 - 5 %   Eosinophils Absolute 0.0 0.0 - 0.7 K/uL   Basophils Relative 0 0 - 1 %   Basophils Absolute 0.0 0.0 - 0.1 K/uL  Glucose, capillary  Result Value Ref Range   Glucose-Capillary 136 (H) 70 - 99 mg/dL  Glucose, capillary  Result Value Ref Range   Glucose-Capillary 135 (H) 70 - 99 mg/dL  CBC  Result Value Ref Range   WBC 23.5 (H) 4.0 - 10.5 K/uL   RBC 4.96 4.22 - 5.81 MIL/uL   Hemoglobin 13.9 13.0 - 17.0 g/dL   HCT 44.2 39.0 - 52.0 %   MCV 89.1 78.0 - 100.0 fL   MCH 28.0 26.0 - 34.0 pg   MCHC 31.4 30.0 - 36.0 g/dL   RDW 18.0 (H) 11.5 - 15.5 %   Platelets 413 (H) 150 - 400 K/uL  Basic metabolic panel  Result Value Ref Range   Sodium 139 137 - 147 mEq/L   Potassium 3.3 (L) 3.7 - 5.3 mEq/L   Chloride 104 96 - 112 mEq/L   CO2 22 19 - 32 mEq/L   Glucose, Bld 125 (H) 70 - 99 mg/dL   BUN 56 (H) 6 - 23 mg/dL   Creatinine, Ser 1.97 (H) 0.50 - 1.35 mg/dL   Calcium 7.8 (L) 8.4 - 10.5 mg/dL   GFR calc non Af Amer 34 (L) >90 mL/min   GFR calc Af Amer 40 (L) >90 mL/min   Anion gap 13 5 - 15  Glucose, capillary  Result Value Ref Range   Glucose-Capillary 114 (H) 70 - 99 mg/dL  Glucose, capillary  Result Value Ref Range   Glucose-Capillary 134 (H) 70 - 99 mg/dL  Glucose, capillary  Result Value Ref Range   Glucose-Capillary 139 (H) 70 - 99 mg/dL   Comment 1 Notify RN   Glucose, capillary  Result Value Ref Range   Glucose-Capillary 75 70 - 99 mg/dL  Glucose, capillary  Result Value Ref Range   Glucose-Capillary 115 (H) 70 - 99 mg/dL  Glucose, capillary  Result Value Ref Range   Glucose-Capillary 112 (H) 70 - 99 mg/dL  Glucose, capillary  Result Value Ref Range   Glucose-Capillary 116 (H) 70 - 99 mg/dL  Glucose, capillary  Result Value Ref Range   Glucose-Capillary 137 (H) 70 - 99 mg/dL  CBC  Result Value Ref Range   WBC 19.9 (H) 4.0 - 10.5 K/uL   RBC 5.13 4.22 - 5.81 MIL/uL   Hemoglobin 14.4 13.0 - 17.0 g/dL    HCT 45.2 39.0 - 52.0 %   MCV 88.1 78.0 - 100.0 fL   MCH 28.1 26.0 - 34.0 pg   MCHC 31.9 30.0 - 36.0 g/dL   RDW 18.1 (H) 11.5 - 15.5 %   Platelets 324 150 - 400 K/uL  Basic metabolic panel  Result Value Ref Range   Sodium 142 137 - 147 mEq/L   Potassium 3.7 3.7 - 5.3 mEq/L   Chloride 107 96 - 112 mEq/L   CO2 22 19 - 32 mEq/L   Glucose, Bld 109 (H) 70 - 99 mg/dL   BUN 52 (H) 6 - 23 mg/dL   Creatinine, Ser 1.59 (H) 0.50 - 1.35 mg/dL   Calcium 8.2 (L) 8.4 - 10.5 mg/dL   GFR calc non Af Amer 44 (L) >90 mL/min   GFR calc Af Amer 51 (  L) >90 mL/min   Anion gap 13 5 - 15  Vancomycin, random  Result Value Ref Range   Vancomycin Rm 13.9 ug/mL  Glucose, capillary  Result Value Ref Range   Glucose-Capillary 138 (H) 70 - 99 mg/dL   Comment 1 Notify RN   Glucose, capillary  Result Value Ref Range   Glucose-Capillary 111 (H) 70 - 99 mg/dL  Glucose, capillary  Result Value Ref Range   Glucose-Capillary 108 (H) 70 - 99 mg/dL   Comment 1 Notify RN   Glucose, capillary  Result Value Ref Range   Glucose-Capillary 116 (H) 70 - 99 mg/dL  Glucose, capillary  Result Value Ref Range   Glucose-Capillary 137 (H) 70 - 99 mg/dL  Glucose, capillary  Result Value Ref Range   Glucose-Capillary 94 70 - 99 mg/dL  CBC  Result Value Ref Range   WBC 13.8 (H) 4.0 - 10.5 K/uL   RBC 4.92 4.22 - 5.81 MIL/uL   Hemoglobin 14.0 13.0 - 17.0 g/dL   HCT 44.2 39.0 - 52.0 %   MCV 89.8 78.0 - 100.0 fL   MCH 28.5 26.0 - 34.0 pg   MCHC 31.7 30.0 - 36.0 g/dL   RDW 18.2 (H) 11.5 - 15.5 %   Platelets 199 150 - 400 K/uL  Comprehensive metabolic panel  Result Value Ref Range   Sodium 144 137 - 147 mEq/L   Potassium 3.4 (L) 3.7 - 5.3 mEq/L   Chloride 109 96 - 112 mEq/L   CO2 24 19 - 32 mEq/L   Glucose, Bld 86 70 - 99 mg/dL   BUN 47 (H) 6 - 23 mg/dL   Creatinine, Ser 1.24 0.50 - 1.35 mg/dL   Calcium 8.2 (L) 8.4 - 10.5 mg/dL   Total Protein 5.0 (L) 6.0 - 8.3 g/dL   Albumin 2.4 (L) 3.5 - 5.2 g/dL   AST 8 0 - 37  U/L   ALT 9 0 - 53 U/L   Alkaline Phosphatase 102 39 - 117 U/L   Total Bilirubin 0.6 0.3 - 1.2 mg/dL   GFR calc non Af Amer 60 (L) >90 mL/min   GFR calc Af Amer 69 (L) >90 mL/min   Anion gap 11 5 - 15  Procalcitonin  Result Value Ref Range   Procalcitonin 0.25 ng/mL  Glucose, capillary  Result Value Ref Range   Glucose-Capillary 91 70 - 99 mg/dL  Glucose, capillary  Result Value Ref Range   Glucose-Capillary 97 70 - 99 mg/dL  Glucose, capillary  Result Value Ref Range   Glucose-Capillary 68 (L) 70 - 99 mg/dL  Basic metabolic panel  Result Value Ref Range   Sodium 144 137 - 147 mEq/L   Potassium 3.9 3.7 - 5.3 mEq/L   Chloride 110 96 - 112 mEq/L   CO2 25 19 - 32 mEq/L   Glucose, Bld 86 70 - 99 mg/dL   BUN 38 (H) 6 - 23 mg/dL   Creatinine, Ser 0.96 0.50 - 1.35 mg/dL   Calcium 8.1 (L) 8.4 - 10.5 mg/dL   GFR calc non Af Amer 86 (L) >90 mL/min   GFR calc Af Amer >90 >90 mL/min   Anion gap 9 5 - 15  CBC  Result Value Ref Range   WBC 14.2 (H) 4.0 - 10.5 K/uL   RBC 4.87 4.22 - 5.81 MIL/uL   Hemoglobin 13.7 13.0 - 17.0 g/dL   HCT 43.9 39.0 - 52.0 %   MCV 90.1 78.0 - 100.0 fL   MCH 28.1  26.0 - 34.0 pg   MCHC 31.2 30.0 - 36.0 g/dL   RDW 18.4 (H) 11.5 - 15.5 %   Platelets 174 150 - 400 K/uL  Glucose, capillary  Result Value Ref Range   Glucose-Capillary 83 70 - 99 mg/dL  Carboxyhemoglobin  Result Value Ref Range   Total hemoglobin 13.9 13.5 - 18.0 g/dL   O2 Saturation 73.6 %   Carboxyhemoglobin 1.3 0.5 - 1.5 %   Methemoglobin 0.7 0.0 - 1.5 %  Carboxyhemoglobin  Result Value Ref Range   Total hemoglobin 13.7 13.5 - 18.0 g/dL   O2 Saturation 77.1 %   Carboxyhemoglobin 1.3 0.5 - 1.5 %   Methemoglobin 0.7 0.0 - 1.5 %  Carboxyhemoglobin  Result Value Ref Range   Total hemoglobin 14.1 13.5 - 18.0 g/dL   O2 Saturation 74.8 %   Carboxyhemoglobin 1.6 (H) 0.5 - 1.5 %   Methemoglobin 0.8 0.0 - 1.5 %  Basic metabolic panel  Result Value Ref Range   Sodium 142 137 - 147 mEq/L    Potassium 3.8 3.7 - 5.3 mEq/L   Chloride 102 96 - 112 mEq/L   CO2 29 19 - 32 mEq/L   Glucose, Bld 76 70 - 99 mg/dL   BUN 30 (H) 6 - 23 mg/dL   Creatinine, Ser 0.86 0.50 - 1.35 mg/dL   Calcium 8.5 8.4 - 10.5 mg/dL   GFR calc non Af Amer 90 (L) >90 mL/min   GFR calc Af Amer >90 >90 mL/min   Anion gap 11 5 - 15  CBC  Result Value Ref Range   WBC 14.9 (H) 4.0 - 10.5 K/uL   RBC 4.99 4.22 - 5.81 MIL/uL   Hemoglobin 14.0 13.0 - 17.0 g/dL   HCT 44.1 39.0 - 52.0 %   MCV 88.4 78.0 - 100.0 fL   MCH 28.1 26.0 - 34.0 pg   MCHC 31.7 30.0 - 36.0 g/dL   RDW 18.2 (H) 11.5 - 15.5 %   Platelets 163 150 - 400 K/uL  Carboxyhemoglobin  Result Value Ref Range   Total hemoglobin 14.8 13.5 - 18.0 g/dL   O2 Saturation 68.1 %   Carboxyhemoglobin 1.9 (H) 0.5 - 1.5 %   Methemoglobin 0.5 0.0 - 1.5 %  Basic metabolic panel  Result Value Ref Range   Sodium 143 137 - 147 mEq/L   Potassium 4.1 3.7 - 5.3 mEq/L   Chloride 95 (L) 96 - 112 mEq/L   CO2 37 (H) 19 - 32 mEq/L   Glucose, Bld 81 70 - 99 mg/dL   BUN 25 (H) 6 - 23 mg/dL   Creatinine, Ser 0.83 0.50 - 1.35 mg/dL   Calcium 9.0 8.4 - 10.5 mg/dL   GFR calc non Af Amer >90 >90 mL/min   GFR calc Af Amer >90 >90 mL/min   Anion gap 11 5 - 15  Magnesium  Result Value Ref Range   Magnesium 1.4 (L) 1.5 - 2.5 mg/dL  Carboxyhemoglobin  Result Value Ref Range   Total hemoglobin 15.3 13.5 - 18.0 g/dL   O2 Saturation 81.6 %   Carboxyhemoglobin 2.0 (H) 0.5 - 1.5 %   Methemoglobin 0.6 0.0 - 1.5 %  Magnesium  Result Value Ref Range   Magnesium 1.5 1.5 - 2.5 mg/dL  Basic metabolic panel  Result Value Ref Range   Sodium 141 137 - 147 mEq/L   Potassium 4.2 3.7 - 5.3 mEq/L   Chloride 91 (L) 96 - 112 mEq/L   CO2 44 (HH) 19 -  32 mEq/L   Glucose, Bld 86 70 - 99 mg/dL   BUN 21 6 - 23 mg/dL   Creatinine, Ser 0.80 0.50 - 1.35 mg/dL   Calcium 9.3 8.4 - 10.5 mg/dL   GFR calc non Af Amer >90 >90 mL/min   GFR calc Af Amer >90 >90 mL/min   Anion gap 6 5 - 15   Glucose, capillary  Result Value Ref Range   Glucose-Capillary 88 70 - 99 mg/dL  Basic metabolic panel  Result Value Ref Range   Sodium 136 (L) 137 - 147 mEq/L   Potassium 4.7 3.7 - 5.3 mEq/L   Chloride 86 (L) 96 - 112 mEq/L   CO2 44 (HH) 19 - 32 mEq/L   Glucose, Bld 85 70 - 99 mg/dL   BUN 21 6 - 23 mg/dL   Creatinine, Ser 0.83 0.50 - 1.35 mg/dL   Calcium 9.3 8.4 - 10.5 mg/dL   GFR calc non Af Amer >90 >90 mL/min   GFR calc Af Amer >90 >90 mL/min   Anion gap 6 5 - 15  Magnesium  Result Value Ref Range   Magnesium 2.2 1.5 - 2.5 mg/dL  Urinalysis, Routine w reflex microscopic  Result Value Ref Range   Color, Urine AMBER (A) YELLOW   APPearance CLOUDY (A) CLEAR   Specific Gravity, Urine 1.013 1.005 - 1.030   pH 8.5 (H) 5.0 - 8.0   Glucose, UA NEGATIVE NEGATIVE mg/dL   Hgb urine dipstick LARGE (A) NEGATIVE   Bilirubin Urine NEGATIVE NEGATIVE   Ketones, ur NEGATIVE NEGATIVE mg/dL   Protein, ur 30 (A) NEGATIVE mg/dL   Urobilinogen, UA 0.2 0.0 - 1.0 mg/dL   Nitrite NEGATIVE NEGATIVE   Leukocytes, UA LARGE (A) NEGATIVE  CBC with Differential  Result Value Ref Range   WBC 18.0 (H) 4.0 - 10.5 K/uL   RBC 5.33 4.22 - 5.81 MIL/uL   Hemoglobin 15.1 13.0 - 17.0 g/dL   HCT 48.5 39.0 - 52.0 %   MCV 91.0 78.0 - 100.0 fL   MCH 28.3 26.0 - 34.0 pg   MCHC 31.1 30.0 - 36.0 g/dL   RDW 17.8 (H) 11.5 - 15.5 %   Platelets 405 (H) 150 - 400 K/uL   Neutrophils Relative % 90 (H) 43 - 77 %   Neutro Abs 16.1 (H) 1.7 - 7.7 K/uL   Lymphocytes Relative 6 (L) 12 - 46 %   Lymphs Abs 1.0 0.7 - 4.0 K/uL   Monocytes Relative 2 (L) 3 - 12 %   Monocytes Absolute 0.4 0.1 - 1.0 K/uL   Eosinophils Relative 2 0 - 5 %   Eosinophils Absolute 0.4 0.0 - 0.7 K/uL   Basophils Relative 0 0 - 1 %   Basophils Absolute 0.0 0.0 - 0.1 K/uL  Urine microscopic-add on  Result Value Ref Range   Squamous Epithelial / LPF RARE RARE   WBC, UA TOO NUMEROUS TO COUNT <3 WBC/hpf   RBC / HPF 21-50 <3 RBC/hpf   Bacteria, UA  RARE RARE   Urine-Other FEW YEAST   Carboxyhemoglobin  Result Value Ref Range   Total hemoglobin 14.5 13.5 - 18.0 g/dL   O2 Saturation 83.2 %   Carboxyhemoglobin 2.2 (H) 0.5 - 1.5 %   Methemoglobin 0.7 0.0 - 1.5 %  Basic metabolic panel  Result Value Ref Range   Sodium 138 137 - 147 mEq/L   Potassium 5.0 3.7 - 5.3 mEq/L   Chloride 90 (L) 96 - 112 mEq/L  CO2 39 (H) 19 - 32 mEq/L   Glucose, Bld 81 70 - 99 mg/dL   BUN 21 6 - 23 mg/dL   Creatinine, Ser 0.87 0.50 - 1.35 mg/dL   Calcium 9.0 8.4 - 10.5 mg/dL   GFR calc non Af Amer 89 (L) >90 mL/min   GFR calc Af Amer >90 >90 mL/min   Anion gap 9 5 - 15  Magnesium  Result Value Ref Range   Magnesium 2.2 1.5 - 2.5 mg/dL  Basic metabolic panel  Result Value Ref Range   Sodium 139 137 - 147 mEq/L   Potassium 4.4 3.7 - 5.3 mEq/L   Chloride 96 96 - 112 mEq/L   CO2 34 (H) 19 - 32 mEq/L   Glucose, Bld 82 70 - 99 mg/dL   BUN 18 6 - 23 mg/dL   Creatinine, Ser 0.87 0.50 - 1.35 mg/dL   Calcium 9.0 8.4 - 10.5 mg/dL   GFR calc non Af Amer 89 (L) >90 mL/min   GFR calc Af Amer >90 >90 mL/min   Anion gap 9 5 - 15  Magnesium  Result Value Ref Range   Magnesium 2.0 1.5 - 2.5 mg/dL  CBC  Result Value Ref Range   WBC 16.5 (H) 4.0 - 10.5 K/uL   RBC 5.02 4.22 - 5.81 MIL/uL   Hemoglobin 14.1 13.0 - 17.0 g/dL   HCT 45.6 39.0 - 52.0 %   MCV 90.8 78.0 - 100.0 fL   MCH 28.1 26.0 - 34.0 pg   MCHC 30.9 30.0 - 36.0 g/dL   RDW 17.9 (H) 11.5 - 15.5 %   Platelets 505 (H) 150 - 400 K/uL  Carboxyhemoglobin  Result Value Ref Range   Total hemoglobin 14.7 13.5 - 18.0 g/dL   O2 Saturation 93.2 %   Carboxyhemoglobin 2.0 (H) 0.5 - 1.5 %   Methemoglobin 0.6 0.0 - 1.5 %  Magnesium  Result Value Ref Range   Magnesium 2.0 1.5 - 2.5 mg/dL  Basic metabolic panel  Result Value Ref Range   Sodium 136 (L) 137 - 147 mEq/L   Potassium 4.3 3.7 - 5.3 mEq/L   Chloride 94 (L) 96 - 112 mEq/L   CO2 34 (H) 19 - 32 mEq/L   Glucose, Bld 78 70 - 99 mg/dL   BUN 17 6 -  23 mg/dL   Creatinine, Ser 0.84 0.50 - 1.35 mg/dL   Calcium 9.0 8.4 - 10.5 mg/dL   GFR calc non Af Amer >90 >90 mL/min   GFR calc Af Amer >90 >90 mL/min   Anion gap 8 5 - 15  CBC  Result Value Ref Range   WBC 16.3 (H) 4.0 - 10.5 K/uL   RBC 5.02 4.22 - 5.81 MIL/uL   Hemoglobin 14.2 13.0 - 17.0 g/dL   HCT 45.9 39.0 - 52.0 %   MCV 91.4 78.0 - 100.0 fL   MCH 28.3 26.0 - 34.0 pg   MCHC 30.9 30.0 - 36.0 g/dL   RDW 17.9 (H) 11.5 - 15.5 %   Platelets 562 (H) 150 - 400 K/uL  Carboxyhemoglobin  Result Value Ref Range   Total hemoglobin 14.5 13.5 - 18.0 g/dL   O2 Saturation 62.9 %   Carboxyhemoglobin 2.1 (H) 0.5 - 1.5 %   Methemoglobin 0.6 0.0 - 1.5 %  Magnesium  Result Value Ref Range   Magnesium 2.0 1.5 - 2.5 mg/dL  Basic metabolic panel  Result Value Ref Range   Sodium 137 137 - 147 mEq/L  Potassium 4.5 3.7 - 5.3 mEq/L   Chloride 95 (L) 96 - 112 mEq/L   CO2 28 19 - 32 mEq/L   Glucose, Bld 80 70 - 99 mg/dL   BUN 19 6 - 23 mg/dL   Creatinine, Ser 0.90 0.50 - 1.35 mg/dL   Calcium 8.9 8.4 - 10.5 mg/dL   GFR calc non Af Amer 88 (L) >90 mL/min   GFR calc Af Amer >90 >90 mL/min   Anion gap 14 5 - 15  CBC  Result Value Ref Range   WBC 15.4 (H) 4.0 - 10.5 K/uL   RBC 5.12 4.22 - 5.81 MIL/uL   Hemoglobin 14.4 13.0 - 17.0 g/dL   HCT 46.5 39.0 - 52.0 %   MCV 90.8 78.0 - 100.0 fL   MCH 28.1 26.0 - 34.0 pg   MCHC 31.0 30.0 - 36.0 g/dL   RDW 17.8 (H) 11.5 - 15.5 %   Platelets 573 (H) 150 - 400 K/uL  Magnesium  Result Value Ref Range   Magnesium 1.8 1.5 - 2.5 mg/dL  Glucose, capillary  Result Value Ref Range   Glucose-Capillary 104 (H) 70 - 99 mg/dL  Glucose, capillary  Result Value Ref Range   Glucose-Capillary 87 70 - 99 mg/dL  Basic metabolic panel  Result Value Ref Range   Sodium 134 (L) 137 - 147 mEq/L   Potassium 4.7 3.7 - 5.3 mEq/L   Chloride 95 (L) 96 - 112 mEq/L   CO2 26 19 - 32 mEq/L   Glucose, Bld 76 70 - 99 mg/dL   BUN 26 (H) 6 - 23 mg/dL   Creatinine, Ser 1.19  0.50 - 1.35 mg/dL   Calcium 8.8 8.4 - 10.5 mg/dL   GFR calc non Af Amer 63 (L) >90 mL/min   GFR calc Af Amer 73 (L) >90 mL/min   Anion gap 13 5 - 15  CBC  Result Value Ref Range   WBC 16.0 (H) 4.0 - 10.5 K/uL   RBC 4.99 4.22 - 5.81 MIL/uL   Hemoglobin 14.1 13.0 - 17.0 g/dL   HCT 44.9 39.0 - 52.0 %   MCV 90.0 78.0 - 100.0 fL   MCH 28.3 26.0 - 34.0 pg   MCHC 31.4 30.0 - 36.0 g/dL   RDW 17.9 (H) 11.5 - 15.5 %   Platelets 723 (H) 150 - 400 K/uL  Glucose, capillary  Result Value Ref Range   Glucose-Capillary 140 (H) 70 - 99 mg/dL   Comment 1 Documented in Chart    Comment 2 Notify RN   Glucose, capillary  Result Value Ref Range   Glucose-Capillary 99 70 - 99 mg/dL   Comment 1 Documented in Chart    Comment 2 Notify RN   Glucose, capillary  Result Value Ref Range   Glucose-Capillary 97 70 - 99 mg/dL  Glucose, capillary  Result Value Ref Range   Glucose-Capillary 92 70 - 99 mg/dL  Glucose, capillary  Result Value Ref Range   Glucose-Capillary 119 (H) 70 - 99 mg/dL   Comment 1 Notify RN   CBC  Result Value Ref Range   WBC 17.3 (H) 4.0 - 10.5 K/uL   RBC 5.09 4.22 - 5.81 MIL/uL   Hemoglobin 14.5 13.0 - 17.0 g/dL   HCT 45.3 39.0 - 52.0 %   MCV 89.0 78.0 - 100.0 fL   MCH 28.5 26.0 - 34.0 pg   MCHC 32.0 30.0 - 36.0 g/dL   RDW 18.1 (H) 11.5 - 15.5 %   Platelets  689 (H) 150 - 400 K/uL  Basic metabolic panel  Result Value Ref Range   Sodium 135 (L) 137 - 147 mEq/L   Potassium 4.7 3.7 - 5.3 mEq/L   Chloride 97 96 - 112 mEq/L   CO2 25 19 - 32 mEq/L   Glucose, Bld 87 70 - 99 mg/dL   BUN 29 (H) 6 - 23 mg/dL   Creatinine, Ser 1.05 0.50 - 1.35 mg/dL   Calcium 8.8 8.4 - 10.5 mg/dL   GFR calc non Af Amer 73 (L) >90 mL/min   GFR calc Af Amer 85 (L) >90 mL/min   Anion gap 13 5 - 15  I-STAT 3, arterial blood gas (G3+)  Result Value Ref Range   pH, Arterial 7.200 (L) 7.350 - 7.450   pCO2 arterial 47.4 (H) 35.0 - 45.0 mmHg   pO2, Arterial 97.0 80.0 - 100.0 mmHg   Bicarbonate 18.5  (L) 20.0 - 24.0 mEq/L   TCO2 20 0 - 100 mmol/L   O2 Saturation 96.0 %   Acid-base deficit 10.0 (H) 0.0 - 2.0 mmol/L   Patient temperature 98.7 F    Collection site RADIAL, ALLEN'S TEST ACCEPTABLE    Drawn by RT    Sample type ARTERIAL   Type and screen  Result Value Ref Range   ABO/RH(D) A POS    Antibody Screen NEG    Sample Expiration 11/18/2013    Ct Head Wo Contrast  01/17/2014   CLINICAL DATA:  The patient's walker collapsed and he fell face first with laceration to the nose and forehead.  EXAM: CT HEAD WITHOUT CONTRAST  TECHNIQUE: Contiguous axial images were obtained from the base of the skull through the vertex without intravenous contrast.  COMPARISON:  Head CT 02/07/2013  FINDINGS: The brain shows age related atrophy. There is no evidence of acute infarction, intra-axial mass lesion, hemorrhage, hydrocephalus or extra-axial fluid collection. There is atherosclerotic calcification of the major vessels at the base of the brain. No skull fracture. There is probably a calcified cysts I will meningioma at the left parietal vertex which does not have any mass effect in would not be significant. There is soft tissue swelling at the bridge of the nose and forehead.  IMPRESSION: No acute or traumatic cranial finding. Soft tissue swelling at the bridge of the nose and forehead.   Electronically Signed   By: Nelson Chimes M.D.   On: 01/17/2014 22:18      MDM  Wounds cleaned, bacitracin applied, sterile dressings.  Reviewed nursing notes and prior charts for additional history.   Recheck no persistent bleeding.   Recheck spine nt.  No new c/o or new pain. Ct neg acute.  Pt appears stable for d/c.     Mirna Mires, MD 01/17/14 2225

## 2014-01-21 ENCOUNTER — Ambulatory Visit: Payer: Medicare Other | Admitting: Family

## 2014-01-23 ENCOUNTER — Encounter: Payer: Self-pay | Admitting: Family

## 2014-01-23 ENCOUNTER — Ambulatory Visit (INDEPENDENT_AMBULATORY_CARE_PROVIDER_SITE_OTHER): Payer: Medicare Other | Admitting: Family

## 2014-01-23 ENCOUNTER — Other Ambulatory Visit (INDEPENDENT_AMBULATORY_CARE_PROVIDER_SITE_OTHER): Payer: Medicare Other

## 2014-01-23 VITALS — BP 132/88 | HR 83 | Temp 97.5°F | Resp 18 | Ht 66.0 in | Wt 216.1 lb

## 2014-01-23 DIAGNOSIS — I5023 Acute on chronic systolic (congestive) heart failure: Secondary | ICD-10-CM

## 2014-01-23 LAB — CBC
HCT: 44.6 % (ref 39.0–52.0)
Hemoglobin: 13.8 g/dL (ref 13.0–17.0)
MCHC: 31 g/dL (ref 30.0–36.0)
MCV: 86.8 fl (ref 78.0–100.0)
PLATELETS: 875 10*3/uL — AB (ref 150.0–400.0)
RBC: 5.14 Mil/uL (ref 4.22–5.81)
RDW: 17.6 % — ABNORMAL HIGH (ref 11.5–15.5)
WBC: 15.4 10*3/uL — AB (ref 4.0–10.5)

## 2014-01-23 LAB — BASIC METABOLIC PANEL
BUN: 27 mg/dL — AB (ref 6–23)
CO2: 35 meq/L — AB (ref 19–32)
Calcium: 9.6 mg/dL (ref 8.4–10.5)
Chloride: 101 mEq/L (ref 96–112)
Creatinine, Ser: 1 mg/dL (ref 0.4–1.5)
GFR: 80.83 mL/min (ref >60.00)
GLUCOSE: 90 mg/dL (ref 70–99)
POTASSIUM: 4 meq/L (ref 3.5–5.1)
Sodium: 142 mEq/L (ref 135–145)

## 2014-01-23 MED ORDER — FUROSEMIDE 80 MG PO TABS: 80.0000 mg | ORAL_TABLET | Freq: Two times a day (BID) | ORAL | Status: DC

## 2014-01-23 NOTE — Assessment & Plan Note (Addendum)
CHF appears stable. Most likely result of increased fluid. Wound dressed with gauze and pressure dressing. Cover with ace wrap to assist with compression. Obtain CBC and BMET. Increase Lasix for 4 days from 160 daily 240 daily. Once completed return to 160 mg daily. Follow up if symptoms worsen or fail to improve.  Will work on obtaining wheelchair apparatus with ability to elevate lower extremities. Keep follow up with cardiology.

## 2014-01-23 NOTE — Patient Instructions (Signed)
Thank you for choosing [facility].  Summary/Instructions:   Please stop at the lab prior to leaving for blood work  Please take 2 Lasix in the morning and 1 in the evening for the next 4 days. Then return to 1 in the morning and 1 in the evening. May take an additional lasix as needed for edema

## 2014-01-23 NOTE — Progress Notes (Signed)
Subjective:    Patient ID: DA AUTHEMENT, male    DOB: 1951-02-19, 63 y.o.   MRN: [id number]  Chief Complaint  Patient presents with  . Follow-up    from rehab, legs are still pretty swollen and leaking    HPI:  Fernando Lane is a 63 y.o. male who presents today for follow up.   Recently discharged from [facility] where he has been follow 2 myocardial infarctions and diagnosis with heart failure. Does not have discharge paperwork from time in rehabilitation. Hospital course at [facility] was reviewed in detail. He indicates since his discharge he has increased leg swelling and come mild coughing. Denies any current chest pain/discomfort, palpitations or dyspnea. Denies fever or chills or any associated cold like symptoms. Has a healing wound on his right leg and his left leg has been oozing fluid. He is here to follow up per rehabilitation providers instruction. He also received a wheelchair that was supposed to have the ability to elevate his lower extremities.   No Known Allergies  Current Outpatient Prescriptions on File Prior to Visit  Medication Sig Dispense Refill  . acetaminophen (TYLENOL) 500 MG tablet Take 500 mg by mouth 2 (two) times daily.    Marland Kitchen anagrelide (AGRYLIN) 0.5 MG capsule Take 1 capsule (0.5 mg total) by mouth 2 (two) times daily.    Marland Kitchen aspirin 325 MG tablet Take 1 tablet (325 mg total) by mouth daily. 30 tablet 0  . atorvastatin (LIPITOR) 40 MG tablet Take 40 mg by mouth daily.    Marland Kitchen atorvastatin (LIPITOR) 80 MG tablet Take 1 tablet (80 mg total) by mouth daily at 6 PM.    . benzonatate (TESSALON) 100 MG capsule Take 100 mg by mouth 3 (three) times daily as needed for cough.    . carvedilol (COREG) 3.125 MG tablet Take 1 tablet (3.125 mg total) by mouth 2 (two) times daily with a meal. 60 tablet 0  . ceFEPIme 1 g in dextrose 5 % 50 mL Inject 1 g into the vein every 8 (eight) hours.    . chlorhexidine (PERIDEX) 0.12 % solution Use as directed  15 mLs in the mouth or throat daily.    . chlorpheniramine-HYDROcodone (TUSSIONEX) 10-8 MG/5ML LQCR Take 5 mLs by mouth every 12 (twelve) hours as needed for cough.  0  . clopidogrel (PLAVIX) 75 MG tablet Take 75 mg by mouth daily at 6 PM.    . colchicine 0.6 MG tablet Take 0.6 mg by mouth daily at 6 PM.    . docusate sodium (COLACE) 100 MG capsule Take 100 mg by mouth 2 (two) times daily.    . feeding supplement, ENSURE COMPLETE, (ENSURE COMPLETE) LIQD Take 237 mLs by mouth 2 (two) times daily between meals.    . folic acid (FOLVITE) 1 MG tablet Take 1 mg by mouth daily.    Marland Kitchen HYDROcodone-acetaminophen (NORCO) 7.5-325 MG per tablet Take 1 tablet by mouth every 4 (four) hours as needed for moderate pain.    Marland Kitchen lactobacillus acidophilus (BACID) TABS tablet Take 2 tablets by mouth 2 (two) times daily.    Marland Kitchen lisinopril (PRINIVIL,ZESTRIL) 5 MG tablet Take 1 tablet (5 mg total) by mouth daily.    . Multiple Vitamins-Minerals (MULTIVITAMIN WITH MINERALS) tablet Take 1 tablet by mouth daily.    . nitroGLYCERIN (NITROSTAT) 0.4 MG SL tablet Place 0.4 mg under the tongue every 5 (five) minutes as needed for chest pain.    . pregabalin (LYRICA) 50  MG capsule Take 50 mg by mouth 3 (three) times daily.    Marland Kitchen saccharomyces boulardii (FLORASTOR) 250 MG capsule Take 1 capsule (250 mg total) by mouth 2 (two) times daily. 60 capsule 0  . SANTYL ointment Apply 1 application topically daily as needed.    . senna-docusate (SENOKOT-S) 8.6-50 MG per tablet Take 1 tablet by mouth at bedtime. 30 tablet 0  . spironolactone (ALDACTONE) 25 MG tablet Take 1 tablet (25 mg total) by mouth daily. 30 tablet 0  . thiamine 100 MG tablet Take 1 tablet (100 mg total) by mouth daily.     No current facility-administered medications on file prior to visit.   Past Medical History  Diagnosis Date  . Chronic low back pain   . 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 Chronic neck pain   . OSA  (obstructive sleep apnea)     CPAP @ bedtime  . Allergy   . Unspecified essential hypertension     Resistant, 2D Echo - [id number]  . Polycythemia   . CAD (coronary artery disease)     Vessel type unspecified  . PVD (peripheral vascular disease)   . DDD (degenerative disc disease), cervical     Review of Systems    See HPI  Objective:    BP 132/88 mmHg  Pulse 83  Temp(Src) 97.5 F (36.4 C) (Oral)  Resp 18  Ht 5\' 6"  (1.676 m)  Wt 216 lb 1.9 oz (98.031 kg)  BMI 34.90 kg/m2  SpO2 97% Nursing note and vital signs reviewed.  Physical Exam  Constitutional: He is oriented to person, place, and time. He appears well-developed and well-nourished. No distress.  63 year old gentleman seated in a wheelchair, dressed appropriately, appears slightly older than his stated age, in NAD.   Cardiovascular: Normal rate, regular rhythm, normal heart sounds and intact distal pulses.   Obvious edema of bilateral extremities noted. Right leg is covered in ace wrap secondary to open wound. Left leg is edematous with about quarter size open wound on the lateral middle tibia. Serious fluid present left leg. No evidence of infection. Pulses are present and sensation is intact.   Pulmonary/Chest: Effort normal and breath sounds normal.  Neurological: He is alert and oriented to person, place, and time.  Skin: Skin is warm and dry.  Psychiatric: He has a normal mood and affect. His behavior is normal. Judgment and thought content normal.       Assessment & Plan:

## 2014-01-23 NOTE — Progress Notes (Signed)
Pre visit review using our clinic review tool, if applicable. No additional management support is needed unless otherwise documented below in the visit note. 

## 2014-01-24 ENCOUNTER — Telehealth: Payer: Self-pay | Admitting: Family

## 2014-01-24 ENCOUNTER — Telehealth: Payer: Self-pay | Admitting: Internal Medicine

## 2014-01-24 NOTE — Telephone Encounter (Signed)
Pt called in and said that he wanted to switch to greg for PCP and wanted him to fax over a order to [facility] to get legs for wheel chair.     [phone] -[facility]

## 2014-01-24 NOTE — Telephone Encounter (Signed)
Please call the patient and inform him that his electrolytes look good. His platlets are high, which is most likely related to his polycythemia vera. Therefore there are no changes that are needed at this particular time. Also please ask him how the edema in his legs is doing.

## 2014-01-25 ENCOUNTER — Encounter (HOSPITAL_COMMUNITY): Payer: Self-pay | Admitting: Emergency Medicine

## 2014-01-25 ENCOUNTER — Emergency Department (HOSPITAL_COMMUNITY): Payer: Medicare Other

## 2014-01-25 ENCOUNTER — Emergency Department (HOSPITAL_COMMUNITY)
Admission: EM | Admit: 2014-01-25 | Discharge: 2014-01-25 | Disposition: A | Discharge status: Home / Self Care | Payer: Medicare Other | Attending: Emergency Medicine | Admitting: Emergency Medicine

## 2014-01-25 DIAGNOSIS — Z7982 Long term (current) use of aspirin: Secondary | ICD-10-CM | POA: Insufficient documentation

## 2014-01-25 DIAGNOSIS — Y92003 Bedroom of unspecified non-institutional (private) residence as the place of occurrence of the external cause: Secondary | ICD-10-CM | POA: Insufficient documentation

## 2014-01-25 DIAGNOSIS — Z89421 Acquired absence of other right toe(s): Secondary | ICD-10-CM | POA: Diagnosis not present

## 2014-01-25 DIAGNOSIS — S7001XA Contusion of right hip, initial encounter: Secondary | ICD-10-CM | POA: Diagnosis not present

## 2014-01-25 DIAGNOSIS — Z8739 Personal history of other diseases of the musculoskeletal system and connective tissue: Secondary | ICD-10-CM | POA: Insufficient documentation

## 2014-01-25 DIAGNOSIS — Z96652 Presence of left artificial knee joint: Secondary | ICD-10-CM | POA: Insufficient documentation

## 2014-01-25 DIAGNOSIS — W010XXA Fall on same level from slipping, tripping and stumbling without subsequent striking against object, initial encounter: Secondary | ICD-10-CM | POA: Diagnosis not present

## 2014-01-25 DIAGNOSIS — S79911A Unspecified injury of right hip, initial encounter: Secondary | ICD-10-CM | POA: Diagnosis present

## 2014-01-25 DIAGNOSIS — Y9389 Activity, other specified: Secondary | ICD-10-CM | POA: Insufficient documentation

## 2014-01-25 DIAGNOSIS — Z7902 Long term (current) use of antithrombotics/antiplatelets: Secondary | ICD-10-CM | POA: Diagnosis not present

## 2014-01-25 DIAGNOSIS — Z79899 Other long term (current) drug therapy: Secondary | ICD-10-CM | POA: Diagnosis not present

## 2014-01-25 DIAGNOSIS — S90415A Abrasion, left lesser toe(s), initial encounter: Secondary | ICD-10-CM | POA: Diagnosis not present

## 2014-01-25 DIAGNOSIS — I2510 Atherosclerotic heart disease of native coronary artery without angina pectoris: Secondary | ICD-10-CM | POA: Insufficient documentation

## 2014-01-25 DIAGNOSIS — Z9981 Dependence on supplemental oxygen: Secondary | ICD-10-CM | POA: Insufficient documentation

## 2014-01-25 DIAGNOSIS — G8929 Other chronic pain: Secondary | ICD-10-CM | POA: Insufficient documentation

## 2014-01-25 DIAGNOSIS — Z862 Personal history of diseases of the blood and blood-forming organs and certain disorders involving the immune mechanism: Secondary | ICD-10-CM | POA: Diagnosis not present

## 2014-01-25 DIAGNOSIS — S80211A Abrasion, right knee, initial encounter: Secondary | ICD-10-CM | POA: Insufficient documentation

## 2014-01-25 DIAGNOSIS — Y998 Other external cause status: Secondary | ICD-10-CM | POA: Diagnosis not present

## 2014-01-25 DIAGNOSIS — I10 Essential (primary) hypertension: Secondary | ICD-10-CM | POA: Diagnosis not present

## 2014-01-25 DIAGNOSIS — T07XXXA Unspecified multiple injuries, initial encounter: Secondary | ICD-10-CM

## 2014-01-25 DIAGNOSIS — Z87891 Personal history of nicotine dependence: Secondary | ICD-10-CM | POA: Diagnosis not present

## 2014-01-25 DIAGNOSIS — G4733 Obstructive sleep apnea (adult) (pediatric): Secondary | ICD-10-CM | POA: Diagnosis not present

## 2014-01-25 NOTE — Discharge Instructions (Signed)
Use ice on the sore spots for 2 days, after that use heat.  Use Tylenol, or your pain medicine, which you already have, as needed for discomfort.  Follow up with your doctor next week for a checkup.   Abrasion An abrasion is a cut or scrape of the skin. Abrasions do not extend through all layers of the skin and most heal within 10 days. It is important to care for your abrasion properly to prevent infection. CAUSES  Most abrasions are caused by falling on, or gliding across, the ground or other surface. When your skin rubs on something, the outer and inner layer of skin rubs off, causing an abrasion. DIAGNOSIS  Your caregiver will be able to diagnose an abrasion during a physical exam.  TREATMENT  Your treatment depends on how large and deep the abrasion is. Generally, your abrasion will be cleaned with water and a mild soap to remove any dirt or debris. An antibiotic ointment may be put over the abrasion to prevent an infection. A bandage (dressing) may be wrapped around the abrasion to keep it from getting dirty.  You may need a tetanus shot if:  You cannot remember when you had your last tetanus shot.  You have never had a tetanus shot.  The injury broke your skin. If you get a tetanus shot, your arm may swell, get red, and feel warm to the touch. This is common and not a problem. If you need a tetanus shot and you choose not to have one, there is a rare chance of getting tetanus. Sickness from tetanus can be serious.  HOME CARE INSTRUCTIONS   If a dressing was applied, change it at least once a day or as directed by your caregiver. If the bandage sticks, soak it off with warm water.   Wash the area with water and a mild soap to remove all the ointment 2 times a day. Rinse off the soap and pat the area dry with a clean towel.   Reapply any ointment as directed by your caregiver. This will help prevent infection and keep the bandage from sticking. Use gauze over the wound and under  the dressing to help keep the bandage from sticking.   Change your dressing right away if it becomes wet or dirty.   Only take over-the-counter or prescription medicines for pain, discomfort, or fever as directed by your caregiver.   Follow up with your caregiver within 24-48 hours for a wound check, or as directed. If you were not given a wound-check appointment, look closely at your abrasion for redness, swelling, or pus. These are signs of infection. SEEK IMMEDIATE MEDICAL CARE IF:   You have increasing pain in the wound.   You have redness, swelling, or tenderness around the wound.   You have pus coming from the wound.   You have a fever or persistent symptoms for more than 2-3 days.  You have a fever and your symptoms suddenly get worse.  You have a bad smell coming from the wound or dressing.  MAKE SURE YOU:   Understand these instructions.  Will watch your condition.  Will get help right away if you are not doing well or get worse. Document Released: 12/09/2004 Document Revised: 02/16/2012 Document Reviewed: 02/02/2011 [facility] Patient Information 2015 [place], [place]. This information is not intended to replace advice given to you by your health care provider. Make sure you discuss any questions you have with your health care provider.  Contusion A contusion is  a deep bruise. Contusions are the result of an injury that caused bleeding under the skin. The contusion may turn blue, purple, or yellow. Minor injuries will give you a painless contusion, but more severe contusions may stay painful and swollen for a few weeks.  CAUSES  A contusion is usually caused by a blow, trauma, or direct force to an area of the body. SYMPTOMS   Swelling and redness of the injured area.  Bruising of the injured area.  Tenderness and soreness of the injured area.  Pain. DIAGNOSIS  The diagnosis can be made by taking a history and physical exam. An X-ray, CT scan, or MRI may be  needed to determine if there were any associated injuries, such as fractures. TREATMENT  Specific treatment will depend on what area of the body was injured. In general, the best treatment for a contusion is resting, icing, elevating, and applying cold compresses to the injured area. Over-the-counter medicines may also be recommended for pain control. Ask your caregiver what the best treatment is for your contusion. HOME CARE INSTRUCTIONS   Put ice on the injured area.  Put ice in a plastic bag.  Place a towel between your skin and the bag.  Leave the ice on for 15-20 minutes, 3-4 times a day, or as directed by your health care provider.  Only take over-the-counter or prescription medicines for pain, discomfort, or fever as directed by your caregiver. Your caregiver may recommend avoiding anti-inflammatory medicines (aspirin, ibuprofen, and naproxen) for 48 hours because these medicines may increase bruising.  Rest the injured area.  If possible, elevate the injured area to reduce swelling. SEEK IMMEDIATE MEDICAL CARE IF:   You have increased bruising or swelling.  You have pain that is getting worse.  Your swelling or pain is not relieved with medicines. MAKE SURE YOU:   Understand these instructions.  Will watch your condition.  Will get help right away if you are not doing well or get worse. Document Released: 12/09/2004 Document Revised: 03/06/2013 Document Reviewed: 01/04/2011 [facility] Patient Information 2015 [place], [place]. This information is not intended to replace advice given to you by your health care provider. Make sure you discuss any questions you have with your health care provider.
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
## 2014-01-25 NOTE — ED Notes (Signed)
Pt comes in after fall due to slipping on satin bedspread when trying to get up to bathroom.  Pt c/o  Right hip pain and back pain.

## 2014-01-28 ENCOUNTER — Telehealth: Payer: Self-pay | Admitting: Hematology

## 2014-01-28 NOTE — Telephone Encounter (Signed)
Pt called and confirmed 02/06/14 appt d.t

## 2014-01-28 NOTE — Telephone Encounter (Signed)
Please call [facility] and determine elevatable legs for his wheel chair.

## 2014-01-29 ENCOUNTER — Telehealth: Payer: Self-pay | Admitting: Internal Medicine

## 2014-01-29 NOTE — Telephone Encounter (Signed)
We are working on his wheelchair request. I cannot release his labs because he has not activated his MyChart. You can tell him that his white blood cell count was still elevated, however it was less than previous. His platlets remain high at this time. Otherwise the rest of his labs are as expected.

## 2014-01-29 NOTE — Telephone Encounter (Signed)
Pt called in and he is wanting a call back with all his lab results.  Best number is cell number

## 2014-01-29 NOTE — Telephone Encounter (Signed)
Called pt and left message for him to call back

## 2014-01-29 NOTE — Telephone Encounter (Signed)
Let pt know that his platlet count was high and was most likely related to his condition. He did say that his legs were leaking like crazy so per Marya Amsler let him know he could take an additional fluid pill to help get the fluid off. Pt is aware that wheelchair prescription is being worked on. Would like to know when to make a follow up appointment on his condition.

## 2014-01-29 NOTE — Telephone Encounter (Signed)
He can make a follow up appointment in another couple of weeks or sooner if the leg edema continues or if he develops shortness of breath.

## 2014-01-29 NOTE — Telephone Encounter (Signed)
Patient saw Terri Piedra, will forward to him.

## 2014-01-29 NOTE — Telephone Encounter (Signed)
No treatment changes at this time unless he continues to have excess fluid in his legs or shortness of breath. Then he can take an additional dose of his Lasix as we discussed during his visit.

## 2014-01-30 ENCOUNTER — Other Ambulatory Visit: Payer: Self-pay | Admitting: Family

## 2014-01-30 MED ORDER — ANAGRELIDE HCL 0.5 MG PO CAPS: 0.5000 mg | ORAL_CAPSULE | Freq: Two times a day (BID) | ORAL | Status: AC

## 2014-01-30 NOTE — Telephone Encounter (Signed)
Called pt to let him know he could come in a couple of weeks but sooner if legs are worse. He stated that his legs were worse even with taking an extra fluid pill. I transferred him over to scheduling for him to make an appointment to come in as soon as he could to check on his legs.

## 2014-01-30 NOTE — Telephone Encounter (Signed)
Called pt and informed of results.   Pt is requesting a refill of anagrelide. Stated that the pharmacy told him that the manufacturer is out.  Will call pharm when they open and see what can be done.   [facility] was not able to get from the manufacturer of the brand or generic rx.   Called [facility] did have enough for 30day fill.

## 2014-01-30 NOTE — Addendum Note (Signed)
Addended by: Lowella Dandy on: 01/30/2014 10:51 AM   Modules accepted: Orders

## 2014-01-31 ENCOUNTER — Encounter (HOSPITAL_COMMUNITY): Payer: Medicare Other

## 2014-01-31 ENCOUNTER — Telehealth: Payer: Self-pay | Admitting: Internal Medicine

## 2014-01-31 ENCOUNTER — Ambulatory Visit: Payer: Medicare Other | Admitting: Family

## 2014-01-31 NOTE — Telephone Encounter (Signed)
LVM with pt letting him know that I did not call him.  However, I stated that he could call me back if he needed to and we would do our best to figure out who left him that message.

## 2014-01-31 NOTE — Telephone Encounter (Signed)
Pt called back said he was returning nurse call??

## 2014-02-01 ENCOUNTER — Telehealth: Payer: Self-pay | Admitting: *Deleted

## 2014-02-01 ENCOUNTER — Telehealth: Payer: Self-pay | Admitting: Hematology and Oncology

## 2014-02-01 NOTE — Telephone Encounter (Signed)
cx 12/1 appt for lb/fu w/YF per stacy/NG - NG will see pt 11/24. stacy to call pt.

## 2014-02-01 NOTE — Telephone Encounter (Signed)
Called and informed patient that he will have labs and see Dr. Alvy Bimler on 02/05/14 at 3:30.  Patient verbalized understanding.

## 2014-02-04 ENCOUNTER — Telehealth: Payer: Self-pay | Admitting: Hematology and Oncology

## 2014-02-04 ENCOUNTER — Telehealth: Payer: Self-pay | Admitting: Family

## 2014-02-04 ENCOUNTER — Telehealth: Payer: Self-pay

## 2014-02-04 NOTE — Telephone Encounter (Signed)
Medicare generally will not cover any of the prescription cough medicines. I would recommend OTC Delsym for cough or even a teaspoon of honey as needed. If we would like to pursue any prescription cough meds I will be happy to prescribe them.
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
## 2014-02-04 NOTE — Telephone Encounter (Signed)
Erline Levine called from [facility] stated that Fernando Lane send Mr. Buss cough medication but insurance is not going to cover that. They were wondering if we can send in something else that insurance might cover or what does Fernando Lane wants to go. Please advise. Please call Erline Levine back

## 2014-02-04 NOTE — Telephone Encounter (Signed)
Pt confirm appt d/t for 02/14/14. (Ok per RN to sch this, Medication has been taken care of)Mailed new cal.

## 2014-02-05 ENCOUNTER — Ambulatory Visit: Payer: Medicare Other | Admitting: Hematology and Oncology

## 2014-02-05 ENCOUNTER — Other Ambulatory Visit: Payer: Medicare Other

## 2014-02-05 NOTE — Telephone Encounter (Signed)
Pt stated he would try delsym.

## 2014-02-06 ENCOUNTER — Ambulatory Visit: Payer: Medicare Other

## 2014-02-06 ENCOUNTER — Other Ambulatory Visit: Payer: Medicare Other

## 2014-02-08 ENCOUNTER — Emergency Department (HOSPITAL_COMMUNITY): Payer: Medicare Other

## 2014-02-08 ENCOUNTER — Inpatient Hospital Stay (HOSPITAL_COMMUNITY)
Admission: EM | Admit: 2014-02-08 | Discharge: 2014-02-15 | DRG: 603 | Disposition: A | Discharge status: Skilled Nursing Facility | Payer: Medicare Other | Attending: Internal Medicine | Admitting: Internal Medicine

## 2014-02-08 ENCOUNTER — Encounter (HOSPITAL_COMMUNITY): Payer: Self-pay

## 2014-02-08 ENCOUNTER — Ambulatory Visit (INDEPENDENT_AMBULATORY_CARE_PROVIDER_SITE_OTHER): Payer: Medicare Other | Admitting: Physician Assistant

## 2014-02-08 ENCOUNTER — Encounter: Payer: Self-pay | Admitting: Physician Assistant

## 2014-02-08 VITALS — BP 100/60 | HR 85

## 2014-02-08 DIAGNOSIS — Z8249 Family history of ischemic heart disease and other diseases of the circulatory system: Secondary | ICD-10-CM | POA: Diagnosis not present

## 2014-02-08 DIAGNOSIS — Z89421 Acquired absence of other right toe(s): Secondary | ICD-10-CM | POA: Diagnosis not present

## 2014-02-08 DIAGNOSIS — C946 Myelodysplastic disease, not classified: Secondary | ICD-10-CM | POA: Diagnosis present

## 2014-02-08 DIAGNOSIS — I959 Hypotension, unspecified: Secondary | ICD-10-CM | POA: Diagnosis present

## 2014-02-08 DIAGNOSIS — I872 Venous insufficiency (chronic) (peripheral): Secondary | ICD-10-CM | POA: Diagnosis present

## 2014-02-08 DIAGNOSIS — Z6837 Body mass index (BMI) 37.0-37.9, adult: Secondary | ICD-10-CM | POA: Diagnosis not present

## 2014-02-08 DIAGNOSIS — Z79899 Other long term (current) drug therapy: Secondary | ICD-10-CM

## 2014-02-08 DIAGNOSIS — Z7902 Long term (current) use of antithrombotics/antiplatelets: Secondary | ICD-10-CM

## 2014-02-08 DIAGNOSIS — Z87891 Personal history of nicotine dependence: Secondary | ICD-10-CM | POA: Diagnosis not present

## 2014-02-08 DIAGNOSIS — L03314 Cellulitis of groin: Principal | ICD-10-CM | POA: Diagnosis present

## 2014-02-08 DIAGNOSIS — R52 Pain, unspecified: Secondary | ICD-10-CM | POA: Diagnosis not present

## 2014-02-08 DIAGNOSIS — D45 Polycythemia vera: Secondary | ICD-10-CM | POA: Diagnosis present

## 2014-02-08 DIAGNOSIS — G4733 Obstructive sleep apnea (adult) (pediatric): Secondary | ICD-10-CM | POA: Diagnosis present

## 2014-02-08 DIAGNOSIS — D751 Secondary polycythemia: Secondary | ICD-10-CM | POA: Diagnosis present

## 2014-02-08 DIAGNOSIS — L089 Local infection of the skin and subcutaneous tissue, unspecified: Secondary | ICD-10-CM | POA: Diagnosis present

## 2014-02-08 DIAGNOSIS — N492 Inflammatory disorders of scrotum: Secondary | ICD-10-CM | POA: Diagnosis present

## 2014-02-08 DIAGNOSIS — I2510 Atherosclerotic heart disease of native coronary artery without angina pectoris: Secondary | ICD-10-CM | POA: Diagnosis present

## 2014-02-08 DIAGNOSIS — L03031 Cellulitis of right toe: Secondary | ICD-10-CM | POA: Diagnosis present

## 2014-02-08 DIAGNOSIS — L03115 Cellulitis of right lower limb: Secondary | ICD-10-CM | POA: Diagnosis present

## 2014-02-08 DIAGNOSIS — I739 Peripheral vascular disease, unspecified: Secondary | ICD-10-CM | POA: Diagnosis present

## 2014-02-08 DIAGNOSIS — L03119 Cellulitis of unspecified part of limb: Secondary | ICD-10-CM

## 2014-02-08 DIAGNOSIS — N179 Acute kidney failure, unspecified: Secondary | ICD-10-CM | POA: Diagnosis not present

## 2014-02-08 DIAGNOSIS — I5022 Chronic systolic (congestive) heart failure: Secondary | ICD-10-CM | POA: Diagnosis present

## 2014-02-08 DIAGNOSIS — L03116 Cellulitis of left lower limb: Secondary | ICD-10-CM | POA: Diagnosis present

## 2014-02-08 DIAGNOSIS — M545 Low back pain: Secondary | ICD-10-CM | POA: Diagnosis present

## 2014-02-08 DIAGNOSIS — Z96652 Presence of left artificial knee joint: Secondary | ICD-10-CM | POA: Diagnosis present

## 2014-02-08 DIAGNOSIS — I10 Essential (primary) hypertension: Secondary | ICD-10-CM | POA: Diagnosis present

## 2014-02-08 DIAGNOSIS — Z22322 Carrier or suspected carrier of Methicillin resistant Staphylococcus aureus: Secondary | ICD-10-CM | POA: Diagnosis not present

## 2014-02-08 DIAGNOSIS — I255 Ischemic cardiomyopathy: Secondary | ICD-10-CM | POA: Diagnosis present

## 2014-02-08 DIAGNOSIS — Z7982 Long term (current) use of aspirin: Secondary | ICD-10-CM | POA: Diagnosis not present

## 2014-02-08 DIAGNOSIS — G8929 Other chronic pain: Secondary | ICD-10-CM | POA: Diagnosis present

## 2014-02-08 DIAGNOSIS — L0390 Cellulitis, unspecified: Secondary | ICD-10-CM | POA: Insufficient documentation

## 2014-02-08 DIAGNOSIS — E785 Hyperlipidemia, unspecified: Secondary | ICD-10-CM | POA: Diagnosis present

## 2014-02-08 DIAGNOSIS — A047 Enterocolitis due to Clostridium difficile: Secondary | ICD-10-CM | POA: Diagnosis present

## 2014-02-08 DIAGNOSIS — Z66 Do not resuscitate: Secondary | ICD-10-CM | POA: Diagnosis present

## 2014-02-08 DIAGNOSIS — R58 Hemorrhage, not elsewhere classified: Secondary | ICD-10-CM

## 2014-02-08 DIAGNOSIS — R601 Generalized edema: Secondary | ICD-10-CM | POA: Diagnosis present

## 2014-02-08 DIAGNOSIS — M542 Cervicalgia: Secondary | ICD-10-CM | POA: Diagnosis present

## 2014-02-08 LAB — CBC WITH DIFFERENTIAL/PLATELET
Basophils Absolute: 0 10*3/uL (ref 0.0–0.1)
Basophils Relative: 0 % (ref 0–1)
Eosinophils Absolute: 1 10*3/uL — ABNORMAL HIGH (ref 0.0–0.7)
Eosinophils Relative: 5 % (ref 0–5)
HCT: 42.8 % (ref 39.0–52.0)
Hemoglobin: 13.1 g/dL (ref 13.0–17.0)
Lymphocytes Relative: 9 % — ABNORMAL LOW (ref 12–46)
Lymphs Abs: 1.6 10*3/uL (ref 0.7–4.0)
MCH: 26.8 pg (ref 26.0–34.0)
MCHC: 30.6 g/dL (ref 30.0–36.0)
MCV: 87.5 fL (ref 78.0–100.0)
Monocytes Absolute: 0.4 10*3/uL (ref 0.1–1.0)
Monocytes Relative: 2 % — ABNORMAL LOW (ref 3–12)
Neutro Abs: 16.2 10*3/uL — ABNORMAL HIGH (ref 1.7–7.7)
Neutrophils Relative %: 84 % — ABNORMAL HIGH (ref 43–77)
Platelets: 594 10*3/uL — ABNORMAL HIGH (ref 150–400)
RBC: 4.89 MIL/uL (ref 4.22–5.81)
RDW: 16.9 % — ABNORMAL HIGH (ref 11.5–15.5)
WBC: 19.3 10*3/uL — ABNORMAL HIGH (ref 4.0–10.5)

## 2014-02-08 LAB — URINALYSIS, ROUTINE W REFLEX MICROSCOPIC
Bilirubin Urine: NEGATIVE
Glucose, UA: NEGATIVE mg/dL
Ketones, ur: NEGATIVE mg/dL
Nitrite: NEGATIVE
Protein, ur: NEGATIVE mg/dL
Specific Gravity, Urine: 1.016 (ref 1.005–1.030)
Urobilinogen, UA: 1 mg/dL (ref 0.0–1.0)
pH: 5.5 (ref 5.0–8.0)

## 2014-02-08 LAB — BASIC METABOLIC PANEL
Anion gap: 15 (ref 5–15)
BUN: 36 mg/dL — ABNORMAL HIGH (ref 6–23)
CO2: 21 mEq/L (ref 19–32)
Calcium: 9.3 mg/dL (ref 8.4–10.5)
Chloride: 103 mEq/L (ref 96–112)
Creatinine, Ser: 1.04 mg/dL (ref 0.50–1.35)
GFR calc Af Amer: 86 mL/min — ABNORMAL LOW (ref >90)
GFR calc non Af Amer: 74 mL/min — ABNORMAL LOW (ref >90)
Glucose, Bld: 87 mg/dL (ref 70–99)
Potassium: 5 mEq/L (ref 3.7–5.3)
Sodium: 139 mEq/L (ref 137–147)

## 2014-02-08 LAB — URINE MICROSCOPIC-ADD ON

## 2014-02-08 MED ORDER — ACETAMINOPHEN 325 MG PO TABS: 650.0000 mg | ORAL_TABLET | Freq: Four times a day (QID) | ORAL | Status: DC | PRN

## 2014-02-08 MED ORDER — ONDANSETRON HCL 4 MG/2ML IJ SOLN
4.0000 mg | Freq: Once | INTRAMUSCULAR | Status: AC
  Administered 2014-02-08: 4 mg via INTRAVENOUS
  Filled 2014-02-08: qty 2

## 2014-02-08 MED ORDER — SENNOSIDES-DOCUSATE SODIUM 8.6-50 MG PO TABS
1.0000 | ORAL_TABLET | Freq: Every day | ORAL | Status: DC
  Administered 2014-02-08 – 2014-02-14 (×6): 1 via ORAL
  Filled 2014-02-08 (×9): qty 1

## 2014-02-08 MED ORDER — SODIUM CHLORIDE 0.9 % IV SOLN
INTRAVENOUS | Status: DC
  Administered 2014-02-08: 16:00:00 via INTRAVENOUS

## 2014-02-08 MED ORDER — ANAGRELIDE HCL 0.5 MG PO CAPS
0.5000 mg | ORAL_CAPSULE | Freq: Two times a day (BID) | ORAL | Status: DC
  Administered 2014-02-08 – 2014-02-15 (×14): 0.5 mg via ORAL
  Filled 2014-02-08 (×15): qty 1

## 2014-02-08 MED ORDER — FUROSEMIDE 80 MG PO TABS
80.0000 mg | ORAL_TABLET | Freq: Two times a day (BID) | ORAL | Status: DC
Start: 2014-02-09 — End: 2014-02-10
  Administered 2014-02-09 – 2014-02-10 (×3): 80 mg via ORAL
  Filled 2014-02-08 (×5): qty 1

## 2014-02-08 MED ORDER — SACCHAROMYCES BOULARDII 250 MG PO CAPS
250.0000 mg | ORAL_CAPSULE | Freq: Two times a day (BID) | ORAL | Status: DC
  Administered 2014-02-08 – 2014-02-15 (×14): 250 mg via ORAL
  Filled 2014-02-08 (×15): qty 1

## 2014-02-08 MED ORDER — SPIRONOLACTONE 25 MG PO TABS
25.0000 mg | ORAL_TABLET | Freq: Every day | ORAL | Status: DC
  Administered 2014-02-09 – 2014-02-12 (×4): 25 mg via ORAL
  Filled 2014-02-08 (×5): qty 1

## 2014-02-08 MED ORDER — ENSURE COMPLETE PO LIQD
237.0000 mL | Freq: Two times a day (BID) | ORAL | Status: DC
  Administered 2014-02-09 – 2014-02-15 (×10): 237 mL via ORAL

## 2014-02-08 MED ORDER — CLOPIDOGREL BISULFATE 75 MG PO TABS
75.0000 mg | ORAL_TABLET | Freq: Every day | ORAL | Status: DC
  Administered 2014-02-10 – 2014-02-14 (×6): 75 mg via ORAL
  Filled 2014-02-08 (×7): qty 1

## 2014-02-08 MED ORDER — VANCOMYCIN HCL IN DEXTROSE 1-5 GM/200ML-% IV SOLN
1000.0000 mg | Freq: Two times a day (BID) | INTRAVENOUS | Status: DC
  Administered 2014-02-09 – 2014-02-11 (×5): 1000 mg via INTRAVENOUS
  Filled 2014-02-08 (×5): qty 200

## 2014-02-08 MED ORDER — COLCHICINE 0.6 MG PO TABS
0.6000 mg | ORAL_TABLET | Freq: Every day | ORAL | Status: DC
  Administered 2014-02-09 – 2014-02-14 (×6): 0.6 mg via ORAL
  Filled 2014-02-08 (×7): qty 1

## 2014-02-08 MED ORDER — BENZONATATE 100 MG PO CAPS: 100.0000 mg | ORAL_CAPSULE | Freq: Three times a day (TID) | ORAL | Status: DC | PRN

## 2014-02-08 MED ORDER — PREGABALIN 50 MG PO CAPS
50.0000 mg | ORAL_CAPSULE | Freq: Three times a day (TID) | ORAL | Status: DC
  Administered 2014-02-08 – 2014-02-15 (×20): 50 mg via ORAL
  Filled 2014-02-08 (×19): qty 1

## 2014-02-08 MED ORDER — BACID PO TABS
2.0000 | ORAL_TABLET | Freq: Two times a day (BID) | ORAL | Status: DC
  Administered 2014-02-08 – 2014-02-15 (×13): 2 via ORAL
  Filled 2014-02-08 (×21): qty 2

## 2014-02-08 MED ORDER — PIPERACILLIN-TAZOBACTAM 3.375 G IVPB
3.3750 g | Freq: Three times a day (TID) | INTRAVENOUS | Status: DC
  Administered 2014-02-08 – 2014-02-15 (×20): 3.375 g via INTRAVENOUS
  Filled 2014-02-08 (×22): qty 50

## 2014-02-08 MED ORDER — HYDROCODONE-ACETAMINOPHEN 7.5-325 MG PO TABS
1.0000 | ORAL_TABLET | ORAL | Status: DC | PRN
  Administered 2014-02-10 – 2014-02-12 (×3): 1 via ORAL
  Filled 2014-02-08 (×3): qty 1

## 2014-02-08 MED ORDER — SODIUM CHLORIDE 0.9 % IJ SOLN
3.0000 mL | Freq: Two times a day (BID) | INTRAMUSCULAR | Status: DC
  Administered 2014-02-08 – 2014-02-10 (×3): 3 mL via INTRAVENOUS

## 2014-02-08 MED ORDER — CHLORHEXIDINE GLUCONATE 0.12 % MT SOLN
15.0000 mL | Freq: Every day | OROMUCOSAL | Status: DC
  Administered 2014-02-09 – 2014-02-15 (×7): 15 mL via OROMUCOSAL
  Filled 2014-02-08 (×7): qty 15

## 2014-02-08 MED ORDER — HYDROMORPHONE HCL 1 MG/ML IJ SOLN
1.0000 mg | Freq: Once | INTRAMUSCULAR | Status: AC
  Administered 2014-02-08: 1 mg via INTRAVENOUS
  Filled 2014-02-08: qty 1

## 2014-02-08 MED ORDER — MORPHINE SULFATE 2 MG/ML IJ SOLN
1.0000 mg | INTRAMUSCULAR | Status: DC | PRN
  Administered 2014-02-08 – 2014-02-15 (×22): 1 mg via INTRAVENOUS
  Filled 2014-02-08 (×21): qty 1

## 2014-02-08 MED ORDER — PIPERACILLIN-TAZOBACTAM 3.375 G IVPB 30 MIN
3.3750 g | Freq: Once | INTRAVENOUS | Status: AC
  Administered 2014-02-08: 3.375 g via INTRAVENOUS
  Filled 2014-02-08: qty 50

## 2014-02-08 MED ORDER — ONDANSETRON HCL 4 MG PO TABS: 4.0000 mg | ORAL_TABLET | Freq: Four times a day (QID) | ORAL | Status: DC | PRN

## 2014-02-08 MED ORDER — CARVEDILOL 3.125 MG PO TABS
3.1250 mg | ORAL_TABLET | Freq: Two times a day (BID) | ORAL | Status: DC
  Administered 2014-02-09 – 2014-02-15 (×13): 3.125 mg via ORAL
  Filled 2014-02-08 (×15): qty 1

## 2014-02-08 MED ORDER — ASPIRIN 325 MG PO TABS
325.0000 mg | ORAL_TABLET | Freq: Every day | ORAL | Status: DC
  Administered 2014-02-09 – 2014-02-15 (×7): 325 mg via ORAL
  Filled 2014-02-08 (×7): qty 1

## 2014-02-08 MED ORDER — ACETAMINOPHEN 650 MG RE SUPP: 650.0000 mg | Freq: Four times a day (QID) | RECTAL | Status: DC | PRN

## 2014-02-08 MED ORDER — SODIUM CHLORIDE 0.9 % IV SOLN
2000.0000 mg | Freq: Once | INTRAVENOUS | Status: AC
  Administered 2014-02-08: 2000 mg via INTRAVENOUS
  Filled 2014-02-08: qty 2000

## 2014-02-08 MED ORDER — VITAMIN B-1 100 MG PO TABS
100.0000 mg | ORAL_TABLET | Freq: Every day | ORAL | Status: DC
  Administered 2014-02-09 – 2014-02-15 (×7): 100 mg via ORAL
  Filled 2014-02-08 (×7): qty 1

## 2014-02-08 MED ORDER — ATORVASTATIN CALCIUM 40 MG PO TABS
40.0000 mg | ORAL_TABLET | Freq: Every day | ORAL | Status: DC
  Administered 2014-02-09 – 2014-02-15 (×7): 40 mg via ORAL
  Filled 2014-02-08 (×7): qty 1

## 2014-02-08 MED ORDER — DOCUSATE SODIUM 100 MG PO CAPS
100.0000 mg | ORAL_CAPSULE | Freq: Two times a day (BID) | ORAL | Status: DC
  Administered 2014-02-08 – 2014-02-15 (×12): 100 mg via ORAL
  Filled 2014-02-08 (×15): qty 1

## 2014-02-08 MED ORDER — NITROGLYCERIN 0.4 MG SL SUBL: 0.4000 mg | SUBLINGUAL_TABLET | SUBLINGUAL | Status: DC | PRN

## 2014-02-08 MED ORDER — ONDANSETRON HCL 4 MG/2ML IJ SOLN: 4.0000 mg | Freq: Four times a day (QID) | INTRAMUSCULAR | Status: DC | PRN

## 2014-02-08 MED ORDER — LISINOPRIL 5 MG PO TABS
5.0000 mg | ORAL_TABLET | Freq: Every day | ORAL | Status: DC
  Administered 2014-02-09 – 2014-02-11 (×3): 5 mg via ORAL
  Filled 2014-02-08 (×3): qty 1

## 2014-02-08 MED ORDER — ADULT MULTIVITAMIN W/MINERALS CH
1.0000 | ORAL_TABLET | Freq: Every day | ORAL | Status: DC
  Administered 2014-02-09 – 2014-02-15 (×7): 1 via ORAL
  Filled 2014-02-08 (×7): qty 1

## 2014-02-08 MED ORDER — FOLIC ACID 1 MG PO TABS
1.0000 mg | ORAL_TABLET | Freq: Every day | ORAL | Status: DC | PRN
  Filled 2014-02-08: qty 1

## 2014-02-08 MED ORDER — NYSTATIN 100000 UNIT/GM EX POWD
Freq: Once | CUTANEOUS | Status: AC
  Administered 2014-02-08: 19:00:00 via TOPICAL
  Filled 2014-02-08: qty 15

## 2014-02-08 MED ORDER — RAMELTEON 8 MG PO TABS
8.0000 mg | ORAL_TABLET | Freq: Every day | ORAL | Status: DC
  Administered 2014-02-08 – 2014-02-14 (×7): 8 mg via ORAL
  Filled 2014-02-08 (×8): qty 1

## 2014-02-08 NOTE — ED Notes (Signed)
Patient transported to X-ray 

## 2014-02-08 NOTE — Progress Notes (Signed)
ANTIBIOTIC CONSULT NOTE - INITIAL  Pharmacy Consult for vancomycin/zosyn Indication: cellulitis  No Known Allergies  Patient Measurements:   Adjusted Body Weight:   Vital Signs: Temp: 97.9 F (36.6 C) (11/27 1324) Temp Source: Oral (11/27 1324) BP: 101/59 mmHg (11/27 1839) Pulse Rate: 80 (11/27 2000) Intake/Output from previous day:   Intake/Output from this shift:    Labs:  Recent Labs  02/08/14 1610  WBC 19.3*  HGB 13.1  PLT 594*  CREATININE 1.04   CrCl cannot be calculated (Unknown ideal weight.). No results for input(s): VANCOTROUGH, VANCOPEAK, VANCORANDOM, GENTTROUGH, GENTPEAK, GENTRANDOM, TOBRATROUGH, TOBRAPEAK, TOBRARND, AMIKACINPEAK, AMIKACINTROU, AMIKACIN in the last 72 hours.   Microbiology: No results found for this or any previous visit (from the past 720 hour(s)).  Medical History: Past Medical History  Diagnosis Date  . Chronic low back pain   . 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 Chronic neck pain   . OSA (obstructive sleep apnea)     CPAP @ bedtime  . Allergy   . Unspecified essential hypertension     Resistant, 2D Echo - EF-55-60  . Polycythemia   . CAD (coronary artery disease)     Vessel type unspecified  . PVD (peripheral vascular disease)   . DDD (degenerative disc disease), cervical    Assessment: 2 YOM presents with bilateral leg pain and groin pain.  Recent R big toe laceration but found to have cellulitis of leg as well as groin/penis.  Given vancomycin 2gm IV x 1 and zosyn 3.375gm x 1 in ED.  11/27 >>vancomycin  >> 11/27 >> zosyn  >>    Tmax: afeb WBCs: elevated Renal: SCr WNL for estimated normalized CrCl = 53ml/min  NO cultures currently ordered  Goal of Therapy:  Vancomycin trough level 10-15 mcg/ml  Plan:   Vancomycin 1gm IV q12h  Check vancomycin trough at steady state  Follow renal function  Zosyn 3.375gm IV q8h over 4h infusion  Doreene Eland, PharmD,  BCPS.   Pager: [phone]  02/08/2014,9:11 PM

## 2014-02-08 NOTE — Progress Notes (Addendum)
Patient presents to clinic today c/o persistent bleeding from laceration to right great tow occurring 3 days ago.  Patient with extensive cardiovascular history.  Is currently on ASA 325 mg and Plavix 75 mg daily.  Has not taken in 2 days.  Denies SOB, palpitations, lightheadedness, dizziness, or fatigue.  Last Tetanus in 2009.  Patient endorses bilateral lower extremity edema with weeping.  Denies pain or tenderness. Is taking Furosemide as directed.  Patient also c/o inguinal and penile pain with swelling, erythema and weeping.  Endorses significant tenderness to touch.  Denies recent trauma or injury.  Endorses good urinary output. Endorses being recently catheterized.  Past Medical History  Diagnosis Date  . Chronic low back pain   . 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 Chronic neck pain   . OSA (obstructive sleep apnea)     CPAP @ bedtime  . Allergy   . Unspecified essential hypertension     Resistant, 2D Echo - EF-55-60  . Polycythemia   . CAD (coronary artery disease)     Vessel type unspecified  . PVD (peripheral vascular disease)   . DDD (degenerative disc disease), cervical     No current facility-administered medications on file prior to visit.   Current Outpatient Prescriptions on File Prior to Visit  Medication Sig Dispense Refill  . acetaminophen (TYLENOL) 500 MG tablet Take 500 mg by mouth 2 (two) times daily.    Marland Kitchen anagrelide (AGRYLIN) 0.5 MG capsule Take 1 capsule (0.5 mg total) by mouth 2 (two) times daily. 60 capsule 1  . aspirin 325 MG tablet Take 1 tablet (325 mg total) by mouth daily. 30 tablet 0  . atorvastatin (LIPITOR) 40 MG tablet Take 40 mg by mouth daily.    Marland Kitchen atorvastatin (LIPITOR) 80 MG tablet Take 1 tablet (80 mg total) by mouth daily at 6 PM.    . benzonatate (TESSALON) 100 MG capsule Take 100 mg by mouth 3 (three) times daily as needed for cough.    . carvedilol (COREG) 3.125 MG tablet Take 1 tablet  (3.125 mg total) by mouth 2 (two) times daily with a meal. 60 tablet 0  . ceFEPIme 1 g in dextrose 5 % 50 mL Inject 1 g into the vein every 8 (eight) hours.    . chlorhexidine (PERIDEX) 0.12 % solution Use as directed 15 mLs in the mouth or throat daily.    . chlorpheniramine-HYDROcodone (TUSSIONEX) 10-8 MG/5ML LQCR Take 5 mLs by mouth every 12 (twelve) hours as needed for cough.  0  . clopidogrel (PLAVIX) 75 MG tablet Take 75 mg by mouth daily at 6 PM.    . colchicine 0.6 MG tablet Take 0.6 mg by mouth daily at 6 PM.    . docusate sodium (COLACE) 100 MG capsule Take 100 mg by mouth 2 (two) times daily.    . feeding supplement, ENSURE COMPLETE, (ENSURE COMPLETE) LIQD Take 237 mLs by mouth 2 (two) times daily between meals.    . folic acid (FOLVITE) 1 MG tablet Take 1 mg by mouth daily as needed (indigestion).     . furosemide (LASIX) 80 MG tablet Take 1 tablet (80 mg total) by mouth 2 (two) times daily. 60 tablet 2  . HYDROcodone-acetaminophen (NORCO) 7.5-325 MG per tablet Take 1 tablet by mouth every 4 (four) hours as needed for moderate pain.    Marland Kitchen lactobacillus acidophilus (BACID) TABS tablet Take 2 tablets by mouth 2 (two) times daily.    Marland Kitchen  lisinopril (PRINIVIL,ZESTRIL) 5 MG tablet Take 1 tablet (5 mg total) by mouth daily.    . Multiple Vitamins-Minerals (MULTIVITAMIN WITH MINERALS) tablet Take 1 tablet by mouth daily.    . nitroGLYCERIN (NITROSTAT) 0.4 MG SL tablet Place 0.4 mg under the tongue every 5 (five) minutes as needed for chest pain.    . pregabalin (LYRICA) 50 MG capsule Take 50 mg by mouth 3 (three) times daily.    Marland Kitchen saccharomyces boulardii (FLORASTOR) 250 MG capsule Take 1 capsule (250 mg total) by mouth 2 (two) times daily. 60 capsule 0  . SANTYL ointment Apply 1 application topically daily as needed (on buttocks).     . senna-docusate (SENOKOT-S) 8.6-50 MG per tablet Take 1 tablet by mouth at bedtime. 30 tablet 0  . spironolactone (ALDACTONE) 25 MG tablet Take 1 tablet (25 mg  total) by mouth daily. 30 tablet 0  . thiamine 100 MG tablet Take 1 tablet (100 mg total) by mouth daily.      No Known Allergies  Family History  Problem Relation Age of Onset  . Heart attack Father     History   Social History  . Marital Status: Divorced    Spouse Name: N/A    Number of Children: 1  . Years of Education: N/A   Occupational History  .     Social History Main Topics  . Smoking status: Former Smoker -- 0 years    Types: Cigarettes, Pipe    Quit date: 03/15/1968  . Smokeless tobacco: Former Systems developer     Comment: Pt quit Cigarettes 1970's- and cigars & chew 1990  . Alcohol Use: 3.6 oz/week    6 Cans of beer per week  . Drug Use: No  . Sexual Activity: None   Other Topics Concern  . None   Social History Narrative   Divorced, lives alone. Daughter lives in town. Disable/Former management/construction   Review of Systems - See HPI.  All other ROS are negative.  BP 100/60 mmHg  Pulse 85  SpO2 95%  Physical Exam  Constitutional: He is oriented to person, place, and time and well-developed, well-nourished, and in no distress.  HENT:  Head: Normocephalic and atraumatic.  Eyes: Conjunctivae are normal.  Cardiovascular: Normal rate, regular rhythm, normal heart sounds and intact distal pulses.   Bilateral lower extremity edema with copious weeping.  Lower extremities with skin changes consistent with chronic venous stasis, but also with active cellulitis.  Pulmonary/Chest: Effort normal and breath sounds normal. No respiratory distress. He has no wheezes.  Genitourinary:  Left inguinal area and penis with erythema, warmth, tenderness and weeping, extending down L leg, consistent with severe cellulitis.  Neurological: He is alert and oriented to person, place, and time.  Skin: Skin is warm and dry.  3 cm laceration of right great toe with significant and persistent bleeding. Decreased ROM and numbness noted.    Psychiatric: Affect normal.       Assessment/Plan: Bleeding From laceration sustained 3 days ago. Patient on blood thinner and with significant PVD and CAD.  Triaged to ER for acute workup and cauterization.    Cellulitis Extensive -- affecting bilateral lower extremity and inguinal region.  Patient in need of IV antibiotics giving severity.  Patient also with laceration and significant bleeding.  Patient triaged to [facility] ER for acute management and treatment.

## 2014-02-08 NOTE — Assessment & Plan Note (Signed)
From laceration sustained 3 days ago. Patient on blood thinner and with significant PVD and CAD.  Triaged to ER for acute workup and cauterization.

## 2014-02-08 NOTE — ED Notes (Signed)
Per pt, seen at MD office today.  Has laceration to right foot which occurred yesterday.  Pt is on blood thinners.  Still bleeding. Also has weeping in legs which has been seen for in past.

## 2014-02-08 NOTE — Assessment & Plan Note (Signed)
Extensive -- affecting bilateral lower extremity and inguinal region.  Patient in need of IV antibiotics giving severity.  Patient also with laceration and significant bleeding.  Patient triaged to [facility] ER for acute management and treatment.

## 2014-02-08 NOTE — Consult Note (Signed)
Urology Consult   Physician requesting consult: Dr. Hal Hope  Reason for consult: Scrotal and groin pain/swelling  History of Present Illness: Fernando Lane is a 63 y.o. with multiple medical comorbidities including severe peripheral vascular disease who was seen by Dr. Gaynelle Arabian during his hospital admission in September for penile and scrotal swelling. He presented to the ED today with complaints of a right toe laceration. He was also noted to have a leukocytosis but no fever.  He has chronic cellulitis of his lower legs by his report.  For the last two days, he apparently has noted scrotal and groin tenderness bilaterally with some swelling of the scrotum.  He states that he has subjectively been voiding without difficulty.  He denies any fever.  He does not localize his symptoms to one side.   Past Medical History  Diagnosis Date  . Chronic low back pain   . 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 Chronic neck pain   . OSA (obstructive sleep apnea)     CPAP @ bedtime  . Allergy   . Unspecified essential hypertension     Resistant, 2D Echo - EF-55-60  . Polycythemia   . CAD (coronary artery disease)     Vessel type unspecified  . PVD (peripheral vascular disease)   . DDD (degenerative disc disease), cervical     Past Surgical History  Procedure Laterality Date  . Lower extrmity doppler      [place] 2008; no evidence for PAD  . US echocardiography  11/2008    Normal valves, mild LAE  . Left knee replacement  10/2006  . Cervical spine surgery  02/2008  . Joint replacement    . Spine surgery    . Amputation Right 12/03/2013    Procedure: AMPUTATION RIGHT FOURTH TOE;  Surgeon: Conrad Strawn, MD;  Location: [facility];  Service: Vascular;  Laterality: Right;    Medications:  Home meds:    Medication List    ASK your doctor about these medications        acetaminophen 500 MG tablet  Commonly known as:  TYLENOL  Take 500 mg by mouth  2 (two) times daily.     anagrelide 0.5 MG capsule  Commonly known as:  AGRYLIN  Take 1 capsule (0.5 mg total) by mouth 2 (two) times daily.     aspirin 325 MG tablet  Take 1 tablet (325 mg total) by mouth daily.     atorvastatin 40 MG tablet  Commonly known as:  LIPITOR  Take 40 mg by mouth daily.     atorvastatin 80 MG tablet  Commonly known as:  LIPITOR  Take 1 tablet (80 mg total) by mouth daily at 6 PM.     benzonatate 100 MG capsule  Commonly known as:  TESSALON  Take 100 mg by mouth 3 (three) times daily as needed for cough.     carvedilol 3.125 MG tablet  Commonly known as:  COREG  Take 1 tablet (3.125 mg total) by mouth 2 (two) times daily with a meal.     ceFEPIme 1 g in dextrose 5 % 50 mL  Inject 1 g into the vein every 8 (eight) hours.     chlorhexidine 0.12 % solution  Commonly known as:  PERIDEX  Use as directed 15 mLs in the mouth or throat daily.     clopidogrel 75 MG tablet  Commonly known as:  PLAVIX  Take 75 mg by mouth daily  at 6 PM.     colchicine 0.6 MG tablet  Take 0.6 mg by mouth daily at 6 PM.     docusate sodium 100 MG capsule  Commonly known as:  COLACE  Take 100 mg by mouth 2 (two) times daily.     feeding supplement (ENSURE COMPLETE) Liqd  Take 237 mLs by mouth 2 (two) times daily between meals.     folic acid 1 MG tablet  Commonly known as:  FOLVITE  Take 1 mg by mouth daily as needed (indigestion).     furosemide 80 MG tablet  Commonly known as:  LASIX  Take 1 tablet (80 mg total) by mouth 2 (two) times daily.     HYDROcodone-acetaminophen 7.5-325 MG per tablet  Commonly known as:  NORCO  Take 1 tablet by mouth every 4 (four) hours as needed for moderate pain.     lactobacillus acidophilus Tabs tablet  Take 2 tablets by mouth 2 (two) times daily.     lisinopril 5 MG tablet  Commonly known as:  PRINIVIL,ZESTRIL  Take 1 tablet (5 mg total) by mouth daily.     multivitamin with minerals tablet  Take 1 tablet by mouth daily.      nitroGLYCERIN 0.4 MG SL tablet  Commonly known as:  NITROSTAT  Place 0.4 mg under the tongue every 5 (five) minutes as needed for chest pain.     pregabalin 50 MG capsule  Commonly known as:  LYRICA  Take 50 mg by mouth 3 (three) times daily.     ROZEREM 8 MG tablet  Generic drug:  ramelteon  Take 8 mg by mouth at bedtime.     saccharomyces boulardii 250 MG capsule  Commonly known as:  FLORASTOR  Take 1 capsule (250 mg total) by mouth 2 (two) times daily.     SANTYL ointment  Generic drug:  collagenase  Apply 1 application topically daily as needed (on buttocks).     senna-docusate 8.6-50 MG per tablet  Commonly known as:  Senokot-S  Take 1 tablet by mouth at bedtime.     spironolactone 25 MG tablet  Commonly known as:  ALDACTONE  Take 1 tablet (25 mg total) by mouth daily.     thiamine 100 MG tablet  Take 1 tablet (100 mg total) by mouth daily.        Scheduled Meds:  Continuous Infusions: . sodium chloride Stopped (02/08/14 2110)  . piperacillin-tazobactam (ZOSYN)  IV    . [START ON 02/09/2014] vancomycin     PRN Meds:.  Allergies: No Known Allergies  Family History  Problem Relation Age of Onset  . Heart attack Father     Social History:  reports that he quit smoking about 45 years ago. His smoking use included Cigarettes and Pipe. He smoked 0.00 packs per day for 0 years. He has quit using smokeless tobacco. He reports that he drinks about 3.6 oz of alcohol per week. He reports that he does not use illicit drugs.  ROS: A complete review of systems was performed.  All systems are negative except for pertinent findings as noted.  Physical Exam:  Vital signs in last 24 hours: Temp:  [97.9 F (36.6 C)-98.8 F (37.1 C)] 98.8 F (37.1 C) (11/27 2111) Pulse Rate:  [68-85] 80 (11/27 2111) Resp:  [16-20] 18 (11/27 2111) BP: (96-109)/(59-85) 96/64 mmHg (11/27 2111) SpO2:  [94 %-100 %] 97 % (11/27 2111) Constitutional:  Alert and oriented, No acute  distress Cardiovascular: Regular rate and rhythm,  No JVD Respiratory: Normal respiratory effort GI: Abdomen is soft, nontender, nondistended, no abdominal masses Genitourinary: No CVAT. He has a hooded foreskin but his meatus although somewhat enlarged (such as can be seen with distal urethral erosion due to prolonged catheterization). He has mild to moderate edema of the distal penile shaft and prepuce. He has symmetrical erythema of the scrotum with mild edema and mild tenderness.  No crepitus is present. No fluctuance or induration is present. His intertriginous areas in his groin are erythematous. Findings are consistent with either mild bacterial cellulitis or fungal infection. Rectal/perineum: No erythema, fluctuance, or induration. Lymphatic: No lymphadenopathy Neurologic: Grossly intact, no focal deficits Psychiatric: Normal mood and affect  Laboratory Data:   Recent Labs  02/08/14 1610  WBC 19.3*  HGB 13.1  HCT 42.8  PLT 594*     Recent Labs  02/08/14 1610  NA 139  K 5.0  CL 103  GLUCOSE 87  BUN 36*  CALCIUM 9.3  CREATININE 1.04     Results for orders placed or performed during the hospital encounter of 02/08/14 (from the past 24 hour(s))  CBC with Differential     Status: Abnormal   Collection Time: 02/08/14  4:10 PM  Result Value Ref Range   WBC 19.3 (H) 4.0 - 10.5 K/uL   RBC 4.89 4.22 - 5.81 MIL/uL   Hemoglobin 13.1 13.0 - 17.0 g/dL   HCT 42.8 39.0 - 52.0 %   MCV 87.5 78.0 - 100.0 fL   MCH 26.8 26.0 - 34.0 pg   MCHC 30.6 30.0 - 36.0 g/dL   RDW 16.9 (H) 11.5 - 15.5 %   Platelets 594 (H) 150 - 400 K/uL   Neutrophils Relative % 84 (H) 43 - 77 %   Neutro Abs 16.2 (H) 1.7 - 7.7 K/uL   Lymphocytes Relative 9 (L) 12 - 46 %   Lymphs Abs 1.6 0.7 - 4.0 K/uL   Monocytes Relative 2 (L) 3 - 12 %   Monocytes Absolute 0.4 0.1 - 1.0 K/uL   Eosinophils Relative 5 0 - 5 %   Eosinophils Absolute 1.0 (H) 0.0 - 0.7 K/uL   Basophils Relative 0 0 - 1 %   Basophils  Absolute 0.0 0.0 - 0.1 K/uL  Basic metabolic panel     Status: Abnormal   Collection Time: 02/08/14  4:10 PM  Result Value Ref Range   Sodium 139 137 - 147 mEq/L   Potassium 5.0 3.7 - 5.3 mEq/L   Chloride 103 96 - 112 mEq/L   CO2 21 19 - 32 mEq/L   Glucose, Bld 87 70 - 99 mg/dL   BUN 36 (H) 6 - 23 mg/dL   Creatinine, Ser 1.04 0.50 - 1.35 mg/dL   Calcium 9.3 8.4 - 10.5 mg/dL   GFR calc non Af Amer 74 (L) >90 mL/min   GFR calc Af Amer 86 (L) >90 mL/min   Anion gap 15 5 - 15  Urinalysis, Routine w reflex microscopic     Status: Abnormal   Collection Time: 02/08/14  6:39 PM  Result Value Ref Range   Color, Urine YELLOW YELLOW   APPearance CLOUDY (A) CLEAR   Specific Gravity, Urine 1.016 1.005 - 1.030   pH 5.5 5.0 - 8.0   Glucose, UA NEGATIVE NEGATIVE mg/dL   Hgb urine dipstick MODERATE (A) NEGATIVE   Bilirubin Urine NEGATIVE NEGATIVE   Ketones, ur NEGATIVE NEGATIVE mg/dL   Protein, ur NEGATIVE NEGATIVE mg/dL   Urobilinogen, UA 1.0 0.0 -  1.0 mg/dL   Nitrite NEGATIVE NEGATIVE   Leukocytes, UA MODERATE (A) NEGATIVE  Urine microscopic-add on     Status: Abnormal   Collection Time: 02/08/14  6:39 PM  Result Value Ref Range   Squamous Epithelial / LPF RARE RARE   WBC, UA 7-10 <3 WBC/hpf   RBC / HPF 3-6 <3 RBC/hpf   Crystals URIC ACID CRYSTALS (A) NEGATIVE   UA likely contaminated.  Renal Function:  Recent Labs  02/08/14 1610  CREATININE 1.04   CrCl cannot be calculated (Unknown ideal weight.).  Radiologic Imaging:  I independently reviewed the above imaging studies.  Impression/Recommendation Cellulitis of the scrotum/genitalia:  He possibly has bacterial cellulitis or fungal infection of the skin. There is no evidence to suggest necrotizing fasciitis or more serious infection.  I would recommend antibiotic therapy which he is already now receiving and topical antifungal therapy.  Will follow with you.  Dejuana Weist,LES 02/08/2014, 9:23 PM    Pryor Curia  MD  CC: Dr. Hal Hope

## 2014-02-08 NOTE — Addendum Note (Signed)
Addended by: Raiford Noble on: 02/08/2014 04:47 PM   Modules accepted: Level of Service

## 2014-02-08 NOTE — H&P (Addendum)
[facility] History and Physical  REYN FAIVRE NWG:[id number] DOB: 1950/04/26 DOA: 02/08/2014  Referring physician: ER physician. PCP: Gwendolyn Grant, MD   Chief Complaint: Right toe bleeding and groin infection.  HPI: Fernando Lane is a 63 y.o. 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 difficile colitis was referred to the ER by patient's primary care physician as patient was found to have skin rash in the groin area concerning for cellulitis. Patient also was noted to have increasing bleeding from his right toe. Patient states 3 days ago he dropped a can of soup on his right toe and since then it was bleeding but for a brief trip. She did stop but started again today and at that point he had gone to his primary care physician. On exam patient does have difficulty retracting his prep use and there is erythema of his penile and scrotal area and also around the groin. Patient's right great toe at this time does not have active bleed but looks slightly swollen. Patient denies any fever chills or diaphoresis. Patient otherwise denies any chest pain shortness of breath nausea vomiting abdominal pain or diarrhea.  Review of Systems: As presented in the history of presenting illness, rest negative.  Past Medical History  Diagnosis Date  . Chronic low back pain   . 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 Chronic neck pain   . OSA (obstructive sleep apnea)     CPAP @ bedtime  . Allergy   . Unspecified essential hypertension     Resistant, 2D Echo - EF-55-60  . Polycythemia   . CAD (coronary artery disease)     Vessel type unspecified  . PVD (peripheral vascular disease)   . DDD (degenerative disc disease), cervical    Past Surgical History  Procedure Laterality Date  . Lower extrmity doppler      [place] 2008; no  evidence for PAD  . US echocardiography  11/2008    Normal valves, mild LAE  . Left knee replacement  10/2006  . Cervical spine surgery  02/2008  . Joint replacement    . Spine surgery    . Amputation Right 12/03/2013    Procedure: AMPUTATION RIGHT FOURTH TOE;  Surgeon: Conrad , MD;  Location: [facility];  Service: Vascular;  Laterality: Right;   Social History:  reports that he quit smoking about 45 years ago. His smoking use included Cigarettes and Pipe. He smoked 0.00 packs per day for 0 years. He has quit using smokeless tobacco. He reports that he drinks about 3.6 oz of alcohol per week. He reports that he does not use illicit drugs. Where does patient live home. Can patient participate in ADLs? Yes.  No Known Allergies  Family History:  Family History  Problem Relation Age of Onset  . Heart attack Father       Prior to Admission medications   Medication Sig Start Date End Date Taking? Authorizing Provider  acetaminophen (TYLENOL) 500 MG tablet Take 500 mg by mouth 2 (two) times daily.   Yes Historical Provider, MD  anagrelide (AGRYLIN) 0.5 MG capsule Take 1 capsule (0.5 mg total) by mouth 2 (two) times daily. 01/30/14  Yes Rowe Clack, MD  aspirin 325 MG tablet Take 1 tablet (325 mg total) by mouth daily. 12/04/13  Yes Donne Hazel, MD  atorvastatin (LIPITOR) 40 MG tablet Take 40 mg by mouth daily.   Yes Historical Provider, MD  benzonatate (TESSALON) 100 MG capsule Take 100 mg by mouth 3 (three) times daily as needed for cough.   Yes Historical Provider, MD  carvedilol (COREG) 3.125 MG tablet Take 1 tablet (3.125 mg total) by mouth 2 (two) times daily with a meal. 12/04/13  Yes Donne Hazel, MD  chlorhexidine (PERIDEX) 0.12 % solution Use as directed 15 mLs in the mouth or throat daily.   Yes Historical Provider, MD  clopidogrel (PLAVIX) 75 MG tablet Take 75 mg by mouth daily at 6 PM.   Yes Historical Provider, MD  colchicine 0.6 MG tablet Take 0.6 mg by mouth daily at 6  PM.   Yes Historical Provider, MD  docusate sodium (COLACE) 100 MG capsule Take 100 mg by mouth 2 (two) times daily.   Yes Historical Provider, MD  folic acid (FOLVITE) 1 MG tablet Take 1 mg by mouth daily as needed (indigestion).    Yes Historical Provider, MD  furosemide (LASIX) 80 MG tablet Take 1 tablet (80 mg total) by mouth 2 (two) times daily. 01/23/14  Yes Mauricio Po, FNP  HYDROcodone-acetaminophen (NORCO) 7.5-325 MG per tablet Take 1 tablet by mouth every 4 (four) hours as needed for moderate pain.   Yes Historical Provider, MD  lactobacillus acidophilus (BACID) TABS tablet Take 2 tablets by mouth 2 (two) times daily.   Yes Historical Provider, MD  lisinopril (PRINIVIL,ZESTRIL) 5 MG tablet Take 1 tablet (5 mg total) by mouth daily. 08/17/13  Yes Geradine Girt, DO  Multiple Vitamins-Minerals (MULTIVITAMIN WITH MINERALS) tablet Take 1 tablet by mouth daily.   Yes Historical Provider, MD  nitroGLYCERIN (NITROSTAT) 0.4 MG SL tablet Place 0.4 mg under the tongue every 5 (five) minutes as needed for chest pain.   Yes Historical Provider, MD  pregabalin (LYRICA) 50 MG capsule Take 50 mg by mouth 3 (three) times daily.   Yes Historical Provider, MD  ROZEREM 8 MG tablet Take 8 mg by mouth at bedtime.  01/18/14  Yes Historical Provider, MD  SANTYL ointment Apply 1 application topically daily as needed (on buttocks).  10/10/13  Yes Historical Provider, MD  senna-docusate (SENOKOT-S) 8.6-50 MG per tablet Take 1 tablet by mouth at bedtime. 12/04/13  Yes Donne Hazel, MD  spironolactone (ALDACTONE) 25 MG tablet Take 1 tablet (25 mg total) by mouth daily. 12/04/13  Yes Donne Hazel, MD  thiamine 100 MG tablet Take 1 tablet (100 mg total) by mouth daily. 08/17/13  Yes Geradine Girt, DO  atorvastatin (LIPITOR) 80 MG tablet Take 1 tablet (80 mg total) by mouth daily at 6 PM. Patient not taking: Reported on 02/08/2014 08/17/13   Tomi Bamberger Vann, DO  ceFEPIme 1 g in dextrose 5 % 50 mL Inject 1 g into the vein  every 8 (eight) hours. Patient not taking: Reported on 02/08/2014 12/04/13   Donne Hazel, MD  feeding supplement, ENSURE COMPLETE, (ENSURE COMPLETE) LIQD Take 237 mLs by mouth 2 (two) times daily between meals. Patient not taking: Reported on 02/08/2014 08/17/13   Geradine Girt, DO  saccharomyces boulardii (FLORASTOR) 250 MG capsule Take 1 capsule (250 mg total) by mouth 2 (two) times daily. Patient not taking: Reported on 02/08/2014 12/04/13   Donne Hazel, MD    Physical Exam: Filed Vitals:   02/08/14 1700 02/08/14 1839 02/08/14 1945 02/08/14 2000  BP:  101/59    Pulse: 83 68  79 80  Temp:      TempSrc:      Resp:  20    SpO2: 96% 100% 94% 94%     General:  Obese not in acute distress.  Eyes: Anicteric no pallor.  ENT: No discharge from the ears eyes nose more.  Neck: No mass felt.  Cardiovascular: S1 and S2 heard.  Respiratory: No rhonchi or crepitations.  Abdomen: Soft nontender bowel sounds present.  Skin: Rash involving the groin and the peeling area. There is also mild erythema in the right great toe.  Musculoskeletal: Right great toe looks swollen.  Psychiatric: Appears normal.  Neurologic: Alert awake oriented to time place and person. Moves all extremities.  Labs on Admission:  Basic Metabolic Panel:  Recent Labs Lab 02/08/14 1610  NA 139  K 5.0  CL 103  CO2 21  GLUCOSE 87  BUN 36*  CREATININE 1.04  CALCIUM 9.3   Liver Function Tests: No results for input(s): AST, ALT, ALKPHOS, BILITOT, PROT, ALBUMIN in the last 168 hours. No results for input(s): LIPASE, AMYLASE in the last 168 hours. No results for input(s): AMMONIA in the last 168 hours. CBC:  Recent Labs Lab 02/08/14 1610  WBC 19.3*  NEUTROABS 16.2*  HGB 13.1  HCT 42.8  MCV 87.5  PLT 594*   Cardiac Enzymes: No results for input(s): CKTOTAL, CKMB, CKMBINDEX, TROPONINI in the last 168 hours.  BNP (last 3 results)  Recent Labs  08/07/13 1600 08/23/13 1759 11/15/13 1951   PROBNP 13375.0* 9383.0* 9486.0*   CBG: No results for input(s): GLUCAP in the last 168 hours.  Radiological Exams on Admission: Dg Toe Great Right  02/08/2014   CLINICAL DATA:  Injury to the right great toe 3 days ago. Pain is located throughout the toe and radiates to the leg.  EXAM: RIGHT GREAT TOE  COMPARISON:  Right foot 08/07/2013  FINDINGS: No evidence for fracture or dislocation. No evidence for a radiopaque foreign body.  IMPRESSION: No acute bone abnormality to the right great toe.   Electronically Signed   By: Markus Daft M.D.   On: 02/08/2014 16:57     Assessment/Plan Principal Problem:   Cellulitis of groin Active Problems:   Obstructive sleep apnea   Essential hypertension   Coronary atherosclerosis   Chronic systolic heart failure   Cardiomyopathy, ischemic   Cellulitis   1. Cellulitis of the groin involving the scrotum and penis - I have discussed with on-call urologist Dr. Franne Grip will be seeing patient in consult. Patient will be placed on vancomycin and Zosyn for now. At this time there is no definite signs of necrotizing fasciitis. Further recommendations per urologist. 2. Right great toe bleeding - right great toe looks slightly inflamed and erythematous. I have ordered MRI to make sure there is no infection. 3. CAD nonoperable - denies any chest pain. 4. History of biventricular heart failure last year measured was 35% - continue Lasix and closely follow intake output and metabolic panel and daily weights. 5. Obstructive sleep apnea - CPAP at bedtime. 6. Polycythemia vera - continue present medications. Closely follow CBC.    Code Status: DO NOT RESUSCITATE. Family Communication: Patient's son at the bedside.  Disposition Plan: Admit to inpatient.    Brandn Mcgath N. [facility] Pager [phone].  If 7PM-7AM, please contact night-coverage [email or web address] Password [facility] 02/08/2014, 8:44 PM

## 2014-02-08 NOTE — Progress Notes (Signed)
Pre visit review using our clinic review tool, if applicable. No additional management support is needed unless otherwise documented below in the visit note. 

## 2014-02-08 NOTE — ED Provider Notes (Signed)
CSN: [id number]     Arrival date & time 02/08/14  1311 History   First MD Initiated Contact with Patient 02/08/14 1505     Chief Complaint  Patient presents with  . Extremity Laceration  . Cellulitis     (Consider location/radiation/quality/duration/timing/severity/associated sxs/prior Treatment) HPI   63yM presenting for evaluation of two wound to L big toe. Happened a few days ago when can of soup fell from shelf onto it. Continued intermittent bleeding. Is on plavix. Additionally complaining of scrotal pain. Rash. Denies trauma. No urinary complaints. No fever or chills. No n/v.   Past Medical History  Diagnosis Date  . Chronic low back pain   . 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 Chronic neck pain   . OSA (obstructive sleep apnea)     CPAP @ bedtime  . Allergy   . Unspecified essential hypertension     Resistant, 2D Echo - [id number]  . Polycythemia   . CAD (coronary artery disease)     Vessel type unspecified  . PVD (peripheral vascular disease)   . DDD (degenerative disc disease), cervical    Past Surgical History  Procedure Laterality Date  . Lower extrmity doppler      [place] 2008; no evidence for PAD  . US echocardiography  11/2008    Normal valves, mild LAE  . Left knee replacement  10/2006  . Cervical spine surgery  02/2008  . Joint replacement    . Spine surgery    . Amputation Right 12/03/2013    Procedure: AMPUTATION RIGHT FOURTH TOE;  Surgeon: Conrad Panguitch, MD;  Location: [facility];  Service: Vascular;  Laterality: Right;   Family History  Problem Relation Age of Onset  . Heart attack Father    History  Substance Use Topics  . Smoking status: Former Smoker -- 0 years    Types: Cigarettes, Pipe    Quit date: 03/15/1968  . Smokeless tobacco: Former Systems developer     Comment: Pt quit Cigarettes 1970's- and cigars & chew 1990  . Alcohol Use: 3.6 oz/week    6 Cans of beer per week    Review of Systems  All systems  reviewed and negative, other than as noted in HPI.   Allergies  Review of patient's allergies indicates no known allergies.  Home Medications   Prior to Admission medications   Medication Sig Start Date End Date Taking? Authorizing Provider  acetaminophen (TYLENOL) 500 MG tablet Take 500 mg by mouth 2 (two) times daily.    Historical Provider, MD  anagrelide (AGRYLIN) 0.5 MG capsule Take 1 capsule (0.5 mg total) by mouth 2 (two) times daily. 01/30/14   Rowe Clack, MD  aspirin 325 MG tablet Take 1 tablet (325 mg total) by mouth daily. 12/04/13   Donne Hazel, MD  atorvastatin (LIPITOR) 40 MG tablet Take 40 mg by mouth daily.    Historical Provider, MD  atorvastatin (LIPITOR) 80 MG tablet Take 1 tablet (80 mg total) by mouth daily at 6 PM. 08/17/13   Geradine Girt, DO  benzonatate (TESSALON) 100 MG capsule Take 100 mg by mouth 3 (three) times daily as needed for cough.    Historical Provider, MD  carvedilol (COREG) 3.125 MG tablet Take 1 tablet (3.125 mg total) by mouth 2 (two) times daily with a meal. 12/04/13   Donne Hazel, MD  ceFEPIme 1 g in dextrose 5 % 50 mL Inject 1 g into the vein every  8 (eight) hours. 12/04/13   Donne Hazel, MD  chlorhexidine (PERIDEX) 0.12 % solution Use as directed 15 mLs in the mouth or throat daily.    Historical Provider, MD  chlorpheniramine-HYDROcodone (TUSSIONEX) 10-8 MG/5ML LQCR Take 5 mLs by mouth every 12 (twelve) hours as needed for cough. 08/17/13   Geradine Girt, DO  clopidogrel (PLAVIX) 75 MG tablet Take 75 mg by mouth daily at 6 PM.    Historical Provider, MD  colchicine 0.6 MG tablet Take 0.6 mg by mouth daily at 6 PM.    Historical Provider, MD  docusate sodium (COLACE) 100 MG capsule Take 100 mg by mouth 2 (two) times daily.    Historical Provider, MD  feeding supplement, ENSURE COMPLETE, (ENSURE COMPLETE) LIQD Take 237 mLs by mouth 2 (two) times daily between meals. 08/17/13   Geradine Girt, DO  folic acid (FOLVITE) 1 MG tablet Take 1 mg  by mouth daily as needed (indigestion).     Historical Provider, MD  furosemide (LASIX) 80 MG tablet Take 1 tablet (80 mg total) by mouth 2 (two) times daily. 01/23/14   Mauricio Po, FNP  HYDROcodone-acetaminophen (NORCO) 7.5-325 MG per tablet Take 1 tablet by mouth every 4 (four) hours as needed for moderate pain.    Historical Provider, MD  lactobacillus acidophilus (BACID) TABS tablet Take 2 tablets by mouth 2 (two) times daily.    Historical Provider, MD  lisinopril (PRINIVIL,ZESTRIL) 5 MG tablet Take 1 tablet (5 mg total) by mouth daily. 08/17/13   Geradine Girt, DO  Multiple Vitamins-Minerals (MULTIVITAMIN WITH MINERALS) tablet Take 1 tablet by mouth daily.    Historical Provider, MD  nitroGLYCERIN (NITROSTAT) 0.4 MG SL tablet Place 0.4 mg under the tongue every 5 (five) minutes as needed for chest pain.    Historical Provider, MD  pregabalin (LYRICA) 50 MG capsule Take 50 mg by mouth 3 (three) times daily.    Historical Provider, MD  saccharomyces boulardii (FLORASTOR) 250 MG capsule Take 1 capsule (250 mg total) by mouth 2 (two) times daily. 12/04/13   Donne Hazel, MD  SANTYL ointment Apply 1 application topically daily as needed (on buttocks).  10/10/13   Historical Provider, MD  senna-docusate (SENOKOT-S) 8.6-50 MG per tablet Take 1 tablet by mouth at bedtime. 12/04/13   Donne Hazel, MD  spironolactone (ALDACTONE) 25 MG tablet Take 1 tablet (25 mg total) by mouth daily. 12/04/13   Donne Hazel, MD  thiamine 100 MG tablet Take 1 tablet (100 mg total) by mouth daily. 08/17/13   Geradine Girt, DO   BP 98/85 mmHg  Pulse 83  Temp(Src) 97.9 F (36.6 C) (Oral)  Resp 16 Physical Exam  Constitutional: He appears well-developed and well-nourished.  Laying in bed. Appears uncomfortable. Obese.   HENT:  Head: Normocephalic and atraumatic.  Eyes: Conjunctivae are normal. Right eye exhibits no discharge. Left eye exhibits no discharge.  Neck: Neck supple.  Cardiovascular: Normal rate,  regular rhythm and normal heart sounds.  Exam reveals no gallop and no friction rub.   No murmur heard. Pulmonary/Chest: Effort normal and breath sounds normal. No respiratory distress.  Abdominal: Soft. He exhibits no distension. There is no tenderness.  Genitourinary:  Penis is mildly swollen.  Overlying skin seems somewhat thickened and tender to palpation.  No ulcerations vesicles or pustules noted. Uncircumcised. Foreskin retractable although couldn't tolerate it completely because of pain. Meatus unremarkable. No discharge noted. Skin changes around crease of thighs and base of scrotum  more consistent with intertrigo.   Musculoskeletal: He exhibits no edema or tenderness.  Neurological: He is alert.  Skin:  Chronic appearing skin changes of b/l lower legs, likely venous stasis. There is increased warmth and tenderness concerning for cellulitis. Small nonbleeding laceration over dorsal aspect R big toe. No drainage. Nail missing. B/l feet warm. Cap refill somewhat delayed.   Psychiatric: He has a normal mood and affect. His behavior is normal. Thought content normal.  Nursing note and vitals reviewed.   ED Course  Procedures (including critical care time) Labs Review Labs Reviewed  MRSA PCR SCREENING - Abnormal; Notable for the following:    MRSA by PCR POSITIVE (*)    All other components within normal limits  CBC WITH DIFFERENTIAL - Abnormal; Notable for the following:    WBC 19.3 (*)    RDW 16.9 (*)    Platelets 594 (*)    Neutrophils Relative % 84 (*)    Neutro Abs 16.2 (*)    Lymphocytes Relative 9 (*)    Monocytes Relative 2 (*)    Eosinophils Absolute 1.0 (*)    All other components within normal limits  BASIC METABOLIC PANEL - Abnormal; Notable for the following:    BUN 36 (*)    GFR calc non Af Amer 74 (*)    GFR calc Af Amer 86 (*)    All other components within normal limits  URINALYSIS, ROUTINE W REFLEX MICROSCOPIC - Abnormal; Notable for the following:     APPearance CLOUDY (*)    Hgb urine dipstick MODERATE (*)    Leukocytes, UA MODERATE (*)    All other components within normal limits  URINE MICROSCOPIC-ADD ON - Abnormal; Notable for the following:    Crystals URIC ACID CRYSTALS (*)    All other components within normal limits  COMPREHENSIVE METABOLIC PANEL - Abnormal; Notable for the following:    Glucose, Bld 116 (*)    BUN 30 (*)    Albumin 2.9 (*)    Alkaline Phosphatase 144 (*)    GFR calc non Af Amer 78 (*)    GFR calc Af Amer 90 (*)    All other components within normal limits  CBC WITH DIFFERENTIAL - Abnormal; Notable for the following:    WBC 17.4 (*)    Hemoglobin 11.8 (*)    MCH 25.8 (*)    MCHC 29.7 (*)    RDW 17.0 (*)    Platelets 561 (*)    Neutrophils Relative % 87 (*)    Neutro Abs 15.1 (*)    Lymphocytes Relative 6 (*)    Monocytes Relative 2 (*)    Eosinophils Absolute 0.9 (*)    All other components within normal limits  CBC - Abnormal; Notable for the following:    WBC 17.2 (*)    Hemoglobin 11.9 (*)    MCH 25.7 (*)    MCHC 29.9 (*)    RDW 17.4 (*)    Platelets 512 (*)    All other components within normal limits  BASIC METABOLIC PANEL - Abnormal; Notable for the following:    Sodium 136 (*)    BUN 42 (*)    Creatinine, Ser 2.03 (*)    GFR calc non Af Amer 33 (*)    GFR calc Af Amer 38 (*)    Anion gap 16 (*)    All other components within normal limits  BASIC METABOLIC PANEL - Abnormal; Notable for the following:    BUN 51 (*)  Creatinine, Ser 2.02 (*)    GFR calc non Af Amer 33 (*)    GFR calc Af Amer 38 (*)    All other components within normal limits  CBC - Abnormal; Notable for the following:    WBC 16.7 (*)    Hemoglobin 11.9 (*)    MCH 25.7 (*)    RDW 17.6 (*)    Platelets 482 (*)    All other components within normal limits  CLOSTRIDIUM DIFFICILE BY PCR  SEDIMENTATION RATE  CBC  BASIC METABOLIC PANEL    Imaging Review No results found.   EKG Interpretation None       MDM   Final diagnoses:  Pain  bilateral lower extremity cellulitis scrotal cellulitis  64yM with b/l leg and groin pain. Apparently sent for R big toe lac, but no active bleeding now. Chronic stasis changes in b/l LE with superimposed cellulitis. Additionally, intertrigo groin with superimposed cellulitis of groin including penis. Uncircumcised. No phimosis/paraphimosis. Afebrile. Nontoxic. Given extent of cellulitis with PVD and genital involvement, recommending admission. Pt is very hesitant though. Is agreeable to dose IV abx in ED and pain control. Will reassess. If still insistent on discharge, then will DC with oral abx although I do not feel this is ideal.     Virgel Manifold, MD 02/13/14 [id number]

## 2014-02-09 DIAGNOSIS — I5022 Chronic systolic (congestive) heart failure: Secondary | ICD-10-CM

## 2014-02-09 LAB — CBC WITH DIFFERENTIAL/PLATELET
BASOS PCT: 0 % (ref 0–1)
Basophils Absolute: 0 10*3/uL (ref 0.0–0.1)
EOS ABS: 0.9 10*3/uL — AB (ref 0.0–0.7)
Eosinophils Relative: 5 % (ref 0–5)
HCT: 39.7 % (ref 39.0–52.0)
Hemoglobin: 11.8 g/dL — ABNORMAL LOW (ref 13.0–17.0)
Lymphocytes Relative: 6 % — ABNORMAL LOW (ref 12–46)
Lymphs Abs: 1 10*3/uL (ref 0.7–4.0)
MCH: 25.8 pg — AB (ref 26.0–34.0)
MCHC: 29.7 g/dL — AB (ref 30.0–36.0)
MCV: 86.9 fL (ref 78.0–100.0)
MONOS PCT: 2 % — AB (ref 3–12)
Monocytes Absolute: 0.4 10*3/uL (ref 0.1–1.0)
Neutro Abs: 15.1 10*3/uL — ABNORMAL HIGH (ref 1.7–7.7)
Neutrophils Relative %: 87 % — ABNORMAL HIGH (ref 43–77)
PLATELETS: 561 10*3/uL — AB (ref 150–400)
RBC: 4.57 MIL/uL (ref 4.22–5.81)
RDW: 17 % — ABNORMAL HIGH (ref 11.5–15.5)
WBC: 17.4 10*3/uL — ABNORMAL HIGH (ref 4.0–10.5)

## 2014-02-09 LAB — COMPREHENSIVE METABOLIC PANEL
ALK PHOS: 144 U/L — AB (ref 39–117)
ALT: 8 U/L (ref 0–53)
AST: 12 U/L (ref 0–37)
Albumin: 2.9 g/dL — ABNORMAL LOW (ref 3.5–5.2)
Anion gap: 13 (ref 5–15)
BUN: 30 mg/dL — ABNORMAL HIGH (ref 6–23)
CO2: 24 meq/L (ref 19–32)
Calcium: 8.8 mg/dL (ref 8.4–10.5)
Chloride: 103 mEq/L (ref 96–112)
Creatinine, Ser: 1 mg/dL (ref 0.50–1.35)
GFR calc Af Amer: 90 mL/min — ABNORMAL LOW (ref >90)
GFR, EST NON AFRICAN AMERICAN: 78 mL/min — AB (ref >90)
Glucose, Bld: 116 mg/dL — ABNORMAL HIGH (ref 70–99)
Potassium: 3.8 mEq/L (ref 3.7–5.3)
SODIUM: 140 meq/L (ref 137–147)
Total Bilirubin: 1 mg/dL (ref 0.3–1.2)
Total Protein: 6.1 g/dL (ref 6.0–8.3)

## 2014-02-09 LAB — SEDIMENTATION RATE: Sed Rate: 5 mm/hr (ref 0–16)

## 2014-02-09 LAB — MRSA PCR SCREENING: MRSA by PCR: POSITIVE — AB

## 2014-02-09 NOTE — Consult Note (Signed)
  Subjective: Patient reports feeling much better today.  Specifically he said his groin/genital pain is improved.  Objective: Vital signs in last 24 hours: Temp:  [97.4 F (36.3 C)-98.8 F (37.1 C)] 97.7 F (36.5 C) (11/28 1324) Pulse Rate:  [68-88] 80 (11/28 1324) Resp:  [18-20] 18 (11/28 1324) BP: (90-109)/(57-73) 90/57 mmHg (11/28 1324) SpO2:  [94 %-100 %] 97 % (11/28 1324) Weight:  [106.6 kg (235 lb 0.2 oz)] 106.6 kg (235 lb 0.2 oz) (11/27 2310)  Intake/Output from previous day: 11/27 0701 - 11/28 0700 In: -  Out: 600 [Urine:600] Intake/Output this shift: Total I/O In: 240 [P.O.:240] Out: 300 [Urine:300]  Physical Exam:  General:alert and no distress GI: His abdomen is soft with some tenderness to palpation in both right and left groin regions but no evidence of fluctuance or crepitus. GU: He has penile edema without erythema, tenderness or crepitus.The scrotum reveals mild edema and mild erythema with minimal tenderness and no fluctuance or crepitus.  Lab Results:  Recent Labs  02/08/14 1610 02/09/14 0440  HGB 13.1 11.8*  HCT 42.8 39.7   BMET  Recent Labs  02/08/14 1610 02/09/14 0440  NA 139 140  K 5.0 3.8  CL 103 103  CO2 21 24  GLUCOSE 87 116*  BUN 36* 30*  CREATININE 1.04 1.00  CALCIUM 9.3 8.8   No results for input(s): LABPT, INR in the last 72 hours. No results for input(s): LABURIN in the last 72 hours. Results for orders placed or performed during the hospital encounter of 02/08/14  MRSA PCR Screening     Status: Abnormal   Collection Time: 02/08/14 11:23 PM  Result Value Ref Range Status   MRSA by PCR POSITIVE (A) NEGATIVE Final    Comment:        The GeneXpert MRSA Assay (FDA approved for NASAL specimens only), is one component of a comprehensive MRSA colonization surveillance program. It is not intended to diagnose MRSA infection nor to guide or monitor treatment for MRSA infections. RESULT CALLED TO, READ BACK BY AND VERIFIED  WITH: RUSSELL,J RN AT 0139 11.28.15 BY TIBBITTS,K     Studies/Results: Dg Toe Great Right  02/08/2014   CLINICAL DATA:  Injury to the right great toe 3 days ago. Pain is located throughout the toe and radiates to the leg.  EXAM: RIGHT GREAT TOE  COMPARISON:  Right foot 08/07/2013  FINDINGS: No evidence for fracture or dislocation. No evidence for a radiopaque foreign body.  IMPRESSION: No acute bone abnormality to the right great toe.   Electronically Signed   By: Markus Daft M.D.   On: 02/08/2014 16:57    Assessment/Plan: As far as his inguinal and genital cellulitis goes he has improved subjectively with decrease discomfort and objectively has no sign of any abscess formation or Fournier's gangrene.  His white count has decreased and he has had no fever. There does not appear to be any indication for surgical intervention at this time as he appears to have an improving cellulitis.    Continue current Broad-spectrum antibiotics.    LOS: 1 day   Lelani Garnett C 02/09/2014, 2:06 PM

## 2014-02-09 NOTE — Progress Notes (Signed)
Patient ID: Fernando Lane, male   DOB: [birth year], 63 y.o.   MRN: [id number] [facility] PROGRESS NOTE  RAKEEM COLLEY TGY:[id number] DOB: Apr 25, 1950 DOA: 02/08/2014 PCP: Gwendolyn Grant, MD  Brief narrative:    63 y.o. 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 difficile colitis. Patient was referred to [facility] ED for evaluation of groin rash concerning for an infection. Patient also noted increasing bleeding from his right toe. On physical exam, patient was noted to have penile and scrotal erythema and in regards to right great toe there was no bleeding but it was swollen.Pt started on broad spectrum abx, vanco and zosyn for treatment of groin and toe infection.   Assessment/Plan:    Principal Problem:   Cellulitis of groin - for now we will continue broad spectrum antibiotics, vanco and zosyn - WBC count is trending down from 19.3 to 17.4 this am - appreciate GU recommendations  - appreciate wound care assessment  Active Problems: Right great toe infection - broad spectrum abx will cover for toe infection - will continue to monitor for bleeds    Obstructive sleep apnea - on CPAP   Essential hypertension - continue coreg 3.125 mg PO BID, lasix, lisinopril - normal renal function    Chronic systolic heart failure / Cardiomyopathy, ischemic - continue current meds: coreg, lasix, ACEi, spironolactone  - continue aspirin and plavix   H/O PVD - continue lyrica, aspirin    Polycythemia - continue anagrelide 0.5 mg PO BID   Dyslipidemia - continue statin therapy    Morbid obesity - nutrition consulted    H/O C.diff - on florastor and lactobacillus   DVT Prophylaxis  - SCD's bilaterally for DVT prophylaxis   Code Status: DNR/DNI Family Communication:  plan of care discussed with the patient Disposition Plan: Home  when stable.   IV access:   Peripheral IV  Procedures and diagnostic studies:    Dg Toe Great Right 02/08/2014    No acute bone abnormality to the right great toe.      Medical Consultants:   Urology  Other Consultants:   [place]  IAnti-Infectives:    vanco 02/08/2014 -->  Zosyn 02/08/2014 -->   Leisa Lenz, MD  [facility] Pager [phone]  If 7PM-7AM, please contact night-coverage [email or web address] Password [facility] 02/09/2014, 11:31 AM   LOS: 1 day    HPI/Subjective: No acute overnight events.  Objective: Filed Vitals:   02/08/14 2000 02/08/14 2111 02/08/14 2310 02/09/14 0545  BP:  96/64 107/73 97/65  Pulse: 80 80 78 88  Temp:  98.8 F (37.1 C) 97.4 F (36.3 C) 98.3 F (36.8 C)  TempSrc:  Oral Oral Oral  Resp:  18 20 20   Height:   5\' 6"  (1.676 m)   Weight:   106.6 kg (235 lb 0.2 oz)   SpO2: 94% 97% 100% 96%    Intake/Output Summary (Last 24 hours) at 02/09/14 1131 Last data filed at 02/09/14 0400  Gross per 24 hour  Intake      0 ml  Output    600 ml  Net   -600 ml    Exam:   General:  Pt is alert, follows commands appropriately, not in acute distress  Cardiovascular: Regular rate and rhythm, S1/S2 appreciated   Respiratory: Clear to auscultation bilaterally, no wheezing, no crackles, no rhonchi  Abdomen: Soft, non tender,  non distended, bowel sounds present  GU/gential: penile and scrotal erythema, edema  Extremities: pulses DP and PT palpable bilaterally  Neuro: Grossly nonfocal  Data Reviewed: Basic Metabolic Panel:  Recent Labs Lab 02/08/14 1610 02/09/14 0440  NA 139 140  K 5.0 3.8  CL 103 103  CO2 21 24  GLUCOSE 87 116*  BUN 36* 30*  CREATININE 1.04 1.00  CALCIUM 9.3 8.8   Liver Function Tests:  Recent Labs Lab 02/09/14 0440  AST 12  ALT 8  ALKPHOS 144*  BILITOT 1.0  PROT 6.1  ALBUMIN 2.9*   No results for input(s): LIPASE, AMYLASE in the last 168 hours. No results for input(s): AMMONIA in the last 168  hours. CBC:  Recent Labs Lab 02/08/14 1610 02/09/14 0440  WBC 19.3* 17.4*  NEUTROABS 16.2* 15.1*  HGB 13.1 11.8*  HCT 42.8 39.7  MCV 87.5 86.9  PLT 594* 561*   Cardiac Enzymes: No results for input(s): CKTOTAL, CKMB, CKMBINDEX, TROPONINI in the last 168 hours. BNP: Invalid input(s): POCBNP CBG: No results for input(s): GLUCAP in the last 168 hours.  MRSA PCR Screening     Status: Abnormal   Collection Time: 02/08/14 11:23 PM  Result Value Ref Range Status   MRSA by PCR POSITIVE (A) NEGATIVE Final          Scheduled Meds: . anagrelide  0.5 mg Oral BID  . aspirin  325 mg Oral Daily  . atorvastatin  40 mg Oral Daily  . carvedilol  3.125 mg Oral BID WC  . clopidogrel  75 mg Oral q1800  . colchicine  0.6 mg Oral q1800  . docusate sodium  100 mg Oral BID  . feeding supplement   237 mL Oral BID BM  . furosemide  80 mg Oral BID  . lactobacillus acidophilus  2 tablet Oral BID  . lisinopril  5 mg Oral Daily  . multivitamin w  1 tablet Oral Daily  . piperacillin-tazobactam (ZOSYN)  IV  3.375 g Intravenous 3 times per day  . pregabalin  50 mg Oral TID  . ramelteon  8 mg Oral QHS  . saccharomyces b  250 mg Oral BID  . senna-docusate  1 tablet Oral QHS  . spironolactone  25 mg Oral Daily  . thiamine  100 mg Oral Daily  . vancomycin  1,000 mg Intravenous Q12H

## 2014-02-09 NOTE — Plan of Care (Signed)
Problem: Phase I Progression Outcomes Goal: Voiding-avoid urinary catheter unless indicated Outcome: Completed/Met Date Met:  02/09/14     

## 2014-02-09 NOTE — Plan of Care (Signed)
Problem: Consults Goal: General Medical Patient Education See Patient Education Module for specific education. Outcome: Completed/Met Date Met:  02/09/14  Problem: Phase I Progression Outcomes Goal: Pain controlled with appropriate interventions Outcome: Completed/Met Date Met:  02/09/14 Goal: OOB as tolerated unless otherwise ordered Outcome: Progressing

## 2014-02-09 NOTE — Plan of Care (Signed)
Problem: Phase I Progression Outcomes Goal: Pain controlled with appropriate interventions Outcome: Progressing     

## 2014-02-09 NOTE — Progress Notes (Signed)
RT placed pt on auto titrate CPAP 6-20cmH2O via ffm. Sterile water was added for humidification. Pt states he is comfortable and is tolerating CPAP well at this time. RT will continue to monitor as needed.

## 2014-02-09 NOTE — Plan of Care (Signed)
Problem: Phase I Progression Outcomes Goal: Hemodynamically stable Outcome: Progressing     

## 2014-02-09 NOTE — Plan of Care (Signed)
Problem: Phase I Progression Outcomes Goal: OOB as tolerated unless otherwise ordered Outcome: Not Progressing     

## 2014-02-10 DIAGNOSIS — I739 Peripheral vascular disease, unspecified: Secondary | ICD-10-CM

## 2014-02-10 MED ORDER — PRO-STAT SUGAR FREE PO LIQD
30.0000 mL | Freq: Every day | ORAL | Status: DC | PRN
  Filled 2014-02-10: qty 30

## 2014-02-10 MED ORDER — FUROSEMIDE 80 MG PO TABS
80.0000 mg | ORAL_TABLET | Freq: Every day | ORAL | Status: DC
  Administered 2014-02-11: 80 mg via ORAL
  Filled 2014-02-10: qty 1

## 2014-02-10 NOTE — Progress Notes (Addendum)
Patient ID: Fernando Lane, male   DOB: 06-Nov-1950, 63 y.o.   MRN: [id number] [facility] PROGRESS NOTE  Fernando Lane OIZ:[id number] DOB: 16-Jun-1950 DOA: 02/08/2014 PCP: Gwendolyn Grant, MD  Brief narrative:    63 y.o. 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 difficile colitis. Patient was referred to [facility] ED for evaluation of groin rash concerning for an infection. Patient also noted increasing bleeding from his right toe. On physical exam, patient was noted to have penile and scrotal erythema and in regards to right great toe there was no bleeding but it was swollen.Pt started on broad spectrum abx, vanco and zosyn for treatment of groin and LE infection.   Assessment/Plan:    Principal Problem:  Cellulitis of groin - continue broad spectrum antibiotics, vanco and zosyn - WBC count is trending down from 19.3 to 17.4  - appreciate wound care assessment  Active Problems: LE cellulitis  - broad spectrum abx will cover for LE cellulitis  - will continue to monitor for bleeds   Obstructive sleep apnea - on CPAP  Essential hypertension - continue coreg 3.125 mg PO BID, lasix, lisinopril - normal renal function   Chronic systolic heart failure / Cardiomyopathy, ischemic - continue coreg, lasix, ACEi, spironolactone - will change lasix to once a day regimen instead of twice d day due to soft BP - continue aspirin and plavix  H/O PVD - continue lyrica, aspirin   Polycythemia - continue anagrelide 0.5 mg PO BID  Dyslipidemia - continue statin therapy   Morbid obesity - nutrition consulted   H/O C.diff - on florastor and lactobacillus   DVT Prophylaxis  - SCD's bilaterally for DVT prophylaxis   Code Status: DNR/DNI Family Communication: plan of care discussed with the patient Disposition Plan:  Home when stable.    IV access:   Peripheral IV  Procedures and diagnostic studies:    Dg Toe Great Right 02/08/2014 No acute bone abnormality to the right great toe.   Medical Consultants:   GU Other Consultants:   [place] IAnti-Infectives:    Vanco 02/08/2014 -->  Zosyn 02/08/2014 -->   Leisa Lenz, MD  [facility] Pager [phone]  If 7PM-7AM, please contact night-coverage [email or web address] Password [email or web address] 02/10/2014, 11:07 AM   LOS: 2 days    HPI/Subjective: No acute overnight events.  Objective: Filed Vitals:   02/09/14 1324 02/09/14 2048 02/09/14 2138 02/10/14 0403  BP: 90/57 85/57  96/68  Pulse: 80 67 73 71  Temp: 97.7 F (36.5 C) 97.4 F (36.3 C)  98.7 F (37.1 C)  TempSrc: Oral Oral  Oral  Resp: 18 18 18 16   Height:      Weight:      SpO2: 97% 98% 97% 98%    Intake/Output Summary (Last 24 hours) at 02/10/14 1107 Last data filed at 02/10/14 1000  Gross per 24 hour  Intake    363 ml  Output    600 ml  Net   -237 ml    Exam:   General:  Pt is alert, follows commands appropriately, not in acute distress  Cardiovascular: Regular rate and rhythm, S1/S2, no murmurs  Respiratory: Clear to auscultation bilaterally, no wheezing, no crackles, no rhonchi  Abdomen: Soft, non tender, non distended, bowel sounds present  Extremities: pulses DP and PT palpable bilaterally; bilateral LE dressing in place with exposed area of  erythema  GU: scrotal swelling, erythema and TTP  Neuro: Grossly nonfocal  Data Reviewed: Basic Metabolic Panel:  Recent Labs Lab 02/08/14 1610 02/09/14 0440  NA 139 140  K 5.0 3.8  CL 103 103  CO2 21 24  GLUCOSE 87 116*  BUN 36* 30*  CREATININE 1.04 1.00  CALCIUM 9.3 8.8   Liver Function Tests:  Recent Labs Lab 02/09/14 0440  AST 12  ALT 8  ALKPHOS 144*  BILITOT 1.0  PROT 6.1  ALBUMIN 2.9*   No results for input(s): LIPASE, AMYLASE in the last 168 hours. No results for input(s): AMMONIA in the  last 168 hours. CBC:  Recent Labs Lab 02/08/14 1610 02/09/14 0440  WBC 19.3* 17.4*  NEUTROABS 16.2* 15.1*  HGB 13.1 11.8*  HCT 42.8 39.7  MCV 87.5 86.9  PLT 594* 561*   Cardiac Enzymes: No results for input(s): CKTOTAL, CKMB, CKMBINDEX, TROPONINI in the last 168 hours. BNP: Invalid input(s): POCBNP CBG: No results for input(s): GLUCAP in the last 168 hours.  Recent Results (from the past 240 hour(s))  MRSA PCR Screening     Status: Abnormal   Collection Time: 02/08/14 11:23 PM  Result Value Ref Range Status   MRSA by PCR POSITIVE (A) NEGATIVE Final          Scheduled Meds: . anagrelide  0.5 mg Oral BID  . aspirin  325 mg Oral Daily  . atorvastatin  40 mg Oral Daily  . carvedilol  3.125 mg Oral BID WC  . clopidogrel  75 mg Oral q1800  . colchicine  0.6 mg Oral q1800  . docusate sodium  100 mg Oral BID  . feeding supplement (ENSURE COMPLETE)  237 mL Oral BID BM  . furosemide  80 mg Oral BID  . lactobacillus acidophilus  2 tablet Oral BID  . lisinopril  5 mg Oral Daily  . multivitamin with minerals  1 tablet Oral Daily  . piperacillin-tazobactam (ZOSYN)  IV  3.375 g Intravenous 3 times per day  . pregabalin  50 mg Oral TID  . ramelteon  8 mg Oral QHS  . saccharomyces boulardii  250 mg Oral BID  . senna-docusate  1 tablet Oral QHS  . spironolactone  25 mg Oral Daily  . thiamine  100 mg Oral Daily  . vancomycin  1,000 mg Intravenous Q12H

## 2014-02-10 NOTE — Progress Notes (Signed)
  Subjective: Patient reports the groin/genital swelling has diminished however he said he's having an increase in discomfort in the inner thigh region bilaterally. No new scrotal or groin pain.  Objective: Vital signs in last 24 hours: Temp:  [97.4 F (36.3 C)-98.7 F (37.1 C)] 98.7 F (37.1 C) (11/29 0403) Pulse Rate:  [67-80] 71 (11/29 0403) Resp:  [16-18] 16 (11/29 0403) BP: (85-96)/(57-68) 96/68 mmHg (11/29 0403) SpO2:  [97 %-98 %] 98 % (11/29 0403)  Intake/Output from previous day: 11/28 0701 - 11/29 0700 In: 600 [P.O.:600] Out: 700 [Urine:700] Intake/Output this shift:    Physical Exam:  There is a decrease in the amount of penile and scrotal edema noted. The groins reveal a decrease in tenderness and erythema. The inner thigh regions bilaterally appear to have either some chronic, brawny induration versus possible tenia cruris. There is no evidence of abscess or Fournier's gangrene. Lab Results:  Recent Labs  02/08/14 1610 02/09/14 0440  HGB 13.1 11.8*  HCT 42.8 39.7   BMET  Recent Labs  02/08/14 1610 02/09/14 0440  NA 139 140  K 5.0 3.8  CL 103 103  CO2 21 24  GLUCOSE 87 116*  BUN 36* 30*  CREATININE 1.04 1.00  CALCIUM 9.3 8.8   No results for input(s): LABPT, INR in the last 72 hours. No results for input(s): LABURIN in the last 72 hours. Results for orders placed or performed during the hospital encounter of 02/08/14  MRSA PCR Screening     Status: Abnormal   Collection Time: 02/08/14 11:23 PM  Result Value Ref Range Status   MRSA by PCR POSITIVE (A) NEGATIVE Final    Comment:        The GeneXpert MRSA Assay (FDA approved for NASAL specimens only), is one component of a comprehensive MRSA colonization surveillance program. It is not intended to diagnose MRSA infection nor to guide or monitor treatment for MRSA infections. RESULT CALLED TO, READ BACK BY AND VERIFIED WITH: RUSSELL,J RN AT 0139 11.28.15 BY TIBBITTS,K      Studies/Results: Dg Toe Great Right  02/08/2014   CLINICAL DATA:  Injury to the right great toe 3 days ago. Pain is located throughout the toe and radiates to the leg.  EXAM: RIGHT GREAT TOE  COMPARISON:  Right foot 08/07/2013  FINDINGS: No evidence for fracture or dislocation. No evidence for a radiopaque foreign body.  IMPRESSION: No acute bone abnormality to the right great toe.   Electronically Signed   By: Markus Daft M.D.   On: 02/08/2014 16:57    Assessment/Plan: It appears the cellulitis has improved however he does appear to have some irritation in the area of area of the inner thighs bilaterally. This appears to possibly be either chronic changes to the skin or possibly a monilial infection. There does not appear to be any evidence of progression of his skin infection to Fournier's gangrene or abscess formation at this time.   I have given him an antifungal powder to applied to the inner thigh regions PRN.   LOS: 2 days   Clotiel Troop C 02/10/2014, 9:07 AM

## 2014-02-10 NOTE — Progress Notes (Signed)
RT placed pt on auto titrate CPAP 6-20cmH2O via ffm. Sterile water was added for humidification. Pt states he is comfortable and is tolerating CPAP well at this time. RT will continue to monitor as needed.

## 2014-02-10 NOTE — Progress Notes (Signed)
Received verbal clarification from [facility] MD that patient's MRI could be postponed until tomorrow.

## 2014-02-10 NOTE — Progress Notes (Signed)
INITIAL NUTRITION ASSESSMENT  DOCUMENTATION CODES Per approved criteria  -Obesity Unspecified   INTERVENTION:  Continue to provide Ensure Complete po BID, each supplement provides 350 kcal and 13 grams of protein  Provide Prostat liquid protein PO 30 ml daily PRN, each supplement provides 100 kcal, 15 grams protein.  Encourage PO intake  RD to continue to monitor  NUTRITION DIAGNOSIS: Increased nutrient (protein) needs related to wound healing as evidenced by multiple open wounds and pressure ulcers.   Goal: Pt to meet >/= 90% of their estimated nutrition needs   Monitor:  PO and supplemental intake, weight, labs, I/O's  Reason for Assessment: consult for diet education  Admitting Dx: Cellulitis of groin  ASSESSMENT: 63 y.o. 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 difficile colitis.  Pt reports decreased appetite for the past week. Pt is receiving Ensure supplements BID. Pt reports no weight loss. Pt with multiple wounds and pressure ulcers, would benefit from additional protein supplement. Pt is willing to try Prostat supplement mixed with beverage of choice. RD to order.  Encouraged pt to include protein food items with each meal and explained to pt what protein foods are. Discussed with pt the importance of eating 3 balanced meals a day including a variety of non-starchy vegetables.   Nutrition focused physical exam shows no sign of depletion of muscle mass or body fat.  Labs reviewed: Elevated BUN Glucose 116  Height: Ht Readings from Last 1 Encounters:  02/08/14 5\' 6"  (1.676 m)    Weight: Wt Readings from Last 1 Encounters:  02/08/14 235 lb 0.2 oz (106.6 kg)    Ideal Body Weight: 142 lb  % Ideal Body Weight: 165%  Wt Readings from Last 10 Encounters:  02/08/14 235 lb 0.2 oz (106.6 kg)   01/23/14 216 lb 1.9 oz (98.031 kg)  12/27/13 192 lb (87.091 kg)  12/04/13 192 lb 14.4 oz (87.5 kg)  10/11/13 233 lb (105.688 kg)  08/30/13 212 lb 4.9 oz (96.3 kg)  08/17/13 214 lb 8.1 oz (97.3 kg)  03/02/13 238 lb 1.6 oz (108.001 kg)  01/24/13 230 lb (104.327 kg)  01/24/13 230 lb (104.327 kg)    Usual Body Weight: 230 lb  % Usual Body Weight: 102%  BMI:  Body mass index is 37.95 kg/(m^2).  Estimated Nutritional Needs: Kcal: 1850-1950 Protein: 90-100g Fluid: 1.9L/day  Skin: Stage II pressure ulcer, open wound on right, left leg and left knee  Diet Order: Diet Heart  EDUCATION NEEDS: -Education needs addressed   Intake/Output Summary (Last 24 hours) at 02/10/14 1318 Last data filed at 02/10/14 1000  Gross per 24 hour  Intake    363 ml  Output    400 ml  Net    -37 ml    Last BM: PTA  Labs:   Recent Labs Lab 02/08/14 1610 02/09/14 0440  NA 139 140  K 5.0 3.8  CL 103 103  CO2 21 24  BUN 36* 30*  CREATININE 1.04 1.00  CALCIUM 9.3 8.8  GLUCOSE 87 116*    CBG (last 3)  No results for input(s): GLUCAP in the last 72 hours.  Scheduled Meds: . anagrelide  0.5 mg Oral BID  . aspirin  325 mg Oral Daily  . atorvastatin  40 mg Oral Daily  . carvedilol  3.125 mg Oral BID WC  . chlorhexidine  15 mL  Mouth/Throat Daily  . clopidogrel  75 mg Oral q1800  . colchicine  0.6 mg Oral q1800  . docusate sodium  100 mg Oral BID  . feeding supplement (ENSURE COMPLETE)  237 mL Oral BID BM  . [START ON 02/11/2014] furosemide  80 mg Oral Daily  . lactobacillus acidophilus  2 tablet Oral BID  . lisinopril  5 mg Oral Daily  . multivitamin with minerals  1 tablet Oral Daily  . piperacillin-tazobactam (ZOSYN)  IV  3.375 g Intravenous 3 times per day  . pregabalin  50 mg Oral TID  . ramelteon  8 mg Oral QHS  . saccharomyces boulardii  250 mg Oral BID  . senna-docusate  1 tablet Oral QHS  . sodium chloride  3 mL Intravenous Q12H  . spironolactone  25 mg Oral Daily  .  thiamine  100 mg Oral Daily  . vancomycin  1,000 mg Intravenous Q12H    Continuous Infusions:   Past Medical History  Diagnosis Date  . Chronic low back pain   . 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 Chronic neck pain   . OSA (obstructive sleep apnea)     CPAP @ bedtime  . Allergy   . Unspecified essential hypertension     Resistant, 2D Echo - [id number]  . Polycythemia   . CAD (coronary artery disease)     Vessel type unspecified  . PVD (peripheral vascular disease)   . DDD (degenerative disc disease), cervical     Past Surgical History  Procedure Laterality Date  . Lower extrmity doppler      [place] 2008; no evidence for PAD  . US echocardiography  11/2008    Normal valves, mild LAE  . Left knee replacement  10/2006  . Cervical spine surgery  02/2008  . Joint replacement    . Spine surgery    . Amputation Right 12/03/2013    Procedure: AMPUTATION RIGHT FOURTH TOE;  Surgeon: Conrad Wolf Point, MD;  Location: [place];  Service: Vascular;  Laterality: Right;    Clayton Bibles, MS, RD, LDN Pager: [phone] After Hours Pager: [phone]

## 2014-02-11 ENCOUNTER — Inpatient Hospital Stay (HOSPITAL_COMMUNITY): Admission: RE | Admit: 2014-02-11 | Payer: Medicare Other | Source: Ambulatory Visit

## 2014-02-11 ENCOUNTER — Inpatient Hospital Stay (HOSPITAL_COMMUNITY): Payer: Medicare Other

## 2014-02-11 ENCOUNTER — Ambulatory Visit: Payer: Medicare Other | Admitting: Family

## 2014-02-11 DIAGNOSIS — L03119 Cellulitis of unspecified part of limb: Secondary | ICD-10-CM

## 2014-02-11 LAB — BASIC METABOLIC PANEL
ANION GAP: 16 — AB (ref 5–15)
BUN: 42 mg/dL — ABNORMAL HIGH (ref 6–23)
CALCIUM: 8.8 mg/dL (ref 8.4–10.5)
CO2: 21 mEq/L (ref 19–32)
CREATININE: 2.03 mg/dL — AB (ref 0.50–1.35)
Chloride: 99 mEq/L (ref 96–112)
GFR, EST AFRICAN AMERICAN: 38 mL/min — AB (ref >90)
GFR, EST NON AFRICAN AMERICAN: 33 mL/min — AB (ref >90)
Glucose, Bld: 97 mg/dL (ref 70–99)
Potassium: 4.4 mEq/L (ref 3.7–5.3)
SODIUM: 136 meq/L — AB (ref 137–147)

## 2014-02-11 LAB — CBC
HCT: 39.8 % (ref 39.0–52.0)
Hemoglobin: 11.9 g/dL — ABNORMAL LOW (ref 13.0–17.0)
MCH: 25.7 pg — AB (ref 26.0–34.0)
MCHC: 29.9 g/dL — AB (ref 30.0–36.0)
MCV: 86 fL (ref 78.0–100.0)
PLATELETS: 512 10*3/uL — AB (ref 150–400)
RBC: 4.63 MIL/uL (ref 4.22–5.81)
RDW: 17.4 % — AB (ref 11.5–15.5)
WBC: 17.2 10*3/uL — ABNORMAL HIGH (ref 4.0–10.5)

## 2014-02-11 MED ORDER — VANCOMYCIN HCL 10 G IV SOLR
1500.0000 mg | INTRAVENOUS | Status: DC
  Administered 2014-02-12 – 2014-02-15 (×4): 1500 mg via INTRAVENOUS
  Filled 2014-02-11 (×4): qty 1500

## 2014-02-11 MED ORDER — FUROSEMIDE 40 MG PO TABS
40.0000 mg | ORAL_TABLET | Freq: Every day | ORAL | Status: DC
  Administered 2014-02-12: 40 mg via ORAL
  Filled 2014-02-11: qty 1

## 2014-02-11 MED ORDER — MUPIROCIN 2 % EX OINT
1.0000 "application " | TOPICAL_OINTMENT | Freq: Two times a day (BID) | CUTANEOUS | Status: DC
  Administered 2014-02-11 – 2014-02-15 (×9): 1 via NASAL
  Filled 2014-02-11: qty 22

## 2014-02-11 MED ORDER — CHLORHEXIDINE GLUCONATE CLOTH 2 % EX PADS
6.0000 | MEDICATED_PAD | Freq: Every day | CUTANEOUS | Status: AC
  Administered 2014-02-11 – 2014-02-15 (×5): 6 via TOPICAL

## 2014-02-11 NOTE — Progress Notes (Signed)
Patient ID: Fernando Lane, male   DOB: April 12, 1950, 63 y.o.   MRN: [id number] [facility] PROGRESS NOTE  Fernando Lane [id number]:[id number] DOB: 1951/02/21 DOA: 02/08/2014 PCP: Gwendolyn Grant, MD  Brief narrative:    63 y.o. 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 difficile colitis. Patient was referred to [facility] ED for evaluation of groin rash concerning for an infection. Patient also noted increasing bleeding from his right toe. On physical exam, patient was noted to have penile and scrotal erythema and in regards to right great toe there was no bleeding but it was swollen.Pt started on broad spectrum abx, vanco and zosyn for treatment of groin and LE infection.   Assessment/Plan:    Principal Problem:  Cellulitis of groin / scrotal cellulitis  - continue broad spectrum antibiotics, vanco and zosyn - WBC count only slightly down since admission; pt remains afebrile  - appreciate wound care assessment - pending  Active Problems: LE cellulitis  - broad spectrum abx will cover for LE cellulitis  - need MRI of the foot which will be done today   Obstructive sleep apnea - on CPAP  Essential hypertension - continue coreg 3.125 mg PO BID. Stop lisinopril and further reduce lasix dose to 40 mg daily since SBP in 90's - normal renal function   Chronic systolic heart failure / Cardiomyopathy, ischemic - continue coreg, spironolactone; lasix dosage down to 40 mg daily due to hypotension   H/O PVD - continue lyrica, aspirin   Polycythemia - continue anagrelide 0.5 mg PO BID  Dyslipidemia - continue statin therapy   Morbid obesity - nutrition consulted   H/O C.diff - on florastor and lactobacillus   DVT Prophylaxis  - SCD's bilaterally for DVT prophylaxis   Code Status: DNR/DNI Family Communication: plan  of care discussed with the patient Disposition Plan: Home when stable.    IV access:   Peripheral IV  Procedures and diagnostic studies:    Dg Toe Great Right 02/08/2014 No acute bone abnormality to the right great toe.  Medical Consultants:   GU Other Consultants:   Nutrition  IAnti-Infectives:    Vanco 02/08/2014 -->  Zosyn 02/08/2014 -->   Leisa Lenz, MD  [facility] Pager [phone]  If 7PM-7AM, please contact night-coverage [email or web address] Password [email or web address] 02/11/2014, 2:49 PM   LOS: 3 days    HPI/Subjective: No acute overnight events.  Objective: Filed Vitals:   02/10/14 1959 02/10/14 2145 02/11/14 0431 02/11/14 1306  BP: 76/46  87/58 89/52  Pulse: 80 83 87 83  Temp: 98.1 F (36.7 C)  98.7 F (37.1 C) 97.9 F (36.6 C)  TempSrc: Oral  Oral Oral  Resp: 16 18 15 16   Height:      Weight:   108.3 kg (238 lb 12.1 oz)   SpO2: 98% 94% 93% 96%    Intake/Output Summary (Last 24 hours) at 02/11/14 1449 Last data filed at 02/11/14 1300  Gross per 24 hour  Intake    240 ml  Output    200 ml  Net     40 ml    Exam:   General:  Pt is alert, follows commands appropriately, not in acute distress  Cardiovascular: Regular rate and rhythm, S1/S2 appreciated   Respiratory: Clear to auscultation bilaterally, no wheezing, no crackles, no rhonchi  Abdomen: Soft, non tender, non distended, bowel sounds present  Extremities: LE with skin changes, erythema and dressing applied over it; scrotal edema and erythema  Neuro: Grossly nonfocal  Data Reviewed: Basic Metabolic Panel:  Recent Labs Lab 02/08/14 1610 02/09/14 0440 02/11/14 0500  NA 139 140 136*  K 5.0 3.8 4.4  CL 103 103 99  CO2 21 24 21   GLUCOSE 87 116* 97  BUN 36* 30* 42*  CREATININE 1.04 1.00 2.03*  CALCIUM 9.3 8.8 8.8   Liver Function Tests:  Recent Labs Lab 02/09/14 0440  AST 12  ALT 8  ALKPHOS 144*  BILITOT 1.0  PROT 6.1  ALBUMIN 2.9*   No results for input(s):  LIPASE, AMYLASE in the last 168 hours. No results for input(s): AMMONIA in the last 168 hours. CBC:  Recent Labs Lab 02/08/14 1610 02/09/14 0440 02/11/14 0500  WBC 19.3* 17.4* 17.2*  NEUTROABS 16.2* 15.1*  --   HGB 13.1 11.8* 11.9*  HCT 42.8 39.7 39.8  MCV 87.5 86.9 86.0  PLT 594* 561* 512*   Cardiac Enzymes: No results for input(s): CKTOTAL, CKMB, CKMBINDEX, TROPONINI in the last 168 hours. BNP: Invalid input(s): POCBNP CBG: No results for input(s): GLUCAP in the last 168 hours.  Recent Results (from the past 240 hour(s))  MRSA PCR Screening     Status: Abnormal   Collection Time: 02/08/14 11:23 PM  Result Value Ref Range Status   MRSA by PCR POSITIVE (A) NEGATIVE Final     Scheduled Meds: . anagrelide  0.5 mg Oral BID  . aspirin  325 mg Oral Daily  . atorvastatin  40 mg Oral Daily  . carvedilol  3.125 mg Oral BID WC  . clopidogrel  75 mg Oral q1800  . colchicine  0.6 mg Oral q1800  . docusate sodium  100 mg Oral BID  . feeding supplement (ENSURE COMPLETE)  237 mL Oral BID BM  . [START ON 02/12/2014] furosemide  40 mg Oral Daily  . lactobacillus acidophilus  2 tablet Oral BID  . multivitamin   1 tablet Oral Daily  . mupirocin ointment  1 application Nasal BID  . piperacillin-tazobactam (ZOSYN)  IV  3.375 g Intravenous 3 times per day  . pregabalin  50 mg Oral TID  . ramelteon  8 mg Oral QHS  . saccharomyces boulardii  250 mg Oral BID  . senna-docusate  1 tablet Oral QHS  . spironolactone  25 mg Oral Daily  . thiamine  100 mg Oral Daily  . [START ON 02/12/2014] vancomycin  1,500 mg Intravenous Q24H

## 2014-02-11 NOTE — Consult Note (Addendum)
WOC wound consult note Reason for Consult:Assess and provide suggestions for care to bilateral lateral leg ulcers, R>L, toes and groin areas. Wound type: inflammatory, fungal, infectious Pressure Ulcer POA: No Measurement:Right lateral LE:  Two ulcers in a 7cm x 5cm area, full thickness, depth of each is 0.4cm.  Wound bed is bleeding, pale pink and moist.  Left lateral LE:  Area measures 4cm x 3cm x 0.2cm and is less moist than left LE, pink and free of necrotic tissue.  Between toes:  Right foot, between 4th and 5th digit:  Moist, macerated, linear opening measuring 0.4cm in length.  Left foot, 3rd digit at dorsal surface:  0.4cm round area of eschar.  Bilateral groin areas are moist, erythematous, not indurated, mildly warm.  Seen by Dr. Karsten Ro today and he feels areas are free of abscess. Nursing staff have provided patient with an OTC antifungal powder. Wound bed:AS described above. Drainage (amount, consistency, odor) Lysle Rubens odor from LEs, and odor consistent with poor hygiene/fungal overgrowth from groin areas. Periwound: Surrounding skin on LEs is erythematous, moist.  Surrounding skin in genital area is erythematous, no evidence of satellite lesions. Dressing procedure/placement/frequency: Dr. Simone Curia note appreciated.  I will implement a twice daily skin care regimen to the groin and scrotum that includes cleansing with Coloplast Skin cleanser and pat dry then placement of the antimicrobial textile, InterDry Ag+.  This is to be used without powders or creams in the affected area and will assist to "wick" away moisture as well as donate an antimicrobial property to the affected area.  The LEs will benefit from daily care and application of a silicone non-adherent wound contact layer (Mepitel), topped with dry gauze, ABDs and Kerlix.  The toes will be cleansed twice daily and a dry gauze placed between them to promote airflow and absorb drainage.  Finally, I will provide bilateral heel boots to  correct foot alignment and take the pressure off of the laterally rotated, feet which is contributing to the lateral wound formation. If not medically contraindicated, consideration of a systemic antifunal may be of some help (e.g., Diflucan). If you agree, please order. [place] nursing team will not follow, but will remain available to this patient, the nursing and medical teams.  Please re-consult if needed. Thanks, Maudie Flakes, MSN, RN, [place], [place], [place] [phone])

## 2014-02-11 NOTE — Progress Notes (Signed)
PT Cancellation Note  Patient Details Name: Fernando Lane MRN: [id number] DOB: December 30, 1950   Cancelled Treatment:    Reason Eval/Treat Not Completed: Patient at procedure or test/unavailable (MRI, will check back as schedule permits)   Norah Devin,KATHrine E 02/11/2014, 2:04 PM Carmelia Bake, PT, DPT 02/11/2014 Pager: [phone]

## 2014-02-11 NOTE — Progress Notes (Signed)
ANTIBIOTIC CONSULT NOTE  Pharmacy Consult for vancomycin/zosyn Indication: cellulitis  No Known Allergies  Patient Measurements: Height: 5\' 6"  (167.6 cm) Weight: 238 lb 12.1 oz (108.3 kg) IBW/kg (Calculated) : 63.8 Adjusted Body Weight:   Vital Signs: Temp: 98.7 F (37.1 C) (11/30 0431) Temp Source: Oral (11/30 0431) BP: 87/58 mmHg (11/30 0431) Pulse Rate: 87 (11/30 0431) Intake/Output from previous day: 11/29 0701 - 11/30 0700 In: 483 [P.O.:480; I.V.:3] Out: 450 [Urine:450] Intake/Output from this shift:    Labs:  Recent Labs  02/08/14 1610 02/09/14 0440 02/11/14 0500  WBC 19.3* 17.4* 17.2*  HGB 13.1 11.8* 11.9*  PLT 594* 561* 512*  CREATININE 1.04 1.00 2.03*   Estimated Creatinine Clearance: 42.4 mL/min (by C-G formula based on Cr of 2.03). No results for input(s): VANCOTROUGH, VANCOPEAK, VANCORANDOM, GENTTROUGH, GENTPEAK, GENTRANDOM, TOBRATROUGH, TOBRAPEAK, TOBRARND, AMIKACINPEAK, AMIKACINTROU, AMIKACIN in the last 72 hours.   Microbiology: Recent Results (from the past 720 hour(s))  MRSA PCR Screening     Status: Abnormal   Collection Time: 02/08/14 11:23 PM  Result Value Ref Range Status   MRSA by PCR POSITIVE (A) NEGATIVE Final    Comment:        The GeneXpert MRSA Assay (FDA approved for NASAL specimens only), is one component of a comprehensive MRSA colonization surveillance program. It is not intended to diagnose MRSA infection nor to guide or monitor treatment for MRSA infections. RESULT CALLED TO, READ BACK BY AND VERIFIED WITH: RUSSELL,J RN AT 0139 11.28.15 BY TIBBITTS,K     Medical History: Past Medical History  Diagnosis Date  . Chronic low back pain   . 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 Chronic neck pain   . OSA (obstructive sleep apnea)     CPAP @ bedtime  . Allergy   . Unspecified essential hypertension     Resistant, 2D Echo - [id number]  . Polycythemia   . CAD (coronary  artery disease)     Vessel type unspecified  . PVD (peripheral vascular disease)   . DDD (degenerative disc disease), cervical    Assessment: 61 YOM presents with bilateral leg pain and groin pain.  Recent R big toe laceration but found to have cellulitis of leg as well as groin/penis.  Given vancomycin 2gm IV x 1 and zosyn 3.375gm x 1 in ED.  11/27 >>vancomycin >> 11/27 >> zosyn >>   Tmax: afeb WBCs: elevated Renal: SCr 2.03 (increasing) CrCl 37N  MRSA PCR: positive  NO cultures currently ordered  Goal of Therapy:  Vancomycin trough level 10-15 mcg/ml  Appropriate antibiotic dosing for renal function; eradication of infection   Plan:   Decrease Vancomycin to 1500mg  IV Q24H for worsening renal function  Continue Zosyn 3.375gm IV q8h over 4h infusion  Follow renal function  Kizzie Furnish, PharmD Pager: [phone] 02/11/2014 7:45 AM

## 2014-02-11 NOTE — Progress Notes (Signed)
Urology Progress Note : penile edema 2ndary anasarca. Low body protein. MRSA + .  Lower extremity cellulitis.  No urologic change overnight.   Subjective:     No acute urologic events overnight. Ambulation:   negative Flatus:    positive Bowel movement  positive  Pain: some relief  Objective:  Blood pressure 87/58, pulse 87, temperature 98.7 F (37.1 C), temperature source Oral, resp. rate 15, height 5\' 6"  (1.676 m), weight 108.3 kg (238 lb 12.1 oz), SpO2 93 %.  Physical Exam:  General:  No acute distress, awake  Genitourinary:   Penile edema. Abdomen +++ edema. Thigh edema. No abscess.  Foley:  none    I/O last 3 completed shifts: In: 64 [P.O.:600; I.V.:3] Out: 650 [Urine:650]  Recent Labs     02/09/14  0440  02/11/14  0500  HGB  11.8*  11.9*  WBC  17.4*  17.2*  PLT  561*  512*    Recent Labs     02/09/14  0440  02/11/14  0500  NA  140  136*  K  3.8  4.4  CL  103  99  CO2  24  21  BUN  30*  42*  CREATININE  1.00  2.03*  CALCIUM  8.8  8.8  GFRNONAA  78*  33*  GFRAA  90*  38*     No results for input(s): INR, APTT in the last 72 hours.  Invalid input(s): PT   Invalid input(s): ABG  Assessment/Plan:    Pt reports that he is able to void, so would avoid tubes al song as possible.  Needs semi-Fowler's position, and avoid sitting position as much as possible.  If pt remains NCB status, may  affect considerations for Rx of renal failure.

## 2014-02-12 ENCOUNTER — Other Ambulatory Visit: Payer: Medicare Other

## 2014-02-12 ENCOUNTER — Ambulatory Visit: Payer: Medicare Other | Admitting: Hematology

## 2014-02-12 MED ORDER — FLUCONAZOLE IN SODIUM CHLORIDE 200-0.9 MG/100ML-% IV SOLN
200.0000 mg | INTRAVENOUS | Status: DC
  Administered 2014-02-12 – 2014-02-14 (×3): 200 mg via INTRAVENOUS
  Filled 2014-02-12 (×4): qty 100

## 2014-02-12 MED ORDER — FUROSEMIDE 20 MG PO TABS
20.0000 mg | ORAL_TABLET | Freq: Every day | ORAL | Status: DC
  Filled 2014-02-12: qty 1

## 2014-02-12 NOTE — Evaluation (Signed)
Physical Therapy Evaluation Patient Details Name: Fernando Lane MRN: [id number] DOB: [birth year] Today's Date: 02/12/2014   History of Present Illness  KEVION Lane is a 63 y.o. 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 difficile colitis was referred to the ER by patient's primary care physician as patient was found to have skin rash in the groin area concerning for cellulitis. Patient also was noted to have increasing bleeding from his right toe. . had extended stay in snf rehab. receiving HHPT  Clinical Impression  Patient mobilized better than anticipated, stood and took a few side steps along  Bed. Patient will benefit from PTto address problems listed in chart below. Patient can benefit from an overhead trapeze while in hospital.    Follow Up Recommendations  SNF, 24/7 caregivers    Equipment Recommendations       Recommendations for Other Services       Precautions / Restrictions Precautions Precautions: Fall Precaution Comments: scrotal edema, R fooot pain      Mobility  Bed Mobility Overal bed mobility: Needs Assistance Bed Mobility: Supine to Sit;Sit to Supine     Supine to sit: Mod assist;HOB elevated Sit to supine: Mod assist   General bed mobility comments: support for legs to edge and back onto bed to prevent drag on legs and shear of scrotal area. slide sheet placed under pads to facilitate  sliding  around and back onto bed.   Transfers Overall transfer level: Needs assistance Equipment used: Rolling walker (2 wheeled) Transfers: Sit to/from Stand Sit to Stand: Mod assist         General transfer comment: cues for safety  Ambulation/Gait Ambulation/Gait assistance: Mod assist Ambulation Distance (Feet): 4 Feet Assistive device: Rolling walker (2 wheeled) Gait Pattern/deviations: Decreased step length - right;Decreased step  length - left;Wide base of support     General Gait Details: sidesteps along the bed to head.wide stance due to edema of legs and a=scrotal area  Stairs            Wheelchair Mobility    Modified Rankin (Stroke Patients Only)       Balance Overall balance assessment: Needs assistance Sitting-balance support: Feet supported;Bilateral upper extremity supported Sitting balance-Leahy Scale: Poor Sitting balance - Comments: improved with repositioning   Standing balance support: During functional activity;Bilateral upper extremity supported Standing balance-Leahy Scale: Poor                               Pertinent Vitals/Pain Pain Assessment: 0-10 Pain Score: 7  Pain Location: scrotal, R foot Pain Descriptors / Indicators: Aching;Squeezing Pain Intervention(s): Limited activity within patient's tolerance;Monitored during session;Patient requesting pain meds-RN notified;Repositioned    Home Living Family/patient expects to be discharged to:: Private residence Living Arrangements: Alone Available Help at Discharge: [place] Type of Home: Apartment Home Access: Level entry     Home Layout: One level Home Equipment: Environmental consultant - 2 wheels;Wheelchair - manual;Shower seat      Prior Function Level of Independence: Needs assistance   Gait / Transfers Assistance Needed: was ambulating with HHPT short distances, functioned from [facility] when home alone   Family assisted with groceries, etc.         Hand Dominance        Extremity/Trunk Assessment   Upper Extremity Assessment: Overall  WFL for tasks assessed           Lower Extremity Assessment: RLE deficits/detail;LLE deficits/detail RLE Deficits / Details: tolerated standing and weight bearing LLE Deficits / Details: same as R  Cervical / Trunk Assessment: Normal  Communication      Cognition Arousal/Alertness: Awake/alert   Overall Cognitive Status: Within Functional Limits for tasks  assessed                      General Comments      Exercises        Assessment/Plan    PT Assessment Patient needs continued PT services  PT Diagnosis Difficulty walking   PT Problem List Decreased strength;Decreased activity tolerance;Decreased balance;Decreased mobility;Decreased knowledge of precautions;Decreased safety awareness;Decreased knowledge of use of DME;Pain  PT Treatment Interventions DME instruction;Gait training   PT Goals (Current goals can be found in the Care Plan section) Acute Rehab PT Goals Patient Stated Goal: to go home PT Goal Formulation: With patient Time For Goal Achievement: 02/26/14 Potential to Achieve Goals: Good    Frequency Min 3X/week   Barriers to discharge Decreased caregiver support      Co-evaluation               End of Session   Activity Tolerance: Patient tolerated treatment well Patient left: in bed;with call bell/phone within reach;with bed alarm set Nurse Communication: Mobility status         Time: 1425-1450 PT Time Calculation (min) (ACUTE ONLY): 25 min   Charges:   PT Evaluation $Initial PT Evaluation Tier I: 1 Procedure PT Treatments $Therapeutic Activity: 23-37 mins   PT G Codes:          Claretha Cooper 02/12/2014, 3:37 PM Tresa Endo PT [phone]

## 2014-02-12 NOTE — Progress Notes (Addendum)
Clinical Social Work Department CLINICAL SOCIAL WORK PLACEMENT NOTE 02/12/2014  Patient:  ARA, MANO  Account Number:  [id number] Admit date:  02/08/2014  Clinical Social Worker:  Maryln Manuel  Date/time:  02/12/2014 02:40 PM  Clinical Social Work is seeking post-discharge placement for this patient at the following level of care:   SKILLED NURSING   (*CSW will update this form in Epic as items are completed)   02/12/2014  Patient/family provided with [facility] Department of Clinical Social Work's list of facilities offering this level of care within the geographic area requested by the patient (or if unable, by the patient's family).  02/12/2014  Patient/family informed of their freedom to choose among providers that offer the needed level of care, that participate in Medicare, Medicaid or managed care program needed by the patient, have an available bed and are willing to accept the patient.  02/12/2014  Patient/family informed of [facility]' ownership interest in [facility], as well as of the fact that they are under no obligation to receive care at this facility.  PASARR submitted to EDS on 02/12/2014 PASARR number received on 02/12/2014  FL2 transmitted to all facilities in geographic area requested by pt/family on  02/12/2014 FL2 transmitted to all facilities within larger geographic area on   Patient informed that his/her managed care company has contracts with or will negotiate with  certain facilities, including the following:     Patient/family informed of bed offers received:  02/14/2014 Patient chooses bed at [facility] [place] recommends and patient chooses bed at    Patient to be transferred to  on  [facility] and [place] on 02/15/2014 Patient to be transferred to facility by ambulance Corey Harold) Patient and family notified of transfer on 02/15/2014 Name of family member notified:  Pt son, Heath Lark notified via  telephone  The following physician request were entered in Epic:   Additional Comments:   Alison Murray, MSW, [place] [facility] [phone]

## 2014-02-12 NOTE — Progress Notes (Signed)
Patient ID: Fernando Lane, male   DOB: 05/02/1950, 63 y.o.   MRN: [id number] [facility] PROGRESS NOTE  MYKAL BATIZ EFE:[id number] DOB: 10/07/1950 DOA: 02/08/2014 PCP: Gwendolyn Grant, MD  Brief narrative:    63 y.o. 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 difficile colitis. Patient was referred to [facility] ED for evaluation of groin rash concerning for an infection. Patient also noted increasing bleeding from his right toe. On physical exam, patient was noted to have penile and scrotal erythema and in regards to right great toe there was no bleeding but it was swollen.Pt started on broad spectrum abx, vanco and zosyn for treatment of groin and LE infection.   Assessment/Plan:     Principal Problem: cellulitis and probable fungal infection involving groin and scrotal area without the abscess / lower extremity cellulitis and probable fugal infection  - Appreciate wound care assessment and recommendations. Right lateral LE: Two ulcers, about 7cm x 5cm area, full thickness, depth of each is 0.4 cm. Wound bed is bleeding, pale pink and moist. Left lateral LE: 4cm x 3cm x 0.2cm pink and free of necrotic tissue. Right foot, between 4th and 5th digit: Moist, macerated, linear opening 0.4cm in length. Left foot, 3rd digit at dorsal surface: 0.4cm round area of eschar. Bilateral groin areas are moist, erythematous.  - Dressing recommendations:  twice daily skin care regimen to the groin and scrotum that includes cleansing with Coloplast Skin cleanser, pat dry and place antimicrobial textile, InterDry Ag+ which is to be used without powders or creams in the affected area. The LEs will benefit from daily care and application of a silicone non-adherent wound contact layer (Mepitel), topped with dry gauze, ABDs and Kerlix. The toes:  need to be cleansed twice daily and a dry gauze placed between them to promote airflow and absorb drainage. In addition, recommendation is for bilateral heel boots to correct foot alignment and take the pressure off of the laterally rotated, feet which is contributing to the lateral wound formation.  - We will continue broad-spectrum antibiotics vancomycin and Zosyn. Added fluconazole 200 mg once a day. Please note that per wound care assessment there is a concern for possible fungal infection and addition of systemic antifungal may be beneficial. - Patient has been seen by urology and recommendation is to continue current treatment. Topical antifungal has been added to the treatment regimen.  - Continue to monitor white blood cell count. White blood cell count is 17.2 on 02/11/2014.  Active Problems:  Obstructive sleep apnea - on CPAP   Essential hypertension - continue coreg 3.125 mg PO BID and lasix 20 gm daily. Please note that patient's blood pressure medication regimen includes higher dose of Lasix 80 mg twice daily and lisinopril but we have to stop lisinopril and reduce the dosage of Lasix because of low blood pressure. With Coreg and Lasix alone blood pressure is 95/54. We have reduced lasix today from 40 mg to 20 mg daily. - normal renal function   Chronic systolic heart failure / Cardiomyopathy, ischemic - continue coreg, spironolactone; lasix dosage down to 20 mg daily due to hypotension   H/O PVD - continue lyrica, aspirin   Polycythemia - continue anagrelide 0.5 mg PO BID  Dyslipidemia - continue statin therapy   Morbid obesity - nutrition consulted   H/O C.diff - on florastor and lactobacillus  DVT Prophylaxis  - SCD's bilaterally for DVT prophylaxis   Code Status: DNR/DNI Family Communication: plan of care discussed with the patient Disposition Plan: Home when stable.    IV access:   Peripheral IV  Procedures and diagnostic studies:    Dg Toe  Great Right 02/08/2014 No acute bone abnormality to the right great toe.  Mr Foot Right Wo Contrast 02/11/2014  1. Subcutaneous and muscular edema signal favoring cellulitis and plantar myositis. No osteomyelitis observed. 2. Prior amputation of the fourth toe at the MTP joint.   Electronically Signed   By: Sherryl Barters M.D.   On: 02/11/2014 14:50   Medical Consultants:   Urology (Dr. Binnie Kand)   Other Consultants:   Physical therapy   Nutrition   WOC  IAnti-Infectives:    Vanco 02/08/2014 -->  Zosyn 02/08/2014 -->   Leisa Lenz, MD  [facility] Pager [phone]  If 7PM-7AM, please contact night-coverage [email or web address] Password [facility] 02/12/2014, 12:27 PM   LOS: 4 days    HPI/Subjective: No acute overnight events.  Objective: Filed Vitals:   02/11/14 1306 02/11/14 2019 02/11/14 2300 02/12/14 0505  BP: 89/52 92/51  95/54  Pulse: 83 78  79  Temp: 97.9 F (36.6 C) 97.7 F (36.5 C)  97.5 F (36.4 C)  TempSrc: Oral Oral  Oral  Resp: 16 18 18 18   Height:      Weight:    108.546 kg (239 lb 4.8 oz)  SpO2: 96% 97%  98%    Intake/Output Summary (Last 24 hours) at 02/12/14 1227 Last data filed at 02/12/14 0544  Gross per 24 hour  Intake    960 ml  Output     75 ml  Net    885 ml    Exam:   General:  Pt is alert, follows commands appropriately, not in acute distress  Cardiovascular: Regular rate and rhythm, S1/S2 appreciated   Respiratory: Clear to auscultation bilaterally, no wheezing, no crackles, no rhonchi  Abdomen: Soft, non tender, non distended, bowel sounds present  GU: scrotal erythema and swelling   Extremities: LE edema with superimposed chronic skin changes, dressing over LE  bilaterally, pulses DP and PT palpable bilaterally  Neuro: Grossly nonfocal  Data Reviewed: Basic Metabolic Panel:  Recent Labs Lab 02/08/14 1610 02/09/14 0440 02/11/14 0500  NA 139 140 136*  K 5.0 3.8 4.4  CL 103 103 99  CO2 21 24 21    GLUCOSE 87 116* 97  BUN 36* 30* 42*  CREATININE 1.04 1.00 2.03*  CALCIUM 9.3 8.8 8.8   Liver Function Tests:  Recent Labs Lab 02/09/14 0440  AST 12  ALT 8  ALKPHOS 144*  BILITOT 1.0  PROT 6.1  ALBUMIN 2.9*   No results for input(s): LIPASE, AMYLASE in the last 168 hours. No results for input(s): AMMONIA in the last 168 hours. CBC:  Recent Labs Lab 02/08/14 1610 02/09/14 0440 02/11/14 0500  WBC 19.3* 17.4* 17.2*  NEUTROABS 16.2* 15.1*  --   HGB 13.1 11.8* 11.9*  HCT 42.8 39.7 39.8  MCV 87.5 86.9 86.0  PLT 594* 561* 512*   Cardiac Enzymes: No results for input(s): CKTOTAL, CKMB, CKMBINDEX, TROPONINI in the last 168 hours. BNP: Invalid input(s): POCBNP CBG: No results for input(s): GLUCAP in the last 168 hours.  Recent Results (from the past 240 hour(s))  MRSA PCR Screening     Status: Abnormal   Collection Time: 02/08/14 11:23 PM  Result Value Ref Range Status   MRSA by PCR  POSITIVE (A) NEGATIVE Final     Scheduled Meds: . anagrelide  0.5 mg Oral BID  . aspirin  325 mg Oral Daily  . atorvastatin  40 mg Oral Daily  . carvedilol  3.125 mg Oral BID WC  . clopidogrel  75 mg Oral q1800  . colchicine  0.6 mg Oral q1800  . docusate sodium  100 mg Oral BID  . feeding supplement (ENSURE COMPLETE)  237 mL Oral BID BM  . furosemide  40 mg Oral Daily  . lactobacillus acidophilus  2 tablet Oral BID  . multivitamin with minerals  1 tablet Oral Daily  . mupirocin ointment  1 application Nasal BID  . piperacillin-tazobactam (ZOSYN)  IV  3.375 g Intravenous 3 times per day  . pregabalin  50 mg Oral TID  . ramelteon  8 mg Oral QHS  . saccharomyces boulardii  250 mg Oral BID  . senna-docusate  1 tablet Oral QHS  . spironolactone  25 mg Oral Daily  . thiamine  100 mg Oral Daily  . vancomycin  1,500 mg Intravenous Q24H

## 2014-02-12 NOTE — Progress Notes (Signed)
Clinical Social Work Department BRIEF PSYCHOSOCIAL ASSESSMENT 02/12/2014  Patient:  Fernando Lane, Fernando Lane     Account Number:  [id number]     Admit date:  02/08/2014  Clinical Social Worker:  Fernando Lane  Date/Time:  02/12/2014 02:27 PM  Referred by:  Physician  Date Referred:  02/12/2014 Referred for  SNF Placement   Other Referral:   Interview type:  Patient Other interview type:   and patient son via telephone    PSYCHOSOCIAL DATA Living Status:  ALONE Admitted from facility:   Level of care:   Primary support name:  Fernando Lane/son/[phone] Primary support relationship to patient:  CHILD, ADULT Degree of support available:   adequate    CURRENT CONCERNS Current Concerns  Post-Acute Placement   Other Concerns:    SOCIAL WORK ASSESSMENT / PLAN CSW received referral for New SNF.    CSW met with pt at bedside. CSW introduced self and explained role. Pt states that he lives alone. Pt shared that he was at [facility] for 6 months and returned home approximately 2 weeks ago. CSW discussed with pt that PT plans to evaluate, but it is likely that recommendation is going to be for SNF for rehab again. Pt expressed that he was hesitant about SNF for rehab and stated that he definitely does not want to return to [facility]. CSW discussed with pt that facilities in [facility] can be explored for other options. Pt agreeable to exploring options. CSW discussed with pt that CSW will have to initiate SNF search in order for facilities to review pt insurance information to determine if pt primary insurance AARP Medicare can be used for SNF coverage.  Pt agreeable to CSW contacting pt son to discuss.    CSW contated pt son, Fernando Lane via telephone. CSW introduced self and explained role. Pt son discussed that pt was at [facility] [place] since [facility] assisted pt in applying for Medicaid and that pt was at facility under his  Medicaid prior to leaving the facility. Pt son discussed that pt has a nurse visit at his home once per week and pt nurse feels that pt needs long term care, but pt has not been agreeable to that. CSW discussed that CSW will explore options for short term rehab and hopefully pt will have an option that he will be agreeable with and that facility can discuss long term care once pt gets settled at facility and started with rehab. Pt son expressed understanding and aware that pt is very hesitant to accept long term care at this time.    CSW completed FL2 and initiated SNF search to [facility].    CSW to follow up with pt and pt son regarding SNF bed offers.    CSW to continue to follow to provide support and assist with pt discharge planning needs.   Assessment/plan status:  Psychosocial Support/Ongoing Assessment of Needs Other assessment/ plan:   discharge planning   Information/referral to community resources:   [facility] search    PATIENT'S/FAMILY'S RESPONSE TO PLAN OF CARE: Pt alert and oriented x 4. Pt hesitant about returning to a SNF, but agreeable to exploring options in [facility]. Pt hopeful that primary insurance will be able to cover SNF as he does not want his disability check taken from him for long term care at [facility].    Fernando Lane, MSW, [place] [facility] [phone]

## 2014-02-12 NOTE — Progress Notes (Signed)
Pt has refused CPAP for the night.  RT to monitor and assess as needed.  

## 2014-02-13 DIAGNOSIS — L03818 Cellulitis of other sites: Secondary | ICD-10-CM

## 2014-02-13 DIAGNOSIS — N179 Acute kidney failure, unspecified: Secondary | ICD-10-CM

## 2014-02-13 LAB — CBC
HCT: 39.6 % (ref 39.0–52.0)
HEMOGLOBIN: 11.9 g/dL — AB (ref 13.0–17.0)
MCH: 25.7 pg — ABNORMAL LOW (ref 26.0–34.0)
MCHC: 30.1 g/dL (ref 30.0–36.0)
MCV: 85.5 fL (ref 78.0–100.0)
Platelets: 482 10*3/uL — ABNORMAL HIGH (ref 150–400)
RBC: 4.63 MIL/uL (ref 4.22–5.81)
RDW: 17.6 % — ABNORMAL HIGH (ref 11.5–15.5)
WBC: 16.7 10*3/uL — AB (ref 4.0–10.5)

## 2014-02-13 LAB — BASIC METABOLIC PANEL
Anion gap: 15 (ref 5–15)
BUN: 51 mg/dL — ABNORMAL HIGH (ref 6–23)
CHLORIDE: 103 meq/L (ref 96–112)
CO2: 21 mEq/L (ref 19–32)
Calcium: 9.1 mg/dL (ref 8.4–10.5)
Creatinine, Ser: 2.02 mg/dL — ABNORMAL HIGH (ref 0.50–1.35)
GFR, EST AFRICAN AMERICAN: 38 mL/min — AB (ref >90)
GFR, EST NON AFRICAN AMERICAN: 33 mL/min — AB (ref >90)
Glucose, Bld: 96 mg/dL (ref 70–99)
POTASSIUM: 4.5 meq/L (ref 3.7–5.3)
SODIUM: 139 meq/L (ref 137–147)

## 2014-02-13 LAB — CLOSTRIDIUM DIFFICILE BY PCR: CDIFFPCR: NEGATIVE

## 2014-02-13 NOTE — Progress Notes (Signed)
ANTIBIOTIC CONSULT NOTE  Pharmacy Consult for vancomycin/zosyn Indication: cellulitis  No Known Allergies  Patient Measurements: Height: 5\' 6"  (167.6 cm) Weight: 239 lb 4.8 oz (108.546 kg) IBW/kg (Calculated) : 63.8  Assessment: 63 YOM presents 11/27 with bilateral leg pain and groin pain. Recent R big toe laceration but found to have cellulitis of leg as well as groin/penis. 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 difficile colitis. Pharmacy is consulted to dose vancomycin and Zosyn for cellulitis.  Imaging 11/30 MRI R foot w/o contrast shows SQ and muscular edema favoring cellulitis and plantar myositis. No osteomyelitis observed  Antiinfectives 11/27 >> vancomycin >> 11/27 >> zosyn >>  12/1 >> fluconazole (MD) >>  Labs / vitals Tmax: remains afebrile WBCs: remains elevated Renal: SCr 2.02- stable (baseline 0.9), CrCl 42 ml/min CG, CrCl 37N  Microbiology MRSA PCR: positive  Drug level / dose changes info: 11/30: SCr jumped, decrease dose to Vanc 1500mg  IV Q24H from 1g q12  12/2: D6 vancomycin 1500mg  IV q24h and ZEI for cellulitis of BL LE/groin/penis. D2 fluconazole (MD) added per WOC recommendations.  Goal of Therapy:  Vancomycin trough level 10-15 mcg/ml  Appropriate antibiotic dosing for renal function; eradication of infection   Plan:  - continue vancomycin 1500mg  IV q24h - continue Zosyn 3.375G IV q8h, each dose to be infused over 4 hours - vancomycin trough at steady state if indicated - follow-up clinical course, culture results, renal function - follow-up antibiotic de-escalation and length of therapy  Thank you for the consult.  Currie Paris, PharmD, BCPS Pager: [phone] Pharmacy: [phone] 02/13/2014 8:47 AM

## 2014-02-13 NOTE — Progress Notes (Signed)
Physical Therapy Treatment Patient Details Name: Fernando Lane MRN: [id number] DOB: 02-Aug-1950 Today's Date: 02/13/2014    History of Present Illness Fernando Lane is a 63 y.o. 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 difficile colitis was referred to the ER by patient's primary care physician as patient was found to have skin rash in the groin area concerning for cellulitis. Patient also was noted to have increasing bleeding from his right toe. . had extended stay in snf rehab. receiving HHPT    PT Comments    Pt was able to tolerate WC mobility x2 and transfers x2 this tx.  Pt requires min-mod A with all mobility; he is limited by weakness and fatigues easily.  Pt requires +2 A for transfers for safety; he remains unsteady and unsafe.    Follow Up Recommendations  SNF     Equipment Recommendations  None recommended by PT    Recommendations for Other Services       Precautions / Restrictions Precautions Precautions: Fall Precaution Comments: scrotal edema, R fooot pain Restrictions Weight Bearing Restrictions: No    Mobility  Bed Mobility Overal bed mobility: Needs Assistance Bed Mobility: Supine to Sit     Supine to sit: Mod assist;HOB elevated     General bed mobility comments: HOB significantly elevated; heavy use of siderails and handheld manual A from PTA; requires A for forward scoot using bed pad  Transfers Overall transfer level: Needs assistance Equipment used: Rolling walker (2 wheeled) Transfers: Sit to/from [employer] Sit to Stand: Mod assist;+2 safety/equipment Stand pivot transfers: Mod assist;+2 safety/equipment       General transfer comment: +2 for safety; poor control of LE MVT; requires significantly increased time; pt unsteady during transfer  Ambulation/Gait                 Hotel manager mobility: Yes Wheelchair propulsion: Both upper extremities Wheelchair parts: Needs assistance Distance: 100 (x2) Wheelchair Assistance Details (indicate cue type and reason): pt requires 20% A with propulsion; pt fatigues easily; limited ability to control parts and steering due to UE weakness  Modified Rankin (Stroke Patients Only)       Balance                                    Cognition Arousal/Alertness: Awake/alert Behavior During Therapy: WFL for tasks assessed/performed Overall Cognitive Status: Within Functional Limits for tasks assessed                      Exercises      General Comments        Pertinent Vitals/Pain Pain Assessment: 0-10 Pain Score: 8  Pain Location: R foot, ankle Pain Intervention(s): Monitored during session;RN gave pain meds during session;Repositioned    Home Living                      Prior Function            PT Goals (current goals can now be found in the care plan section) Progress towards PT goals: Progressing toward goals    Frequency  Min 3X/week    PT Plan Current plan remains appropriate  Co-evaluation             End of Session Equipment Utilized During Treatment: Gait belt (WC) Activity Tolerance: Patient tolerated treatment well Patient left: in chair;with call bell/phone within reach;with family/visitor present     Time: 2241-1464 PT Time Calculation (min) (ACUTE ONLY): 49 min  Charges:  $Therapeutic Activity: 23-37 mins $Wheel Chair Management: 8-22 mins                    G Codes:     Rica Koyanagi  PTA [facility]  Acute  Rehab Pager      [phone]

## 2014-02-13 NOTE — Plan of Care (Signed)
Problem: Phase I Progression Outcomes Goal: Initial discharge plan identified Outcome: Progressing Goal: Hemodynamically stable Outcome: Progressing

## 2014-02-13 NOTE — Progress Notes (Signed)
Progress Note   Fernando Lane:[id number] DOB: Sep 13, 1950 DOA: 02/08/2014 PCP: Gwendolyn Grant, MD   Brief Narrative:   Fernando Lane is an 63 y.o. 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 difficile colitis, who was admitted on 02/08/14 with groin cellulitis and right great toe bleeding.  On physical exam, patient was noted to have penile and scrotal erythema and a swollen right toe without evidence of active bleeding.  Pt started on broad spectrum abx, vanco and zosyn for treatment of groin and LE infection.   Assessment/Plan:   Principal Problem:   Cellulitis of groin  Seen by wound care RN with recommendations for wound care as noted in her note.  Continue broad-spectrum antibiotics with vancomycin and Zosyn. Fluconazole added 02/12/14 due to concerns for fungal infection.  Urologist following.  Active Problems:   Bilateral leg wounds  Wound care per nursing staff. Status post evaluation by wound care RN. Please see her note for complete documentation of wounds.  Continue bilateral Prevalon boots.  MRI 02/11/14, negative for osteomyelitis.    Obstructive sleep apnea  Continue CPAP at bedtime.    ARF  Stop lasix and spironolactone.  Check creatinine in a.m. Likely from hypotension.    POLYCYTHEMIA  Continue anagrelide 0.5 mg PO BID.    Obstructive sleep apnea  Continue to offer CPAP daily at bedtime. Note, has refused.    Essential hypertension  Continue Coreg and lower dose Lasix. Lisinopril on hold secondary to soft BP.    Coronary atherosclerosis/Peripheral vascular disease  Continue aspirin and Plavix.    Chronic systolic heart failure/Cardiomyopathy, ischemic  Hold diuretics given acute renal failure.  Monitor I and O and daily weights. Weight stable over the past 24 hours. I/O balance +1 L  over the past 24 hours.    MRSA carrier   On decontamination therapy.  DVT Prophylaxis  Code Status: DNR/DNI Family Communication: No family at bedside. Disposition Plan: SNF recommended per PT eval.   IV Access:    Peripheral IV   Procedures and diagnostic studies:   Dg Toe Great Right 02/08/2014 No acute bone abnormality to the right great toe.  Mr Foot Right Wo Contrast 02/11/2014 1. Subcutaneous and muscular edema signal favoring cellulitis and plantar myositis. No osteomyelitis observed. 2. Prior amputation of the fourth toe at the MTP joint.   Medical Consultants:    Dr. Carolan Clines, Urology.  Anti-Infectives:    Vanco 02/08/2014 -->  Zosyn 02/08/2014 -->  Diflucan 02/12/14--->  Subjective:   [name] reports that he is beginning to feel better.  Scrotum and legs are less swollen.  Says he cannot bear weight on the right.  Denies significant diarrhea.  Appetite OK.  No N/V.  Objective:    Filed Vitals:   02/12/14 0505 02/12/14 1300 02/12/14 2050 02/13/14 0552  BP: 95/54 99/44 90/54  110/64  Pulse: 79 80 76 78  Temp: 97.5 F (36.4 C) 98 F (36.7 C) 98.2 F (36.8 C) 98.4 F (36.9 C)  TempSrc: Oral Oral Oral Oral  Resp: 18 16 16 16   Height:      Weight: 108.546 kg (239 lb 4.8 oz)     SpO2: 98% 94% 93% 91%    Intake/Output Summary (Last 24 hours) at 02/13/14 0800 Last data filed at 02/13/14 2637  Gross per 24 hour  Intake  1370 ml  Output    351 ml  Net   1019 ml    Exam: Gen:  NAD Cardiovascular:  RRR, No M/R/G Respiratory:  Lungs CTAB Gastrointestinal:  Abdomen soft, NT/ND, + BS GU: Residual scrotal swelling/erythema Extremities:  2+ edema bilaterally with multiple wounds, dressed   Data Reviewed:    Labs: Basic Metabolic Panel:  Recent Labs Lab 02/08/14 1610 02/09/14 0440 02/11/14 0500 02/13/14 0515  NA 139 140 136* 139  K 5.0 3.8 4.4 4.5  CL 103 103 99 103  CO2 21 24 21 21   GLUCOSE 87 116* 97  96  BUN 36* 30* 42* 51*  CREATININE 1.04 1.00 2.03* 2.02*  CALCIUM 9.3 8.8 8.8 9.1   GFR Estimated Creatinine Clearance: 42.7 mL/min (by C-G formula based on Cr of 2.02). Liver Function Tests:  Recent Labs Lab 02/09/14 0440  AST 12  ALT 8  ALKPHOS 144*  BILITOT 1.0  PROT 6.1  ALBUMIN 2.9*   CBC:  Recent Labs Lab 02/08/14 1610 02/09/14 0440 02/11/14 0500 02/13/14 0515  WBC 19.3* 17.4* 17.2* 16.7*  NEUTROABS 16.2* 15.1*  --   --   HGB 13.1 11.8* 11.9* 11.9*  HCT 42.8 39.7 39.8 39.6  MCV 87.5 86.9 86.0 85.5  PLT 594* 561* 512* 482*   BNP (last 3 results)  Recent Labs  08/07/13 1600 08/23/13 1759 11/15/13 1951  PROBNP 13375.0* 9383.0* 9486.0*   Microbiology Recent Results (from the past 240 hour(s))  MRSA PCR Screening     Status: Abnormal   Collection Time: 02/08/14 11:23 PM  Result Value Ref Range Status   MRSA by PCR POSITIVE (A) NEGATIVE Final    Comment:        The GeneXpert MRSA Assay (FDA approved for NASAL specimens only), is one component of a comprehensive MRSA colonization surveillance program. It is not intended to diagnose MRSA infection nor to guide or monitor treatment for MRSA infections. RESULT CALLED TO, READ BACK BY AND VERIFIED WITH: RUSSELL,J RN AT 0139 11.28.15 BY TIBBITTS,K      Medications:   . anagrelide  0.5 mg Oral BID  . aspirin  325 mg Oral Daily  . atorvastatin  40 mg Oral Daily  . carvedilol  3.125 mg Oral BID WC  . chlorhexidine  15 mL Mouth/Throat Daily  . Chlorhexidine Gluconate Cloth  6 each Topical Q0600  . clopidogrel  75 mg Oral q1800  . colchicine  0.6 mg Oral q1800  . docusate sodium  100 mg Oral BID  . feeding supplement (ENSURE COMPLETE)  237 mL Oral BID BM  . fluconazole (DIFLUCAN) IV  200 mg Intravenous Q24H  . furosemide  20 mg Oral Daily  . lactobacillus acidophilus  2 tablet Oral BID  . multivitamin with minerals  1 tablet Oral Daily  . mupirocin ointment  1 application Nasal BID  .  piperacillin-tazobactam (ZOSYN)  IV  3.375 g Intravenous 3 times per day  . pregabalin  50 mg Oral TID  . ramelteon  8 mg Oral QHS  . saccharomyces boulardii  250 mg Oral BID  . senna-docusate  1 tablet Oral QHS  . sodium chloride  3 mL Intravenous Q12H  . spironolactone  25 mg Oral Daily  . thiamine  100 mg Oral Daily  . vancomycin  1,500 mg Intravenous Q24H   Continuous Infusions:   Time spent: 25 minutes.   LOS: 5 days   [facility] Pager [phone]. If unable to reach me by pager,  please call my cell phone at [phone].  *Please refer to [email or web address], password [email or web address] to get updated schedule on who will round on this patient, as hospitalists switch teams weekly. If 7PM-7AM, please contact night-coverage at [email or web address], password [email or web address] for any overnight needs.  02/13/2014, 8:00 AM

## 2014-02-13 NOTE — Care Management Note (Signed)
CARE MANAGEMENT NOTE 02/13/2014  Patient:  Fernando Lane, Fernando Lane   Account Number:  [id number]  Date Initiated:  02/13/2014  Documentation initiated by:  Marney Doctor  Subjective/Objective Assessment:   63 yo admitted with Cellulitis of the groin. Hx of Chronic low back pain, Myeloproliferative disorder, CAD     Action/Plan:   From home alone. Has RN from [facility]   Anticipated DC Date:  02/15/2014   Anticipated DC Plan:  [place]  In-house referral  Clinical Social Worker      DC Planning Services  CM consult      Choice offered to / List presented to:             Status of service:  In process, will continue to follow Medicare Important Message given?   (If response is "NO", the following Medicare IM given date fields will be blank) Date Medicare IM given:   Medicare IM given by:   Date Additional Medicare IM given:   Additional Medicare IM given by:    Discharge Disposition:    Per UR Regulation:  Reviewed for med. necessity/level of care/duration of stay  If discussed at [facility] of Stay Meetings, dates discussed:    Comments:  02/13/14 Marney Doctor RN,BSN,NCM [phone] Chart reviewed and CM following for DC needs.

## 2014-02-13 NOTE — Plan of Care (Signed)
Problem: Phase I Progression Outcomes Goal: Pain controlled with appropriate interventions Outcome: Progressing Goal: OOB as tolerated unless otherwise ordered Outcome: Not Progressing Goal: Initial discharge plan identified Outcome: Progressing Goal: Voiding-avoid urinary catheter unless indicated Outcome: Progressing Goal: Hemodynamically stable Outcome: Progressing Goal: Temperature < 102 Outcome: Progressing Goal: Wound assessment- dressing change as appropriate Outcome: Progressing

## 2014-02-13 NOTE — Plan of Care (Signed)
Problem: Phase I Progression Outcomes Goal: Pain controlled with appropriate interventions Outcome: Progressing Goal: OOB as tolerated unless otherwise ordered Outcome: Progressing

## 2014-02-13 NOTE — Progress Notes (Signed)
CSW continuing to follow for disposition planning.  CSW followed up with pt at bedside to provide SNF bed offers. CSW provided SNF bed offers to pt and clarified pt questions. Pt requested CSW contact pt son, Heath Lark to discuss. Pt states that he and pt son will discuss options when pt son visits today in order to make decision.  CSW contacted pt son, Heath Lark via telephone. CSW provided pt son bed offers. Pt son asked if [facility] was able to offer a bed and CSW explained that [facility] was considering. CSW contacted [facility] and received notification that facility can make bed offer. CSW notified pt son.   CSW attempted to follow up with pt at bedside to notify pt about discussion with pt son, but pt was sleeping at this time.   CSW to follow up with pt and pt son regarding decision for SNF placement.  Alison Murray, MSW, [employer] [phone]

## 2014-02-14 ENCOUNTER — Other Ambulatory Visit: Payer: Medicare Other

## 2014-02-14 ENCOUNTER — Ambulatory Visit: Payer: Medicare Other | Admitting: Hematology and Oncology

## 2014-02-14 LAB — BASIC METABOLIC PANEL
ANION GAP: 13 (ref 5–15)
BUN: 45 mg/dL — ABNORMAL HIGH (ref 6–23)
CHLORIDE: 102 meq/L (ref 96–112)
CO2: 25 mEq/L (ref 19–32)
Calcium: 9.4 mg/dL (ref 8.4–10.5)
Creatinine, Ser: 1.72 mg/dL — ABNORMAL HIGH (ref 0.50–1.35)
GFR calc Af Amer: 47 mL/min — ABNORMAL LOW (ref >90)
GFR calc non Af Amer: 40 mL/min — ABNORMAL LOW (ref >90)
Glucose, Bld: 94 mg/dL (ref 70–99)
POTASSIUM: 4.7 meq/L (ref 3.7–5.3)
Sodium: 140 mEq/L (ref 137–147)

## 2014-02-14 NOTE — Plan of Care (Signed)
Problem: Phase I Progression Outcomes Goal: OOB as tolerated unless otherwise ordered Outcome: Progressing Goal: Initial discharge plan identified Outcome: Completed/Met Date Met:  02/14/14 Goal: Hemodynamically stable Outcome: Progressing Goal: Other Phase I Outcomes/Goals Outcome: Progressing  Problem: Consults Goal: Skin Care Protocol Initiated - if Braden Score 18 or less If consults are not indicated, leave blank or document N/A  Outcome: Completed/Met Date Met:  02/14/14  Problem: Phase I Progression Outcomes Goal: OOB as tolerated unless otherwise ordered Outcome: Progressing Goal: Initial discharge plan identified Outcome: Completed/Met Date Met:  02/14/14 Goal: Voiding-avoid urinary catheter unless indicated Outcome: Completed/Met Date Met:  02/14/14 Goal: Hemodynamically stable Outcome: Progressing Goal: Other Phase I Outcomes/Goals Outcome: Progressing  Problem: Phase I Progression Outcomes Goal: Pain controlled with appropriate interventions Outcome: Completed/Met Date Met:  02/14/14 Goal: OOB as tolerated unless otherwise ordered Outcome: Progressing Goal: Initial discharge plan identified Outcome: Completed/Met Date Met:  02/14/14 Goal: Voiding-avoid urinary catheter unless indicated Outcome: Completed/Met Date Met:  02/14/14 Goal: Hemodynamically stable Outcome: Progressing Goal: Temperature < 102 Outcome: Completed/Met Date Met:  02/14/14 Goal: Wound assessment- dressing change as appropriate Outcome: Completed/Met Date Met:  02/14/14 Goal: Other Phase I Outcomes/Goals Outcome: Progressing

## 2014-02-14 NOTE — Progress Notes (Signed)
Physical Therapy Treatment Patient Details Name: Fernando Lane MRN: [id number] DOB: 20-Mar-1950 Today's Date: 02/14/2014    History of Present Illness Fernando Lane is a 63 y.o. 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 difficile colitis was referred to the ER by patient's primary care physician as patient was found to have skin rash in the groin area concerning for cellulitis. Patient also was noted to have increasing bleeding from his right toe. . had extended stay in snf rehab. receiving HHPT    PT Comments    Pt was able to tolerate 3 feet of gait training this tx; pt unsteady during gait and requires mod motivation to perform; +2 for safety and equipment.  Pt requires mod A for all mobility this tx.  Pt able to tolerate B LE general TEs but fatigues easily.    Follow Up Recommendations  SNF     Equipment Recommendations  None recommended by PT    Recommendations for Other Services       Precautions / Restrictions Precautions Precautions: Fall Precaution Comments: scrotal edema, R fooot pain Restrictions Weight Bearing Restrictions: No    Mobility  Bed Mobility Overal bed mobility: Needs Assistance Bed Mobility: Supine to Sit     Supine to sit: Mod assist;HOB elevated     General bed mobility comments: HOB significantly elevated; heavy use of siderails and handheld manual A from PTA; requires A for forward scoot using bed pad  Transfers Overall transfer level: Needs assistance Equipment used: Rolling walker (2 wheeled) Transfers: Sit to/from Stand Sit to Stand: Mod assist;+2 safety/equipment         General transfer comment: +2 for safety; poor control of LE MVT; requires significantly increased time; pt unsteady during transfer  Ambulation/Gait Ambulation/Gait assistance: Min assist;+2 safety/equipment Ambulation Distance (Feet): 3  Feet Assistive device: Rolling walker (2 wheeled) Gait Pattern/deviations: Step-through pattern;Shuffle;Trunk flexed;Decreased stride length Gait velocity: decr   General Gait Details: requires mod motivation to perform; fatigues easily; +2 A for following in recliner; pt unsteady, high fall risk   Stairs            Wheelchair Mobility    Modified Rankin (Stroke Patients Only)       Balance                                    Cognition Arousal/Alertness: Awake/alert Behavior During Therapy: WFL for tasks assessed/performed Overall Cognitive Status: Within Functional Limits for tasks assessed                      Exercises General Exercises - Lower Extremity Ankle Circles/Pumps: AROM;Both;10 reps;Supine Quad Sets: AROM;Both;10 reps;Supine Short Arc Quad: AROM;Both;10 reps;Supine Heel Slides: AROM;Both;10 reps;Supine Hip ABduction/ADduction: AROM;Both;10 reps;Supine Straight Leg Raises: AAROM;Both;10 reps;Supine    General Comments        Pertinent Vitals/Pain Pain Assessment: No/denies pain    Home Living                      Prior Function            PT Goals (current goals can now be found in the care plan section) Progress towards PT goals: Progressing toward goals    Frequency  Min 3X/week    PT Plan  Current plan remains appropriate    Co-evaluation             End of Session Equipment Utilized During Treatment: Gait belt Activity Tolerance: Patient tolerated treatment well Patient left: in chair;with call bell/phone within reach;with family/visitor present     Time: 1125-1200 PT Time Calculation (min) (ACUTE ONLY): 35 min  Charges:                       G Codes:      Miller,Derrick, SPTA 02/14/2014, 12:17 PM   Reviewed above  Rica Koyanagi  PTA [facility]  Acute  Rehab Pager      [phone]

## 2014-02-14 NOTE — Progress Notes (Signed)
RT placed patient on CPAP. CPAP setting is auto titrate (min 6 and max 20). Sterile water added to water chamber for humidification. 2 liters oxygen bleed into tubing. Patient is tolerating well. RT will monitor and assess as needed.

## 2014-02-14 NOTE — Progress Notes (Signed)
CSW continuing to follow.  CSW followed up with pt at bedside to discuss decision re: SNF placement.   Pt discussed that he is hopeful to get some guidance from pt son regarding decision. CSW contacted pt son via telephone and placed phone on speaker phone in order for pt to be able to communicate with pt son regarding decision. CSW clarified pt and pt son's questions. Pt chooses bed at [facility].   CSW contacted [facility] and notified facility of pt acceptance of bed offer.   CSW spoke with MD who stated that pt is not yet medically ready for discharge. Potential discharge tomorrow.  CSW to continue to follow to provide support and assist with pt discharge planning needs to [facility] when pt medically stable for discharge.  Alison Murray, MSW, [place] Work [phone]

## 2014-02-14 NOTE — Progress Notes (Signed)
RT placed pt on auto titrate CPAP 6-20cmH2O with 2L of oxygen bled in (to maintain SpO2 >92). Sterile water was added for humidification. Pt states he is comfortable and is tolerating CPAP well at this time. RT will continue to monitor as needed.

## 2014-02-14 NOTE — Progress Notes (Signed)
Progress Note   Fernando Lane:[id number] DOB: [birth year] DOA: 02/08/2014 PCP: Gwendolyn Grant, MD   Brief Narrative:   Fernando Lane is an 63 y.o. 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 difficile colitis, who was admitted on 02/08/14 with groin cellulitis and right great toe bleeding.  On physical exam, patient was noted to have penile and scrotal erythema and a swollen right toe without evidence of active bleeding.  Pt started on broad spectrum abx, vanco and zosyn for treatment of groin and LE infection.   Assessment/Plan:   Principal Problem:   Cellulitis of groin  Seen by wound care RN with recommendations for wound care as noted in her note.  Continue broad-spectrum antibiotics with vancomycin and Zosyn. Fluconazole added 02/12/14 due to concerns for fungal infection.  Urologist following.  Active Problems:   Bilateral leg wounds  Wound care per nursing staff. Status post evaluation by wound care RN. Please see her note for complete documentation of wounds.  Continue bilateral Prevalon boots.  MRI 02/11/14, negative for osteomyelitis.    Obstructive sleep apnea  Continue CPAP at bedtime.    ARF  Continue to hold lasix and spironolactone.  Creatinine better today.    POLYCYTHEMIA  Continue anagrelide 0.5 mg PO BID.    Obstructive sleep apnea  Continue to offer CPAP daily at bedtime. Note, has refused.    Essential hypertension  Continue Coreg. Lisinopril and diuretics on hold.    Coronary atherosclerosis/Peripheral vascular disease  Continue aspirin and Plavix.    Chronic systolic heart failure/Cardiomyopathy, ischemic  Holding diuretics given acute renal failure.  Monitor I and O and daily weights. Weight up 5 pounds over the past 24 hours. I/O balance +1.3 L over the past 24 hours.    MRSA  carrier   On decontamination therapy.  DVT Prophylaxis  Code Status: DNR/DNI Family Communication: No family at bedside. Disposition Plan: SNF recommended per PT eval.   IV Access:    Peripheral IV   Procedures and diagnostic studies:   Dg Toe Great Right 02/08/2014 No acute bone abnormality to the right great toe.  Mr Foot Right Wo Contrast 02/11/2014 1. Subcutaneous and muscular edema signal favoring cellulitis and plantar myositis. No osteomyelitis observed. 2. Prior amputation of the fourth toe at the MTP joint.   Medical Consultants:    Dr. Carolan Clines, Urology.  Anti-Infectives:    Vanco 02/08/2014 -->  Zosyn 02/08/2014 -->  Diflucan 02/12/14--->  Subjective:   [name] doesn't feel well today, with generalized discomfort in his chest and abdomen.    Objective:    Filed Vitals:   02/13/14 1700 02/13/14 2145 02/14/14 0014 02/14/14 0545  BP: 90/59 104/74  109/70  Pulse: 77 71 82 76  Temp: 98.2 F (36.8 C) 97.6 F (36.4 C)  97.8 F (36.6 C)  TempSrc: Oral Axillary  Oral  Resp: 16 20 18 20   Height:      Weight:    110.9 kg (244 lb 7.8 oz)  SpO2: 95% 98% 94% 97%    Intake/Output Summary (Last 24 hours) at 02/14/14 0749 Last data filed at 02/14/14 0711  Gross per 24 hour  Intake   1610 ml  Output    275 ml  Net   1335 ml    Exam: Gen:  NAD Cardiovascular:  RRR, No M/R/G  Respiratory:  Lungs CTAB Gastrointestinal:  Abdomen soft, NT/ND, + BS GU: Residual scrotal swelling/erythema Extremities:  2+ edema bilaterally with multiple wounds, dressed   Data Reviewed:    Labs: Basic Metabolic Panel:  Recent Labs Lab 02/08/14 1610 02/09/14 0440 02/11/14 0500 02/13/14 0515 02/14/14 0525  NA 139 140 136* 139 140  K 5.0 3.8 4.4 4.5 4.7  CL 103 103 99 103 102  CO2 21 24 21 21 25   GLUCOSE 87 116* 97 96 94  BUN 36* 30* 42* 51* 45*  CREATININE 1.04 1.00 2.03* 2.02* 1.72*  CALCIUM 9.3 8.8 8.8 9.1 9.4   GFR Estimated  Creatinine Clearance: 50.7 mL/min (by C-G formula based on Cr of 1.72). Liver Function Tests:  Recent Labs Lab 02/09/14 0440  AST 12  ALT 8  ALKPHOS 144*  BILITOT 1.0  PROT 6.1  ALBUMIN 2.9*   CBC:  Recent Labs Lab 02/08/14 1610 02/09/14 0440 02/11/14 0500 02/13/14 0515  WBC 19.3* 17.4* 17.2* 16.7*  NEUTROABS 16.2* 15.1*  --   --   HGB 13.1 11.8* 11.9* 11.9*  HCT 42.8 39.7 39.8 39.6  MCV 87.5 86.9 86.0 85.5  PLT 594* 561* 512* 482*   BNP (last 3 results)  Recent Labs  08/07/13 1600 08/23/13 1759 11/15/13 1951  PROBNP 13375.0* 9383.0* 9486.0*   Microbiology Recent Results (from the past 240 hour(s))  MRSA PCR Screening     Status: Abnormal   Collection Time: 02/08/14 11:23 PM  Result Value Ref Range Status   MRSA by PCR POSITIVE (A) NEGATIVE Final    Comment:        The GeneXpert MRSA Assay (FDA approved for NASAL specimens only), is one component of a comprehensive MRSA colonization surveillance program. It is not intended to diagnose MRSA infection nor to guide or monitor treatment for MRSA infections. RESULT CALLED TO, READ BACK BY AND VERIFIED WITH: RUSSELL,J RN AT 0139 11.28.15 BY TIBBITTS,K   Clostridium Difficile by PCR     Status: None   Collection Time: 02/13/14  8:46 AM  Result Value Ref Range Status   C difficile by pcr NEGATIVE NEGATIVE Final    Comment: Performed at [facility]     Medications:   . anagrelide  0.5 mg Oral BID  . aspirin  325 mg Oral Daily  . atorvastatin  40 mg Oral Daily  . carvedilol  3.125 mg Oral BID WC  . chlorhexidine  15 mL Mouth/Throat Daily  . Chlorhexidine Gluconate Cloth  6 each Topical Q0600  . clopidogrel  75 mg Oral q1800  . colchicine  0.6 mg Oral q1800  . docusate sodium  100 mg Oral BID  . feeding supplement (ENSURE COMPLETE)  237 mL Oral BID BM  . fluconazole (DIFLUCAN) IV  200 mg Intravenous Q24H  . lactobacillus acidophilus  2 tablet Oral BID  . multivitamin with minerals  1 tablet  Oral Daily  . mupirocin ointment  1 application Nasal BID  . piperacillin-tazobactam (ZOSYN)  IV  3.375 g Intravenous 3 times per day  . pregabalin  50 mg Oral TID  . ramelteon  8 mg Oral QHS  . saccharomyces boulardii  250 mg Oral BID  . senna-docusate  1 tablet Oral QHS  . sodium chloride  3 mL Intravenous Q12H  . thiamine  100 mg Oral Daily  . vancomycin  1,500 mg Intravenous Q24H   Continuous Infusions:   Time spent: 25 minutes.   LOS: 6 days   Shylyn Younce  [facility] Pager [phone]. If unable to reach me by pager, please call my cell phone at [phone].  *Please refer to [email or web address], password [email or web address] to get updated schedule on who will round on this patient, as hospitalists switch teams weekly. If 7PM-7AM, please contact night-coverage at [email or web address], password [email or web address] for any overnight needs.  02/14/2014, 7:49 AM

## 2014-02-15 ENCOUNTER — Ambulatory Visit: Payer: Medicare Other | Admitting: Physician Assistant

## 2014-02-15 LAB — CBC
HCT: 42.7 % (ref 39.0–52.0)
Hemoglobin: 12.6 g/dL — ABNORMAL LOW (ref 13.0–17.0)
MCH: 25.3 pg — ABNORMAL LOW (ref 26.0–34.0)
MCHC: 29.5 g/dL — AB (ref 30.0–36.0)
MCV: 85.6 fL (ref 78.0–100.0)
Platelets: 519 10*3/uL — ABNORMAL HIGH (ref 150–400)
RBC: 4.99 MIL/uL (ref 4.22–5.81)
RDW: 17.7 % — ABNORMAL HIGH (ref 11.5–15.5)
WBC: 16.8 10*3/uL — ABNORMAL HIGH (ref 4.0–10.5)

## 2014-02-15 LAB — BASIC METABOLIC PANEL
Anion gap: 12 (ref 5–15)
BUN: 39 mg/dL — ABNORMAL HIGH (ref 6–23)
CHLORIDE: 106 meq/L (ref 96–112)
CO2: 24 mEq/L (ref 19–32)
CREATININE: 1.49 mg/dL — AB (ref 0.50–1.35)
Calcium: 9.5 mg/dL (ref 8.4–10.5)
GFR calc Af Amer: 55 mL/min — ABNORMAL LOW (ref >90)
GFR calc non Af Amer: 48 mL/min — ABNORMAL LOW (ref >90)
Glucose, Bld: 110 mg/dL — ABNORMAL HIGH (ref 70–99)
POTASSIUM: 4.8 meq/L (ref 3.7–5.3)
Sodium: 142 mEq/L (ref 137–147)

## 2014-02-15 MED ORDER — MUPIROCIN 2 % EX OINT: 1.0000 "application " | TOPICAL_OINTMENT | Freq: Two times a day (BID) | CUTANEOUS | Status: AC

## 2014-02-15 MED ORDER — PRO-STAT SUGAR FREE PO LIQD: 30.0000 mL | Freq: Every day | ORAL | Status: AC | PRN

## 2014-02-15 MED ORDER — FLUCONAZOLE 200 MG PO TABS: 200.0000 mg | ORAL_TABLET | Freq: Every day | ORAL | Status: DC

## 2014-02-15 MED ORDER — HYDROCODONE-ACETAMINOPHEN 7.5-325 MG PO TABS: 1.0000 | ORAL_TABLET | ORAL | Status: AC | PRN

## 2014-02-15 NOTE — Discharge Instructions (Signed)

## 2014-02-15 NOTE — Progress Notes (Signed)
Pt for discharge to [facility].  CSW facilitated pt discharge needs including contacting facility, faxing pt discharge information via [facility], discussing with pt at bedside and pt son, [place] via telephone, providing RN phone number to call report, and arranging ambulance transport via [facility].  Pt coping appropriately with transition to [facility] and pt and pt son appreciative of CSW support and assistance.  No further social work needs identified at this time.  CSW signing off.   Alison Murray, MSW, [place] [employer] [phone]

## 2014-02-15 NOTE — Progress Notes (Signed)
Attempted to call report to [employer] x 4. No answer, then busy signal. Education officer, museum made aware.

## 2014-02-15 NOTE — Plan of Care (Signed)
Problem: Phase I Progression Outcomes Goal: OOB as tolerated unless otherwise ordered Outcome: Adequate for Discharge Goal: Hemodynamically stable Outcome: Completed/Met Date Met:  02/15/14 Goal: Other Phase I Outcomes/Goals Outcome: Completed/Met Date Met:  02/15/14

## 2014-02-15 NOTE — Progress Notes (Signed)
ANTIBIOTIC CONSULT NOTE  Pharmacy Consult for vancomycin/zosyn Indication: cellulitis  No Known Allergies  Patient Measurements: Height: 5\' 6"  (167.6 cm) Weight: 241 lb 10 oz (109.6 kg) IBW/kg (Calculated) : 63.8  Assessment: 63 YOM presents 11/27 with bilateral leg pain and groin pain. Recent R big toe laceration but found to have cellulitis of leg as well as groin/penis. 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 difficile colitis. Pharmacy is consulted to dose vancomycin and Zosyn for cellulitis.  Imaging 11/30 MRI R foot w/o contrast shows SQ and muscular edema favoring cellulitis and plantar myositis. No osteomyelitis observed  Antiinfectives 11/27 >> vancomycin >> 11/27 >> zosyn >>  12/1 >> fluconazole (MD) >>  Labs / vitals Tmax: remains afebrile WBCs: remains elevated Renal: SCr 1.49- improved (baseline 0.9), CrCl 58 ml/min CG, CrCl 51N  Microbiology MRSA PCR: positive  Drug level / dose changes info: 11/30: SCr jumped, decrease dose to Vanc 1500mg  IV Q24H from 1g q12  12/4: D8 vancomycin 1500mg  IV q24h and ZEI for cellulitis of BL LE/groin/penis. D4 fluconazole (MD) added per WOC recommendations.  Goal of Therapy:  Vancomycin trough level 10-15 mcg/ml  Appropriate antibiotic dosing for renal function; eradication of infection   Plan:  - continue vancomycin 1500mg  IV q24h - continue Zosyn 3.375G IV q8h, each dose to be infused over 4 hours - vancomycin trough at steady state if indicated - follow-up clinical course, culture results, renal function - follow-up antibiotic de-escalation and length of therapy  Thank you for the consult.  Currie Paris, PharmD, BCPS Pager: [phone] Pharmacy: [phone] 02/15/2014 10:08 AM

## 2014-02-15 NOTE — Discharge Summary (Addendum)
Physician Discharge Summary  Fernando Lane ZOX:[id number] DOB: [birth year] DOA: 02/08/2014  PCP: Gwendolyn Grant, MD  Admit date: 02/08/2014 Discharge date: 02/15/2014   Recommendations for Outpatient Follow-Up:    Recommend daily BMET until renal function back to usual baseline.  When renal function is back to usual baseline (creatinine of 1.00), can consider resumption of diuretics/lisinopril if BP can tolerate. D/C creatinine 1.49.  Please arrange for outpatient urology follow up as the patient has complaints of urinary leakage.   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  Diet recommendation: Low sodium, heart healthy.    Wound care recommendations: Twice daily skin care regimen to the groin and scrotum that includes cleansing with Coloplast Skin cleanser and pat dry then placement of the antimicrobial textile, InterDry Ag+. This is to be used without powders or creams in the affected area and will assist to "wick" away moisture as well as donate an antimicrobial property to the affected area. The LEs will benefit from daily care and application of a silicone non-adherent wound contact layer (Mepitel), topped with dry gauze, ABDs and Kerlix. The toes will be cleansed twice daily and a dry gauze placed between them to promote airflow and absorb drainage. Finally, provide bilateral heel boots to correct foot alignment and take the pressure off of the laterally rotated, feet which is contributing to the lateral wound formation.  History of Present Illness:   Fernando Lane is an 63 y.o. 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 difficile colitis, who was admitted on 02/08/14 with groin cellulitis and right great toe bleeding. On physical exam, patient was noted to have penile and scrotal erythema and a swollen right toe without evidence of active bleeding. Pt started on broad spectrum abx, vanco and zosyn for treatment of groin and LE infection.   Hospital Course by Problem:   Principal Problem:  Cellulitis of groin  Seen by wound care RN with recommendations for wound care as noted above.  Treated with a 7 day course of broad-spectrum antibiotics with vancomycin and Zosyn. Fluconazole added 02/12/14 due to concerns for fungal infection.  Will need treatment through 02/19/14.  Evaluated by urologist as well.  No evidence of necrotizing infection.  Active Problems:  Bilateral leg wounds  Wound care per recommendations above.   Continue bilateral Prevalon boots.  MRI 02/11/14, negative for osteomyelitis.   Obstructive sleep apnea  Continue CPAP at bedtime.   ARF  Continue to hold lasix and spironolactone. Creatinine improved, but not yet quite back to baseline.   POLYCYTHEMIA  Continue anagrelide 0.5 mg PO BID.   Obstructive sleep apnea  Continue to offer CPAP daily at bedtime. Note, has refused.   Essential hypertension  Continue Coreg. Lisinopril and diuretics on hold.   Coronary atherosclerosis/Peripheral vascular disease  Continue aspirin and Plavix.   Chronic systolic heart failure/Cardiomyopathy, ischemic  Holding diuretics given acute renal failure.  Monitor I and O and daily weights. Weight up 5 pounds over  the past 24 hours. I/O balance +1.3 L over the past 24 hours.   MRSA carrier   On decontamination therapy.    Medical Consultants:    Dr. Raynelle Bring, Urology.   Discharge Exam:    Filed Vitals:   02/15/14 0608  BP: 97/77  Pulse: 70  Temp: 97.9 F (36.6 C)  Resp: 16   Filed Vitals:   02/14/14 0014 02/14/14 0545 02/14/14 2121 02/15/14 0608  BP:  109/70 108/62 97/77  Pulse: 82 76 76 70  Temp:  97.8 F (36.6 C) 98.1 F (36.7 C) 97.9 F (36.6 C)  TempSrc:  Oral Oral Oral  Resp: 18 20 18 16   Height:      Weight:  110.9 kg (244 lb 7.8 oz)  109.6 kg (241 lb 10 oz)  SpO2: 94% 97% 97% 97%    Gen:  NAD Cardiovascular:  RRR, No M/R/G Respiratory: Lungs CTAB Gastrointestinal: Abdomen soft, NT/ND with normal active bowel sounds. Extremities: No C/E/C   The results of significant diagnostics from this hospitalization (including imaging, microbiology, ancillary and laboratory) are listed below for reference.     Procedures and Diagnostic Studies:   Dg Toe Great Right 02/08/2014 No acute bone abnormality to the right great toe.  Mr Foot Right Wo Contrast 02/11/2014 1. Subcutaneous and muscular edema signal favoring cellulitis and plantar myositis. No osteomyelitis observed. 2. Prior amputation of the fourth toe at the MTP joint.  Labs:   Basic Metabolic Panel:  Recent Labs Lab 02/09/14 0440 02/11/14 0500 02/13/14 0515 02/14/14 0525 02/15/14 0516  NA 140 136* 139 140 142  K 3.8 4.4 4.5 4.7 4.8  CL 103 99 103 102 106  CO2 24 21 21 25 24   GLUCOSE 116* 97 96 94 110*  BUN 30* 42* 51* 45* 39*  CREATININE 1.00 2.03* 2.02* 1.72* 1.49*  CALCIUM 8.8 8.8 9.1 9.4 9.5   GFR Estimated Creatinine Clearance: 58.2 mL/min (by C-G formula based on Cr of 1.49). Liver Function Tests:  Recent Labs Lab 02/09/14 0440  AST 12  ALT 8  ALKPHOS 144*  BILITOT 1.0  PROT 6.1  ALBUMIN 2.9*   CBC:  Recent Labs Lab 02/08/14 1610 02/09/14 0440 02/11/14 0500 02/13/14 0515 02/15/14 0516  WBC 19.3* 17.4* 17.2* 16.7* 16.8*  NEUTROABS 16.2* 15.1*  --   --   --   HGB 13.1 11.8* 11.9* 11.9* 12.6*  HCT 42.8 39.7 39.8 39.6 42.7  MCV 87.5 86.9 86.0 85.5  85.6  PLT 594* 561* 512* 482* 519*   Microbiology Recent Results (from the past 240 hour(s))  MRSA PCR Screening     Status: Abnormal   Collection Time: 02/08/14 11:23 PM  Result Value Ref Range Status   MRSA by PCR POSITIVE (A) NEGATIVE Final    Comment:        The GeneXpert MRSA Assay (FDA approved for NASAL specimens only), is one component of a comprehensive MRSA colonization surveillance program. It is not intended to diagnose MRSA infection nor to guide or monitor treatment for MRSA infections. RESULT CALLED TO, READ BACK BY AND VERIFIED WITH: RUSSELL,J RN AT 0139 11.28.15 BY TIBBITTS,K   Clostridium Difficile by PCR     Status: None   Collection Time: 02/13/14  8:46 AM  Result Value Ref Range Status   C difficile by pcr NEGATIVE NEGATIVE Final    Comment: Performed at [facility]     Discharge Instructions:   Discharge Instructions    Call MD for:  extreme  fatigue    Complete by:  As directed      Call MD for:  persistant dizziness or light-headedness    Complete by:  As directed      Call MD for:  persistant nausea and vomiting    Complete by:  As directed      Call MD for:  severe uncontrolled pain    Complete by:  As directed      Call MD for:  temperature >100.4    Complete by:  As directed      Diet - low sodium heart healthy    Complete by:  As directed      Discharge instructions    Complete by:  As directed   You were cared for by Dr. Jacquelynn Cree  (a hospitalist) during your hospital stay. If you have any questions about your discharge medications or the care you received while you were in the hospital after you are discharged, you can call the unit and ask to speak with the hospitalist on call if the hospitalist that took care of you is not available. Once you are discharged, your primary care physician will handle any further medical issues. Please note that NO REFILLS for any discharge medications will be authorized once you are discharged,  as it is imperative that you return to your primary care physician (or establish a relationship with a primary care physician if you do not have one) for your aftercare needs so that they can reassess your need for medications and monitor your lab values.  Any outstanding tests can be reviewed by your PCP at your follow up visit.  It is also important to review any medicine changes with your PCP.  Please bring these d/c instructions with you to your next visit so your physician can review these changes with you.     Increase activity slowly    Complete by:  As directed      Walk with assistance    Complete by:  As directed      Walker     Complete by:  As directed             Medication List    STOP taking these medications        ceFEPIme 1 g in dextrose 5 % 50 mL     furosemide 80 MG tablet  Commonly known as:  LASIX     lisinopril 5 MG tablet  Commonly known as:  PRINIVIL,ZESTRIL     spironolactone 25 MG tablet  Commonly known as:  ALDACTONE      TAKE these medications        acetaminophen 500 MG tablet  Commonly known as:  TYLENOL  Take 500 mg by mouth 2 (two) times daily.     anagrelide 0.5 MG capsule  Commonly known as:  AGRYLIN  Take 1 capsule (0.5 mg total) by mouth 2 (two) times daily.     aspirin 325 MG tablet  Take 1 tablet (325 mg total) by mouth daily.     atorvastatin 40 MG tablet  Commonly known as:  LIPITOR  Take 40 mg by mouth daily.     benzonatate 100 MG capsule  Commonly known as:  TESSALON  Take 100 mg by mouth 3 (three) times daily as needed for cough.     carvedilol 3.125 MG tablet  Commonly known as:  COREG  Take 1 tablet (3.125 mg total) by mouth 2 (two) times daily with a meal.  chlorhexidine 0.12 % solution  Commonly known as:  PERIDEX  Use as directed 15 mLs in the mouth or throat daily.     clopidogrel 75 MG tablet  Commonly known as:  PLAVIX  Take 75 mg by mouth daily at 6 PM.     colchicine 0.6 MG tablet  Take 0.6 mg by  mouth daily at 6 PM.     docusate sodium 100 MG capsule  Commonly known as:  COLACE  Take 100 mg by mouth 2 (two) times daily.     feeding supplement (ENSURE COMPLETE) Liqd  Take 237 mLs by mouth 2 (two) times daily between meals.     feeding supplement (PRO-STAT SUGAR FREE 64) Liqd  Place 30 mLs into feeding tube daily as needed (Pt wants to try).     fluconazole 200 MG tablet  Commonly known as:  DIFLUCAN  Take 1 tablet (200 mg total) by mouth daily.     folic acid 1 MG tablet  Commonly known as:  FOLVITE  Take 1 mg by mouth daily as needed (indigestion).     HYDROcodone-acetaminophen 7.5-325 MG per tablet  Commonly known as:  NORCO  Take 1 tablet by mouth every 4 (four) hours as needed for moderate pain.     lactobacillus acidophilus Tabs tablet  Take 2 tablets by mouth 2 (two) times daily.     multivitamin with minerals tablet  Take 1 tablet by mouth daily.     mupirocin ointment 2 %  Commonly known as:  BACTROBAN  Place 1 application into the nose 2 (two) times daily.     nitroGLYCERIN 0.4 MG SL tablet  Commonly known as:  NITROSTAT  Place 0.4 mg under the tongue every 5 (five) minutes as needed for chest pain.     pregabalin 50 MG capsule  Commonly known as:  LYRICA  Take 50 mg by mouth 3 (three) times daily.     ROZEREM 8 MG tablet  Generic drug:  ramelteon  Take 8 mg by mouth at bedtime.     saccharomyces boulardii 250 MG capsule  Commonly known as:  FLORASTOR  Take 1 capsule (250 mg total) by mouth 2 (two) times daily.     SANTYL ointment  Generic drug:  collagenase  Apply 1 application topically daily as needed (on buttocks).     senna-docusate 8.6-50 MG per tablet  Commonly known as:  Senokot-S  Take 1 tablet by mouth at bedtime.     thiamine 100 MG tablet  Take 1 tablet (100 mg total) by mouth daily.           Follow-up Information    Follow up with Gwendolyn Grant, MD. Schedule an appointment as soon as possible for a visit in 3 weeks.    Specialty:  Internal Medicine   Why:  Hospital follow up.   Contact information:   [address] [place] [address] [phone]        Time coordinating discharge: 35 minutes.  Signed:  Juel Bellerose  Pager [phone] [facility] 02/15/2014, 9:47 AM

## 2014-02-15 NOTE — Care Management Note (Signed)
02/15/14 Marney Doctor RN,BSN,NCM [phone]  Pt to DC to [facility] today.  No CM needs noted.  IM letter given to pt.

## 2014-02-15 NOTE — Progress Notes (Signed)
Lenward Chancellor, RN from [employer]. Report given to [facility].

## 2014-02-18 ENCOUNTER — Telehealth: Payer: Self-pay | Admitting: *Deleted

## 2014-02-18 NOTE — Telephone Encounter (Signed)
Received vm call from pt stating that he has missed an appt due to being in the hosp & rehab & request return call.  Pt was scheduled last with Dr Alvy Bimler.  Note to her nurse.  Call back # is [phone].

## 2014-02-19 ENCOUNTER — Encounter: Payer: Medicare Other | Admitting: Physician Assistant
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
## 2014-02-19 NOTE — Progress Notes (Signed)
This encounter was created in error - please disregard.

## 2014-02-20 ENCOUNTER — Telehealth: Payer: Self-pay | Admitting: *Deleted

## 2014-02-20 NOTE — Telephone Encounter (Signed)
Pt left message about missed appts last week due to hospitalization and rehab.  Called pt back at number given [phone]) and the VM said it was "Ivin Booty."   Called on cell phone list in EMR and left VM requesting he call nurse back about his appt.  We can get him r/s but need to know when he will be able to come.

## 2014-02-21 ENCOUNTER — Telehealth: Payer: Self-pay | Admitting: *Deleted

## 2014-02-21 ENCOUNTER — Encounter (HOSPITAL_COMMUNITY): Payer: Self-pay | Admitting: Cardiology

## 2014-02-21 NOTE — Telephone Encounter (Signed)
Please reschedule to next year, 30 minutes appointment

## 2014-02-21 NOTE — Telephone Encounter (Signed)
Family state pt at [facility] after hospital d/c.  He missed appt w/ Dr. Alvy Bimler last week due to being in hospital.  They ask that pt be r/s  To see Dr. Alvy Bimler and we call [facility] to arrange transportation at phone [phone].

## 2014-02-22 ENCOUNTER — Telehealth: Payer: Self-pay | Admitting: *Deleted

## 2014-02-22 ENCOUNTER — Non-Acute Institutional Stay (SKILLED_NURSING_FACILITY): Payer: Medicare Other | Admitting: Internal Medicine

## 2014-02-22 ENCOUNTER — Telehealth: Payer: Self-pay | Admitting: Oncology

## 2014-02-22 ENCOUNTER — Telehealth: Payer: Self-pay | Admitting: Hematology and Oncology

## 2014-02-22 ENCOUNTER — Other Ambulatory Visit: Payer: Self-pay | Admitting: *Deleted

## 2014-02-22 ENCOUNTER — Other Ambulatory Visit: Payer: Self-pay | Admitting: Internal Medicine

## 2014-02-22 DIAGNOSIS — I5022 Chronic systolic (congestive) heart failure: Secondary | ICD-10-CM

## 2014-02-22 DIAGNOSIS — D751 Secondary polycythemia: Secondary | ICD-10-CM

## 2014-02-22 DIAGNOSIS — R4781 Slurred speech: Secondary | ICD-10-CM

## 2014-02-22 DIAGNOSIS — I213 ST elevation (STEMI) myocardial infarction of unspecified site: Secondary | ICD-10-CM

## 2014-02-22 DIAGNOSIS — L03314 Cellulitis of groin: Secondary | ICD-10-CM

## 2014-02-22 NOTE — Telephone Encounter (Signed)
Medical records faxed to [facility] @ [phone] claim # [id number]

## 2014-02-22 NOTE — Progress Notes (Signed)
Patient ID: Fernando Lane, male   DOB: [birth year], 63 y.o.   MRN: [id number]   This is an acute visit.  Level care skilled.  [facility].  Chief complaint-acute visit status post hospitalization for cellulitis of the groin.  History of present illness.  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  He was admitted with groin cellulitis---and treated with a seven-day course of broad-spectrum antibiotics thank a mycin and Zosyn-condoms always added secondary to concerns of a fungal infection.  There was no evidence of necrotizing infection or urology.  Patient also has a history of polycythemia he is onAnagrelide-.  Also has a history of chronic systolic heart failure with cardiomyopathy-his diuretics initially were held secondary to acute renal failure--family and nursing staff reports she is having increased edema-at one point in the hospital he was on both Lasix and spironolactone-with recommendation to restart diuretics when renal function appear to be back at his baseline which appears to be in the low ones with the creatinine.  Of note we did order a chest x-ray which showed possible subsegmental atelectasis versus by basilar pneumonia-he has not been running a fever or  Having  complaints of increased cough  Currently patient has no complaints family feels over the last couple weeks he has some gradual slurring of speech-.  His vital signs remained stable.  Family medical social history is been reviewed per discharge note on 02/15/2014-they include.  Medical history includes.  Chronic venous stasis changes-chronic lower extremity wounds.  Polycythemia.  Obstructive sleep apnea.  Essential hypertension.  Coronary artery disease.  Per flow last liver disease.  Chronic systolic heart failure.  Ischemic  cardiomyopathy.  Acute renal failure.  Medications.  Tylenol 500 mg twice a day.  Anagrelide 0.5 mg twice a day.  Aspirin 325 mg daily.  Of tour 40 mg.  Tessalon Perles 100 mg 3 times a day when necessary.  Coreg 3.125 mg twice a day.  Peridex 15 mL and mouth daily.  Plavix 75 mg daily.  Colchicine 0.6 mg daily.  Colace 100 mg twice a day.  Diflucan 200 mg daily.  Folic acid 1 mg daily.  Vicodin 7. 07/16/2023 milligrams one tablet every 4 hours when necessary pain.  Lactobacillus 2 tablets twice a day.  Also vitamin daily. 8 mg daily at bedtime.  Florastor 250 mg twice a day.  Santyl 1 appellation topically daily on buttocks.  Senokot-S 1 tablet daily at bedtime.  Thiamine 100 mg daily.  Review of systems.  In general does complain of feeling weak but does not complaining of fever chills.  Skin has a history of significant cellulitis and venous stasis as stated above.  Head ears eyes nose mouth and throat-no visual changes or sore throat.  Respiratory-does not complain of shortness of breath or significant cough.  Cardiac does not complain of chest pain has significant venous stasis changes edema.  GI does not complain of abdominal discomfort nausea or vomiting diarrhea or constipation.  Muscle skeletal has significant weakness especially lower extremities ambulatory and wheelchair does not complain of pain however.  Neurologic does not complain of headache dizziness or syncopal-type episodes family feels he's had some increased slurring over the past few weeks.  Psych--does not complain of depression or anxiety.  Physical exam.  Temperature is 98.4 pulse 93 respirations 18 blood pressure 143/81-130/70.  In general this is a pleasant obese  male in no distress sitting comfortably in his wheelchair.  His skin is warm and dry he does have venous stasis changes to his lower legs bilaterally  Groin area  has some erythema I suspect this is    resolving however  Heart auscultation with shallow air entry there is no labored breathing.  Heart is regular rate and rhythm without murmur gallop or rub he has 1-2 plus lower extremity edema-venous stasis changes bilaterally.  Abdomen is obese soft nontender with positive bowel sounds.  Muscle skeletal is ambulate in a wheelchair moves all extremities 4 he does have wrapping of his feet.  Neurologic is grossly intact his speech appears to be clear although family at times apparently has noticed some slurring cranial nerves appear grossly intact-I do not see any lateralizing findings.  Psych he is alert and oriented pleasant and appropriate.  Labs.  02/21/2014.  WBC 20.3 hemoglobin 12.8 platelets 620.  Sodium 142 potassium 5.3 BUN 31 creatinine 1.2.  02/20/2014.  Liver function tests within normal limits except alkaline phosphatase of 140.  Assessment and plan.  #1-history of groin cellulitis-he has completed antibiotics this appears to be stable continue to monitor.  #6-RSWNIOE of systolic CHF-apparently with some increased edema-I did review his weights it appears he's actually lost about 4 pounds since admission-but he does have a significant history of CHF and apparently has some significantly increased edema will start Lasix 40 mg a day and monitor-at this point his renal insufficiency appears to have stabilized and back close to baseline-there was recommendation to consider restarting diuretics when his renal function appeared to be back at baseline  Her 3-acute renal insufficiency-again this appears to be resolving as noted above will check a metabolic panel on Monday-as well as a CBC.  #4-polycythemia-again he is on-- anagrelide--apparently there was some difficulty getting this started the facility but has been restarted-again will update a CBC on Monday to monitor.  #5-history of coronary artery disease he is on a statin as well as a beta blocker and Plavix as well as  aspirin.  #6-history of neuropathy-he continues on Lyrica.  #7 as possible history of ringing-I did discuss this with his family at bedside-he did have a CT scan of the head early in his hospital stay in November that showed no acute changes it-he did show some soft tissue swelling of the nose and forehead--apparently he sustained this in the fall.  Will attempt to obtain a follow-up CT at this point he appears stable however.  Patient will need close monitoring of vital signs pulse ox every shift with neuro checks notify provider of any increased temperature cough  [id number]-of note greater than 45 minutes spent assessing patient-reviewing his medical records-discussion with nursing staff and family-and coordinating and formulating a plan of care for numerous diagnoses-of note greater than 50% of time spent coordinating plan of care

## 2014-02-22 NOTE — Telephone Encounter (Signed)
, °

## 2014-02-22 NOTE — Telephone Encounter (Signed)
Spoke with family.

## 2014-02-25 ENCOUNTER — Non-Acute Institutional Stay (SKILLED_NURSING_FACILITY): Payer: Medicare Other | Admitting: Internal Medicine

## 2014-02-25 DIAGNOSIS — D751 Secondary polycythemia: Secondary | ICD-10-CM

## 2014-02-25 DIAGNOSIS — I5022 Chronic systolic (congestive) heart failure: Secondary | ICD-10-CM

## 2014-02-25 NOTE — Progress Notes (Signed)
Patient ID: Fernando Lane, male   DOB: 02-13-1951, 62 y.o.   MRN: [id number] Patient ID: Fernando Lane, male   DOB: [birth year], 63 y.o.   MRN: [id number]   This is an acute visit  Level care skilled.  [facility].  Chief complaint-acute visit follow-up CHF  History of present illness.  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  He was admitted with groin cellulitis--and treated with broad-spectrum antibiotics.  There was no evidence of necrotizing infection or urology.  Patient also has a history of polycythemia he is onAnagrelide-.  Also has a history of chronic systolic heart failure with cardiomyopathy-his diuretics initially were held secondary to acute renal failure--family and nursing staff reported he was  having increased edema-at one point in the hospital he was on both Lasix and spironolactone-with recommendation to restart diuretics when renal function appear to be back at his baseline which appears to be in the low ones with the creatinine.  Of note we did order a chest x-ray which showed possible subsegmental atelectasis versus by basilar pneumonia-he has not been running a fever or  Having  complaints of increased cough  We did restart him on Lasix 40 mg a day I am following up on this-it appears he is doing well he does have decreased edema from what I saw last week-nursing staff has not reported any issues.  I note occasional systolic blood pressures in the 90s however I took it manually today and got 116/76-.  He does not have really any complaints today.  I do note he does have a history of polycythemia last lab I see shows a platelet count of 620 this appears to be relatively stable   .  His vital signs remained stable.  Family medical social history is been reviewed per discharge note on  02/15/2014-they include.  Medical history includes.  Chronic venous stasis changes-chronic lower extremity wounds.  Polycythemia.  Obstructive sleep apnea.  Essential hypertension.  Coronary artery disease.  Per flow last liver disease.  Chronic systolic heart failure.  Ischemic cardiomyopathy.  Acute renal failure.  Medications.  Tylenol 500 mg twice a day.  Anagrelide 0.5 mg twice a day.  Aspirin 325 mg daily.  Of tour 40 mg.  Tessalon Perles 100 mg 3 times a day when necessary.  Coreg 3.125 mg twice a day.  Peridex 15 mL and mouth daily.  Plavix 75 mg daily.  Colchicine 0.6 mg daily.  Colace 100 mg twice a day.  Diflucan 200 mg daily.  Folic acid 1 mg daily.  Vicodin 7. 07/16/2023 milligrams one tablet every 4 hours when necessary pain.  Lactobacillus 2 tablets twice a day.  Also vitamin daily. 8 mg daily at bedtime.  Florastor 250 mg twice a day.  Santyl 1 appellation topically daily on buttocks.  Senokot-S 1 tablet daily at bedtime.  Thiamine 100 mg daily.  Review of systems.  In general  does not complaini of fever chills.  Skin has a history of significant cellulitis and venous stasis as stated above.--But this has been stable and improving  Head ears eyes nose mouth and throat-no visual changes or sore throat.  Respiratory-does not complain of shortness of breath or significant cough.  Cardiac does not complain of chest pain has significant venous stasis changes edema--however this appears somewhat improved from previous exam.  GI does not complain of abdominal  discomfort nausea or vomiting diarrhea or constipation.  Muscle skeletal has significant weakness especially lower extremities ambulatory and wheelchair does not complain of pain however.  Neurologic does not complain of headache dizziness or syncopal-type episodes family feels he's had some increased slurring over the past few weeks.  Psych--does not complain of  depression or anxiety.  Physical exam.  Temperature 96.9 pulse 64 respirations 18 blood pressure 116/76  In general this is a pleasant obese male in no distress sitting comfortably in his wheelchair.  His skin is warm and dry he does have venous stasis changes to his lower legs bilaterally--wrapping of his lower right leg and foot     Chest is clear auscultation with shallow air entry there is no labored breathing.  Heart is regular rate and rhythm without murmur gallop or rub he has 1- plus lower extremity edema-venous stasis changes bilaterally.-Again this improves improved from previous exam  Abdomen is obese soft nontender with positive bowel sounds.  Muscle skeletal is ambulate in a wheelchair moves all extremities 4.  Neurologic is grossly intact his speech appears to be clear.  Psych he is alert and oriented pleasant and appropriate.  Labs.  02/21/2014.  WBC 20.3 hemoglobin 12.8 platelets 620.  Sodium 142 potassium 5.3 BUN 31 creatinine 1.2.  02/20/2014.  Liver function tests within normal limits except alkaline phosphatase of 140.  Assessment and plan.  Marland Kitchen  #7-[id number] of systolic CHF-this appears to have stabilized-he is on Lasix 40 mg a day-diuretics held in the hospital secondary to renal issues but this appears to have stabilized as well-will await an updated BMP-clinically looks stable--I have reviewed blood pressures I see systolics ranging from [id number] write an order to hold Lasix for systolic less than 856 although I don't think this will happen very often-I do note he is on Coreg as well as low-dose 3.25 twice a day   2-acute renal insufficiency-again this appears to be resolvig--awaiting results of updated metabolic panel  #3-[id number] he is on-- anagrelide--apparently there was some difficulty getting this started the facility but has been restarted-we are monitoring CBCs  #4-history of coronary artery disease he is on a statin as well  as a beta blocker and Plavix as well as aspirin.--Does not complain of chest pain this appears to be stable  All in all despite patient's comorbidities he appears to be relatively stable but will need close monitoring-nursing staff does not report any recent acute issues.  [id number]       .   Marland Kitchen

## 2014-02-26 ENCOUNTER — Non-Acute Institutional Stay (SKILLED_NURSING_FACILITY): Payer: Medicare Other | Admitting: Internal Medicine

## 2014-02-26 ENCOUNTER — Encounter: Payer: Self-pay | Admitting: Internal Medicine

## 2014-02-26 ENCOUNTER — Telehealth: Payer: Self-pay | Admitting: Hematology and Oncology

## 2014-02-26 DIAGNOSIS — S81809A Unspecified open wound, unspecified lower leg, initial encounter: Secondary | ICD-10-CM | POA: Insufficient documentation

## 2014-02-26 DIAGNOSIS — G4733 Obstructive sleep apnea (adult) (pediatric): Secondary | ICD-10-CM

## 2014-02-26 DIAGNOSIS — S81801S Unspecified open wound, right lower leg, sequela: Secondary | ICD-10-CM

## 2014-02-26 DIAGNOSIS — I25118 Atherosclerotic heart disease of native coronary artery with other forms of angina pectoris: Secondary | ICD-10-CM

## 2014-02-26 DIAGNOSIS — L03314 Cellulitis of groin: Secondary | ICD-10-CM

## 2014-02-26 DIAGNOSIS — N179 Acute kidney failure, unspecified: Secondary | ICD-10-CM

## 2014-02-26 DIAGNOSIS — D751 Secondary polycythemia: Secondary | ICD-10-CM

## 2014-02-26 DIAGNOSIS — I739 Peripheral vascular disease, unspecified: Secondary | ICD-10-CM

## 2014-02-26 DIAGNOSIS — I10 Essential (primary) hypertension: Secondary | ICD-10-CM

## 2014-02-26 DIAGNOSIS — I5022 Chronic systolic (congestive) heart failure: Secondary | ICD-10-CM

## 2014-02-26 NOTE — Assessment & Plan Note (Signed)
   Continue aspirin and Plavix

## 2014-02-26 NOTE — Assessment & Plan Note (Signed)
   Continue anagrelide 0.5 mg PO BID

## 2014-02-26 NOTE — Progress Notes (Signed)
MRN: [id number] Name: Fernando Lane  Sex: male Age: 63 y.o. DOB: [birth year]  [facility] #: [facility] Facility/Room:107 Level Of Care: SNF Provider: Inocencio Homes D Emergency Contacts: Extended Emergency Contact Information Primary Emergency Contact: [address] [facility] Address: [address]          [place], [place] [address] Johnnette Litter of [place] Phone: [phone] Work Phone: [phone] Mobile Phone: [phone] Relation: Son Secondary Emergency Contact: [place], [place] [address] Johnnette Litter of [place] Mobile Phone: [phone] Relation: Daughter     Allergies: Review of patient's allergies indicates no known allergies.  Chief Complaint  Patient presents with  . New Admit To SNF    HPI: Patient is 63 y.o. male who was hospitalized for groin cellulitis and is admited to SNF for IV abx until 12/8.  Past Medical History  Diagnosis Date  . Chronic low back pain   . 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 Chronic neck pain   . OSA (obstructive sleep apnea)     CPAP @ bedtime  . Allergy   . Unspecified essential hypertension     Resistant, 2D Echo - [id number]  . Polycythemia   . CAD (coronary artery disease)     Vessel type unspecified  . PVD (peripheral vascular disease)   . DDD (degenerative disc disease), cervical     Past Surgical History  Procedure Laterality Date  . Lower extrmity doppler      [place] 2008; no evidence for PAD  . US echocardiography  11/2008    Normal valves, mild LAE  . Left knee replacement  10/2006  . Cervical spine surgery  02/2008  . Joint replacement    . Spine surgery    . 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 Left heart catheterization with coronary angiogram N/A 08/08/2013    Procedure: LEFT HEART CATHETERIZATION WITH CORONARY ANGIOGRAM;  Surgeon:  Peter M Martinique, MD;  Location: [facility] CATH LAB;  Service: Cardiovascular;  Laterality: N/A;  . Pericardial tap N/A 08/10/2013    Procedure: PERICARDIAL TAP;  Surgeon: Burnell Blanks, MD;  Location: [facility] CATH LAB;  Service: Cardiovascular;  Laterality: N/A;      Medication List       This list is accurate as of: 02/26/14 10:58 PM.  Always use your most recent med list.               acetaminophen 500 MG tablet  Commonly known as:  TYLENOL  Take 500 mg by mouth 2 (two) times daily.     anagrelide 0.5 MG capsule  Commonly known as:  AGRYLIN  Take 1 capsule (0.5 mg total) by mouth 2 (two) times daily.     aspirin 325 MG tablet  Take 1 tablet (325 mg total) by mouth daily.     atorvastatin 40 MG tablet  Commonly known as:  LIPITOR  Take 40 mg by mouth daily.     benzonatate 100 MG capsule  Commonly known as:  TESSALON  Take 100 mg by mouth 3 (three) times daily as needed for cough.     carvedilol 3.125 MG tablet  Commonly known as:  COREG  Take 1 tablet (3.125 mg total) by mouth 2 (two) times daily with a meal.     chlorhexidine 0.12 % solution  Commonly  known as:  PERIDEX  Use as directed 15 mLs in the mouth or throat daily.     clopidogrel 75 MG tablet  Commonly known as:  PLAVIX  Take 75 mg by mouth daily at 6 PM.     colchicine 0.6 MG tablet  Take 0.6 mg by mouth daily at 6 PM.     docusate sodium 100 MG capsule  Commonly known as:  COLACE  Take 100 mg by mouth 2 (two) times daily.     feeding supplement (ENSURE COMPLETE) Liqd  Take 237 mLs by mouth 2 (two) times daily between meals.     feeding supplement (PRO-STAT SUGAR FREE 64) Liqd  Place 30 mLs into feeding tube daily as needed (Pt wants to try).     fluconazole 200 MG tablet  Commonly known as:  DIFLUCAN  Take 1 tablet (200 mg total) by mouth daily.     folic acid 1 MG tablet  Commonly known as:  FOLVITE  Take 1 mg by mouth daily as needed (indigestion).     HYDROcodone-acetaminophen 7.5-325 MG  per tablet  Commonly known as:  NORCO  Take 1 tablet by mouth every 4 (four) hours as needed for moderate pain.     lactobacillus acidophilus Tabs tablet  Take 2 tablets by mouth 2 (two) times daily.     multivitamin with minerals tablet  Take 1 tablet by mouth daily.     mupirocin ointment 2 %  Commonly known as:  BACTROBAN  Place 1 application into the nose 2 (two) times daily.     nitroGLYCERIN 0.4 MG SL tablet  Commonly known as:  NITROSTAT  Place 0.4 mg under the tongue every 5 (five) minutes as needed for chest pain.     pregabalin 50 MG capsule  Commonly known as:  LYRICA  Take 50 mg by mouth 3 (three) times daily.     ROZEREM 8 MG tablet  Generic drug:  ramelteon  Take 8 mg by mouth at bedtime.     SANTYL ointment  Generic drug:  collagenase  Apply 1 application topically daily as needed (on buttocks).     senna-docusate 8.6-50 MG per tablet  Commonly known as:  Senokot-S  Take 1 tablet by mouth at bedtime.     thiamine 100 MG tablet  Take 1 tablet (100 mg total) by mouth daily.        No orders of the defined types were placed in this encounter.    Immunization History  Administered Date(s) Administered  . Influenza Whole 02/13/2008, 01/28/2010  . Influenza,inj,Quad PF,36+ Mos 12/20/2012  . Td 03/17/2007    History  Substance Use Topics  . Smoking status: Former Smoker -- 0 years    Types: Cigarettes, Pipe    Quit date: 03/15/1968  . Smokeless tobacco: Former Systems developer     Comment: Pt quit Cigarettes 1970's- and cigars & chew 1990  . Alcohol Use: 3.6 oz/week    6 Cans of beer per week    Family history is noncontributory    Review of Systems  DATA OBTAINED: from patient GENERAL:  no fevers, fatigue, appetite changes SKIN: thinks penis is not better but denies worsening pain EYES: No eye pain, redness, discharge EARS: No earache, tinnitus, change in hearing NOSE: No congestion, drainage or bleeding  MOUTH/THROAT: No mouth or tooth pain, No  sore throat RESPIRATORY: No cough, wheezing, SOB CARDIAC: No chest pain, palpitations, lower extremity edema  GI: No abdominal pain, No N/V/D or constipation, No  heartburn or reflux  GU: No dysuria, frequency or urgency, or incontinence  MUSCULOSKELETAL: No unrelieved bone/joint pain NEUROLOGIC: No headache, dizziness or focal weakness PSYCHIATRIC: No overt anxiety or sadness, No behavior issue.   Filed Vitals:   02/26/14 1522  BP: 113/75  Pulse: 76  Temp: 97.7 F (36.5 C)  Resp: 18    Physical Exam  GENERAL APPEARANCE: Alert, conversant,  No acute distress.  SKIN: see GU HEAD: Normocephalic, atraumatic  EYES: Conjunctiva/lids clear. Pupils round, reactive. EOMs intact.  EARS: External exam WNL, canals clear. Hearing grossly normal.  NOSE: No deformity or discharge.  MOUTH/THROAT: Lips w/o lesions  RESPIRATORY: Breathing is even, unlabored. Lung sounds are clear   CARDIOVASCULAR: Heart RRR no murmurs, rubs or gallops. No peripheral edema.   GASTROINTESTINAL: Abdomen is soft, non-tender, not distended w/ normal bowel sounds. GENITOURINARY: Bladder non tender, not distended ; scrotum and penis with obviously resolving redness and swelliing;no heat MUSCULOSKELETAL: No abnormal joints or musculature NEUROLOGIC:  Cranial nerves 2-12 grossly intact. Moves all extremities  PSYCHIATRIC: Mood and affect appropriate to situation, no behavioral issues  Patient Active Problem List   Diagnosis Date Noted  . Wound of lower extremity 02/26/2014  . Acute renal failure 02/13/2014  . Cellulitis of groin 02/08/2014  . DNR (do not resuscitate) 11/27/2013  . Right heart failure 11/21/2013  . Cardiomyopathy, ischemic 11/17/2013  . Septic shock 11/15/2013  . C. difficile colitis 11/15/2013  . 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 Chronic systolic heart failure 08/65/7846  . ST elevation myocardial infarction (STEMI) in recovery phase 08/07/2013  . Peripheral vascular disease 12/30/2009  . POLYCYTHEMIA  10/30/2008  . Obstructive sleep apnea 10/30/2008  . Essential hypertension 10/30/2008  . Coronary atherosclerosis 10/30/2008  . DEGENERATIVE DISC DISEASE 08/14/2007    CBC    Component Value Date/Time   WBC 16.8* 02/15/2014 0516   WBC 12.9* 04/04/2013 1125   RBC 4.99 02/15/2014 0516   RBC 5.23 04/04/2013 1125   HGB 12.6* 02/15/2014 0516   HGB 15.1 04/04/2013 1125   HCT 42.7 02/15/2014 0516   HCT 46.9 04/04/2013 1125   PLT 519* 02/15/2014 0516   PLT 459* 04/04/2013 1125   MCV 85.6 02/15/2014 0516   MCV 89.7 04/04/2013 1125   LYMPHSABS 1.0 02/09/2014 0440   LYMPHSABS 1.5 04/04/2013 1125   MONOABS 0.4 02/09/2014 0440   MONOABS 0.2 04/04/2013 1125   EOSABS 0.9* 02/09/2014 0440   EOSABS 0.8* 04/04/2013 1125   BASOSABS 0.0 02/09/2014 0440   BASOSABS 0.0 04/04/2013 1125    CMP     Component Value Date/Time   NA 142 02/15/2014 0516   NA 136 03/02/2013 1055   K 4.8 02/15/2014 0516   K 4.2 03/02/2013 1055   CL 106 02/15/2014 0516   CL 108* 08/04/2012 1302   CO2 24 02/15/2014 0516   CO2 22 03/02/2013 1055   GLUCOSE 110* 02/15/2014 0516   GLUCOSE 111 03/02/2013 1055   GLUCOSE 164* 08/04/2012 1302   BUN 39* 02/15/2014 0516   BUN 13.8 03/02/2013 1055   CREATININE 1.49* 02/15/2014 0516   CREATININE 0.7 03/02/2013 1055   CALCIUM 9.5 02/15/2014 0516   CALCIUM 9.7 03/02/2013 1055   PROT 6.1 02/09/2014 0440   PROT 6.9 03/02/2013 1055   ALBUMIN 2.9* 02/09/2014 0440   ALBUMIN 3.1* 03/02/2013 1055   AST 12 02/09/2014 0440   AST 12 03/02/2013 1055   ALT 8 02/09/2014 0440   ALT 13 03/02/2013 1055   ALKPHOS 144* 02/09/2014 0440  ALKPHOS 134 03/02/2013 1055   BILITOT 1.0 02/09/2014 0440   BILITOT 0.45 03/02/2013 1055   GFRNONAA 48* 02/15/2014 0516   GFRAA 55* 02/15/2014 0516    Assessment and Plan  Cellulitis of groin  Treated with a 7 day course of broad-spectrum antibiotics with vancomycin and Zosyn. Fluconazole added 02/12/14 due to concerns for fungal infection.  Will need treatment through 02/19/14.  Evaluated by urologist as well. No evidence of necrotizing infection  Wound care recommendations: Twice daily skin care regimen to the groin and scrotum that includes cleansing with Coloplast Skin cleanser and pat dry then placement of the antimicrobial textile, InterDry Ag+. This is to be used without powders or creams in the affected area and will assist to "wick" away moisture as well as donate an antimicrobial property to the affected area. The LEs will benefit from daily care and application of a silicone non-adherent wound contact layer (Mepitel), topped with dry gauze, ABDs and Kerlix. The toes will be cleansed twice daily and a dry gauze placed between them to promote airflow and absorb drainage. Finally, provide bilateral heel boots to correct foot alignment and take the pressure off of the laterally rotated, feet which is contributing to the lateral wound formation.    Wound of lower extremity  Wound care per recommendations above.   Continue bilateral Prevalon boots.  MRI 02/11/14, negative for osteomyelitis  Acute renal failure  Continue to hold lasix and spironolactone. Creatinine improved, but not yet quite back to baseline  POLYCYTHEMIA  Continue anagrelide 0.5 mg PO BID  Obstructive sleep apnea  Continue to offer CPAP daily at bedtime. Note, has refused  Coronary atherosclerosis  Continue aspirin and Plavix  Peripheral vascular disease  Continue aspirin and Plavix  Essential hypertension Continue coreg;lisinopril andlasix on hold  Chronic systolic heart failure  Holding diuretics given acute renal failure.;hold ACE, cont coreg    Hennie Duos, MD

## 2014-02-26 NOTE — Assessment & Plan Note (Signed)
   Continue to offer CPAP daily at bedtime. Note, has refused

## 2014-02-26 NOTE — Assessment & Plan Note (Signed)
   Treated with a 7 day course of broad-spectrum antibiotics with vancomycin and Zosyn. Fluconazole added 02/12/14 due to concerns for fungal infection. Will need treatment through 02/19/14.  Evaluated by urologist as well. No evidence of necrotizing infection  Wound care recommendations: Twice daily skin care regimen to the groin and scrotum that includes cleansing with Coloplast Skin cleanser and pat dry then placement of the antimicrobial textile, InterDry Ag+. This is to be used without powders or creams in the affected area and will assist to "wick" away moisture as well as donate an antimicrobial property to the affected area. The LEs will benefit from daily care and application of a silicone non-adherent wound contact layer (Mepitel), topped with dry gauze, ABDs and Kerlix. The toes will be cleansed twice daily and a dry gauze placed between them to promote airflow and absorb drainage. Finally, provide bilateral heel boots to correct foot alignment and take the pressure off of the laterally rotated, feet which is contributing to the lateral wound formation.

## 2014-02-26 NOTE — Telephone Encounter (Signed)
pt called in to confirm next appt. pt given next appt for lb/NG 03/21/14 @ 2:30pm.

## 2014-02-26 NOTE — Assessment & Plan Note (Signed)
   Wound care per recommendations above.   Continue bilateral Prevalon boots.  MRI 02/11/14, negative for osteomyelitis

## 2014-02-26 NOTE — Assessment & Plan Note (Signed)
   Holding diuretics given acute renal failure.;hold ACE, cont coreg

## 2014-02-26 NOTE — Assessment & Plan Note (Signed)
   Continue to hold lasix and spironolactone. Creatinine improved, but not yet quite back to baseline

## 2014-02-26 NOTE — Assessment & Plan Note (Signed)
Continue coreg;lisinopril andlasix on hold

## 2014-02-27 ENCOUNTER — Ambulatory Visit (HOSPITAL_COMMUNITY)
Admission: RE | Admit: 2014-02-27 | Discharge: 2014-02-27 | Disposition: A | Discharge status: Home / Self Care | Payer: Medicare Other | Source: Ambulatory Visit | Attending: Internal Medicine | Admitting: Internal Medicine

## 2014-02-27 DIAGNOSIS — I10 Essential (primary) hypertension: Secondary | ICD-10-CM | POA: Diagnosis not present

## 2014-02-27 DIAGNOSIS — R4781 Slurred speech: Secondary | ICD-10-CM | POA: Diagnosis present

## 2014-02-27 DIAGNOSIS — R410 Disorientation, unspecified: Secondary | ICD-10-CM | POA: Diagnosis not present

## 2014-03-01 ENCOUNTER — Encounter: Payer: Self-pay | Admitting: Internal Medicine

## 2014-03-07 ENCOUNTER — Non-Acute Institutional Stay (SKILLED_NURSING_FACILITY): Payer: Medicare Other | Admitting: Internal Medicine

## 2014-03-07 DIAGNOSIS — L03314 Cellulitis of groin: Secondary | ICD-10-CM

## 2014-03-07 DIAGNOSIS — N179 Acute kidney failure, unspecified: Secondary | ICD-10-CM

## 2014-03-07 DIAGNOSIS — I10 Essential (primary) hypertension: Secondary | ICD-10-CM

## 2014-03-07 DIAGNOSIS — D751 Secondary polycythemia: Secondary | ICD-10-CM

## 2014-03-07 DIAGNOSIS — I2510 Atherosclerotic heart disease of native coronary artery without angina pectoris: Secondary | ICD-10-CM

## 2014-03-07 DIAGNOSIS — I5022 Chronic systolic (congestive) heart failure: Secondary | ICD-10-CM

## 2014-03-07 NOTE — Progress Notes (Signed)
Patient ID: Fernando Lane, male   DOB: Nov 16, 1950, 63 y.o.   MRN: [id number]   this is a discharge note.  Level care skilled.  [facility].   HPI: Patient is 63 y.o. male who was hospitalized for groin cellulitis and is admited to SNF for IV abx until 12/8.--This appears to have resolved. Patient does have a history of chronic systolic heart failure with cardiomyopathy- Patient did develop some increased edema during his stay here and his diuretic has been restarted After his creatinine returned back to its baseline-it had been held in the hospital secondary to renal issues.  He also has a history of polycythemia-he is on anagrelide and this appears to be stable per recent lab done last week.  Patient also had a CT of his head done during his stay here secondary to family concerns of some occasional slurring-CT did not show any acute process which was reassuring.  According to nursing staff he is done quite well here he will be going home with his family who is very supportive he would benefit from continued PT and OT for strengthening-also will need a rolling walker for ambulation assistance.  As well as nursing support for his numerous medical issues including congestive heart failure polycythemia and weakness.      Past Medical History  Diagnosis Date  . Chronic low back pain   . 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 Chronic neck pain   . OSA (obstructive sleep apnea)     CPAP @ bedtime  . Allergy   . Unspecified essential hypertension     Resistant, 2D Echo - EF-55-60  . Polycythemia   . CAD (coronary artery disease)     Vessel type unspecified  . PVD (peripheral vascular disease)   . DDD (degenerative disc disease), cervical     Past Surgical History  Procedure Laterality Date  . Lower extrmity doppler      [place] 2008; no evidence for PAD  . US echocardiography  11/2008    Normal valves, mild LAE  . Left knee  replacement  10/2006  . Cervical spine surgery  02/2008  . Joint replacement    . Spine surgery    . 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;  Service: Vascular;  Laterality: Right;  . Left heart catheterization with coronary angiogram N/A 08/08/2013    Procedure: LEFT HEART CATHETERIZATION WITH CORONARY ANGIOGRAM;  Surgeon: Peter M Martinique, MD;  Location: [facility] C[facility] LAB;  Service: Cardiovascular;  Laterality: N/A;  . Pericardial tap N/A 08/10/2013    Procedure: PERICARDIAL TAP;  Surgeon: Burnell Blanks, MD;  Location: [facility] CATH LAB;  Service: Cardiovascular;  Laterality: N/A;      Medication List               acetaminophen 500 MG tablet  Commonly known as:  TYLENOL  Take 500 mg by mouth 2 (two) times daily.     anagrelide 0.5 MG capsule  Commonly known as:  AGRYLIN  Take 1 capsule (0.5 mg total) by mouth 2 (two) times daily.     aspirin 325 MG tablet  Take 1 tablet (325 mg total) by mouth daily.     atorvastatin 40 MG tablet  Commonly known as:  LIPITOR  Take 40 mg by mouth daily.     benzonatate 100 MG capsule  Commonly known as:  TESSALON  Take 100 mg by mouth 3 (three) times daily as needed for cough.     carvedilol 3.125 MG tablet  Commonly known as:  COREG  Take 1 tablet (3.125 mg total) by mouth 2 (two) times daily with a meal.     chlorhexidine 0.12 % solution  Commonly known as:  PERIDEX  Use as directed 15 mLs in the mouth or throat daily.     clopidogrel 75 MG tablet  Commonly known as:  PLAVIX  Take 75 mg by mouth daily at 6 PM.     colchicine 0.6 MG tablet  Take 0.6 mg by mouth daily at 6 PM.     docusate sodium 100 MG capsule  Commonly known as:  COLACE  Take 100 mg by mouth 2 (two) times daily.     feeding supplement (ENSURE COMPLETE) Liqd  Take 237 mLs by mouth 2 (two) times daily between meals.     feeding supplement (PRO-STAT SUGAR FREE 64) Liqd  Place 30 mLs into  feeding tube daily as needed (Pt wants to try).      Lasix 40 mg.  1 tab daily          folic acid 1 MG tablet  Commonly known as:  FOLVITE  Take 1 mg by mouth daily as needed (indigestion).     HYDROcodone-acetaminophen 7.5-325 MG per tablet  Commonly known as:  NORCO  Take 1 tablet by mouth every 4 (four) hours as needed for moderate pain.     lactobacillus acidophilus Tabs tablet  Take 2 tablets by mouth 2 (two) times daily.     multivitamin with minerals tablet  Take 1 tablet by mouth daily.     mupirocin ointment 2 %  Commonly known as:  BACTROBAN  Place 1 application into the nose 2 (two) times daily.     nitroGLYCERIN 0.4 MG SL tablet  Commonly known as:  NITROSTAT  Place 0.4 mg under the tongue every 5 (five) minutes as needed for chest pain.     pregabalin 50 MG capsule  Commonly known as:  LYRICA  Take 50 mg by mouth 3 (three) times daily.     ROZEREM 8 MG tablet  Generic drug:  ramelteon  Take 8 mg by mouth at bedtime.     SANTYL ointment  Generic drug:  collagenase  Apply 1 application topically daily as needed (on buttocks).     senna-docusate 8.6-50 MG per tablet  Commonly known as:  Senokot-S  Take 1 tablet by mouth at bedtime.     thiamine 100 MG tablet  Take 1 tablet (100 mg total) by mouth daily.        No orders of the defined types were placed in this encounter.    Immunization History  Administered Date(s) Administered  . Influenza Whole 02/13/2008, 01/28/2010  . Influenza,inj,Quad PF,36+ Mos 12/20/2012  . Td 03/17/2007    History  Substance Use Topics  . Smoking status: Former Smoker -- 0 years    Types: Cigarettes, Pipe    Quit date: 03/15/1968  . Smokeless tobacco: Former Systems developer     Comment: Pt quit Cigarettes 1970's- and cigars & chew 1990  . Alcohol Use: 3.6 oz/week    6 Cans of beer per week    Family history is noncontributory    Review of Systems  DATA OBTAINED: from patient GENERAL:  no fevers, fatigue,  appetite changes SKIN: Groin cellulitis appears considerably improved EYES: No eye pain, redness, discharge EARS: No earache, tinnitus,  change in hearing NOSE: No congestion, drainage or bleeding  MOUTH/THROAT: No mouth or tooth pain, No sore throat RESPIRATORY: No cough, wheezing, SOB CARDIAC: No chest pain, palpitations baseline to slightly improved, lower extremity edema  GI: No abdominal pain, No N/V/D or constipation, No heartburn or reflux  GU: No dysuria, frequency or urgency, or incontinence  MUSCULOSKELETAL: No unrelieved bone/joint pain NEUROLOGIC: No headache, dizziness or focal weakness PSYCHIATRIC: No overt anxiety or sadness, No behavior issue.      Physical Exam T-. 97.3 pulse 69 respirations 20 blood pressure 134/73 this all appears to be baseline O2 saturation has been in the 90s on room air-at times with exertion will drop into the high 80s-- GENERAL APPEARANCE: Alert, conversant,  No acute distress.  SKIN: Warm and dry-he has some mild erythema in the groin areas but this appears to be largely resolved- still has some penile edema HEAD: Normocephalic, atraumatic  EYES: Conjunctiva/lids clear. Pupils round, reactive. EOMs intact.  EARS: External exam WNL, canals clear. Hearing grossly normal.  NOSE: No deformity or discharge.  MOUTH/THROAT: Lips w/o lesions  RESPIRATORY: Breathing is even, unlabored. Lung sounds are clear   CARDIOVASCULAR: Heart RRR no murmurs, rubs or gallops. Baseline to improved  peripheral edema.   GASTROINTESTINAL: Abdomen is soft, non-tender, not distended w/ normal bowel sounds. GENITOURINARY: Bladder non tender, not distended ; scrotum and peniscontinues resolving redness and swelliing;no heat MUSCULOSKELETAL: No abnormal joints or musculature--lower legs and feet are wrapped-toes are exposed there is positive capillary refill touch sensation appears to be intact  Bilaterally He is using a rolling walker at times but still is significantly  weak    NEUROLOGIC:  Cranial nerves 2-12 grossly intact. Moves all extremities  PSYCHIATRIC: Mood and affect appropriate to situation, no behavioral issues  Patient Active Problem List   Diagnosis Date Noted  . Wound of lower extremity 02/26/2014  . Acute renal failure 02/13/2014  . Cellulitis of groin 02/08/2014  . DNR (do not resuscitate) 11/27/2013  . Right heart failure 11/21/2013  . Cardiomyopathy, ischemic 11/17/2013  . Septic shock 11/15/2013  . C. difficile colitis 11/15/2013  . 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 Chronic systolic heart failure 78/24/2353  . ST elevation myocardial infarction (STEMI) in recovery phase 08/07/2013  . Peripheral vascular disease 12/30/2009  . POLYCYTHEMIA 10/30/2008  . Obstructive sleep apnea 10/30/2008  . Essential hypertension 10/30/2008  . Coronary atherosclerosis 10/30/2008  . [place] DISEASE 08/14/2007    02/28/2014.  Sodium 142 potassium 4.9 BUN 29 creatinine 1.0.  WBC 18.1 hemoglobin 13.6 platelets 583     Component Value Date/Time   WBC 16.8* 02/15/2014 0516   WBC 12.9* 04/04/2013 1125   RBC 4.99 02/15/2014 0516   RBC 5.23 04/04/2013 1125   HGB 12.6* 02/15/2014 0516   HGB 15.1 04/04/2013 1125   HCT 42.7 02/15/2014 0516   HCT 46.9 04/04/2013 1125   PLT 519* 02/15/2014 0516   PLT 459* 04/04/2013 1125   MCV 85.6 02/15/2014 0516   MCV 89.7 04/04/2013 1125   LYMPHSABS 1.0 02/09/2014 0440   LYMPHSABS 1.5 04/04/2013 1125   MONOABS 0.4 02/09/2014 0440   MONOABS 0.2 04/04/2013 1125   EOSABS 0.9* 02/09/2014 0440   EOSABS 0.8* 04/04/2013 1125   BASOSABS 0.0 02/09/2014 0440   BASOSABS 0.0 04/04/2013 1125    CMP     Component Value Date/Time   NA 142 02/15/2014 0516   NA 136 03/02/2013 1055   K 4.8 02/15/2014 0516   K 4.2 03/02/2013 1055  CL 106 02/15/2014 0516   CL 108* 08/04/2012 1302   CO2 24 02/15/2014 0516   CO2 22 03/02/2013 1055   GLUCOSE 110* 02/15/2014 0516   GLUCOSE 111 03/02/2013 1055   GLUCOSE 164*  08/04/2012 1302   BUN 39* 02/15/2014 0516   BUN 13.8 03/02/2013 1055   CREATININE 1.49* 02/15/2014 0516   CREATININE 0.7 03/02/2013 1055   CALCIUM 9.5 02/15/2014 0516   CALCIUM 9.7 03/02/2013 1055   PROT 6.1 02/09/2014 0440   PROT 6.9 03/02/2013 1055   ALBUMIN 2.9* 02/09/2014 0440   ALBUMIN 3.1* 03/02/2013 1055   AST 12 02/09/2014 0440   AST 12 03/02/2013 1055   ALT 8 02/09/2014 0440   ALT 13 03/02/2013 1055   ALKPHOS 144* 02/09/2014 0440   ALKPHOS 134 03/02/2013 1055   BILITOT 1.0 02/09/2014 0440   BILITOT 0.45 03/02/2013 1055   GFRNONAA 48* 02/15/2014 0516   GFRAA 55* 02/15/2014 0516    Assessment and Plan  Cellulitis of groin  Treated with a 7 day course of broad-spectrum antibiotics with vancomycin and Zosyn. Fluconazole added 02/12/14 due to concerns for fungal infection.He completed that on December 8.  Evaluated by urologist as well. No evidence of necrotizing infection--this appears to be essentially resolved    .    Wound of lower extremity  Wound care per home health  Continue bilateral Prevalon boots.  MRI 02/11/14, negative for osteomyelitis  Acute renal failure This appears resolved-his Lasix has been restarted at 40 mg a day-labs have been stable will try to update this before discharge-since it is the holiday this may be a challenge-if so home health will have to draw this early next week-but clinically he appears stable  POLYCYTHEMIA  Continue anagrelide 0.5 mg PO BID-labs indicate stability with elevated white count and mildly elevated platelets which is not new  Obstructive sleep apnea  Continues with CPAP  Coronary atherosclerosis  Continue aspirin and Plavix--this is been stable--he also is on a statin  Peripheral vascular disease  Continue aspirin and Plavix  Essential hypertension Continue coreg;lisinopril andlasix --blood pressures have been stable there are orders to hold Lasix for systolic less than 045 although this does not  appear to occur much at all  Chronic systolic heart failure  At this point appears stable-continues on lisinopril and Lasix.-- Coreg    Patient has improved clinically has gotten stronger but will need continued PT and OT for significant weakness-he was nursing support for multiple medical issues including CHF-follow-up cellulitis-he also will need a rolling walker to aid ambulation  He  will be living with his son who is very supportive  [id number]-of note greater than 30 minutes spent on this discharge summary-scripts have been written

## 2014-03-12 ENCOUNTER — Encounter: Payer: Self-pay | Admitting: Internal Medicine

## 2014-03-12 ENCOUNTER — Non-Acute Institutional Stay (SKILLED_NURSING_FACILITY): Payer: Medicare Other | Admitting: Internal Medicine

## 2014-03-12 DIAGNOSIS — R059 Cough, unspecified: Secondary | ICD-10-CM

## 2014-03-12 DIAGNOSIS — R05 Cough: Secondary | ICD-10-CM

## 2014-03-12 NOTE — Progress Notes (Signed)
MRN: [id number] Name: Fernando Lane  Sex: male Age: 63 y.o. DOB: May 16, 1950  [place] #: [facility] Facility/Room: 424 Level Of Care: SNF Provider: Inocencio Homes D Emergency Contacts: Extended Emergency Contact Information Primary Emergency Contact: [facility] Address: [address]          [place], [place] [address] Johnnette Litter of [place] Phone: [phone] Work Phone: [phone] Mobile Phone: [phone] Relation: Son Secondary Emergency Contact: [place], [place] [address] Johnnette Litter of [place] Mobile Phone: [phone] Relation: Daughter    Allergies: Review of patient's allergies indicates no known allergies.  No chief complaint on file.   HPI: Patient is 63 y.o. male who has asked me to see him because he wants antibiotics for his sore throat.  Past Medical History  Diagnosis Date  . Chronic low back pain   . 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 Chronic neck pain   . OSA (obstructive sleep apnea)     CPAP @ bedtime  . Allergy   . Unspecified essential hypertension     Resistant, 2D Echo - EF-55-60  . Polycythemia   . CAD (coronary artery disease)     Vessel type unspecified  . PVD (peripheral vascular disease)   . DDD (degenerative disc disease), cervical     Past Surgical History  Procedure Laterality Date  . Lower extrmity doppler      [place] 2008; no evidence for PAD  . US echocardiography  11/2008    Normal valves, mild LAE  . Left knee replacement  10/2006  . Cervical spine surgery  02/2008  . Joint replacement    . Spine surgery    . 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 Left heart catheterization with coronary angiogram N/A 08/08/2013    Procedure: LEFT HEART CATHETERIZATION WITH CORONARY ANGIOGRAM;  Surgeon: Peter M Martinique, MD;  Location[facility] C[facility] LAB;   Service: Cardiovascular;  Laterality: N/A;  . Pericardial tap N/A 08/10/2013    Procedure: PERICARDIAL TAP;  Surgeon: Burnell Blanks, MD;  Location: [facility] CATH LAB;  Service: Cardiovascular;  Laterality: N/A;      Medication List       This list is accurate as of: 03/12/14  9:12 PM.  Always use your most recent med list.               acetaminophen 500 MG tablet  Commonly known as:  TYLENOL  Take 500 mg by mouth 2 (two) times daily.     anagrelide 0.5 MG capsule  Commonly known as:  AGRYLIN  Take 1 capsule (0.5 mg total) by mouth 2 (two) times daily.     aspirin 325 MG tablet  Take 1 tablet (325 mg total) by mouth daily.     atorvastatin 40 MG tablet  Commonly known as:  LIPITOR  Take 40 mg by mouth daily.     benzonatate 100 MG capsule  Commonly known as:  TESSALON  Take 100 mg by mouth 3 (three) times daily as needed for cough.     carvedilol 3.125 MG tablet  Commonly known as:  COREG  Take 1 tablet (3.125 mg total) by mouth 2 (two) times daily with a meal.     chlorhexidine 0.12 % solution  Commonly known as:  PERIDEX  Use as directed  15 mLs in the mouth or throat daily.     clopidogrel 75 MG tablet  Commonly known as:  PLAVIX  Take 75 mg by mouth daily at 6 PM.     colchicine 0.6 MG tablet  Take 0.6 mg by mouth daily at 6 PM.     docusate sodium 100 MG capsule  Commonly known as:  COLACE  Take 100 mg by mouth 2 (two) times daily.     feeding supplement (ENSURE COMPLETE) Liqd  Take 237 mLs by mouth 2 (two) times daily between meals.     feeding supplement (PRO-STAT SUGAR FREE 64) Liqd  Place 30 mLs into feeding tube daily as needed (Pt wants to try).     folic acid 1 MG tablet  Commonly known as:  FOLVITE  Take 1 mg by mouth daily as needed (indigestion).     furosemide 40 MG tablet  Commonly known as:  LASIX  Take 40 mg by mouth.     HYDROcodone-acetaminophen 7.5-325 MG per tablet  Commonly known as:  NORCO  Take 1 tablet by mouth every 4  (four) hours as needed for moderate pain.     lactobacillus acidophilus Tabs tablet  Take 2 tablets by mouth 2 (two) times daily.     multivitamin with minerals tablet  Take 1 tablet by mouth daily.     mupirocin ointment 2 %  Commonly known as:  BACTROBAN  Place 1 application into the nose 2 (two) times daily.     nitroGLYCERIN 0.4 MG SL tablet  Commonly known as:  NITROSTAT  Place 0.4 mg under the tongue every 5 (five) minutes as needed for chest pain.     pregabalin 50 MG capsule  Commonly known as:  LYRICA  Take 50 mg by mouth 3 (three) times daily.     ROZEREM 8 MG tablet  Generic drug:  ramelteon  Take 8 mg by mouth at bedtime.     SANTYL ointment  Generic drug:  collagenase  Apply 1 application topically daily as needed (on buttocks).     senna-docusate 8.6-50 MG per tablet  Commonly known as:  Senokot-S  Take 1 tablet by mouth at bedtime.     thiamine 100 MG tablet  Take 1 tablet (100 mg total) by mouth daily.        No orders of the defined types were placed in this encounter.    Immunization History  Administered Date(s) Administered  . Influenza Whole 02/13/2008, 01/28/2010  . Influenza,inj,Quad PF,36+ Mos 12/20/2012  . Td 03/17/2007    History  Substance Use Topics  . Smoking status: Former Smoker -- 0 years    Types: Cigarettes, Pipe    Quit date: 03/15/1968  . Smokeless tobacco: Former Systems developer     Comment: Pt quit Cigarettes 1970's- and cigars & chew 1990  . Alcohol Use: 3.6 oz/week    6 Cans of beer per week    Review of Systems  DATA OBTAINED: from patient, nurse GENERAL:  no fevers, +fatigue, appetite changes SKIN: No itching, rash HEENT: ST RESPIRATORY: + cough but can't get anything up,no wheezing, no SOB CARDIAC: No chest pain, palpitations, lower extremity edema  GI: No abdominal pain, No N/V/D or constipation, No heartburn or reflux  GU: No dysuria, frequency or urgency, or incontinence  MUSCULOSKELETAL: No unrelieved bone/joint  pain NEUROLOGIC: No headache, dizziness  PSYCHIATRIC: No overt anxiety or sadness  Filed Vitals:   03/12/14 2111  BP: 139/81  Pulse: 78  Temp: 97  F (36.1 C)  Resp: 20    Physical Exam  GENERAL APPEARANCE: Alert, conversant, No acute distress, obese WM  SKIN: No diaphoresis rash, HEENT: Unremarkable, Op- nl RESPIRATORY: Breathing is even, unlabored. Lung sounds are mild rhonchi diffusely  With exp rhonchi, no rales or wheeze CARDIOVASCULAR: Heart RRR no murmurs, rubs or gallops. + peripheral edema  GASTROINTESTINAL: Abdomen is soft, non-tender, not distended w/ normal bowel sounds.  GENITOURINARY: Bladder non tender, not distended  MUSCULOSKELETAL: No abnormal joints or musculature NEUROLOGIC: Cranial nerves 2-12 grossly intact. Moves all extremities PSYCHIATRIC: Mood and affect appropriate to situation, no behavioral issues  Patient Active Problem List   Diagnosis Date Noted  . Wound of lower extremity 02/26/2014  . Acute renal failure 02/13/2014  . Cellulitis of groin 02/08/2014  . DNR (do not resuscitate) 11/27/2013  . Right heart failure 11/21/2013  . Cardiomyopathy, ischemic 11/17/2013  . Septic shock 11/15/2013  . C. difficile colitis 11/15/2013  . 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 Chronic systolic heart failure 17/40/8144  . ST elevation myocardial infarction (STEMI) in recovery phase 08/07/2013  . Peripheral vascular disease 12/30/2009  . POLYCYTHEMIA 10/30/2008  . Obstructive sleep apnea 10/30/2008  . Essential hypertension 10/30/2008  . Coronary atherosclerosis 10/30/2008  . DEGENERATIVE DISC DISEASE 08/14/2007    CBC    Component Value Date/Time   WBC 16.8* 02/15/2014 0516   WBC 12.9* 04/04/2013 1125   RBC 4.99 02/15/2014 0516   RBC 5.23 04/04/2013 1125   HGB 12.6* 02/15/2014 0516   HGB 15.1 04/04/2013 1125   HCT 42.7 02/15/2014 0516   HCT 46.9 04/04/2013 1125   PLT 519* 02/15/2014 0516   PLT 459* 04/04/2013 1125   MCV 85.6 02/15/2014 0516   MCV 89.7  04/04/2013 1125   LYMPHSABS 1.0 02/09/2014 0440   LYMPHSABS 1.5 04/04/2013 1125   MONOABS 0.4 02/09/2014 0440   MONOABS 0.2 04/04/2013 1125   EOSABS 0.9* 02/09/2014 0440   EOSABS 0.8* 04/04/2013 1125   BASOSABS 0.0 02/09/2014 0440   BASOSABS 0.0 04/04/2013 1125    CMP     Component Value Date/Time   NA 142 02/15/2014 0516   NA 136 03/02/2013 1055   K 4.8 02/15/2014 0516   K 4.2 03/02/2013 1055   CL 106 02/15/2014 0516   CL 108* 08/04/2012 1302   CO2 24 02/15/2014 0516   CO2 22 03/02/2013 1055   GLUCOSE 110* 02/15/2014 0516   GLUCOSE 111 03/02/2013 1055   GLUCOSE 164* 08/04/2012 1302   BUN 39* 02/15/2014 0516   BUN 13.8 03/02/2013 1055   CREATININE 1.49* 02/15/2014 0516   CREATININE 0.7 03/02/2013 1055   CALCIUM 9.5 02/15/2014 0516   CALCIUM 9.7 03/02/2013 1055   PROT 6.1 02/09/2014 0440   PROT 6.9 03/02/2013 1055   ALBUMIN 2.9* 02/09/2014 0440   ALBUMIN 3.1* 03/02/2013 1055   AST 12 02/09/2014 0440   AST 12 03/02/2013 1055   ALT 8 02/09/2014 0440   ALT 13 03/02/2013 1055   ALKPHOS 144* 02/09/2014 0440   ALKPHOS 134 03/02/2013 1055   BILITOT 1.0 02/09/2014 0440   BILITOT 0.45 03/02/2013 1055   GFRNONAA 48* 02/15/2014 0516   GFRAA 55* 02/15/2014 0516    Assessment and Plan  COUGH - pt is absolutely convinced he has PNA;  atrovent nebs to mobilize secretions, guafinesen for same, pt has tessalon perles already;CXR; plan treat with abx only for positive CXR or major progression of sx  Hennie Duos, MD

## 2014-03-16 DIAGNOSIS — M6281 Muscle weakness (generalized): Secondary | ICD-10-CM | POA: Diagnosis not present

## 2014-03-17 DIAGNOSIS — M6281 Muscle weakness (generalized): Secondary | ICD-10-CM | POA: Diagnosis not present

## 2014-03-18 DIAGNOSIS — M6281 Muscle weakness (generalized): Secondary | ICD-10-CM | POA: Diagnosis not present

## 2014-03-19 ENCOUNTER — Inpatient Hospital Stay (HOSPITAL_COMMUNITY)
Admission: EM | Admit: 2014-03-19 | Discharge: 2014-04-15 | DRG: 177 | Disposition: E | Payer: Medicare Other | Attending: Internal Medicine | Admitting: Internal Medicine

## 2014-03-19 ENCOUNTER — Emergency Department (HOSPITAL_COMMUNITY): Payer: Medicare Other

## 2014-03-19 ENCOUNTER — Encounter (HOSPITAL_COMMUNITY): Payer: Self-pay | Admitting: Emergency Medicine

## 2014-03-19 ENCOUNTER — Inpatient Hospital Stay (HOSPITAL_COMMUNITY): Payer: Medicare Other

## 2014-03-19 ENCOUNTER — Non-Acute Institutional Stay (SKILLED_NURSING_FACILITY): Payer: Medicare Other | Admitting: Internal Medicine

## 2014-03-19 DIAGNOSIS — G8929 Other chronic pain: Secondary | ICD-10-CM | POA: Diagnosis present

## 2014-03-19 DIAGNOSIS — M6289 Other specified disorders of muscle: Secondary | ICD-10-CM

## 2014-03-19 DIAGNOSIS — I517 Cardiomegaly: Secondary | ICD-10-CM | POA: Diagnosis not present

## 2014-03-19 DIAGNOSIS — C946 Myelodysplastic disease, not classified: Secondary | ICD-10-CM | POA: Diagnosis not present

## 2014-03-19 DIAGNOSIS — Z79899 Other long term (current) drug therapy: Secondary | ICD-10-CM | POA: Diagnosis not present

## 2014-03-19 DIAGNOSIS — G4733 Obstructive sleep apnea (adult) (pediatric): Secondary | ICD-10-CM | POA: Diagnosis not present

## 2014-03-19 DIAGNOSIS — Z993 Dependence on wheelchair: Secondary | ICD-10-CM | POA: Diagnosis not present

## 2014-03-19 DIAGNOSIS — Z79891 Long term (current) use of opiate analgesic: Secondary | ICD-10-CM

## 2014-03-19 DIAGNOSIS — I2510 Atherosclerotic heart disease of native coronary artery without angina pectoris: Secondary | ICD-10-CM | POA: Diagnosis not present

## 2014-03-19 DIAGNOSIS — J80 Acute respiratory distress syndrome: Secondary | ICD-10-CM | POA: Diagnosis not present

## 2014-03-19 DIAGNOSIS — Z96652 Presence of left artificial knee joint: Secondary | ICD-10-CM | POA: Diagnosis present

## 2014-03-19 DIAGNOSIS — G629 Polyneuropathy, unspecified: Secondary | ICD-10-CM | POA: Diagnosis not present

## 2014-03-19 DIAGNOSIS — R1310 Dysphagia, unspecified: Secondary | ICD-10-CM

## 2014-03-19 DIAGNOSIS — R609 Edema, unspecified: Secondary | ICD-10-CM

## 2014-03-19 DIAGNOSIS — Z7982 Long term (current) use of aspirin: Secondary | ICD-10-CM

## 2014-03-19 DIAGNOSIS — I10 Essential (primary) hypertension: Secondary | ICD-10-CM | POA: Diagnosis present

## 2014-03-19 DIAGNOSIS — R400 Somnolence: Secondary | ICD-10-CM | POA: Diagnosis not present

## 2014-03-19 DIAGNOSIS — M5030 Other cervical disc degeneration, unspecified cervical region: Secondary | ICD-10-CM | POA: Diagnosis present

## 2014-03-19 DIAGNOSIS — Z87891 Personal history of nicotine dependence: Secondary | ICD-10-CM

## 2014-03-19 DIAGNOSIS — J9600 Acute respiratory failure, unspecified whether with hypoxia or hypercapnia: Secondary | ICD-10-CM | POA: Diagnosis present

## 2014-03-19 DIAGNOSIS — J90 Pleural effusion, not elsewhere classified: Secondary | ICD-10-CM | POA: Diagnosis not present

## 2014-03-19 DIAGNOSIS — G1229 Other motor neuron disease: Secondary | ICD-10-CM

## 2014-03-19 DIAGNOSIS — Z4682 Encounter for fitting and adjustment of non-vascular catheter: Secondary | ICD-10-CM | POA: Diagnosis not present

## 2014-03-19 DIAGNOSIS — Z8249 Family history of ischemic heart disease and other diseases of the circulatory system: Secondary | ICD-10-CM | POA: Diagnosis not present

## 2014-03-19 DIAGNOSIS — R531 Weakness: Secondary | ICD-10-CM

## 2014-03-19 DIAGNOSIS — I252 Old myocardial infarction: Secondary | ICD-10-CM | POA: Diagnosis not present

## 2014-03-19 DIAGNOSIS — Z7902 Long term (current) use of antithrombotics/antiplatelets: Secondary | ICD-10-CM

## 2014-03-19 DIAGNOSIS — R532 Functional quadriplegia: Secondary | ICD-10-CM | POA: Diagnosis present

## 2014-03-19 DIAGNOSIS — D45 Polycythemia vera: Secondary | ICD-10-CM | POA: Diagnosis present

## 2014-03-19 DIAGNOSIS — R0602 Shortness of breath: Secondary | ICD-10-CM

## 2014-03-19 DIAGNOSIS — J690 Pneumonitis due to inhalation of food and vomit: Principal | ICD-10-CM | POA: Diagnosis present

## 2014-03-19 DIAGNOSIS — I5022 Chronic systolic (congestive) heart failure: Secondary | ICD-10-CM | POA: Diagnosis not present

## 2014-03-19 DIAGNOSIS — Z515 Encounter for palliative care: Secondary | ICD-10-CM

## 2014-03-19 DIAGNOSIS — M6281 Muscle weakness (generalized): Secondary | ICD-10-CM | POA: Diagnosis not present

## 2014-03-19 DIAGNOSIS — R140 Abdominal distension (gaseous): Secondary | ICD-10-CM | POA: Diagnosis not present

## 2014-03-19 DIAGNOSIS — I739 Peripheral vascular disease, unspecified: Secondary | ICD-10-CM | POA: Diagnosis not present

## 2014-03-19 DIAGNOSIS — I259 Chronic ischemic heart disease, unspecified: Secondary | ICD-10-CM | POA: Diagnosis not present

## 2014-03-19 LAB — BLOOD GAS, ARTERIAL
Acid-base deficit: 0.4 mmol/L (ref 0.0–2.0)
Bicarbonate: 27.5 mEq/L — ABNORMAL HIGH (ref 20.0–24.0)
Drawn by: 42624
O2 Content: 3 L/min
O2 SAT: 89.7 %
PATIENT TEMPERATURE: 98.6
TCO2: 25.1 mmol/L (ref 0–100)
pCO2 arterial: 61.7 mmHg (ref 35.0–45.0)
pH, Arterial: 7.271 — ABNORMAL LOW (ref 7.350–7.450)
pO2, Arterial: 64.1 mmHg — ABNORMAL LOW (ref 80.0–100.0)

## 2014-03-19 LAB — I-STAT TROPONIN, ED: Troponin i, poc: 0.01 ng/mL (ref 0.00–0.08)

## 2014-03-19 LAB — BASIC METABOLIC PANEL
Anion gap: 9 (ref 5–15)
BUN: 52 mg/dL — ABNORMAL HIGH (ref 6–23)
CALCIUM: 9.3 mg/dL (ref 8.4–10.5)
CO2: 27 mmol/L (ref 19–32)
CREATININE: 1.52 mg/dL — AB (ref 0.50–1.35)
Chloride: 104 mEq/L (ref 96–112)
GFR calc Af Amer: 54 mL/min — ABNORMAL LOW (ref >90)
GFR, EST NON AFRICAN AMERICAN: 47 mL/min — AB (ref >90)
Glucose, Bld: 73 mg/dL (ref 70–99)
Potassium: 4.3 mmol/L (ref 3.5–5.1)
SODIUM: 140 mmol/L (ref 135–145)

## 2014-03-19 LAB — HEPATIC FUNCTION PANEL
ALT: 12 U/L (ref 0–53)
AST: 25 U/L (ref 0–37)
Albumin: 3.6 g/dL (ref 3.5–5.2)
Alkaline Phosphatase: 145 U/L — ABNORMAL HIGH (ref 39–117)
BILIRUBIN DIRECT: 0.2 mg/dL (ref 0.0–0.3)
BILIRUBIN TOTAL: 1.2 mg/dL (ref 0.3–1.2)
Indirect Bilirubin: 1 mg/dL — ABNORMAL HIGH (ref 0.3–0.9)
Total Protein: 6.5 g/dL (ref 6.0–8.3)

## 2014-03-19 LAB — URINALYSIS, ROUTINE W REFLEX MICROSCOPIC
BILIRUBIN URINE: NEGATIVE
Glucose, UA: NEGATIVE mg/dL
KETONES UR: NEGATIVE mg/dL
Nitrite: POSITIVE — AB
PH: 5.5 (ref 5.0–8.0)
PROTEIN: 30 mg/dL — AB
Specific Gravity, Urine: 1.016 (ref 1.005–1.030)
Urobilinogen, UA: 1 mg/dL (ref 0.0–1.0)

## 2014-03-19 LAB — CBC
HCT: 49.1 % (ref 39.0–52.0)
Hemoglobin: 14.2 g/dL (ref 13.0–17.0)
MCH: 23.4 pg — AB (ref 26.0–34.0)
MCHC: 28.9 g/dL — ABNORMAL LOW (ref 30.0–36.0)
MCV: 80.8 fL (ref 78.0–100.0)
Platelets: 711 10*3/uL — ABNORMAL HIGH (ref 150–400)
RBC: 6.08 MIL/uL — ABNORMAL HIGH (ref 4.22–5.81)
RDW: 21.4 % — ABNORMAL HIGH (ref 11.5–15.5)
WBC: 21.1 10*3/uL — ABNORMAL HIGH (ref 4.0–10.5)

## 2014-03-19 LAB — URINE MICROSCOPIC-ADD ON

## 2014-03-19 LAB — I-STAT CHEM 8, ED
BUN: 60 mg/dL — ABNORMAL HIGH (ref 6–23)
CALCIUM ION: 1.19 mmol/L (ref 1.13–1.30)
Chloride: 103 mEq/L (ref 96–112)
Creatinine, Ser: 1.7 mg/dL — ABNORMAL HIGH (ref 0.50–1.35)
GLUCOSE: 88 mg/dL (ref 70–99)
HEMATOCRIT: 53 % — AB (ref 39.0–52.0)
HEMOGLOBIN: 18 g/dL — AB (ref 13.0–17.0)
Potassium: 4.3 mmol/L (ref 3.5–5.1)
Sodium: 143 mmol/L (ref 135–145)
TCO2: 28 mmol/L (ref 0–100)

## 2014-03-19 LAB — I-STAT CG4 LACTIC ACID, ED: LACTIC ACID, VENOUS: 1.9 mmol/L (ref 0.5–2.2)

## 2014-03-19 LAB — BRAIN NATRIURETIC PEPTIDE: B Natriuretic Peptide: 682.8 pg/mL — ABNORMAL HIGH (ref 0.0–100.0)

## 2014-03-19 MED ORDER — FAMOTIDINE IN NACL 20-0.9 MG/50ML-% IV SOLN
20.0000 mg | Freq: Two times a day (BID) | INTRAVENOUS | Status: DC
  Administered 2014-03-19: 20 mg via INTRAVENOUS
  Filled 2014-03-19: qty 50

## 2014-03-19 MED ORDER — SODIUM CHLORIDE 0.9 % IV SOLN: INTRAVENOUS | Status: DC

## 2014-03-19 MED ORDER — RISAQUAD PO CAPS
2.0000 | ORAL_CAPSULE | Freq: Every day | ORAL | Status: DC
  Filled 2014-03-19 (×2): qty 2

## 2014-03-19 MED ORDER — SODIUM CHLORIDE 0.9 % IV SOLN: 250.0000 mL | INTRAVENOUS | Status: DC | PRN

## 2014-03-19 MED ORDER — HEPARIN SODIUM (PORCINE) 5000 UNIT/ML IJ SOLN
5000.0000 [IU] | Freq: Three times a day (TID) | INTRAMUSCULAR | Status: DC
  Administered 2014-03-19: 5000 [IU] via SUBCUTANEOUS
  Filled 2014-03-19 (×3): qty 1

## 2014-03-19 MED ORDER — PIPERACILLIN-TAZOBACTAM 3.375 G IVPB 30 MIN
3.3750 g | INTRAVENOUS | Status: DC
Start: 2014-03-19 — End: 2014-03-20

## 2014-03-19 MED ORDER — PIPERACILLIN-TAZOBACTAM 3.375 G IVPB
3.3750 g | Freq: Three times a day (TID) | INTRAVENOUS | Status: DC
  Administered 2014-03-20: 3.375 g via INTRAVENOUS

## 2014-03-19 MED ORDER — ASPIRIN 325 MG PO TABS: 325.0000 mg | ORAL_TABLET | Freq: Every day | ORAL | Status: DC

## 2014-03-19 MED ORDER — SODIUM CHLORIDE 0.9 % IV SOLN
INTRAVENOUS | Status: DC
  Administered 2014-03-19: 16:00:00 via INTRAVENOUS

## 2014-03-19 NOTE — Progress Notes (Signed)
ANTIBIOTIC CONSULT NOTE - INITIAL  Pharmacy Consult for Zosyn Indication: Aspiration pneumonia  No Known Allergies  Patient Measurements:   Recent weight ~110kg on 02/15/14  Vital Signs: Temp: 97.9 F (36.6 C) (01/05 1507) Temp Source: Oral (01/05 1507) BP: 121/77 mmHg (01/05 1530) Pulse Rate: 97 (01/05 1530) Intake/Output from previous day:   Intake/Output from this shift:    Labs:  Recent Labs  04/09/2014 1510 04/09/2014 1527  WBC 21.1*  --   HGB 14.2 18.0*  PLT 711*  --   CREATININE 1.52* 1.70*   CrCl cannot be calculated (Unknown ideal weight.). No results for input(s): VANCOTROUGH, VANCOPEAK, VANCORANDOM, GENTTROUGH, GENTPEAK, GENTRANDOM, TOBRATROUGH, TOBRAPEAK, TOBRARND, AMIKACINPEAK, AMIKACINTROU, AMIKACIN in the last 72 hours.   Microbiology: No results found for this or any previous visit (from the past 720 hour(s)).  Medical History: Past Medical History  Diagnosis Date  . Chronic low back pain   . 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 Chronic neck pain   . OSA (obstructive sleep apnea)     CPAP @ bedtime  . Allergy   . Unspecified essential hypertension     Resistant, 2D Echo - EF-55-60  . Polycythemia   . CAD (coronary artery disease)     Vessel type unspecified  . PVD (peripheral vascular disease)   . DDD (degenerative disc disease), cervical     Medications:  Scheduled:   Infusions:  . sodium chloride 125 mL/hr at 03/27/2014 1545  . piperacillin-tazobactam    . [START ON March 31, 2014] piperacillin-tazobactam (ZOSYN)  IV     PRN:   Assessment: 64 y.o. [facility] brought to [facility] for evaluation of cough x2 weeks, increasing lethargy x1 week, and failed swallow screen today. History supplied by his son. Patient states he's been increasingly more lethargic and unable to carry out his limited ADLs.  Pharmacy is consulted to dose Zosyn for presumed aspiration pneumonia.  Tmax:  Afebrile WBC: 21.1 Renal: SCr 1.7 (range 1.5-2.0 during previous admit), CrCl 44 ml/min (normalized)  Micro: 1/5 blood x 2: sent 1/5 urine: sent 1/5 leg wound: sent  Goal of Therapy:  Zosyn dose appropriate for renal function  Plan:  Zosyn 3.375gm IV q8h (4hr extended infusions) Follow up renal function & cultures, clinical course  Peggyann Juba, PharmD, BCPS Pager: [phone] 04/10/2014,4:52 PM

## 2014-03-19 NOTE — ED Notes (Addendum)
Pt currently in DG at present time. Will draw labs and obtain vital signs with pt return.

## 2014-03-19 NOTE — Progress Notes (Signed)
MRN: [id number] Name: Fernando Lane  Sex: male Age: 64 y.o. DOB: August 17, 1950  [place] #: [facility] Facility/Room: 424 Level Of Care: SNF Provider: Inocencio Homes D Emergency Contacts: Extended Emergency Contact Information Primary Emergency Contact: [facility] Address: [address]          [place], [place] [address] Johnnette Litter of [place] Phone: [phone] Work Phone: [phone] Mobile Phone: [phone] Relation: Son Secondary Emergency Contact: Duanne Guess, [place] [address] Johnnette Litter of [place] Mobile Phone: [phone] Relation: Daughter  Code Status: FULL  Allergies: Review of patient's allergies indicates no known allergies.  Chief Complaint  Patient presents with  . Acute Visit    HPI: Patient is 64 y.o. male who nursing called me to see pt for a change in status. Reported pt is weak today.  Past Medical History  Diagnosis Date  . Chronic low back pain   . 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 Chronic neck pain   . OSA (obstructive sleep apnea)     CPAP @ bedtime  . Allergy   . Unspecified essential hypertension     Resistant, 2D Echo - EF-55-60  . Polycythemia   . CAD (coronary artery disease)     Vessel type unspecified  . PVD (peripheral vascular disease)   . DDD (degenerative disc disease), cervical     Past Surgical History  Procedure Laterality Date  . Lower extrmity doppler      [place] 2008; no evidence for PAD  . US echocardiography  11/2008    Normal valves, mild LAE  . Left knee replacement  10/2006  . Cervical spine surgery  02/2008  . Joint replacement    . Spine surgery    . Amputation Right 12/03/2013    Procedure: AMPUTATION RIGHT FOURTH TOE;  Surgeon: Conrad [place], MD;  Location: [place];  Service: Vascular;  Laterality: Right;  . Left heart catheterization with coronary angiogram N/A 08/08/2013    Procedure: LEFT HEART CATHETERIZATION WITH CORONARY ANGIOGRAM;   Surgeon: Peter M Martinique, MD;  Location: [facility] CATH LAB;  Service: Cardiovascular;  Laterality: N/A;  . Pericardial tap N/A 08/10/2013    Procedure: PERICARDIAL TAP;  Surgeon: Burnell Blanks, MD;  Location: [facility] CATH LAB;  Service: Cardiovascular;  Laterality: N/A;      Medication List       This list is accurate as of: 04/06/2014  2:21 PM.  Always use your most recent med list.               acetaminophen 500 MG tablet  Commonly known as:  TYLENOL  Take 500 mg by mouth 2 (two) times daily.     anagrelide 0.5 MG capsule  Commonly known as:  AGRYLIN  Take 1 capsule (0.5 mg total) by mouth 2 (two) times daily.     aspirin 325 MG tablet  Take 1 tablet (325 mg total) by mouth daily.     atorvastatin 40 MG tablet  Commonly known as:  LIPITOR  Take 40 mg by mouth daily.     benzonatate 100 MG capsule  Commonly known as:  TESSALON  Take 100 mg by mouth 3 (three) times daily as needed for cough.     carvedilol 3.125 MG tablet  Commonly known as:  COREG  Take 1 tablet (3.125 mg total) by mouth 2 (two) times daily with a meal.     chlorhexidine 0.12 % solution  Commonly known as:  PERIDEX  Use as directed 15 mLs in the mouth or throat daily.     clopidogrel 75 MG tablet  Commonly known as:  PLAVIX  Take 75 mg by mouth daily at 6 PM.     colchicine 0.6 MG tablet  Take 0.6 mg by mouth daily at 6 PM.     docusate sodium 100 MG capsule  Commonly known as:  COLACE  Take 100 mg by mouth 2 (two) times daily.     feeding supplement (ENSURE COMPLETE) Liqd  Take 237 mLs by mouth 2 (two) times daily between meals.     feeding supplement (PRO-STAT SUGAR FREE 64) Liqd  Place 30 mLs into feeding tube daily as needed (Pt wants to try).     folic acid 1 MG tablet  Commonly known as:  FOLVITE  Take 1 mg by mouth daily as needed (indigestion).     furosemide 40 MG tablet  Commonly known as:  LASIX  Take 40 mg by mouth.     guaiFENesin 600 MG 12 hr tablet  Commonly known as:   MUCINEX  Take 600 mg by mouth 2 (two) times daily. For 10 days     HYDROcodone-acetaminophen 7.5-325 MG per tablet  Commonly known as:  NORCO  Take 1 tablet by mouth every 4 (four) hours as needed for moderate pain.     ipratropium 0.02 % nebulizer solution  Commonly known as:  ATROVENT  Take 0.5 mg by nebulization 3 (three) times daily. For 7 days     lactobacillus acidophilus Tabs tablet  Take 2 tablets by mouth 2 (two) times daily.     multivitamin with minerals tablet  Take 1 tablet by mouth daily.     mupirocin ointment 2 %  Commonly known as:  BACTROBAN  Place 1 application into the nose 2 (two) times daily.     nitroGLYCERIN 0.4 MG SL tablet  Commonly known as:  NITROSTAT  Place 0.4 mg under the tongue every 5 (five) minutes as needed for chest pain.     pregabalin 50 MG capsule  Commonly known as:  LYRICA  Take 50 mg by mouth 3 (three) times daily.     ROZEREM 8 MG tablet  Generic drug:  ramelteon  Take 8 mg by mouth at bedtime.     SANTYL ointment  Generic drug:  collagenase  Apply 1 application topically daily as needed (on right lower leg anf foot). When changing his dressing every other day     senna-docusate 8.6-50 MG per tablet  Commonly known as:  Senokot-S  Take 1 tablet by mouth at bedtime.     thiamine 100 MG tablet  Take 1 tablet (100 mg total) by mouth daily.        No orders of the defined types were placed in this encounter.    Immunization History  Administered Date(s) Administered  . Influenza Whole 02/13/2008, 01/28/2010  . Influenza,inj,Quad PF,36+ Mos 12/20/2012  . Td 03/17/2007    History  Substance Use Topics  . Smoking status: Former Smoker -- 0 years    Types: Cigarettes, Pipe    Quit date: 03/15/1968  . Smokeless tobacco: Former Systems developer     Comment: Pt quit Cigarettes 1970's- and cigars & chew 1990  . Alcohol Use: 3.6 oz/week    6 Cans of beer per week     Comment: quit approx 10 months ago - 04/04/2014    Review of  Systems  DATA OBTAINED: from  patient, nurse,  family member, ST,PT GENERAL:  no fevers SKIN: R hip wound hurts HEENT: No complaint RESPIRATORY: some cough, no wheezing,no SOB CARDIAC: No chest pain, palpitations, lower extremity edema  GI: No abdominal pain, No N/V/D or constipation, No heartburn or reflux  GU: No dysuria, frequency or urgency, or incontinence  MUSCULOSKELETAL: No unrelieved bone/joint pain NEUROLOGIC: L side weakness ,new today from last week;couldn't hold wash cloth today; yesterday could swallow pills, this morning unable to swallow meds with water;ST evaluated pt and he failed a swallowing test this morning;no episodes of choking or difficulty swallowing reported yesterday or last week PSYCHIATRIC: "pt has given up" ,is depressed per staff  Filed Vitals:   03/22/2014 1359  BP: 139/76  Pulse: 76  Temp: 97.5 F (36.4 C)  Resp: 18    Physical Exam  GENERAL APPEARANCE: Alert, conversant, speaks softly and slowly but is appropriate, even jokes some,asks for water to drink,generalized weakness SKIN: No diaphoresis  HEENT: Unremarkable RESPIRATORY: Breathing is even, unlabored.Occ cough Lung sounds are mild rhonchi  CARDIOVASCULAR: Heart RRR no murmurs, rubs or gallops. No peripheral edema  GASTROINTESTINAL: Abdomen is soft, non-tender, not distended w/ normal bowel sounds.  GENITOURINARY: Bladder non tender, not distended  MUSCULOSKELETAL: No abnormal joints or musculature NEUROLOGIC: alert ,oriented, appropriate, voice quiet but clear; baseline R side weakness with new LUE 2/5, LLE 1/5, babinski on L +/- with decreased reflex, PSYCHIATRIC: more quiet, slow, no behavioral issues  Patient Active Problem List   Diagnosis Date Noted  . Left-sided weakness 2014/04/12  . Dysphagia 04/05/2014  . Aspiration pneumonia 03/18/2014  . Bulbar weakness   . Wound of lower extremity 02/26/2014  . Acute renal failure 02/13/2014  . Cellulitis of groin 02/08/2014  . DNR (do  not resuscitate) 11/27/2013  . Right heart failure 11/21/2013  . Cardiomyopathy, ischemic 11/17/2013  . Septic shock 11/15/2013  . C. difficile colitis 11/15/2013  . 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 Chronic systolic heart failure 25/00/3704  . ST elevation myocardial infarction (STEMI) in recovery phase 08/07/2013  . Peripheral vascular disease 12/30/2009  . POLYCYTHEMIA 10/30/2008  . Obstructive sleep apnea 10/30/2008  . Essential hypertension 10/30/2008  . Coronary atherosclerosis 10/30/2008  . DEGENERATIVE DISC DISEASE 08/14/2007    CBC    Component Value Date/Time   WBC 21.1* 04/06/2014 1510   WBC 12.9* 04/04/2013 1125   RBC 6.08* 03/25/2014 1510   RBC 5.23 04/04/2013 1125   HGB 18.0* 04/11/2014 1527   HGB 15.1 04/04/2013 1125   HCT 53.0* 03/18/2014 1527   HCT 46.9 04/04/2013 1125   PLT 711* 04/09/2014 1510   PLT 459* 04/04/2013 1125   MCV 80.8 03/30/2014 1510   MCV 89.7 04/04/2013 1125   LYMPHSABS 1.0 02/09/2014 0440   LYMPHSABS 1.5 04/04/2013 1125   MONOABS 0.4 02/09/2014 0440   MONOABS 0.2 04/04/2013 1125   EOSABS 0.9* 02/09/2014 0440   EOSABS 0.8* 04/04/2013 1125   BASOSABS 0.0 02/09/2014 0440   BASOSABS 0.0 04/04/2013 1125    CMP     Component Value Date/Time   NA 143 04/11/2014 1527   NA 136 03/02/2013 1055   K 4.3 04/14/2014 1527   K 4.2 03/02/2013 1055   CL 103 03/15/2014 1527   CL 108* 08/04/2012 1302   CO2 27 03/15/2014 1510   CO2 22 03/02/2013 1055   GLUCOSE 88 03/28/2014 1527   GLUCOSE 111 03/02/2013 1055   GLUCOSE 164* 08/04/2012 1302   BUN 60* 04/04/2014 1527   BUN 13.8 03/02/2013  1055   CREATININE 1.70* 03/21/2014 1527   CREATININE 0.7 03/02/2013 1055   CALCIUM 9.3 04/13/2014 1510   CALCIUM 9.7 03/02/2013 1055   PROT 6.5 03/16/2014 1519   PROT 6.9 03/02/2013 1055   ALBUMIN 3.6 04/07/2014 1519   ALBUMIN 3.1* 03/02/2013 1055   AST 25 03/29/2014 1519   AST 12 03/02/2013 1055   ALT 12 03/27/2014 1519   ALT 13 03/02/2013 1055   ALKPHOS 145*  04/01/2014 1519   ALKPHOS 134 03/02/2013 1055   BILITOT 1.2 04/12/2014 1519   BILITOT 0.45 03/02/2013 1055   GFRNONAA 47* 04/14/2014 1510   GFRAA 54* 04/11/2014 1510    Assessment and Plan  Dysphagia New onset from yesterday to today by nursing; inability to swallow pills and water noted with meds at around 9am;ST was consulted and pt completely failed swallowing test  Left-sided weakness Reported as generalized, pt couldn't roll over in bed but closer questions pt using L extremity less than prior today with usual R side weakness; exam confirm   Pt appears to have had an event onset, not clear if started late yesterday, most certainly this am c/w new onset CVA, but there is a generalized slowing of sensorium and more generalized weakness in addition to focal that is suggestive of maybe something else.Probably aspirated, and other infection, urine most likely possible in addition. Send to ED. Discussed with pt's son and daughter in room with pt.  Hennie Duos, MD

## 2014-03-19 NOTE — Progress Notes (Signed)
Bilateral lower extremity venous duplex completed:  No evidence of DVT, superficial thrombosis, or Baker's cyst.   

## 2014-03-19 NOTE — ED Notes (Addendum)
Upon entering room pt denies pain. On ascultation pt upper lobes clear and lower lobes diminished/rhoni. Pt has congested nonproductive cough.

## 2014-03-19 NOTE — ED Provider Notes (Signed)
CSN: [id number]     Arrival date & time 04/06/2014  1415 History   First MD Initiated Contact with Patient 04/13/2014 1508     Chief Complaint  Patient presents with  . Dysphagia     (Consider location/radiation/quality/duration/timing/severity/associated sxs/prior Treatment) HPI   Fernando Lane is a 64 y.o. male   sent from from [place] facility for evaluation of cough x2 wks, increasing lethargy x1 week, failed swallow screen today. History supplied by his son. Patient states he's been increasingly more lethargic and unable to carry out his limited ADLs. 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 States that he has failed swallow screen despite having thickend PO intake. He has had a cough since prior to Christmas and reports feeling very cold.  States that Pt has been on nasal canula since going to [employer] x4 weeks ago. No history of COPD. Decreased by mouth intake, last week hold normal conversation and now as he is able to speak in one-word sentences. Patient responds that he is noting pain but it is difficult To breath. Level V caveat secondary acuity of condition and difficulty speaking. Patient was in rehabilitation facility 6 months ago after MI, he was transferred to secondary rehabilitation facility after inguinal cellulitis. Severity, and over the last year.  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 Chronic low back pain   . 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 Chronic neck pain   . OSA (obstructive sleep apnea)     CPAP @ bedtime  . Allergy   . Unspecified essential hypertension     Resistant, 2D Echo - EF-55-60  . Polycythemia   . CAD (coronary artery disease)     Vessel type  unspecified  . PVD (peripheral vascular disease)   . DDD (degenerative disc disease), cervical    Past Surgical History  Procedure Laterality Date  . Lower extrmity doppler      [place] 2008; no evidence for PAD  . US echocardiography  11/2008    Normal valves, mild LAE  . Left knee replacement  10/2006  . Cervical spine surgery  02/2008  . Joint replacement    . Spine surgery    . Amputation Right 12/03/2013    Procedure: AMPUTATION RIGHT FOURTH TOE;  Surgeon: Conrad Marathon, MD;  Location: [place];  Service: Vascular;  Laterality: Right;  . Left heart catheterization with coronary angiogram N/A 08/08/2013    Procedure: LEFT HEART CATHETERIZATION WITH CORONARY ANGIOGRAM;  Surgeon: Peter M Martinique, MD;  Location: [facility] CATH LAB;  Service: Cardiovascular;  Laterality: N/A;  . Pericardial tap N/A 08/10/2013    Procedure: PERICARDIAL TAP;  Surgeon: Burnell Blanks, MD;  Location: [facility] CATH LAB;  Service: Cardiovascular;  Laterality: N/A;   Family History  Problem Relation Age of Onset  . Heart attack Father    History  Substance Use Topics  . Smoking status: Former Smoker -- 0 years    Types: Cigarettes, Pipe    Quit date: 03/15/1968  . Smokeless tobacco: Former Systems developer     Comment: Pt quit Cigarettes 1970's- and cigars & chew 1990  . Alcohol Use: 3.6 oz/week    6 Cans of beer per week  Review of Systems  Unable to perform ROS: Acuity of condition       Allergies  Review of patient's allergies indicates no known allergies.  Home Medications   Prior to Admission medications   Medication Sig Start Date End Date Taking? Authorizing Provider  acetaminophen (TYLENOL) 500 MG tablet Take 500 mg by mouth 2 (two) times daily.    Historical Provider, MD  Amino Acids-Protein Hydrolys (FEEDING SUPPLEMENT, PRO-STAT SUGAR FREE 64,) LIQD Place 30 mLs into feeding tube daily as needed (Pt wants to try). 02/15/14   Venetia Maxon Rama, MD  anagrelide (AGRYLIN) 0.5 MG capsule Take 1 capsule (0.5 mg  total) by mouth 2 (two) times daily. 01/30/14   Rowe Clack, MD  aspirin 325 MG tablet Take 1 tablet (325 mg total) by mouth daily. 12/04/13   Donne Hazel, MD  atorvastatin (LIPITOR) 40 MG tablet Take 40 mg by mouth daily.    Historical Provider, MD  benzonatate (TESSALON) 100 MG capsule Take 100 mg by mouth 3 (three) times daily as needed for cough.    Historical Provider, MD  carvedilol (COREG) 3.125 MG tablet Take 1 tablet (3.125 mg total) by mouth 2 (two) times daily with a meal. 12/04/13   Donne Hazel, MD  chlorhexidine (PERIDEX) 0.12 % solution Use as directed 15 mLs in the mouth or throat daily.    Historical Provider, MD  clopidogrel (PLAVIX) 75 MG tablet Take 75 mg by mouth daily at 6 PM.    Historical Provider, MD  colchicine 0.6 MG tablet Take 0.6 mg by mouth daily at 6 PM.    Historical Provider, MD  docusate sodium (COLACE) 100 MG capsule Take 100 mg by mouth 2 (two) times daily.    Historical Provider, MD  feeding supplement, ENSURE COMPLETE, (ENSURE COMPLETE) LIQD Take 237 mLs by mouth 2 (two) times daily between meals. 08/17/13   Geradine Girt, DO  folic acid (FOLVITE) 1 MG tablet Take 1 mg by mouth daily as needed (indigestion).     Historical Provider, MD  furosemide (LASIX) 40 MG tablet Take 40 mg by mouth.    Historical Provider, MD  HYDROcodone-acetaminophen (NORCO) 7.5-325 MG per tablet Take 1 tablet by mouth every 4 (four) hours as needed for moderate pain. 02/15/14   Venetia Maxon Rama, MD  lactobacillus acidophilus (BACID) TABS tablet Take 2 tablets by mouth 2 (two) times daily.    Historical Provider, MD  Multiple Vitamins-Minerals (MULTIVITAMIN WITH MINERALS) tablet Take 1 tablet by mouth daily.    Historical Provider, MD  mupirocin ointment (BACTROBAN) 2 % Place 1 application into the nose 2 (two) times daily. 02/15/14   Venetia Maxon Rama, MD  nitroGLYCERIN (NITROSTAT) 0.4 MG SL tablet Place 0.4 mg under the tongue every 5 (five) minutes as needed for chest pain.     Historical Provider, MD  pregabalin (LYRICA) 50 MG capsule Take 50 mg by mouth 3 (three) times daily.    Historical Provider, MD  ROZEREM 8 MG tablet Take 8 mg by mouth at bedtime.  01/18/14   Historical Provider, MD  SANTYL ointment Apply 1 application topically daily as needed (on buttocks).  10/10/13   Historical Provider, MD  senna-docusate (SENOKOT-S) 8.6-50 MG per tablet Take 1 tablet by mouth at bedtime. 12/04/13   Donne Hazel, MD  thiamine 100 MG tablet Take 1 tablet (100 mg total) by mouth daily. 08/17/13   Geradine Girt, DO   BP 121/77 mmHg  Pulse 97  Temp(Src)  97.9 F (36.6 C) (Oral)  Resp 35  SpO2 98% Physical Exam  Constitutional: He is oriented to person, place, and time. He appears well-developed and well-nourished.  Obese  HENT:  Head: Normocephalic.  Gurgling, swallowing his secretions but with difficulty.  Eyes: Conjunctivae and EOM are normal. Pupils are equal, round, and reactive to light.  Neck: Normal range of motion.  Cardiovascular: Normal rate, regular rhythm and intact distal pulses.   Pulmonary/Chest: Effort normal. No stridor. No respiratory distress. He has no wheezes.  Tachypneic in the low 30s On 3 L of oxygen via nasal cannula. Air movement at the bases and rhonchorous on the right greater than left.  Abdominal: Soft. He exhibits no distension.  Musculoskeletal: Normal range of motion.  Neurological: He is alert and oriented to person, place, and time.  Moves all extremities, upper extremities greater than lower extremity, limited neuro exam is nonfocal.  Skin:  Chronic ulcer to right LE  Nursing note and vitals reviewed.   ED Course  Procedures (including critical care time)  CRITICAL CARE Performed by: Monico Blitz   Total critical care time: 35  Critical care time was exclusive of separately billable procedures and treating other patients.  Critical care was necessary to treat or prevent imminent or life-threatening  deterioration.  Critical care was time spent personally by me on the following activities: development of treatment plan with patient and/or surrogate as well as nursing, discussions with consultants, evaluation of patient's response to treatment, examination of patient, obtaining history from patient or surrogate, ordering and performing treatments and interventions, ordering and review of laboratory studies, ordering and review of radiographic studies, pulse oximetry and re-evaluation of patient's condition.  Labs Review Labs Reviewed  BASIC METABOLIC PANEL - Abnormal; Notable for the following:    BUN 52 (*)    Creatinine, Ser 1.52 (*)    GFR calc non Af Amer 47 (*)    GFR calc Af Amer 54 (*)    All other components within normal limits  CBC - Abnormal; Notable for the following:    WBC 21.1 (*)    RBC 6.08 (*)    MCH 23.4 (*)    MCHC 28.9 (*)    RDW 21.4 (*)    Platelets 711 (*)    All other components within normal limits  BRAIN NATRIURETIC PEPTIDE - Abnormal; Notable for the following:    B Natriuretic Peptide 682.8 (*)    All other components within normal limits  URINALYSIS, ROUTINE W REFLEX MICROSCOPIC - Abnormal; Notable for the following:    Color, Urine AMBER (*)    APPearance CLOUDY (*)    Hgb urine dipstick SMALL (*)    Protein, ur 30 (*)    Nitrite POSITIVE (*)    Leukocytes, UA LARGE (*)    All other components within normal limits  HEPATIC FUNCTION PANEL - Abnormal; Notable for the following:    Alkaline Phosphatase 145 (*)    Indirect Bilirubin 1.0 (*)    All other components within normal limits  URINE MICROSCOPIC-ADD ON - Abnormal; Notable for the following:    Bacteria, UA MANY (*)    Casts HYALINE CASTS (*)    All other components within normal limits  I-STAT CHEM 8, ED - Abnormal; Notable for the following:    BUN 60 (*)    Creatinine, Ser 1.70 (*)    Hemoglobin 18.0 (*)    HCT 53.0 (*)    All other components within normal limits  CULTURE,  BLOOD  (ROUTINE X 2)  CULTURE, BLOOD (ROUTINE X 2)  URINE CULTURE  WOUND CULTURE  CULTURE, EXPECTORATED SPUTUM-ASSESSMENT  INFLUENZA PANEL BY PCR (TYPE A & B, H1N1)  CBC  CREATININE, SERUM  CBC  BASIC METABOLIC PANEL  MAGNESIUM  PHOSPHORUS  BLOOD GAS, ARTERIAL  I-STAT CG4 LACTIC ACID, ED  Randolm Idol, ED    Imaging Review Dg Chest 2 View  03/22/2014   CLINICAL DATA:  Cough, shortness of breath for 3 days  EXAM: CHEST  2 VIEW  COMPARISON:  11/20/2013  FINDINGS: Cardiomegaly again noted. There is small right pleural effusion with right basilar atelectasis or infiltrate. No pulmonary edema.  IMPRESSION: Cardiomegaly. Small right pleural effusion with right basilar posterior atelectasis or infiltrate.   Electronically Signed   By: Lahoma Crocker M.D.   On: 03/30/2014 15:09     EKG Interpretation None      MDM   Final diagnoses:  Dysphagia  Bulbar weakness  Aspiration pneumonia, unspecified aspiration pneumonia type    Filed Vitals:   03/29/2014 1430 03/29/2014 1507 03/25/2014 1515 03/15/2014 1530  BP: 112/76 117/76 123/75 121/77  Pulse: 92 97 95 97  Temp:  97.9 F (36.6 C)    TempSrc:  Oral    Resp: 19 20 29  35  SpO2: 92% 100% 100% 98%    Medications  0.9 %  sodium chloride infusion ( Intravenous New Bag/Given 04/12/2014 1545)  piperacillin-tazobactam (ZOSYN) IVPB 3.375 g (not administered)  piperacillin-tazobactam (ZOSYN) IVPB 3.375 g (not administered)  0.9 %  sodium chloride infusion (not administered)  heparin injection 5,000 Units (not administered)  0.9 %  sodium chloride infusion (not administered)  famotidine (PEPCID) IVPB 20 mg (not administered)  acidophilus (RISAQUAD) capsule 2 capsule (not administered)    JOUD INGWERSEN is a pleasant 64 y.o. male presenting with increasing weakness, lethargy, difficulty swallowing. Patient is generally debilitated, has had a cough for 2 weeks and developed difficult to swallowing today. No indication for emergent airway intervention at  this moment. Chest x-ray with possible right lower infiltrate, blood cultures are drawn and patient will be started on Zosyn for an aspiration pneumonia shared visit with the attending physician who is personally evaluated the patient. Critical care consult from Dr. Shirlean Kelly appreciated: He will admit the patient and requests MRI of head and C-spine for further evaluation of bulbar weakness, he will consult neurology on the floor. Urinalysis consistent with infection, urine culture pending. Patient has history of polycythemia I think that in combination with the dehydration is the source of his hemoconcentration  Monico Blitz, PA-C 04/13/2014 1712  Johnna Acosta, MD 03/22/14 [phone]

## 2014-03-19 NOTE — ED Notes (Signed)
Suction performed at bedside PRN-productive cough-thick white mucous

## 2014-03-19 NOTE — ED Notes (Signed)
Bed: PF79 Expected date:  Expected time:  Means of arrival:  Comments: EMS-difficulty swallowing

## 2014-03-19 NOTE — ED Notes (Signed)
Attempt to start IV and draw labs by Hanley Seamen and this RN unsuccessful. Amanda phlebotomy at bedside attempting lab draw.

## 2014-03-19 NOTE — Progress Notes (Signed)
Pt arrived to the unit from the ED via stretcher. On arrival pt is lethargic and does not follow commands. VSS on 02 @ 3l/[place], pt has multiple open sores to BLE with noted redness and swelling bilaterally. Pt also has a stage 2 present on arrival to his sacrum. NGT placed to left nare, will get KUB for placement check. Report given to night RN.

## 2014-03-19 NOTE — ED Notes (Signed)
Per [facility]-states he failed swallow test at nursing facility-states nursing facility noticed change in speech around 1030-oriented X4-patient answers questions appropriately-per facility, left leg is always cold, but not not left arm-nonproductive cough

## 2014-03-19 NOTE — ED Provider Notes (Signed)
The patient is a 64 year old male, he is severely debilitated in a nursing facility with the inability to move any of his arms or legs.  It does not appear that the patient has had a definite diagnosis as to the source of this severe weakness that brings him to the hospital today. He reportedly had a failed swallow screen and with rapidly progressive weakness at his facility there was concern. He has on my exam weakness of all 4 extremities, he is barely able to move his fingertips, he is having significant difficulty maintaining fluids including his own sputum, his heart and lungs sound normal, he does not have an aspiration pneumonia on x-ray, his vital sign showed tachypnea but no hypoxia tachycardia hypotension or fever.  Labs are resulting showing some renal failure with a creatinine of 1.7 and a B1 of 60, lactic acid is normal, troponin is normal.  Medical screening examination/treatment/procedure(s) were conducted as a shared visit with non-physician practitioner(s) and myself.  I personally evaluated the patient during the encounter.  Clinical Impression:   Final diagnoses:  Dysphagia  Bulbar weakness  Aspiration pneumonia, unspecified aspiration pneumonia type       Johnna Acosta, MD 03/22/14 [phone]

## 2014-03-19 NOTE — Consult Note (Addendum)
Neurology Consultation Reason for Consult: Weakness, Dysphagia Referring Physician: Barnett Hatter, D  CC: dysphagia  History is obtained from:patient, family  HPI: Fernando Lane is a 64 y.o. male with a history of polycythemia, recent MI, PVD, and longstanding neuropathy who presents with voice changes and dysphagia. It has also been noted that his arms, particularly the left has been getting weaker.   He is being treated for likely aspiration pneumonia.   He has been using a wheelchair for years, but was still able to stand and use a walker briefly has recently as November. He has had problems with dysphagia in the past and this was attributed to short term intubation.    LKW: at least two weeks ago tpa given?: no, outside of window.     ROS: A 14 point ROS was performed and is negative except as noted in the HPI.   Past Medical History  Diagnosis Date  . Chronic low back pain   . 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 Chronic neck pain   . OSA (obstructive sleep apnea)     CPAP @ bedtime  . Allergy   . Unspecified essential hypertension     Resistant, 2D Echo - [id number]  . Polycythemia   . CAD (coronary artery disease)     Vessel type unspecified  . PVD (peripheral vascular disease)   . DDD (degenerative disc disease), cervical     Family History: Father - heart attack  Social History: Tob: denies  Exam: Current vital signs: BP 121/77 mmHg  Pulse 97  Temp(Src) 97.9 F (36.6 C) (Oral)  Resp 35  SpO2 98% Vital signs in last 24 hours: Temp:  [97.5 F (36.4 C)-97.9 F (36.6 C)] 97.9 F (36.6 C) (01/05 1507) Pulse Rate:  [76-97] 97 (01/05 1530) Resp:  [18-35] 35 (01/05 1530) BP: (112-139)/(75-77) 121/77 mmHg (01/05 1530) SpO2:  [92 %-100 %] 98 % (01/05 1530)  Physical Exam  Constitutional: Appears well-developed and well-nourished.  Psych: Affect appropriate to situation Eyes: No scleral injection HENT: No OP  obstrucion Head: Normocephalic.  Cardiovascular: Normal rate and regular rhythm.  Respiratory: SOB with prominent upper respiratory sounds.  GI: Soft.  No distension. There is no tenderness.  Skin: WDI  Neuro: Mental Status: Patient is awake, alert, oriented to person, place, month, year, and situation. Patient is able to give a clear and coherent history. No signs of aphasia or neglect Cranial Nerves: II: Visual Fields are full. Pupils are equal, round, and reactive to light.   III,IV, VI: EOMI without ptosis or diploplia.  V: Facial sensation is symmetric to temperature VII: Facial movement is symmetric.  VIII: hearing is intact to voice X: Uvula appears to elevate to the left, but unclear due to copious secretions.  XI: Shoulder shrug is symmetric. XII: tongue is midline without atrophy or fasciculations.  Motor: Tone is normal. Bulk is normal. He has 2/5 strength in bilateral LE, he is able to lift his left arm barely against gravity with promiannt weakness throughout. His right arm is slightly stronger, but still 4-/5.  Sensory: Sensation is diminished to temperature on the left to the pectoral region, on the right, he has a stocking distribution numbness to above the knee.  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   Impression: 64 yo M with progressive weakness and dysphagia in the setting of previous neuropathy. Possibilities include guillian-barre syndrome with bulbar involvement superimposed on chronic neuropathy, brainstem infarct with chronic neuropathy, acute worsening of previous deficits due to infection(peeling the onion). MG is less likley but with this degree of dysarthria and difficulty with  secretions it would not be unreasonable to check. PCCM has already discussed code status and have made him DNR.   Recommendations: 1) MG panel 2 2) MRI brain and C-spine 3) If MRI is negative, would favor LP with consideration of treatment for GBS.  4) will continue to follow.    Roland Rack, MD [facility] [phone]  If 7pm- 7am, please page neurology on call as listed in [place].

## 2014-03-19 NOTE — Progress Notes (Signed)
Order noted for NIF/VC.  Pt unable to follow commands at this time.

## 2014-03-19 NOTE — H&P (Signed)
Name: Fernando Lane MRN: [id number] DOB: 19-Aug-1950    ADMISSION DATE:  04/12/2014 CONSULTATION DATE:  03/29/14  REFERRING MD :  Sabra Heck, MD  CHIEF COMPLAINT:  Bulbar dsyfxn, concern resp failure from secretions  BRIEF PATIENT DESCRIPTION: 64 yr old, months prior in wheelchair, PCV, PVD presents with weakness now arms and bulbar dysfxn  SIGNIFICANT EVENTS   STUDIES:  1/15 MRI brain>>> 1/15 MRI neck>>>  HISTORY OF PRESENT ILLNESS:  64 yr old recent MI [place], to rehab, PVD worsening weakness into wheelchair explained by neuropathy per son. Worsening slow progression secretions last 2 weeks and change in voice.  Secretion worse, poor cough, voice tone off, can move arms above gravity. Presented to [facility] ED. pcxr atx, concern asp. Recent work up aspiration dysphagia at [employer] as well. No neck pain, no headache. He reports strong cough, some SOB. Denies flu shock  PAST MEDICAL HISTORY :   has a past medical history of Chronic low back pain; Myeloproliferative disorder; Chronic neck pain; OSA (obstructive sleep apnea); Allergy; Unspecified essential hypertension; Polycythemia; CAD (coronary artery disease); PVD (peripheral vascular disease); and DDD (degenerative disc disease), cervical.  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 Prior to Admission medications   Medication Sig Start Date End Date Taking? Authorizing Provider  acetaminophen (TYLENOL) 500 MG tablet Take 500 mg by mouth 2 (two) times daily.    Historical Provider, MD  Amino Acids-Protein Hydrolys (FEEDING SUPPLEMENT, PRO-STAT SUGAR FREE 64,) LIQD Place 30 mLs into feeding tube daily as needed (Pt wants to try). 02/15/14   Venetia Maxon Rama, MD    anagrelide (AGRYLIN) 0.5 MG capsule Take 1 capsule (0.5 mg total) by mouth 2 (two) times daily. 01/30/14   Rowe Clack, MD  aspirin 325 MG tablet Take 1 tablet (325 mg total) by mouth daily. 12/04/13   Donne Hazel, MD  atorvastatin (LIPITOR) 40 MG tablet Take 40 mg by mouth daily.    Historical Provider, MD  benzonatate (TESSALON) 100 MG capsule Take 100 mg by mouth 3 (three) times daily as needed for cough.    Historical Provider, MD  carvedilol (COREG) 3.125 MG tablet Take 1 tablet (3.125 mg total) by mouth 2 (two) times daily with a meal. 12/04/13   Donne Hazel, MD  chlorhexidine (PERIDEX) 0.12 % solution Use as directed 15 mLs in the mouth or throat daily.    Historical Provider, MD  clopidogrel (PLAVIX) 75 MG tablet Take 75 mg by mouth daily at 6 PM.    Historical Provider, MD  colchicine 0.6 MG tablet Take 0.6 mg by mouth daily at 6 PM.    Historical Provider, MD  docusate sodium (COLACE) 100 MG capsule Take 100 mg by mouth 2 (two) times daily.    Historical Provider, MD  feeding supplement, ENSURE COMPLETE, (ENSURE COMPLETE) LIQD Take 237 mLs by mouth 2 (two) times daily between meals. 08/17/13   Geradine Girt, DO  folic acid (FOLVITE) 1 MG tablet Take 1 mg by mouth daily as needed (indigestion).     Historical Provider, MD  furosemide (LASIX) 40 MG tablet Take 40 mg by mouth.    Historical Provider, MD  HYDROcodone-acetaminophen (NORCO) 7.5-325 MG per tablet Take 1 tablet by mouth every 4 (four) hours as needed for moderate pain. 02/15/14   Christina  P Rama, MD  lactobacillus acidophilus (BACID) TABS tablet Take 2 tablets by mouth 2 (two) times daily.    Historical Provider, MD  Multiple Vitamins-Minerals (MULTIVITAMIN WITH MINERALS) tablet Take 1 tablet by mouth daily.    Historical Provider, MD  mupirocin ointment (BACTROBAN) 2 % Place 1 application into the nose 2 (two) times daily. 02/15/14   Venetia Maxon Rama, MD  nitroGLYCERIN (NITROSTAT) 0.4 MG SL tablet Place 0.4 mg under the  tongue every 5 (five) minutes as needed for chest pain.    Historical Provider, MD  pregabalin (LYRICA) 50 MG capsule Take 50 mg by mouth 3 (three) times daily.    Historical Provider, MD  ROZEREM 8 MG tablet Take 8 mg by mouth at bedtime.  01/18/14   Historical Provider, MD  SANTYL ointment Apply 1 application topically daily as needed (on buttocks).  10/10/13   Historical Provider, MD  senna-docusate (SENOKOT-S) 8.6-50 MG per tablet Take 1 tablet by mouth at bedtime. 12/04/13   Donne Hazel, MD  thiamine 100 MG tablet Take 1 tablet (100 mg total) by mouth daily. 08/17/13   Geradine Girt, DO   No Known Allergies  FAMILY HISTORY:  family history includes Heart attack in his father. SOCIAL HISTORY:  reports that he quit smoking about 46 years ago. His smoking use included Cigarettes and Pipe. He smoked 0.00 packs per day for 0 years. He has quit using smokeless tobacco. He reports that he drinks about 3.6 oz of alcohol per week. He reports that he does not use illicit drugs.  REVIEW OF SYSTEMS:   Constitutional: Negative for fever, chills, POS weight loss, POS malaise/fatigue and NEG diaphoresis.  HENT: Negative for hearing loss, ear pain, nosebleeds, congestion, POS sore throat, NEG neck pain, tinnitus and ear discharge.   Eyes: Negative for blurred vision, double vision, photophobia, pain, discharge and redness.  Respiratory: POS for cough  With food, hemoptysis, sputum production, POS shortness of breath, wheezing and stridor.   Cardiovascular: Negative for chest pain, palpitations, orthopnea, claudication,POS leg swelling and PND.  Gastrointestinal: Negative for heartburn, nausea, vomiting, abdominal pain, diarrhea, constipation, blood in stool and melena. DID have cdiff treated Genitourinary: Negative for dysuria, urgency, frequency, hematuria and flank pain.  Musculoskeletal: Negative for myalgias, back pain, joint pain and falls. np neck trauma Skin: Negative for itching and rash.    Neurological: Negative for dizziness, tingling, tremors, sensory change, POS speech change, POS bilat weakness, neg seizures, loss of consciousness, weakness and headaches.  Endo/Heme/Allergies: Negative for environmental allergies and polydipsia. Does not bruise/bleed easily.  SUBJECTIVE:   VITAL SIGNS: Temp:  [97.5 F (36.4 C)-97.9 F (36.6 C)] 97.9 F (36.6 C) (01/05 1507) Pulse Rate:  [76-97] 97 (01/05 1530) Resp:  [18-35] 35 (01/05 1530) BP: (112-139)/(75-77) 121/77 mmHg (01/05 1530) SpO2:  [92 %-100 %] 98 % (01/05 1530)  PHYSICAL EXAMINATION: General:  Awake, OO x 4 Neuro:  Perr, voice change noted, secretion control por, has cough, CN intact, weak 3/5 uppers, 1/5 lowers HEENT:  No pain, redness neck, jvd low Cardiovascular:  s1 s2 RRR Lungs:  ronchi Abdomen:  Soft, BS hypo, distended, nontender Musculoskeletal:  PVD noted ext below, edema 3 plus Skin:  Mild erythema lowers   Recent Labs Lab 04/11/2014 1510 04/02/2014 1527  NA 140 143  K 4.3 4.3  CL 104 103  CO2 27  --   BUN 52* 60*  CREATININE 1.52* 1.70*  GLUCOSE 73 88    Recent Labs Lab 04/07/2014 1510  03/25/2014 1527  HGB 14.2 18.0*  HCT 49.1 53.0*  WBC 21.1*  --   PLT 711*  --    Dg Chest 2 View  03/21/2014   CLINICAL DATA:  Cough, shortness of breath for 3 days  EXAM: CHEST  2 VIEW  COMPARISON:  11/20/2013  FINDINGS: Cardiomegaly again noted. There is small right pleural effusion with right basilar atelectasis or infiltrate. No pulmonary edema.  IMPRESSION: Cardiomegaly. Small right pleural effusion with right basilar posterior atelectasis or infiltrate.   Electronically Signed   By: Lahoma Crocker M.D.   On: 03/18/2014 15:09    ASSESSMENT / PLAN: 1. Aspiration PNA 2. Progressive weakness/ bulbar dysfxn 3. Dysphagia 4. Neuropathy 5. Polycythemia vera 6. CAD 7. R/o dvt  -admit SDU -I have had extensive discussions with family and pt. We discussed patients current circumstances and organ failures. We also  discussed patient's prior wishes under circumstances such as this. Family has decided to NOT perform resuscitation if arrest but to continue current medical support for now. NO wishes vent, pressors, trach, heroics but medical treatment as able - nif, vc, MRI head, neck, i called neuro -consider work up MG (doubt), r/o spinal hematoma,  (asa, mi recent, doesn't explain bulbar),r/o infarct with PV(get viscosity),  ALS?, doubt varient GB -zosyn, sputum -NTS -No BIPAP -get viscosity -NPO -slp in future if survives -ABG -dopplers  Ccm time 60 min   Lavon Paganini. Titus Mould, MD, [place] Pgr: [facility]  Pulmonary and [facility] Pager: [phone]  04/02/2014, 4:52 PM

## 2014-03-20 ENCOUNTER — Encounter: Payer: Self-pay | Admitting: Internal Medicine

## 2014-03-20 ENCOUNTER — Inpatient Hospital Stay (HOSPITAL_COMMUNITY): Payer: Medicare Other

## 2014-03-20 DIAGNOSIS — R531 Weakness: Secondary | ICD-10-CM | POA: Insufficient documentation

## 2014-03-20 LAB — INFLUENZA PANEL BY PCR (TYPE A & B)
H1N1 flu by pcr: NOT DETECTED
INFLBPCR: NEGATIVE
Influenza A By PCR: NEGATIVE

## 2014-03-20 LAB — URINE CULTURE: Colony Count: >=100000

## 2014-03-21 ENCOUNTER — Other Ambulatory Visit: Payer: Medicare Other

## 2014-03-21 ENCOUNTER — Ambulatory Visit: Payer: Medicare Other | Admitting: Hematology and Oncology

## 2014-03-22 LAB — WOUND CULTURE: Gram Stain: NONE SEEN

## 2014-03-25 LAB — CULTURE, BLOOD (ROUTINE X 2)
CULTURE: NO GROWTH
Culture: NO GROWTH

## 2014-04-05 NOTE — Discharge Summary (Addendum)
NAMEGREYSYN, Lane NO.:  [id number]  MEDICAL RECORD NO.:  [id number]  LOCATION:                                 FACILITY:  PHYSICIAN:  [name], MD DATE OF BIRTH:  December 22, 1950  DATE OF ADMISSION:  04/02/2014 DATE OF DISCHARGE:  04/12/14                              DISCHARGE SUMMARY   This is a 65 year old male with months prior in a wheelchair with peripheral vascular disease, polycythemia vera, now with weakness of his arms and bulbar dysfunction on presentation to the emergency room from the nursing home with concerns of secretions leading to respiratory failure.  The patient was in the emergency room.  I was consulted as Critical Care evaluated the patient and took a very long history from both of his children that he has been really suffering from multitude of comorbidities including myeloproliferative disorder, chronic neck pain, OSA, allergies, essential hypertension, polycythemia vera, CAD, and is wheelchair-dependent over the couple of months, degenerative disk disease with a very very poor quality of life.  I was concerned about a primary neurologic disorder with the differential diagnosis including even Guillain-Barre or even a brainstem infarct related to his polycythemia, and I consulted Neurology, Dr. Roland Rack was kind enough to see the patient.  MRI of the brain and C-spine were ordered.  We had a long discussion with the family and the patient, where he decided would not want to be intubated under any circumstances. He was not a candidate for BiPAP secondary to poor secretion control and the patient was admitted with aggressive medical therapy, but was at a full no code blue.  Unfortunately, the patient continued to decline from his bulbar dysfunction and secretions and comfort care was initiated, and the patient expired.  FINAL DIAGNOSIS ON DEATH: 1. Progressive weakness and dysphagia in the setting of a previous  neuropathy.  To rule out Guillain-Barre syndrome with bulbar     involvement superimposed on a chronic neuropathy to rule out     brainstem infarct and chronic neuropathy. 2. Polycythemia vera. 3. Acute respiratory failure. 4. Rule out aspiration pneumonia. 5. Peripheral vascular disease. 6. Functional quadriplegia     [name], MD     DJF/MEDQ  D:  04/04/2014  T:  04/04/2014  Job:  [id number]

## 2014-04-15 NOTE — Progress Notes (Signed)
Place Resp rate decreasing with periods of apnea, HR decreased to 40's. Pt unresponsive and pupils dilated. Called Elink and called son Heath Lark updates given. Encouraged son to come to bedside.

## 2014-04-15 NOTE — H&P (Signed)
Spoke with [place] [facility] at [phone] spoke with becky hannold. States pt was full release. Ref # [id number]

## 2014-04-15 NOTE — Progress Notes (Signed)
[place] [facility] forms filled out except for funeral home. Pt's family does not which one they are going to use. Forms given to Network engineer and she stated she would fill out info in epic. Pt's family given support and off floor.

## 2014-04-15 NOTE — Progress Notes (Signed)
X ray on floor to perform chest xray. Pt's family stated they didn't think xray was necessary since pt is actively dying. Informed x ray tech and stated she would come back after i spoke with md.

## 2014-04-15 NOTE — Progress Notes (Signed)
md stated ok cancel chest xray. Xray called to inform of this.

## 2014-04-15 NOTE — Progress Notes (Signed)
Pt with asystole on the monitor. Apical pulse not obtained. 2nd RN at bedside to verify no pulse. Charge nurse, critical care and central monitoring informed of pt's status. Pt's family at bedside and informed of pt's status. Support given to family.

## 2014-04-15 NOTE — Progress Notes (Signed)
CSW received referral that pt admitted from [facility].  CSW reviewed chart and noted that pt expired.   No social work needs identified at this time.  CSW signing off.   Alison Murray, MSW, [place] [employer] [phone]

## 2014-04-15 NOTE — Progress Notes (Signed)
Post mortem care done with 2nd RN. Report given. RN stated she would get pt's down to morgue.

## 2014-04-15 NOTE — Assessment & Plan Note (Signed)
Reported as generalized, pt couldn't roll over in bed but closer questions pt using L extremity less than prior today with usual R side weakness; exam confirm

## 2014-04-15 NOTE — Assessment & Plan Note (Signed)
New onset from yesterday to today by nursing; inability to swallow pills and water noted with meds at around 9am;ST was consulted and pt completely failed swallowing test

## 2014-04-15 DEATH — deceased

## 2015-05-26 IMAGING — CT CT HEAD W/O CM
1 series · 16 of 30 positions shown, 20 images · non-contrast
Comparison: 01/17/2014

CLINICAL DATA: Slurred speech, slight confusion, recent fall 3
weeks ago, hx of HTN

EXAM:
CT HEAD WITHOUT CONTRAST
TECHNIQUE: Contiguous axial images were obtained from the base of the skull
through the vertex without intravenous contrast.

[Series 2: head_seq 4.5 h37s st · axial · 0.43mm/px · z∈[-132,+12]mm · 16 of 36 slices shown, 20 images]
[im 2/36  brain]
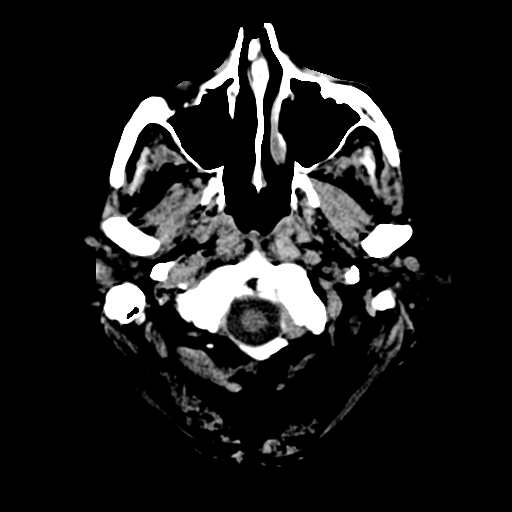
[im 2/36  bone]
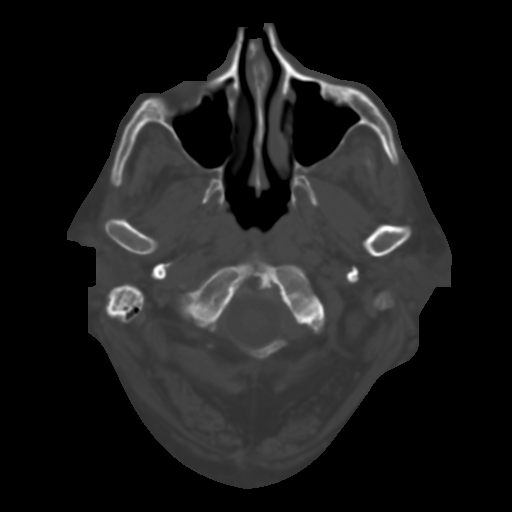
[im 4/36  brain]
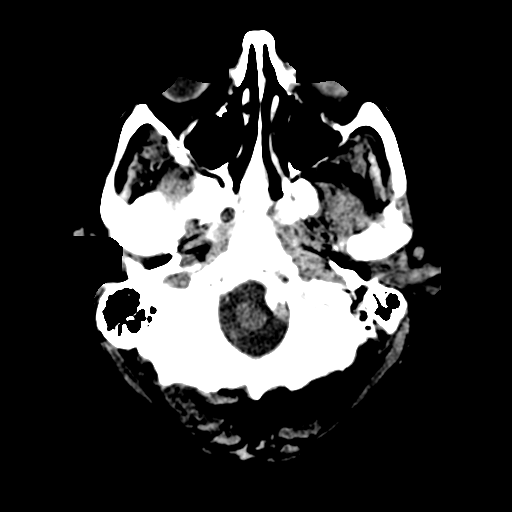
[im 7/36  brain]
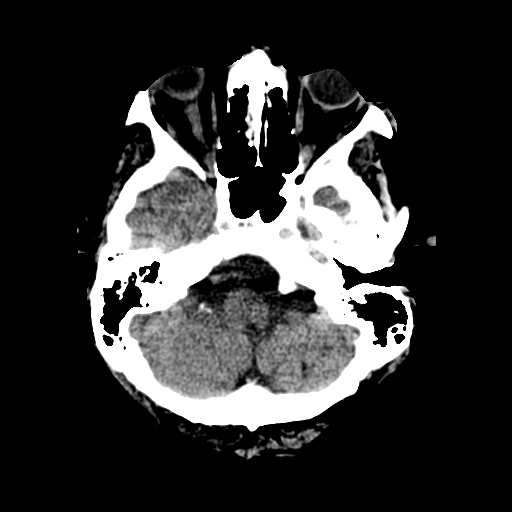
[im 9/36  brain]
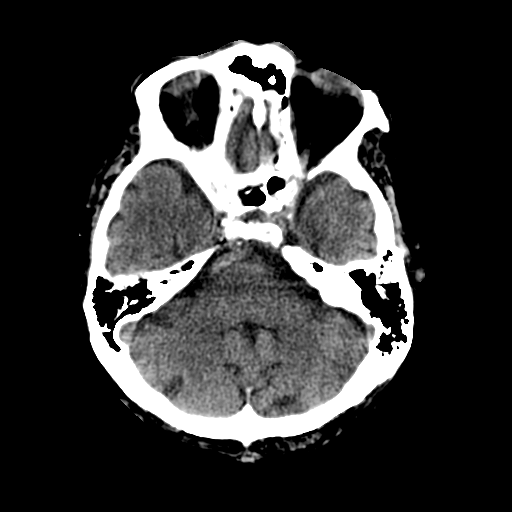
[im 10/36  brain]
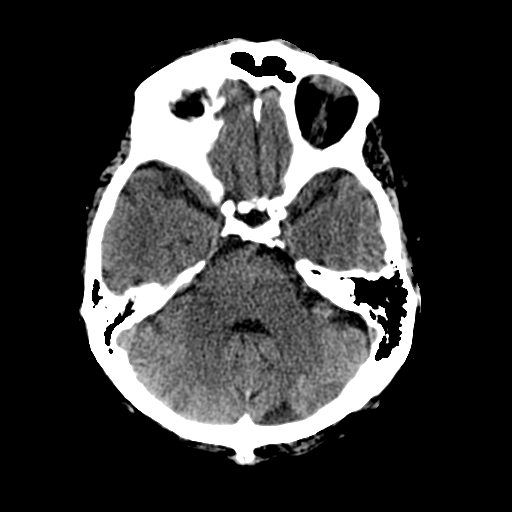
[im 10/36  bone]
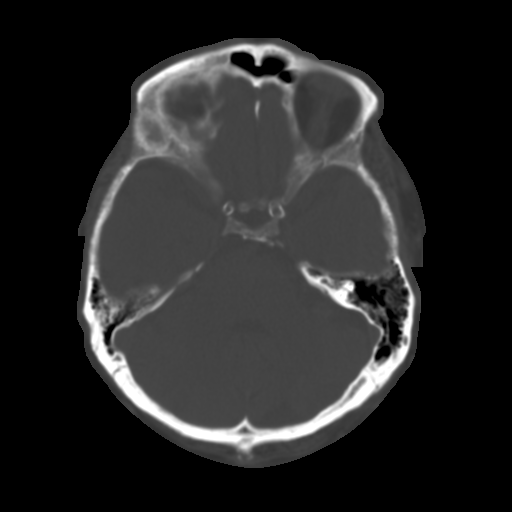
[im 13/36  brain]
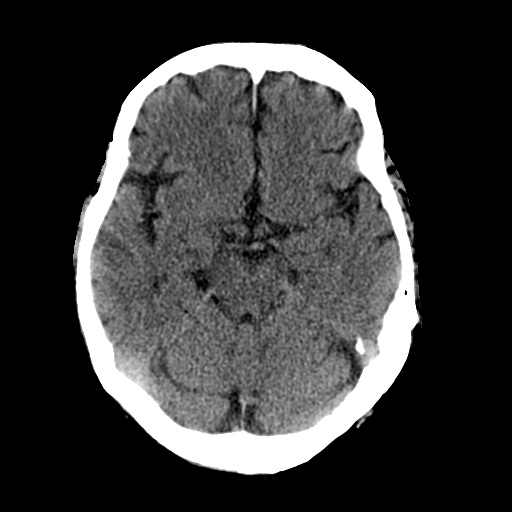
[im 15/36  brain]
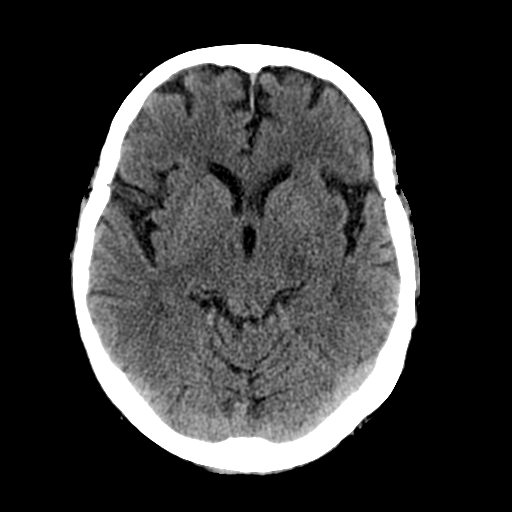
[im 17/36  brain]
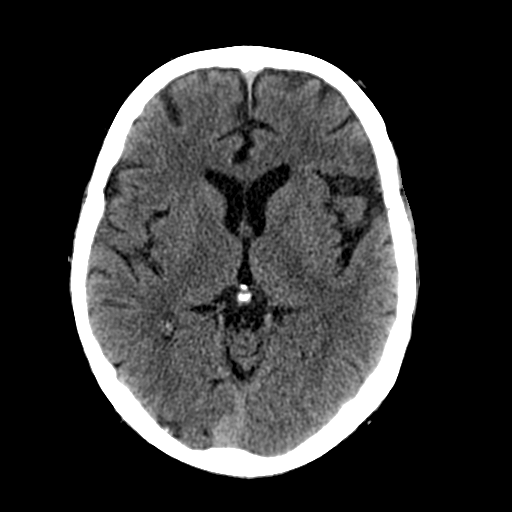
[im 19/36  brain]
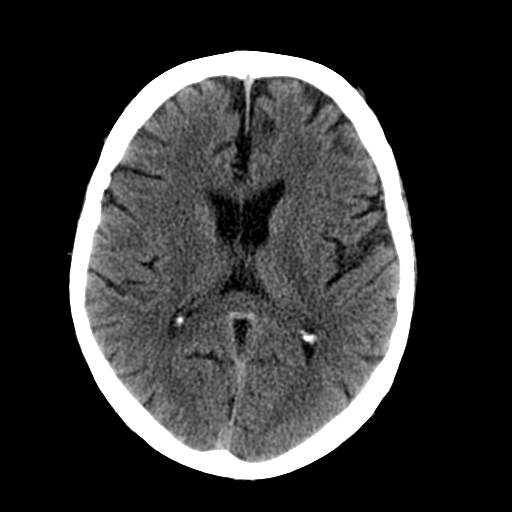
[im 19/36  bone]
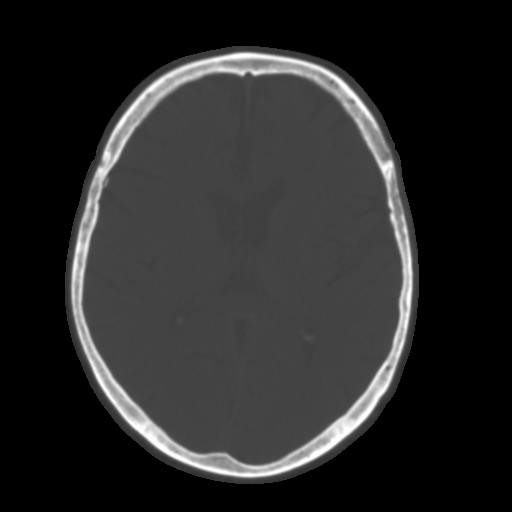
[im 21/36  brain]
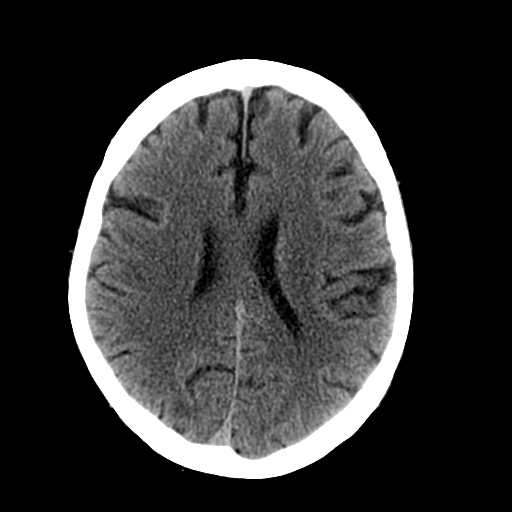
[im 23/36  brain]
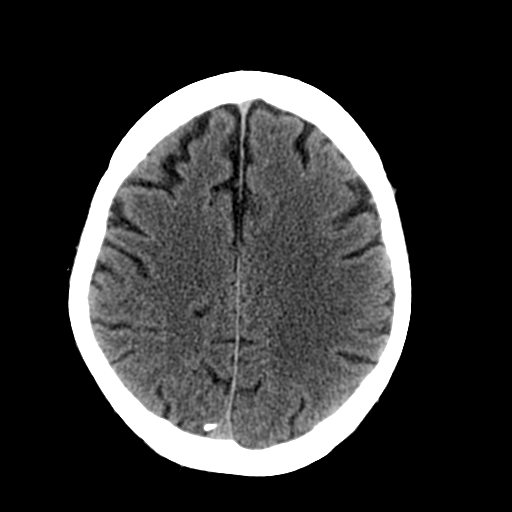
[im 26/36  brain]
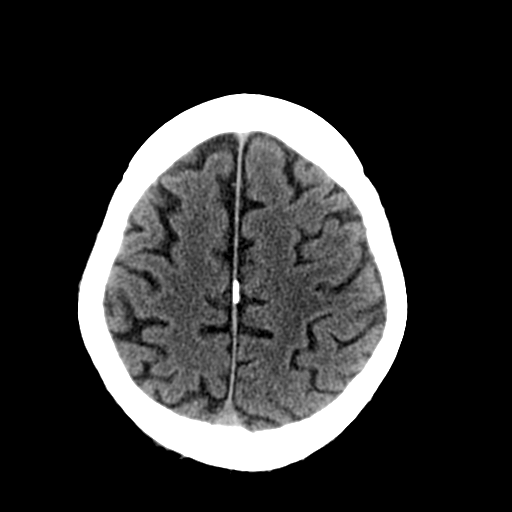
[im 27/36  brain]
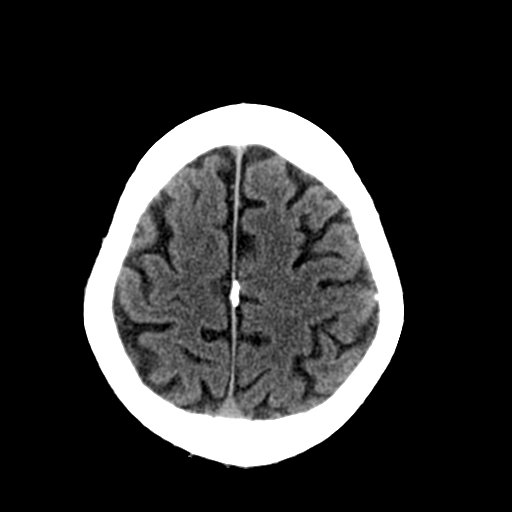
[im 27/36  bone]
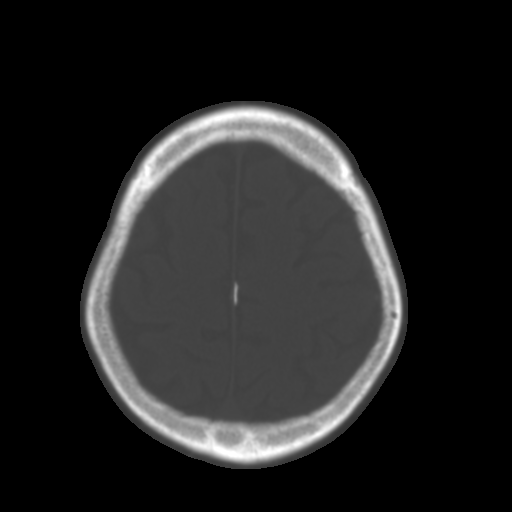
[im 29/36  brain]
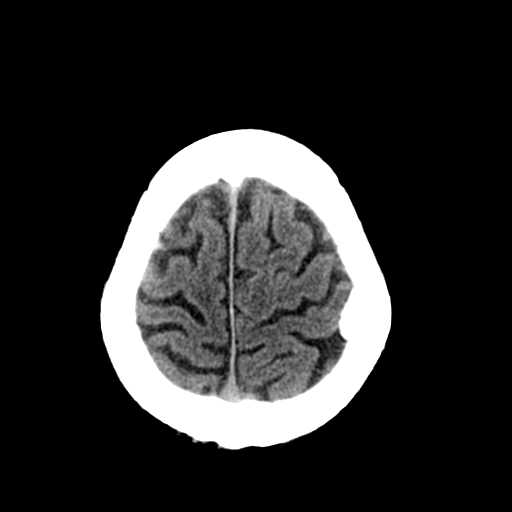
[im 32/36  brain]
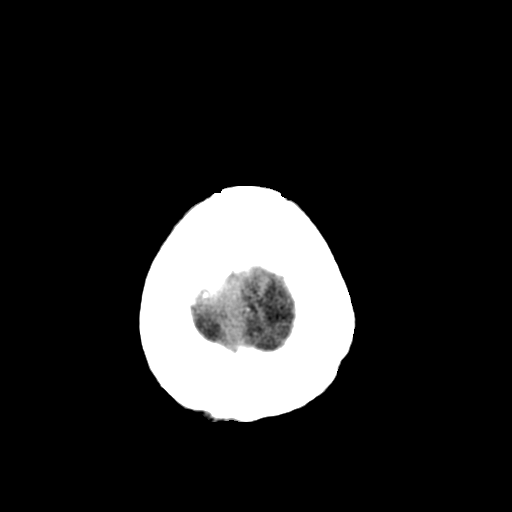
[im 34/36  brain]
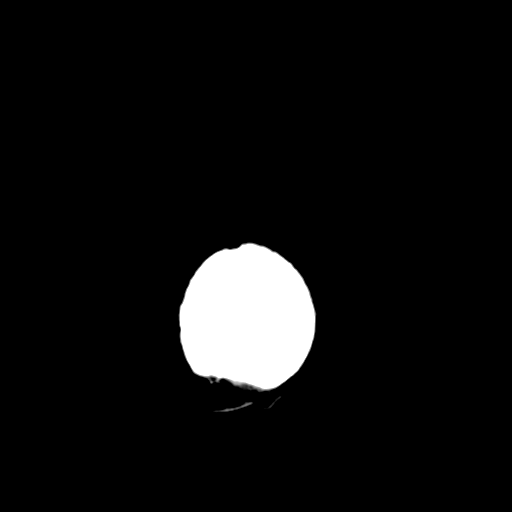

[16 of 30 positions shown; findings below may reference images not displayed]

FINDINGS: Atherosclerotic and physiologic intracranial calcifications. Mild
atrophy. There is no evidence of acute intracranial hemorrhage,
brain edema, mass lesion, acute infarction, mass effect, or midline
shift. Acute infarct may be inapparent on noncontrast CT. No other
intra-axial abnormalities are seen, and the ventricles and sulci are
within normal limits in size and symmetry. No abnormal extra-axial
fluid collections or masses are identified. No significant calvarial
abnormality.
IMPRESSION: 1. Negative for bleed or other acute intracranial process.

## 2015-06-15 IMAGING — CR DG CHEST 2V
2 series · 2 of 2 positions shown · non-contrast
Comparison: 11/20/2013

CLINICAL DATA: Cough, shortness of breath for 3 days

EXAM:
CHEST  2 VIEW

[w chest lat]
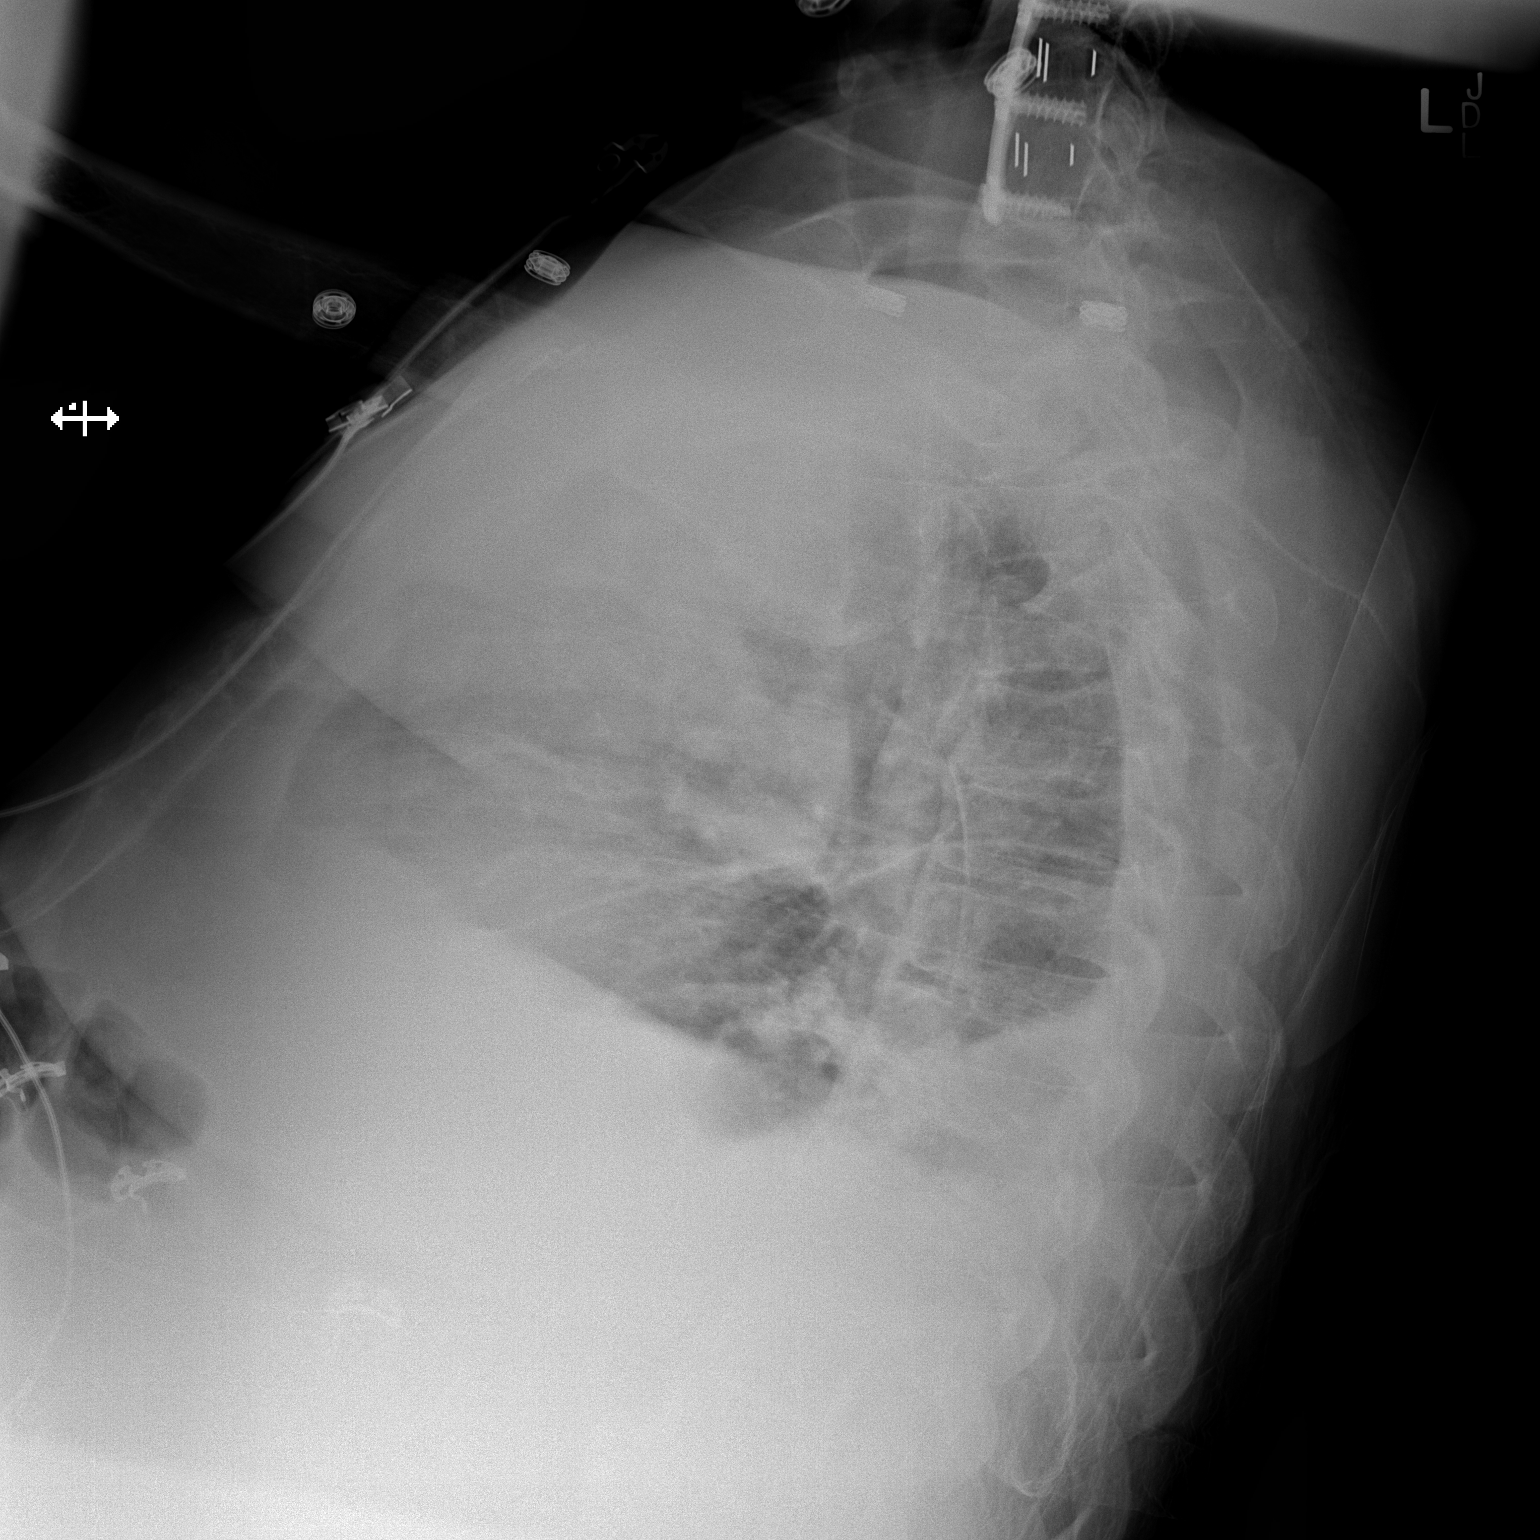

[x chest ap]
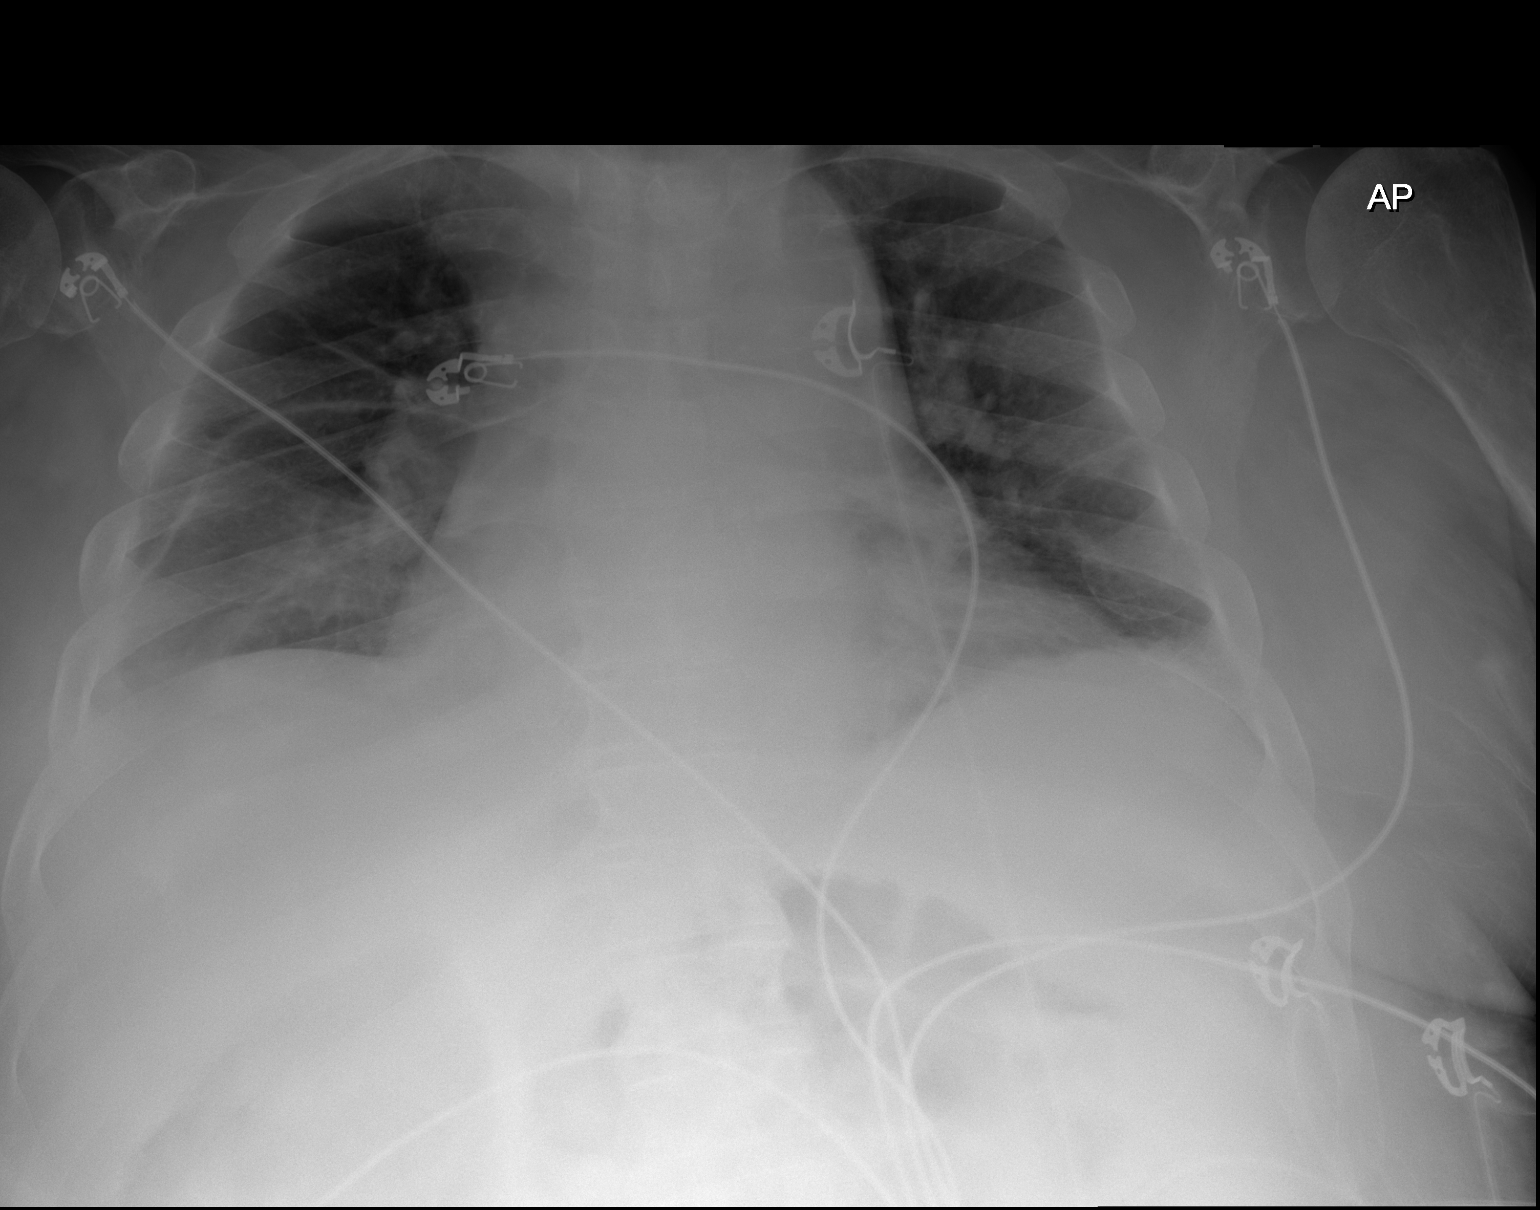

[2 of 2 positions shown; findings below may reference images not displayed]

FINDINGS: Cardiomegaly again noted. There is small right pleural effusion with
right basilar atelectasis or infiltrate. No pulmonary edema.
IMPRESSION: Cardiomegaly. Small right pleural effusion with right basilar
posterior atelectasis or infiltrate.
